# Patient Record
Sex: Male | Born: 1965 | Race: White | Hispanic: No | Smoking: Never smoker
Health system: Southern US, Community
[De-identification: ages and names within clinical notes are randomized; demographics above are authoritative.]

## PROBLEM LIST (undated history)

## (undated) DIAGNOSIS — Z20828 Contact with and (suspected) exposure to other viral communicable diseases: Secondary | ICD-10-CM

## (undated) DIAGNOSIS — I10 Essential (primary) hypertension: Secondary | ICD-10-CM

## (undated) DIAGNOSIS — L0291 Cutaneous abscess, unspecified: Secondary | ICD-10-CM

## (undated) DIAGNOSIS — E785 Hyperlipidemia, unspecified: Secondary | ICD-10-CM

## (undated) DIAGNOSIS — E119 Type 2 diabetes mellitus without complications: Secondary | ICD-10-CM

## (undated) DIAGNOSIS — R011 Cardiac murmur, unspecified: Secondary | ICD-10-CM

## (undated) HISTORY — PX: WRIST SURGERY: SHX841

## (undated) HISTORY — DX: Hyperlipidemia, unspecified: E78.5

## (undated) HISTORY — DX: Cutaneous abscess, unspecified: L02.91

## (undated) HISTORY — DX: Essential (primary) hypertension: I10

## (undated) HISTORY — DX: Contact with and (suspected) exposure to other viral communicable diseases: Z20.828

---

## 2015-10-03 ENCOUNTER — Encounter (HOSPITAL_COMMUNITY): Payer: Self-pay | Admitting: *Deleted

## 2015-10-03 ENCOUNTER — Emergency Department (HOSPITAL_COMMUNITY)
Admission: EM | Admit: 2015-10-03 | Discharge: 2015-10-03 | Disposition: A | Discharge status: Home / Self Care | Payer: Self-pay | Attending: Emergency Medicine | Admitting: Emergency Medicine

## 2015-10-03 DIAGNOSIS — B029 Zoster without complications: Secondary | ICD-10-CM | POA: Insufficient documentation

## 2015-10-03 DIAGNOSIS — M542 Cervicalgia: Secondary | ICD-10-CM | POA: Insufficient documentation

## 2015-10-03 MED ORDER — OXYCODONE-ACETAMINOPHEN 5-325 MG PO TABS: 1.0000 | ORAL_TABLET | Freq: Four times a day (QID) | ORAL | Status: DC | PRN

## 2015-10-03 MED ORDER — ACYCLOVIR 800 MG PO TABS: 800.0000 mg | ORAL_TABLET | Freq: Every day | ORAL | Status: DC

## 2015-10-03 NOTE — Discharge Instructions (Signed)

## 2015-10-03 NOTE — ED Notes (Signed)
PT reports  Out break started several day ago. Located on posterior neck.

## 2015-10-03 NOTE — ED Provider Notes (Signed)
CSN: [id number]     Arrival date & time 10/03/15  [id number] History  By signing my name below, I, Placido SouLogan Joldersma, attest that this documentation has been prepared under the direction and in the presence of Teressa LowerVrinda Caedon Bond, NP. Electronically Signed: Placido SouLogan Joldersma, ED Scribe. 10/03/2015. 10:15 AM.   Chief Complaint  Patient presents with  . Herpes Zoster   The history is provided by the patient and the spouse. No language interpreter was used.    HPI Comments: Jeremy Padilla is a 49 y.o. male who presents to the Emergency Department complaining of a worsening point of swelling and irritation to his posterior scalp with onset a few days ago. He notes some associated, moderate, pain to the affected region that radiates down his neck that worsens with palpation. Pt also notes some associated trouble sleeping due to the pain. He notes applying topical calamine lotion which has provided little relief. Pt notes that he hasn't been to the doctor for a long time and is unsure of any other known medical conditions. He denies any other associated symptoms.   No past medical history on file. No past surgical history on file. No family history on file. Social History  Substance Use Topics  . Smoking status: Not on file  . Smokeless tobacco: Not on file  . Alcohol Use: Not on file    Review of Systems  Musculoskeletal: Positive for myalgias and neck pain.  Skin: Positive for color change and rash.  Psychiatric/Behavioral: Positive for sleep disturbance.  All other systems reviewed and are negative.  Allergies  Review of patient's allergies indicates not on file.  Home Medications   Prior to Admission medications   Not on File   There were no vitals taken for this visit. Physical Exam  Constitutional: He is oriented to person, place, and time. He appears well-developed and well-nourished.  HENT:  Head: Normocephalic and atraumatic.  Mouth/Throat: No oropharyngeal exudate.  Neck:  Normal range of motion. No tracheal deviation present.  Cardiovascular: Normal rate.   Pulmonary/Chest: Effort normal and breath sounds normal. No respiratory distress.  Abdominal: Soft. There is no tenderness.  Musculoskeletal: Normal range of motion.  Neurological: He is alert and oriented to person, place, and time. He exhibits normal muscle tone. Coordination normal.  Skin: He is not diaphoretic.  Erythematous patch with vesicles noted to the left neck. Full rom of neck  Psychiatric: He has a normal mood and affect. His behavior is normal.  Nursing note and vitals reviewed.  ED Course  Procedures  COORDINATION OF CARE: 10:12 AM Pt presents today due to a point of swelling and irritation to his posterior scalp. Discussed treatment plan with pt at bedside. Return precautions noted. Pt agreed to plan.  Labs Review Labs Reviewed - No data to display  Imaging Review No results found.   EKG Interpretation None      MDM   Final diagnoses:  Herpes zoster    Rash consistent with shingles. No neck stiffness noted. No drainage noted. Will given oxycodone and acyclovir. Pt given return precautions  I personally performed the services described in this documentation, which was scribed in my presence. The recorded information has been reviewed and is accurate.    Teressa LowerVrinda Jahson Emanuele, NP 10/03/15 1033  Richardean Canalavid H Yao, MD 10/03/15 1539

## 2015-10-05 ENCOUNTER — Encounter (HOSPITAL_COMMUNITY): Payer: Self-pay | Admitting: Emergency Medicine

## 2015-10-05 ENCOUNTER — Emergency Department (HOSPITAL_COMMUNITY)
Admission: EM | Admit: 2015-10-05 | Discharge: 2015-10-05 | Disposition: A | Discharge status: Home / Self Care | Payer: Self-pay | Attending: Emergency Medicine | Admitting: Emergency Medicine

## 2015-10-05 DIAGNOSIS — R509 Fever, unspecified: Secondary | ICD-10-CM | POA: Insufficient documentation

## 2015-10-05 DIAGNOSIS — L0291 Cutaneous abscess, unspecified: Secondary | ICD-10-CM

## 2015-10-05 DIAGNOSIS — L0211 Cutaneous abscess of neck: Secondary | ICD-10-CM | POA: Insufficient documentation

## 2015-10-05 DIAGNOSIS — Z79899 Other long term (current) drug therapy: Secondary | ICD-10-CM | POA: Insufficient documentation

## 2015-10-05 DIAGNOSIS — R011 Cardiac murmur, unspecified: Secondary | ICD-10-CM | POA: Insufficient documentation

## 2015-10-05 DIAGNOSIS — Z880 Allergy status to penicillin: Secondary | ICD-10-CM | POA: Insufficient documentation

## 2015-10-05 DIAGNOSIS — R110 Nausea: Secondary | ICD-10-CM | POA: Insufficient documentation

## 2015-10-05 DIAGNOSIS — R000 Tachycardia, unspecified: Secondary | ICD-10-CM | POA: Insufficient documentation

## 2015-10-05 DIAGNOSIS — R61 Generalized hyperhidrosis: Secondary | ICD-10-CM | POA: Insufficient documentation

## 2015-10-05 DIAGNOSIS — Z8619 Personal history of other infectious and parasitic diseases: Secondary | ICD-10-CM | POA: Insufficient documentation

## 2015-10-05 HISTORY — DX: Cutaneous abscess, unspecified: L02.91

## 2015-10-05 LAB — CBC WITH DIFFERENTIAL/PLATELET
BASOS ABS: 0 10*3/uL (ref 0.0–0.1)
BASOS PCT: 0 %
EOS PCT: 0 %
Eosinophils Absolute: 0 10*3/uL (ref 0.0–0.7)
HEMATOCRIT: 46.2 % (ref 39.0–52.0)
HEMOGLOBIN: 16.4 g/dL (ref 13.0–17.0)
LYMPHS ABS: 1 10*3/uL (ref 0.7–4.0)
LYMPHS PCT: 6 %
MCH: 29.3 pg (ref 26.0–34.0)
MCHC: 35.5 g/dL (ref 30.0–36.0)
MCV: 82.6 fL (ref 78.0–100.0)
MONO ABS: 0.9 10*3/uL (ref 0.1–1.0)
MONOS PCT: 6 %
Neutro Abs: 15 10*3/uL — ABNORMAL HIGH (ref 1.7–7.7)
Neutrophils Relative %: 88 %
Platelets: 195 10*3/uL (ref 150–400)
RBC: 5.59 MIL/uL (ref 4.22–5.81)
RDW: 12.5 % (ref 11.5–15.5)
WBC: 16.9 10*3/uL — AB (ref 4.0–10.5)

## 2015-10-05 LAB — I-STAT CHEM 8, ED
BUN: 20 mg/dL (ref 6–20)
CHLORIDE: 96 mmol/L — AB (ref 101–111)
CREATININE: 0.8 mg/dL (ref 0.61–1.24)
Calcium, Ion: 1.15 mmol/L (ref 1.12–1.23)
GLUCOSE: 264 mg/dL — AB (ref 65–99)
HEMATOCRIT: 52 % (ref 39.0–52.0)
Hemoglobin: 17.7 g/dL — ABNORMAL HIGH (ref 13.0–17.0)
POTASSIUM: 3.9 mmol/L (ref 3.5–5.1)
Sodium: 135 mmol/L (ref 135–145)
TCO2: 24 mmol/L (ref 0–100)

## 2015-10-05 MED ORDER — LIDOCAINE-EPINEPHRINE 1 %-1:100000 IJ SOLN
10.0000 mL | Freq: Once | INTRAMUSCULAR | Status: AC
Start: 2015-10-05 — End: 2015-10-05
  Administered 2015-10-05: 1 mL via INTRADERMAL
  Filled 2015-10-05: qty 1

## 2015-10-05 MED ORDER — SODIUM CHLORIDE 0.9 % IV BOLUS (SEPSIS)
1000.0000 mL | Freq: Once | INTRAVENOUS | Status: AC
  Administered 2015-10-05: 1000 mL via INTRAVENOUS

## 2015-10-05 MED ORDER — ONDANSETRON 4 MG PO TBDP
8.0000 mg | ORAL_TABLET | Freq: Once | ORAL | Status: AC
  Administered 2015-10-05: 8 mg via ORAL
  Filled 2015-10-05: qty 2

## 2015-10-05 MED ORDER — HYDROMORPHONE HCL 1 MG/ML IJ SOLN
1.0000 mg | Freq: Once | INTRAMUSCULAR | Status: AC
  Administered 2015-10-05: 1 mg via INTRAVENOUS
  Filled 2015-10-05: qty 1

## 2015-10-05 MED ORDER — DOXYCYCLINE HYCLATE 100 MG PO TABS: 100.0000 mg | ORAL_TABLET | Freq: Two times a day (BID) | ORAL | Status: DC

## 2015-10-05 MED ORDER — HYDRALAZINE HCL 20 MG/ML IJ SOLN: 10.0000 mg | Freq: Once | INTRAMUSCULAR | Status: DC

## 2015-10-05 MED ORDER — IBUPROFEN 400 MG PO TABS
600.0000 mg | ORAL_TABLET | Freq: Once | ORAL | Status: AC
  Administered 2015-10-05: 600 mg via ORAL
  Filled 2015-10-05 (×2): qty 1

## 2015-10-05 NOTE — Discharge Instructions (Signed)

## 2015-10-05 NOTE — ED Provider Notes (Signed)
CSN: [id number]     Arrival date & time 10/05/15  [id number] History   First MD Initiated Contact with Patient 10/05/15 [phone]     Chief Complaint  Patient presents with  . Herpes Zoster     (Consider location/radiation/quality/duration/timing/severity/associated sxs/prior Treatment) The history is provided by the patient.   Jeremy Padilla is a 49 yo M in with a rash on the posterior aspect of his neck. He was seen in the emergency department a few days ago and diagnosed with herpes zoster. He was started on pain medicine and an antiviral. His symptoms have not improved and pain is gotten worse. He was febrile to 100.5 this morning, having nausea, and worsening pain. The pain is tender to the touch and 7 out of 10. He denies any travel, tobacco use, alcohol use, or illicit drug use. He denies any shortness of breath, chest pain, abdominal pain, dysuria, constipation, or diarrhea.  History reviewed. No pertinent past medical history. Past Surgical History  Procedure Laterality Date  . Wrist surgery Left    No family history on file. Social History  Substance Use Topics  . Smoking status: Never Smoker   . Smokeless tobacco: Never Used  . Alcohol Use: No    Review of Systems  Constitutional: Positive for fever, chills and diaphoresis.  Respiratory: Negative for shortness of breath.   Cardiovascular: Negative for chest pain.  Gastrointestinal: Negative for nausea, vomiting, abdominal pain, diarrhea and constipation.  Genitourinary: Negative for dysuria.  Musculoskeletal: Positive for neck pain.  Skin: Positive for rash.  Psychiatric/Behavioral: Negative for behavioral problems.      Allergies  Penicillins  Home Medications   Prior to Admission medications   Medication Sig Start Date End Date Taking? Authorizing Provider  acyclovir (ZOVIRAX) 800 MG tablet Take 1 tablet (800 mg total) by mouth 5 (five) times daily. 10/03/15   Teressa LowerVrinda Pickering, NP  doxycycline (VIBRA-TABS) 100 MG tablet  Take 1 tablet (100 mg total) by mouth 2 (two) times daily. For total of 7 days. 10/05/15   Myra RudeJeremy E Hamsa Laurich, MD  oxyCODONE-acetaminophen (PERCOCET/ROXICET) 5-325 MG tablet Take 1-2 tablets by mouth every 6 (six) hours as needed for severe pain. 10/03/15   Teressa LowerVrinda Pickering, NP   BP 192/89 mmHg  Pulse 114  Temp(Src) 98.9 F (37.2 C) (Oral)  Resp 19  SpO2 96% Physical Exam  Constitutional: He is oriented to person, place, and time. He appears well-developed and well-nourished.  HENT:  Head: Normocephalic and atraumatic.  Eyes: Conjunctivae and EOM are normal.  Neck: Normal range of motion.  Cardiovascular: Regular rhythm and intact distal pulses.  Tachycardia present.   Murmur heard. Pulmonary/Chest: Breath sounds normal. No respiratory distress. He has no wheezes.  Abdominal: Soft. Bowel sounds are normal. He exhibits no distension. There is no tenderness.  Musculoskeletal: He exhibits no edema.  Neurological: He is alert and oriented to person, place, and time.  Skin: Skin is warm. Rash noted. There is erythema.     Warmth, erythema and tender to touch on the posterior aspect of the neck. Rash crosses the midline. No vesicles or pustules.     ED Course  Procedures (including critical care time) Labs Review Labs Reviewed  CBC WITH DIFFERENTIAL/PLATELET - Abnormal; Notable for the following:    WBC 16.9 (*)    Neutro Abs 15.0 (*)    All other components within normal limits  I-STAT CHEM 8, ED - Abnormal; Notable for the following:    Chloride 96 (*)  Glucose, Bld 264 (*)    Hemoglobin 17.7 (*)    All other components within normal limits    Imaging Review No results found. I have personally reviewed and evaluated these images and lab results as part of my medical decision-making.   EKG Interpretation None      Medications  lidocaine-EPINEPHrine (XYLOCAINE W/EPI) 1 %-1:100000 (with pres) injection 10 mL (not administered)  sodium chloride 0.9 % bolus 1,000 mL (0 mLs  Intravenous Stopped 10/05/15 0903)  ondansetron (ZOFRAN-ODT) disintegrating tablet 8 mg (8 mg Oral Given 10/05/15 0807)  ibuprofen (ADVIL,MOTRIN) tablet 600 mg (600 mg Oral Given 10/05/15 0807)  HYDROmorphone (DILAUDID) injection 1 mg (1 mg Intravenous Given 10/05/15 0838)   Incision and Drainage Procedure Note: Left posterior neck   The affected area was cleaned and draped in a sterile fashion. Anesthesia was achieved using 5 mL of 1% Lidocaine with epinephrine injected around the wound area using a 27-guage 1 inch needle. An 11-blade scalpel was used to incise the wound. A hemostat was used to break any loculations that were present. Iodoform guaze was used to pack the wound. A sterile dressing was applied to the area. The patient tolerated the procedure well. No complications were encountered.    MDM   Final diagnoses:  Abscess    Jeremy Padilla is a 49 yo M that is presenting with abscess. Incision and drainage was performed. Pain improved with medications. Started doxycyline for 7 days. Given indications for return. Advised to establish care with a primary doctor for improvement and evaluation blood pressure and blood sugar.   Myra Rude, MD PGY-3, [facility] 10/05/2015, 9:50 AM      Myra Rude, MD 10/05/15 2956  Gwyneth Sprout, MD 10/05/15 [phone]

## 2015-10-05 NOTE — ED Notes (Signed)
MD at bedside. 

## 2015-10-05 NOTE — ED Notes (Signed)
Pt reports he was seen in the ED Sunday,  Diagnosed with shingles and placed on medication. Patient reports pain has become so bad it is difficult for him to sleep. Pt has swelling to posterior neck around area of rash.

## 2015-10-05 NOTE — ED Notes (Signed)
Placed patient on monitor

## 2015-10-08 ENCOUNTER — Ambulatory Visit
Admission: RE | Admit: 2015-10-08 | Discharge: 2015-10-08 | Disposition: A | Discharge status: Home / Self Care | Payer: Self-pay | Source: Ambulatory Visit | Attending: Family Medicine | Admitting: Family Medicine

## 2015-10-08 ENCOUNTER — Ambulatory Visit (INDEPENDENT_AMBULATORY_CARE_PROVIDER_SITE_OTHER): Payer: Self-pay | Admitting: Family Medicine

## 2015-10-08 ENCOUNTER — Encounter: Payer: Self-pay | Admitting: Family Medicine

## 2015-10-08 ENCOUNTER — Telehealth: Payer: Self-pay | Admitting: Family Medicine

## 2015-10-08 VITALS — BP 192/98 | HR 86 | Temp 97.6°F | Resp 16 | Wt 186.5 lb

## 2015-10-08 DIAGNOSIS — R739 Hyperglycemia, unspecified: Secondary | ICD-10-CM

## 2015-10-08 DIAGNOSIS — IMO0002 Reserved for concepts with insufficient information to code with codable children: Secondary | ICD-10-CM

## 2015-10-08 DIAGNOSIS — L03221 Cellulitis of neck: Secondary | ICD-10-CM | POA: Insufficient documentation

## 2015-10-08 DIAGNOSIS — L0211 Cutaneous abscess of neck: Secondary | ICD-10-CM

## 2015-10-08 DIAGNOSIS — I10 Essential (primary) hypertension: Secondary | ICD-10-CM

## 2015-10-08 DIAGNOSIS — R011 Cardiac murmur, unspecified: Secondary | ICD-10-CM

## 2015-10-08 DIAGNOSIS — E1159 Type 2 diabetes mellitus with other circulatory complications: Secondary | ICD-10-CM | POA: Insufficient documentation

## 2015-10-08 MED ORDER — ONDANSETRON HCL 4 MG PO TABS: 4.0000 mg | ORAL_TABLET | Freq: Three times a day (TID) | ORAL | Status: DC | PRN

## 2015-10-08 MED ORDER — SULFAMETHOXAZOLE-TRIMETHOPRIM 800-160 MG PO TABS
1.0000 | ORAL_TABLET | Freq: Two times a day (BID) | ORAL | Status: DC
Start: 2015-10-08 — End: 2017-07-18

## 2015-10-08 MED ORDER — LISINOPRIL-HYDROCHLOROTHIAZIDE 20-12.5 MG PO TABS
1.0000 | ORAL_TABLET | Freq: Every day | ORAL | Status: DC
Start: 2015-10-08 — End: 2017-08-03

## 2015-10-08 MED ORDER — METFORMIN HCL 500 MG PO TABS: 500.0000 mg | ORAL_TABLET | Freq: Every day | ORAL | Status: DC

## 2015-10-08 NOTE — Progress Notes (Signed)
Name: Jeremy Padilla   MRN: [id number]    DOB: 04/26/1966   Date:10/08/2015       Progress Note  Subjective  Chief Complaint  Chief Complaint  Patient presents with  . Establish Care    HPI  Jeremy Padilla is a 49 y.o. male here today to transition care of medical needs to a primary care provider. He was seen in the ER 10/05/15 with evidence of a large posterior neck abscess with mild surrounding cellulitis. Significant pain, induration, fluctuance and pointing at the time so I&D was performed. Patient initially diagnosed with shingles in the same area 2 days prior to that and was placed on Acyclovir however it progressed to an abcess. Labs at the time indicated leukocytosis as well as blood sugar elevated at 250 which was nonfasting. He was discharged home with antibiotics and pain medication. His blood pressure was high during his ER visit and again today. No neck imaging was done. Jeremy Padilla reports not having seen a regular doctor in many years. He notes poor wound healing and unexpected weight loss without effort over the years. He is accompanied by his wife today who is also my patient and she reports concern that despite being on Doxycycline 100 mg po bid for 3 days now he is still running temps ranging 99-100F. 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   Active Ambulatory Problems    Diagnosis Date Noted  . Abscess or cellulitis, neck 10/08/2015  . Hyperglycemia 10/08/2015   Resolved Ambulatory Problems    Diagnosis Date Noted  . No Resolved Ambulatory Problems   Past Medical History  Diagnosis Date  . Hypertension   . Hyperlipidemia   . Abscess 10/05/15  . 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 Smoking status: Never Smoker   . Smokeless tobacco: Never Used  . Alcohol Use: No     Current outpatient prescriptions:  .  doxycycline (VIBRA-TABS) 100 MG tablet, Take 1 tablet (100 mg total) by mouth 2 (two) times daily. For total of 7 days.,  Disp: 14 tablet, Rfl: 0 .  oxyCODONE-acetaminophen (PERCOCET/ROXICET) 5-325 MG tablet, Take 1-2 tablets by mouth every 6 (six) hours as needed for severe pain., Disp: 15 tablet, Rfl: 0  Past Surgical History  Procedure Laterality Date  . Wrist surgery Left     Family History  Problem Relation Age of Onset  . Heart disease Father   . Hyperlipidemia Father   . Hypertension Father      Allergies  Allergen Reactions  . Penicillins Rash     Review of Systems  CONSTITUTIONAL: No significant weight changes. Yes fever and fatigue. No chills or mental status change HEENT:  - Eyes: No visual changes.  - Ears: No auditory changes. No pain.  - Nose: No sneezing, congestion, runny nose. - Throat: No sore throat. No changes in swallowing. SKIN: Yes infection. CARDIOVASCULAR: No chest pain, chest pressure or chest discomfort. No palpitations or edema.  RESPIRATORY: No shortness of breath, cough or sputum.  GASTROINTESTINAL: No anorexia, nausea, vomiting. No changes in bowel habits. No abdominal pain or blood.  GENITOURINARY: No dysuria. No frequency. No discharge.  NEUROLOGICAL: No headache, dizziness, syncope, paralysis, ataxia, numbness or tingling in the extremities. No memory changes. No change in bowel or bladder control.  MUSCULOSKELETAL: No joint pain. No muscle pain. HEMATOLOGIC: No anemia, bleeding or bruising.  LYMPHATICS: No enlarged lymph nodes.  PSYCHIATRIC: No change in mood. No change in  sleep pattern.  ENDOCRINOLOGIC: No reports of sweating, cold or heat intolerance. No polyuria or polydipsia.     Objective  BP 192/98 mmHg  Pulse 86  Temp(Src) 97.6 F (36.4 C) (Oral)  Resp 16  Wt 186 lb 8 oz (84.596 kg)  SpO2 98% Body mass index is 23.94 kg/(m^2).  Physical Exam  Constitutional: Patient appears well-developed and well-nourished. In no distress.  HEENT:  - Head: Normocephalic and atraumatic.  - Ears: Bilateral TMs gray, no erythema or effusion - Nose:  Nasal mucosa moist - Mouth/Throat: Oropharynx is clear and moist. No tonsillar hypertrophy or erythema. No post nasal drainage.  - Eyes: Conjunctivae clear, EOM movements normal. PERRLA. No scleral icterus.  Neck: Normal range of motion. Neck supple. No JVD present. No thyromegaly present. POSTERIOR aspect with induration and central purulent drainage with packing in place (about 2 inches of packing removed). Area of induration measures about 4cm bordering mid to left side of his hairline.  Cardiovascular: Normal rate, regular rhythm with 3/6 SEM best heard over right of sternum. Pulmonary/Chest: Effort normal and breath sounds normal. No respiratory distress. Musculoskeletal: Normal range of motion bilateral UE and LE, no joint effusions. Peripheral vascular: Bilateral LE no edema. Neurological: CN II-XII grossly intact with no focal deficits. Alert and oriented to person, place, and time. Coordination, balance, strength, speech and gait are normal.  Skin: Skin is warm and dry. No rash noted. No erythema.  Psychiatric: Patient has a normal mood and affect. Behavior is normal in office today. Judgment and thought content normal in office today.   Recent Results (from the past 2160 hour(s))  CBC with Differential     Status: Abnormal   Collection Time: 10/05/15  7:30 AM  Result Value Ref Range   WBC 16.9 (H) 4.0 - 10.5 K/uL   RBC 5.59 4.22 - 5.81 MIL/uL   Hemoglobin 16.4 13.0 - 17.0 g/dL   HCT 40.9 81.1 - 91.4 %   MCV 82.6 78.0 - 100.0 fL   MCH 29.3 26.0 - 34.0 pg   MCHC 35.5 30.0 - 36.0 g/dL   RDW 78.2 95.6 - 21.3 %   Platelets 195 150 - 400 K/uL   Neutrophils Relative % 88 %   Neutro Abs 15.0 (H) 1.7 - 7.7 K/uL   Lymphocytes Relative 6 %   Lymphs Abs 1.0 0.7 - 4.0 K/uL   Monocytes Relative 6 %   Monocytes Absolute 0.9 0.1 - 1.0 K/uL   Eosinophils Relative 0 %   Eosinophils Absolute 0.0 0.0 - 0.7 K/uL   Basophils Relative 0 %   Basophils Absolute 0.0 0.0 - 0.1 K/uL  I-stat Chem 8,  ED     Status: Abnormal   Collection Time: 10/05/15  8:35 AM  Result Value Ref Range   Sodium 135 135 - 145 mmol/L   Potassium 3.9 3.5 - 5.1 mmol/L   Chloride 96 (L) 101 - 111 mmol/L   BUN 20 6 - 20 mg/dL   Creatinine, Ser 0.86 0.61 - 1.24 mg/dL   Glucose, Bld 578 (H) 65 - 99 mg/dL   Calcium, Ion 4.69 6.29 - 1.23 mmol/L   TCO2 24 0 - 100 mmol/L   Hemoglobin 17.7 (H) 13.0 - 17.0 g/dL   HCT 52.8 41.3 - 24.4 %     Assessment & Plan  1. Abscess or cellulitis, neck Packing removed and further drainage expelled with gentle pressure. Area cleaned with alcohol and dressing replaced with guaze and tape. (No further packing). Complicated abscess  with ongoing drainage however the swelling and pain and redness have come down per patient and wife. I am still concerned for osteomyelitis and sepsis potential. I wanted to culture the wound however we have run out of the appropriate culture swabs within my office setting. We decided on double coverage with Bactrim DS, finish Doxy, stop Acyclovir. C-Spine xray did not show any free are with tissue or bone involvement of infection.  Patient given strict precautions to return to ED if fevers persist or her symptoms worsen. Otherwise he will follow up with me in 3-4 days.   - sulfamethoxazole-trimethoprim (BACTRIM DS,SEPTRA DS) 800-160 MG tablet; Take 1 tablet by mouth 2 (two) times daily.  Dispense: 20 tablet; Refill: 0 - ondansetron (ZOFRAN) 4 mg tablet, take 1-2 tablet every 8hrs prn: 30 tablet; Refill: 1 - DG Cervical Spine Complete; Future  2. Hyperglycemia New diagnosis. Although his hyperglycemia could be due to infection I suspect he has an underlying undiagnosed DM II which has led to abscess formation. Plan to start him on Metformin 500 mg po qday and will reassess with HBA1C once abscess resolved.  - metFORMIN (GLUCOPHAGE) 500 MG tablet; Take 1 tablet (500 mg total) by mouth daily.  Dispense: 30 tablet; Refill: 1  3. Hypertension goal BP (blood  pressure) < 140/90 New diagnosis. Very high asymptomatic blood pressure although his cardiac murmur is likely related to his uncontrolled HTN. Clinically no evidence of HTN Urgency. Plan to start him on Zestoretic 20-12.5mg  one a day and will likely need higher doses near future.  - lisinopril-hydrochlorothiazide (ZESTORETIC) 20-12.5 MG tablet; Take 1 tablet by mouth daily.  Dispense: 30 tablet; Refill: 1  4. Heart murmur New finding. Will need stress testing and echo near future.

## 2015-10-08 NOTE — Telephone Encounter (Signed)
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

## 2015-10-08 NOTE — Telephone Encounter (Signed)
Patient is checking status on his xray results

## 2015-10-12 ENCOUNTER — Ambulatory Visit: Payer: Self-pay | Admitting: Family Medicine

## 2017-07-18 ENCOUNTER — Emergency Department: Payer: Self-pay

## 2017-07-18 ENCOUNTER — Encounter: Payer: Self-pay | Admitting: Emergency Medicine

## 2017-07-18 ENCOUNTER — Emergency Department
Admission: EM | Admit: 2017-07-18 | Discharge: 2017-07-18 | Disposition: A | Discharge status: Home / Self Care | Payer: Self-pay | Attending: Emergency Medicine | Admitting: Emergency Medicine

## 2017-07-18 ENCOUNTER — Other Ambulatory Visit: Payer: Self-pay

## 2017-07-18 DIAGNOSIS — I10 Essential (primary) hypertension: Secondary | ICD-10-CM | POA: Insufficient documentation

## 2017-07-18 DIAGNOSIS — Z7984 Long term (current) use of oral hypoglycemic drugs: Secondary | ICD-10-CM | POA: Insufficient documentation

## 2017-07-18 DIAGNOSIS — E119 Type 2 diabetes mellitus without complications: Secondary | ICD-10-CM | POA: Insufficient documentation

## 2017-07-18 DIAGNOSIS — R4182 Altered mental status, unspecified: Secondary | ICD-10-CM | POA: Insufficient documentation

## 2017-07-18 HISTORY — DX: Type 2 diabetes mellitus without complications: E11.9

## 2017-07-18 LAB — BASIC METABOLIC PANEL
ANION GAP: 9 (ref 5–15)
BUN: 36 mg/dL — ABNORMAL HIGH (ref 6–20)
CALCIUM: 10.2 mg/dL (ref 8.9–10.3)
CO2: 30 mmol/L (ref 22–32)
CREATININE: 1.18 mg/dL (ref 0.61–1.24)
Chloride: 97 mmol/L — ABNORMAL LOW (ref 101–111)
GFR calc non Af Amer: >60 mL/min (ref >60)
Glucose, Bld: 222 mg/dL — ABNORMAL HIGH (ref 65–99)
Potassium: 4.5 mmol/L (ref 3.5–5.1)
SODIUM: 136 mmol/L (ref 135–145)

## 2017-07-18 LAB — CBC
HCT: 50.2 % (ref 40.0–52.0)
HEMOGLOBIN: 17.3 g/dL (ref 13.0–18.0)
MCH: 28.9 pg (ref 26.0–34.0)
MCHC: 34.4 g/dL (ref 32.0–36.0)
MCV: 84.1 fL (ref 80.0–100.0)
PLATELETS: 220 10*3/uL (ref 150–440)
RBC: 5.97 MIL/uL — AB (ref 4.40–5.90)
RDW: 13.5 % (ref 11.5–14.5)
WBC: 10 10*3/uL (ref 3.8–10.6)

## 2017-07-18 LAB — TROPONIN I
TROPONIN I: 0.07 ng/mL — AB (ref <0.03)
Troponin I: 0.07 ng/mL (ref <0.03)

## 2017-07-18 NOTE — ED Provider Notes (Signed)
[facility] Emergency Department Provider Note ____________________________________________   I have reviewed the triage vital signs and the triage nursing note.  HISTORY  Chief Complaint Hypertension   Historian Patient  HPI Jeremy Padilla is a 51 y.o. male presents for evaluation of elevated blood pressures. Patient states he had been off of his diabetic and blood pressure medications for a while and restart about 8 days ago. [facility] follows [facility]. He is currently taking lisinopril 20/12.5 HCTZ. He is also taking metformin 500 mg once at night once in the morning.  Patient states that on Monday, 2 days ago he was feeling fine and then he went on the morning and started to feel lightheaded and dizzy and a little confused. He apparently went home and is wife thought that he wasn't quite acting right and his blood pressure has pressure was elevated at 200 over 100s. They did not have a way to check his blood sugar.  It sounds like his symptoms lasted a couple of hours and then resolved. Patient denied slurred speech or trouble finding words, or any focal weakness or numbness. He denied specific coordination problems but felt like he was just fatigued all over.  Denies chest pain. No exertional dyspnea.  Patient states he was supposed to have a primary doctor appointment today but was told to come to the ER due to elevated blood pressures.  He is currently asymptomatic.    Past Medical History:  Diagnosis Date  . Abscess 10/05/15  . Diabetes mellitus without complication (HCC)   . 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 Hyperlipidemia   . Hypertension     Patient Active Problem List   Diagnosis Date Noted  . Abscess or cellulitis, neck 10/08/2015  . Hyperglycemia 10/08/2015  . Hypertension goal BP (blood pressure) < 140/90 10/08/2015  . Heart murmur 10/08/2015    Past Surgical History:  Procedure Laterality Date  . WRIST SURGERY Left     Prior to  Admission medications   Medication Sig Start Date End Date Taking? Authorizing Provider  lisinopril-hydrochlorothiazide (ZESTORETIC) 20-12.5 MG tablet Take 1 tablet by mouth daily. 10/08/15  Yes Edwena FeltySundaram, Ashany, MD  metFORMIN (GLUCOPHAGE) 500 MG tablet Take 1 tablet (500 mg total) by mouth daily. 10/08/15  Yes Edwena FeltySundaram, Ashany, MD  ondansetron (ZOFRAN) 4 MG tablet Take 1-2 tablets (4-8 mg total) by mouth every 8 (eight) hours as needed for nausea or vomiting. Patient not taking: Reported on 07/18/2017 10/08/15   Edwena FeltySundaram, Ashany, MD  oxyCODONE-acetaminophen (PERCOCET/ROXICET) 5-325 MG tablet Take 1-2 tablets by mouth every 6 (six) hours as needed for severe pain. Patient not taking: Reported on 07/18/2017 10/03/15   Teressa LowerPickering, Vrinda, NP    Allergies  Allergen Reactions  . Penicillins Rash    .Has patient had a PCN reaction causing immediate rash, facial/tongue/throat swelling, SOB or lightheadedness with hypotension: Unknown Has patient had a PCN reaction causing severe rash involving mucus membranes or skin necrosis: Unknown Has patient had a PCN reaction that required hospitalization: Unknown Has patient had a PCN reaction occurring within the last 10 years: Unknown If all of the above answers are "NO", then may proceed with Cephalosporin use.     Family History  Problem Relation Age of Onset  . Heart disease Father   . Hyperlipidemia Father   . Hypertension Father     Social History Social History  Substance Use Topics  . Smoking status: Never Smoker  . Smokeless tobacco: Never Used  . Alcohol use No  Review of Systems  Constitutional: Negative for fever. Eyes: Negative for visual changes. ENT: Negative for sore throat. Cardiovascular: Negative for chest pain. Respiratory: Negative for shortness of breath. Gastrointestinal: Negative for abdominal pain, vomiting and diarrhea. Genitourinary: Negative for dysuria. Musculoskeletal: Negative for back pain. Skin: Negative  for rash. Neurological: Negative for headache.  As per history of present illness, somewhat lightheaded and an episode of confusion or altered mental status for couple of hours on Monday, 2 days ago.  ____________________________________________   PHYSICAL EXAM:  VITAL SIGNS: ED Triage Vitals  Enc Vitals Group     BP 07/18/17 1224 (!) 206/112     Pulse Rate 07/18/17 1224 99     Resp 07/18/17 1224 16     Temp 07/18/17 1224 98 F (36.7 C)     Temp Source 07/18/17 1224 Oral     SpO2 07/18/17 1224 100 %     Weight 07/18/17 1223 200 lb (90.7 kg)     Height 07/18/17 1223 6\' 2"  (1.88 m)     Head Circumference --      Peak Flow --      Pain Score --      Pain Loc --      Pain Edu? --      Excl. in GC? --      Constitutional: Alert and oriented. Well appearing and in no distress. HEENT   Head: Normocephalic and atraumatic.      Eyes: Conjunctivae are normal. Pupils equal and round.       Ears:         Nose: No congestion/rhinnorhea.   Mouth/Throat: Mucous membranes are moist.   Neck: No stridor. Cardiovascular/Chest: Normal rate, regular rhythm.  No murmurs, rubs, or gallops. Respiratory: Normal respiratory effort without tachypnea nor retractions. Breath sounds are clear and equal bilaterally. No wheezes/rales/rhonchi. Gastrointestinal: Soft. No distention, no guarding, no rebound. Nontender.    Genitourinary/rectal:Deferred Musculoskeletal: Nontender with normal range of motion in all extremities. No joint effusions.  No lower extremity tenderness.  No edema. Neurologic:  Normal speech and language. No gross or focal neurologic deficits are appreciated. Skin:  Skin is warm, dry and intact. No rash noted. Psychiatric: Mood and affect are normal. Speech and behavior are normal. Patient exhibits appropriate insight and judgment.   ____________________________________________  LABS (pertinent positives/negatives)  Labs Reviewed  BASIC METABOLIC PANEL - Abnormal;  Notable for the following:       Result Value   Chloride 97 (*)    Glucose, Bld 222 (*)    BUN 36 (*)    All other components within normal limits  CBC - Abnormal; Notable for the following:    RBC 5.97 (*)    All other components within normal limits  TROPONIN I - Abnormal; Notable for the following:    Troponin I 0.07 (*)    All other components within normal limits  TROPONIN I - Abnormal; Notable for the following:    Troponin I 0.07 (*)    All other components within normal limits  TROPONIN I    ____________________________________________    EKG I, Governor Rooks, MD, the attending physician have personally viewed and interpreted all ECGs.  92 bpm. Normal sinus rhythm. Left axis deviation. LVH. Q waves septally. T waves inverted laterally with mild ST segment depression. ST segment elevation in V1 and V2 appears to be J-point elevation.  Repeat EKG: 77 bpm. Normal sinus rhythm. Narrow QRS. Normal axis. LVH with lateral changes. ST segment elevation in  V1 and V2. To be J-point elevation. ____________________________________________  RADIOLOGY All Xrays were viewed by me. Imaging interpreted by Radiologist.  Chest x-ray two-view: No edema or consolidation.   CT head without contrast: IMPRESSION: 1.  No evidence for acute intracranial abnormality. 2. 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  __________________________________________  PROCEDURES  Procedure(s) performed: None  Critical Care performed: None  ____________________________________________   ED COURSE / ASSESSMENT AND PLAN  Pertinent labs & imaging results that were available during my care of the patient were reviewed by me and considered in my medical decision making (see chart for details).   Patient is currently a symptomatic, but is describing an episode of several hours of slightly altered in terms of being slightly confused or slow and fatigued and having some  dizziness on Monday.  Although those episodes is nonspecific and could be a number of different presentations from heat exhaustion to hypoglycemia, hypertensive encephalopathy, we discussed the possibility of stroke/TIA.  CT head will be obtained given symptoms greater than 24 hours ago, will rely on CT head for acute findings.  Patient's EKG is nonspecific and has LVH and I think best evaluation appears to be J-point elevation rather than 70.  Troponin came back elevated 0.07. He does have uncontrolled hypertension, and has been on restarted blood pressure and diabetic medications for only about a week now.  Patient was given aspirin. His repeat troponin was reviewed in the computer system after down time with Epic was back online and found to be unchanged at 0.07.  CT head shows not acute, subacute emergent findings.  There are signs of old lacunar infarcts.  I am asking patient to start baby aspirin. I'm asking him to follow-up with cardiology next 1-2 days. Asked him to follow-up with his private care doctor in next 1-2 days to decide whether or not he needs additional evaluation for possible TIA. The episode on Monday although again not a clear TIA, really very nonspecific episode.    CONSULTATIONS:   Cardiology Dr. Okey Dupre, discussed risk factors and ECG and troponin unchanged, and will recommend close outpatient follow up.   Patient / Family / Caregiver informed of clinical course, medical decision-making process, and agree with plan.   I discussed return precautions, follow-up instructions, and discharge instructions with patient and/or family.  Discharge Instructions : You were evaluated for elevated blood pressure, and I would like him to continue medication and discussed changing medication with your primary care doctor.  It is uncertain what the episode was on Monday, however the rest of your exam and evaluation are overall reassuring in the emergency department today. I do want you  to follow-up with a cardiologist this week, please call tomorrow for an appointment.  I would also like you to see your primary doctor - call tomorrow to consider any additional evaluation for TIA (transient ischemic attack).  Please take 81mg  (baby) aspirin daily.  Return to the emergency room immediately for any chest pain, nausea or sweats, confusion altered mental status, weakness, numbness, or any other symptoms concerning to you.  ___________________________________________   FINAL CLINICAL IMPRESSION(S) / ED DIAGNOSES   Final diagnoses:  Hypertension, unspecified type  Altered mental status, unspecified altered mental status type              Note: This dictation was prepared with Dragon dictation. Any transcriptional errors that result from this process are unintentional    Governor Rooks, MD 07/18/17 [phone]

## 2017-07-18 NOTE — ED Triage Notes (Signed)
C/O elevated blood pressure for more than a week.   Patient states he was started on BP meds and Diabetes medications over a year ago.  Patient had not been taking meds, but restarted taking medications 2 weeks, but blood pressure remains high.  Taking lisinopril 20-12.5 dialy.  And Metformin 500 mg QID.

## 2017-07-18 NOTE — Discharge Instructions (Signed)
You were evaluated for elevated blood pressure, and I would like him to continue medication and discussed changing medication with your primary care doctor.  It is uncertain what the episode was on Monday, however the rest of your exam and evaluation are overall reassuring in the emergency department today. I do want you to follow-up with a cardiologist this week, please call tomorrow for an appointment.  I would also like you to see your primary doctor - call tomorrow to consider any additional evaluation for TIA (transient ischemic attack).  Please take 81mg  (baby) aspirin daily.  Return to the emergency room immediately for any chest pain, nausea or sweats, confusion altered mental status, weakness, numbness, or any other symptoms concerning to you.

## 2017-07-18 NOTE — ED Notes (Signed)
md in with pt.   Pt alert.  D/c inst to pt.

## 2017-07-18 NOTE — ED Notes (Signed)
Pt to CT

## 2017-08-02 DIAGNOSIS — E1139 Type 2 diabetes mellitus with other diabetic ophthalmic complication: Secondary | ICD-10-CM | POA: Insufficient documentation

## 2017-08-02 DIAGNOSIS — E118 Type 2 diabetes mellitus with unspecified complications: Secondary | ICD-10-CM | POA: Insufficient documentation

## 2017-08-02 NOTE — Progress Notes (Signed)
Cardiology Office Note  Date:  08/03/2017   ID:  [name], DOB [birth year], MRN [id number]  PCP:  Patient, No Pcp Per   Chief Complaint  Patient presents with  . other    Follow up from [facility] ER; HTN. Meds reviewed by the pt. verbally. "doing well." Pt. c/o tingling and numbness in feet at times. Denies chest pain or shortness of breath.     HPI: ./l Jeremy Padilla is a 51 y.o. male hx of  Diabetes, poorly controlled for years HTN, poorly controlled  seen in the emergency room last week for confusion found to have severely elevated blood pressure Presents by referral from the emergency room for management of his hypertension  He does not have insurance, No meds for one year at least, off and on over many years Noncompliant in general Poor diet, drinks lots of suite tea, trying to do better  Currently feels well with no complaints  Seen in the ER 07/18/2017 Hospital records reviewed with the patient in detail CT head shows not acute, subacute emergent findings.  There are signs of old lacunar infarcts.  He is seen in [place] to clinic At time of discharge from ER started on lisinopril HCTZ Now back on his metformin  Recently reported feeling lightheaded and dizzy and a little confused. He apparently went home and is wife thought that he wasn't quite acting right and his blood pressure has pressure was elevated at 200 over 100s. They did not have a way to check his blood sugar. It sounds like his symptoms lasted a couple of hours and then resolved. Patient denied slurred speech or trouble finding words, or any focal weakness or numbness.   Denies chest pain. No exertional dyspnea.  On today's visit he is taking lisinopril HCTZ, metformin, Lipitor  EKG personally reviewed by myself on todays visit Shows normal sinus rhythm rate 78 bpm T wave abnormality V5, V6 1 and aVL concerning for ischemia lateral wall  PMH:   has a past medical history of Abscess  (10/05/15); Diabetes mellitus without complication (HCC); Herpes exposure; Hyperlipidemia; and Hypertension.  PSH:    Past Surgical History:  Procedure Laterality Date  . WRIST SURGERY Left     Current Outpatient Prescriptions  Medication Sig Dispense Refill  . aspirin EC 81 MG tablet Take 81 mg by mouth daily.    Marland Kitchen atorvastatin (LIPITOR) 40 MG tablet Take 1 tablet (40 mg total) by mouth daily. 90 tablet 3  . lisinopril-hydrochlorothiazide (PRINZIDE,ZESTORETIC) 20-25 MG tablet Take 1 tablet by mouth daily. 90 tablet 3  . metFORMIN (GLUCOPHAGE) 500 MG tablet Take 1 tablet (500 mg total) by mouth daily. 30 tablet 1  . amLODipine (NORVASC) 10 MG tablet Take 1 tablet (10 mg total) by mouth daily. 90 tablet 4   No current facility-administered medications for this visit.      Allergies:   Penicillins   Social History:  The patient  reports that he has never smoked. He has never used smokeless tobacco. He reports that he does not drink alcohol or use drugs.   Family History:   family history includes Heart disease in his father; Hyperlipidemia in his father; Hypertension in his father.    Review of Systems: Review of Systems  Constitutional: Negative.   Respiratory: Negative.   Cardiovascular: Negative.   Gastrointestinal: Negative.   Musculoskeletal: Negative.   Neurological: Negative.   Psychiatric/Behavioral: Negative.   All other systems reviewed and are negative.    PHYSICAL EXAM:  VS:  BP (!) 160/80 (BP Location: Right Arm, Patient Position: Sitting, Cuff Size: Normal)   Pulse 78   Ht 6\' 2"  (1.88 m)   Wt 192 lb 8 oz (87.3 kg)   BMI 24.72 kg/m  , BMI Body mass index is 24.72 kg/m. GEN: Well nourished, well developed, in no acute distress  HEENT: normal  Neck: no JVD, 1/2 left carotid bruit, no masses Cardiac: RRR; 2/6 SEM RSB, no rubs, or gallops,no edema  Respiratory:  clear to auscultation bilaterally, normal work of breathing GI: soft, nontender, nondistended, +  BS MS: no deformity or atrophy  Skin: warm and dry, no rash Neuro:  Strength and sensation are intact Psych: euthymic mood, full affect    Recent Labs: 07/18/2017: BUN 36; Creatinine, Ser 1.18; Hemoglobin 17.3; Platelets 220; Potassium 4.5; Sodium 136    Lipid Panel No results found for: CHOL, HDL, LDLCALC, TRIG    Wt Readings from Last 3 Encounters:  08/03/17 192 lb 8 oz (87.3 kg)  07/18/17 200 lb (90.7 kg)  10/08/15 186 lb 8 oz (84.6 kg)       ASSESSMENT AND PLAN:  Hypertension goal BP (blood pressure) < 140/90 Stressed importance of aggressive blood pressure control We will start amlodipine 10 mg daily in addition to his lisinopril HCT He will need basic metabolic panel with primary care  Heart murmur - Plan: EKG 12-Lead He does not have insurance, low-grade murmur Recommended when he does get insurance we order echocardiogram  Type 2 diabetes mellitus with complication, without long-term current use of insulin (HCC) Stressed importance of medication compliance In the past has been noncompliant, now with neuropathy in his feet  Bruit of left carotid artery Will need carotid ultrasound He would like to wait as he has no insurance  Lacunar infarction [facility]) CT scan showing old lacunar infarcts Possibly from long history of severe hypertension uncontrolled Ideally needs carotid ultrasound but has no insurance Stressed importance of aggressive blood pressure control  Abnormal EKG EKG concerning for ischemia lateral wall He does not want stress testing at this time, reports that he feels fine He does not have insurance  Disposition:   F/U  6 months   Total encounter time more than 45 minutes  Greater than 50% was spent in counseling and coordination of care with the patient    Orders Placed This Encounter  Procedures  . EKG 12-Lead     Signed, Dossie Arbour, M.D., Ph.D. 08/03/2017  [facility] [place], [place] [phone]

## 2017-08-03 ENCOUNTER — Ambulatory Visit (INDEPENDENT_AMBULATORY_CARE_PROVIDER_SITE_OTHER): Payer: Self-pay | Admitting: Cardiovascular Disease

## 2017-08-03 ENCOUNTER — Encounter: Payer: Self-pay | Admitting: Cardiovascular Disease

## 2017-08-03 VITALS — BP 160/80 | HR 78 | Ht 74.0 in | Wt 192.5 lb

## 2017-08-03 DIAGNOSIS — R0989 Other specified symptoms and signs involving the circulatory and respiratory systems: Secondary | ICD-10-CM

## 2017-08-03 DIAGNOSIS — E118 Type 2 diabetes mellitus with unspecified complications: Secondary | ICD-10-CM

## 2017-08-03 DIAGNOSIS — R9431 Abnormal electrocardiogram [ECG] [EKG]: Secondary | ICD-10-CM

## 2017-08-03 DIAGNOSIS — R011 Cardiac murmur, unspecified: Secondary | ICD-10-CM

## 2017-08-03 DIAGNOSIS — I639 Cerebral infarction, unspecified: Secondary | ICD-10-CM

## 2017-08-03 DIAGNOSIS — I10 Essential (primary) hypertension: Secondary | ICD-10-CM

## 2017-08-03 DIAGNOSIS — I6381 Other cerebral infarction due to occlusion or stenosis of small artery: Secondary | ICD-10-CM

## 2017-08-03 MED ORDER — ATORVASTATIN CALCIUM 40 MG PO TABS: 40.0000 mg | ORAL_TABLET | Freq: Every day | ORAL | 3 refills | Status: DC

## 2017-08-03 MED ORDER — AMLODIPINE BESYLATE 10 MG PO TABS: 10.0000 mg | ORAL_TABLET | Freq: Every day | ORAL | 4 refills | Status: DC

## 2017-08-03 MED ORDER — LISINOPRIL-HYDROCHLOROTHIAZIDE 20-25 MG PO TABS: 1.0000 | ORAL_TABLET | Freq: Every day | ORAL | 3 refills | Status: DC

## 2017-08-03 NOTE — Patient Instructions (Signed)
Medication Instructions:   Please start amlodipine one a day  Ask about : Amlodipine/valsartan/HCTZ combo pill  Labwork:  No new labs needed  Testing/Procedures:  No further testing at this time   Follow-Up: It was a pleasure seeing you in the office today. Please call us if you have new issues that need to be addressed before your next appt.  [phone]  Your physician wants you to follow-up in: 6 months.  You will receive a reminder letter in the mail two months in advance. If you don't receive a letter, please call our office to schedule the follow-up appointment.  If you need a refill on your cardiac medications before your next appointment, please call your pharmacy.

## 2018-07-24 IMAGING — CT CT HEAD W/O CM
3 series · 14 of 47 positions shown, 16 images · non-contrast
Comparison: None.

CLINICAL DATA: Patient C/O elevated blood pressure for more than a
week. Patient states he was started on BP meds and Diabetes
restarted taking medications 2 weeks, but blood pressure remains
high.

EXAM:
CT HEAD WITHOUT CONTRAST
TECHNIQUE: Contiguous axial images were obtained from the base of the skull
through the vertex without intravenous contrast.

[Series 2: head wo · axial · 0.47mm/px · z∈[-135,-10]mm · 8 of 31 slices shown, 10 images]
[im 3/31  brain]
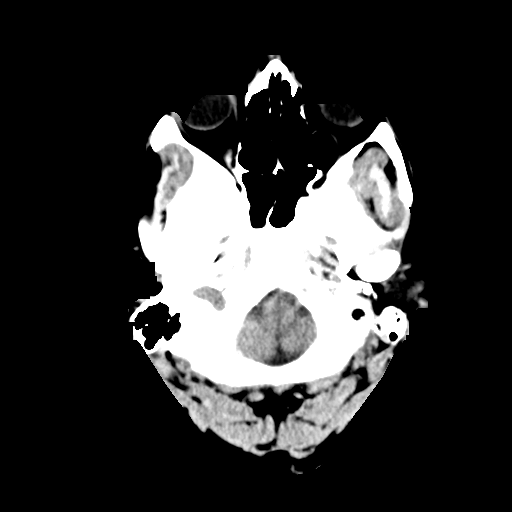
[im 3/31  bone]
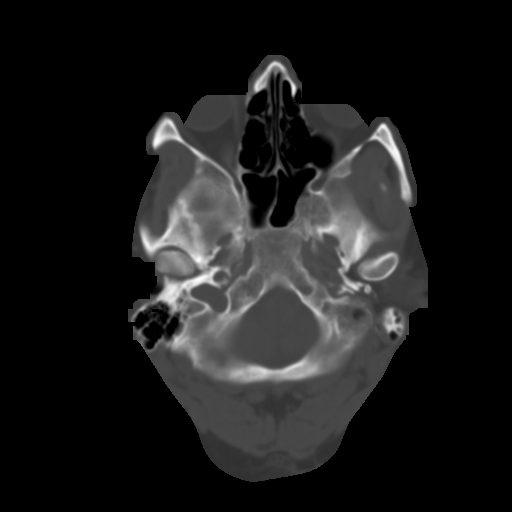
[im 7/31  brain]
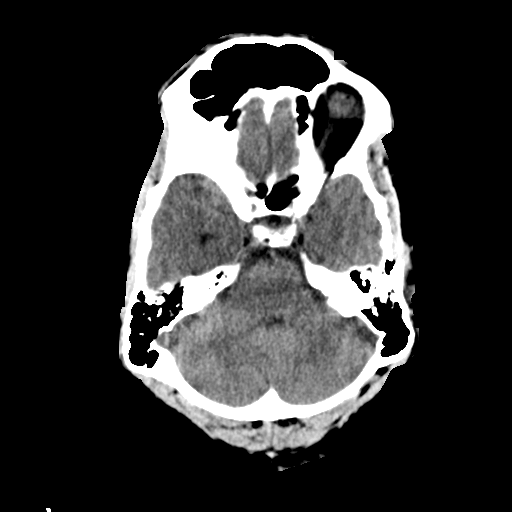
[im 10/31  brain]
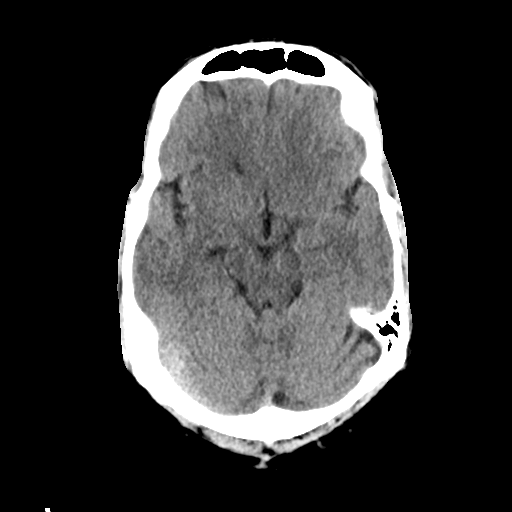
[im 14/31  brain]
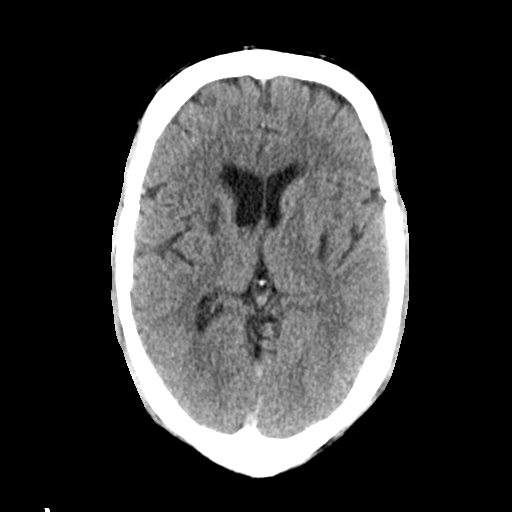
[im 17/31  brain]
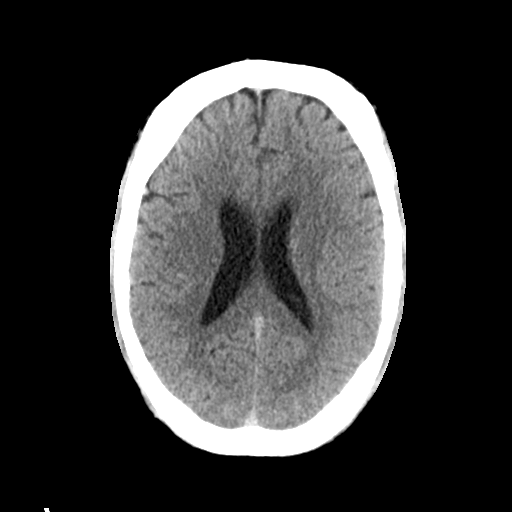
[im 17/31  bone]
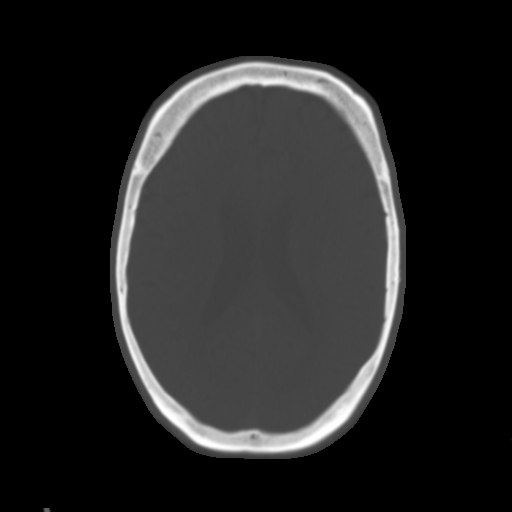
[im 21/31  brain]
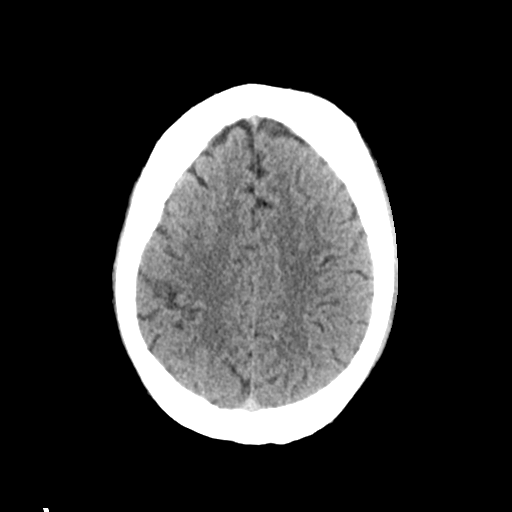
[im 24/31  brain]
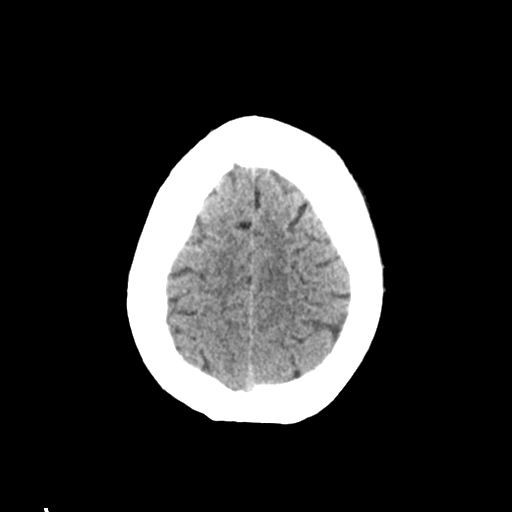
[im 28/31  brain]
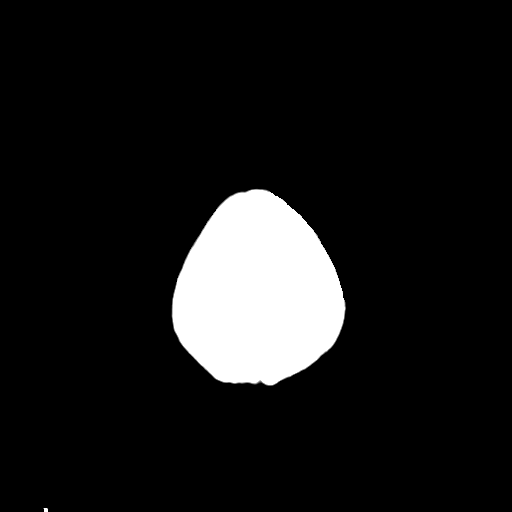

[Series 4: coronal soft tissue · coronal · 0.31mm/px · 3 of 65 slices shown]
[im 22/65  brain]
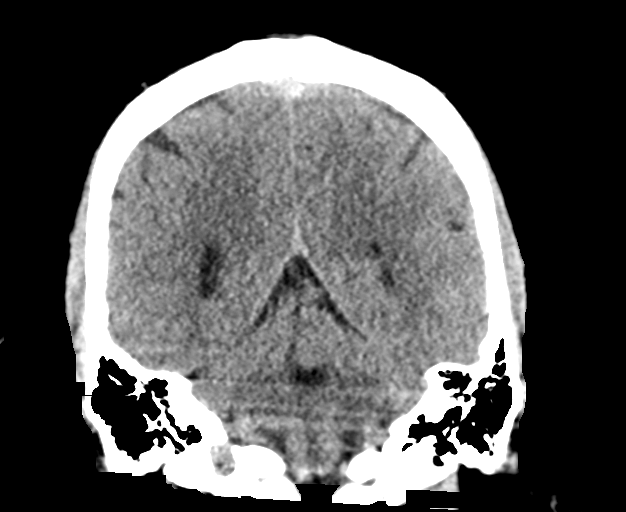
[im 29/65  brain]
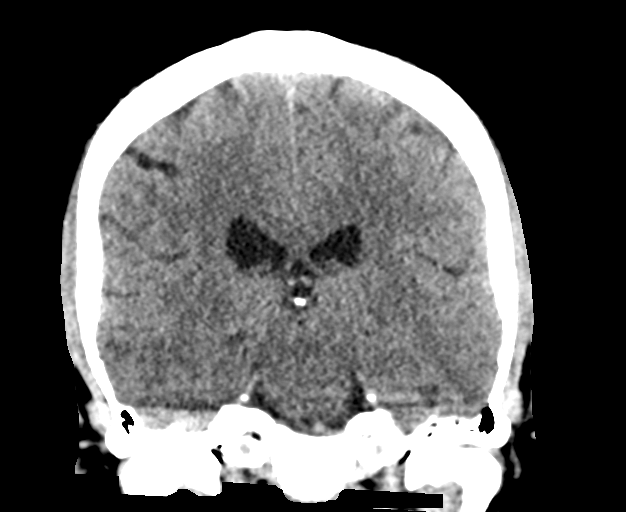
[im 36/65  brain]
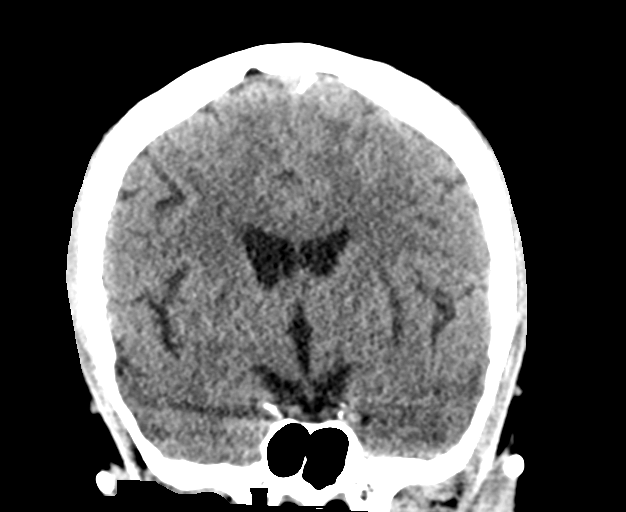

[Series 5: sagittal soft tissue · sagittal · 0.29mm/px · 3 of 51 slices shown]
[im 17/51  brain]
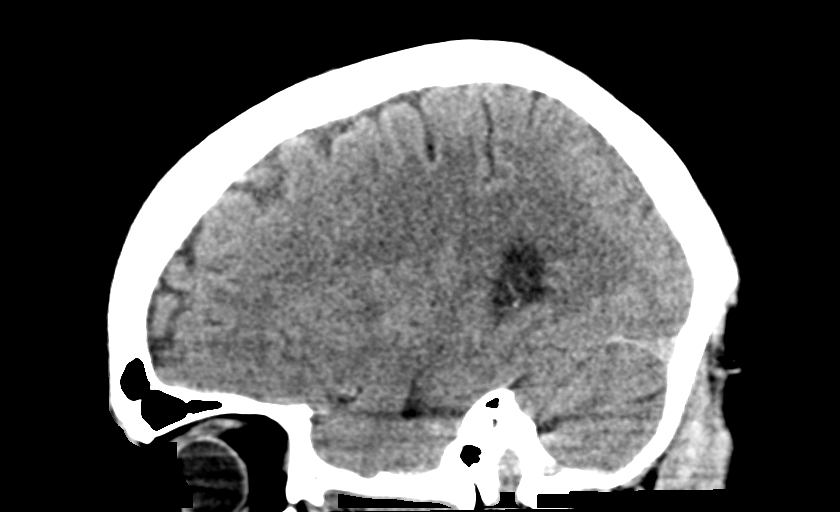
[im 26/51  brain]
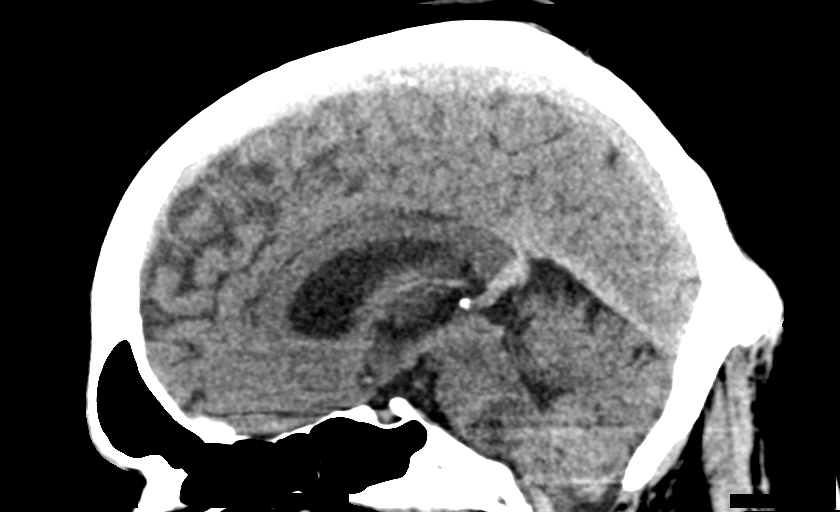
[im 34/51  brain]
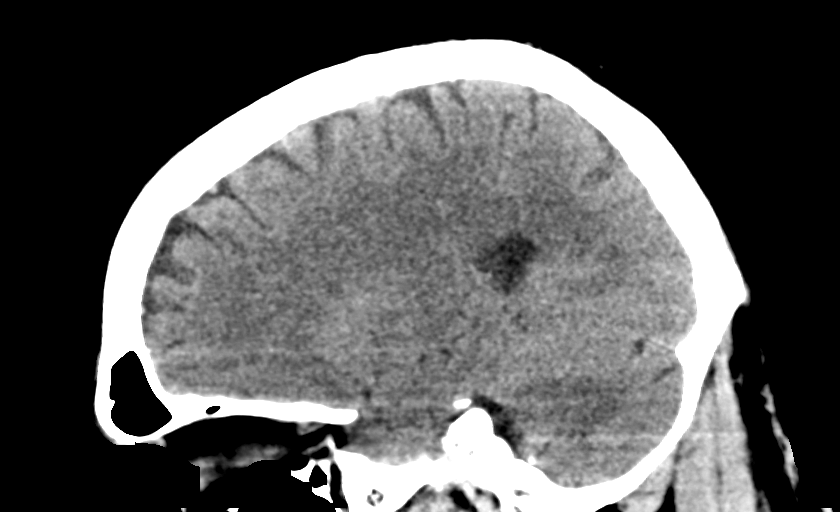

[14 of 47 positions shown; findings below may reference images not displayed]

FINDINGS: Brain: There small remote lacunar infarcts within the basal ganglia
bilaterally. There is no intra or extra-axial fluid collection or
mass lesion. The basilar cisterns and ventricles have a normal
appearance. There is no CT evidence for acute infarction or
hemorrhage.

Vascular: No hyperdense vessel or unexpected calcification.

Skull: Normal. Negative for fracture or focal lesion.

Sinuses/Orbits: No acute finding.

Other: None
IMPRESSION: 1.  No evidence for acute intracranial abnormality.
2. Small bilateral remote lacunar infarcts in basal ganglia
bilaterally.

## 2018-07-24 IMAGING — CR DG CHEST 2V
2 series · 2 of 2 positions shown · non-contrast
Comparison: None.

CLINICAL DATA: Hypertension

EXAM:
CHEST  2 VIEW

[chest pa]
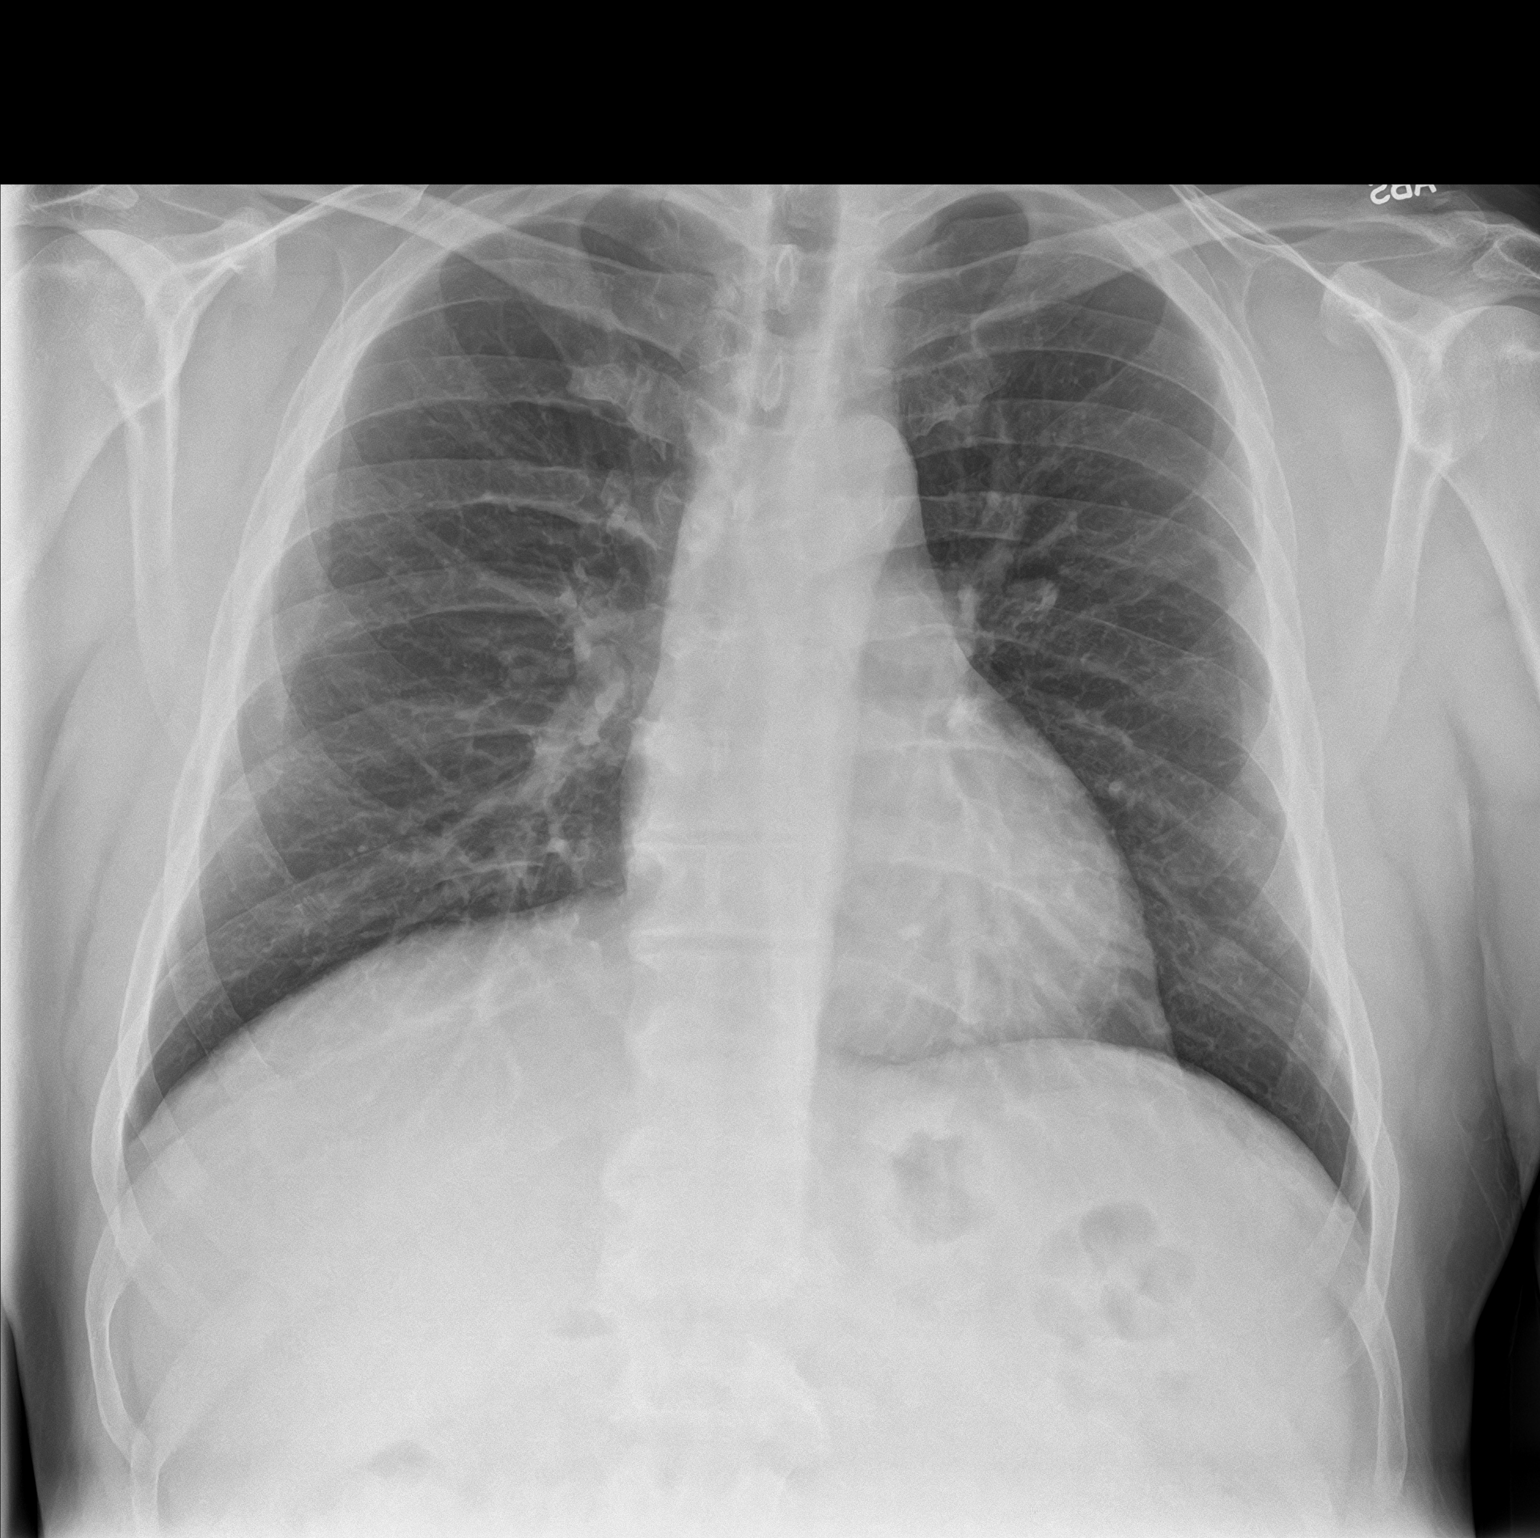

[chest lat]
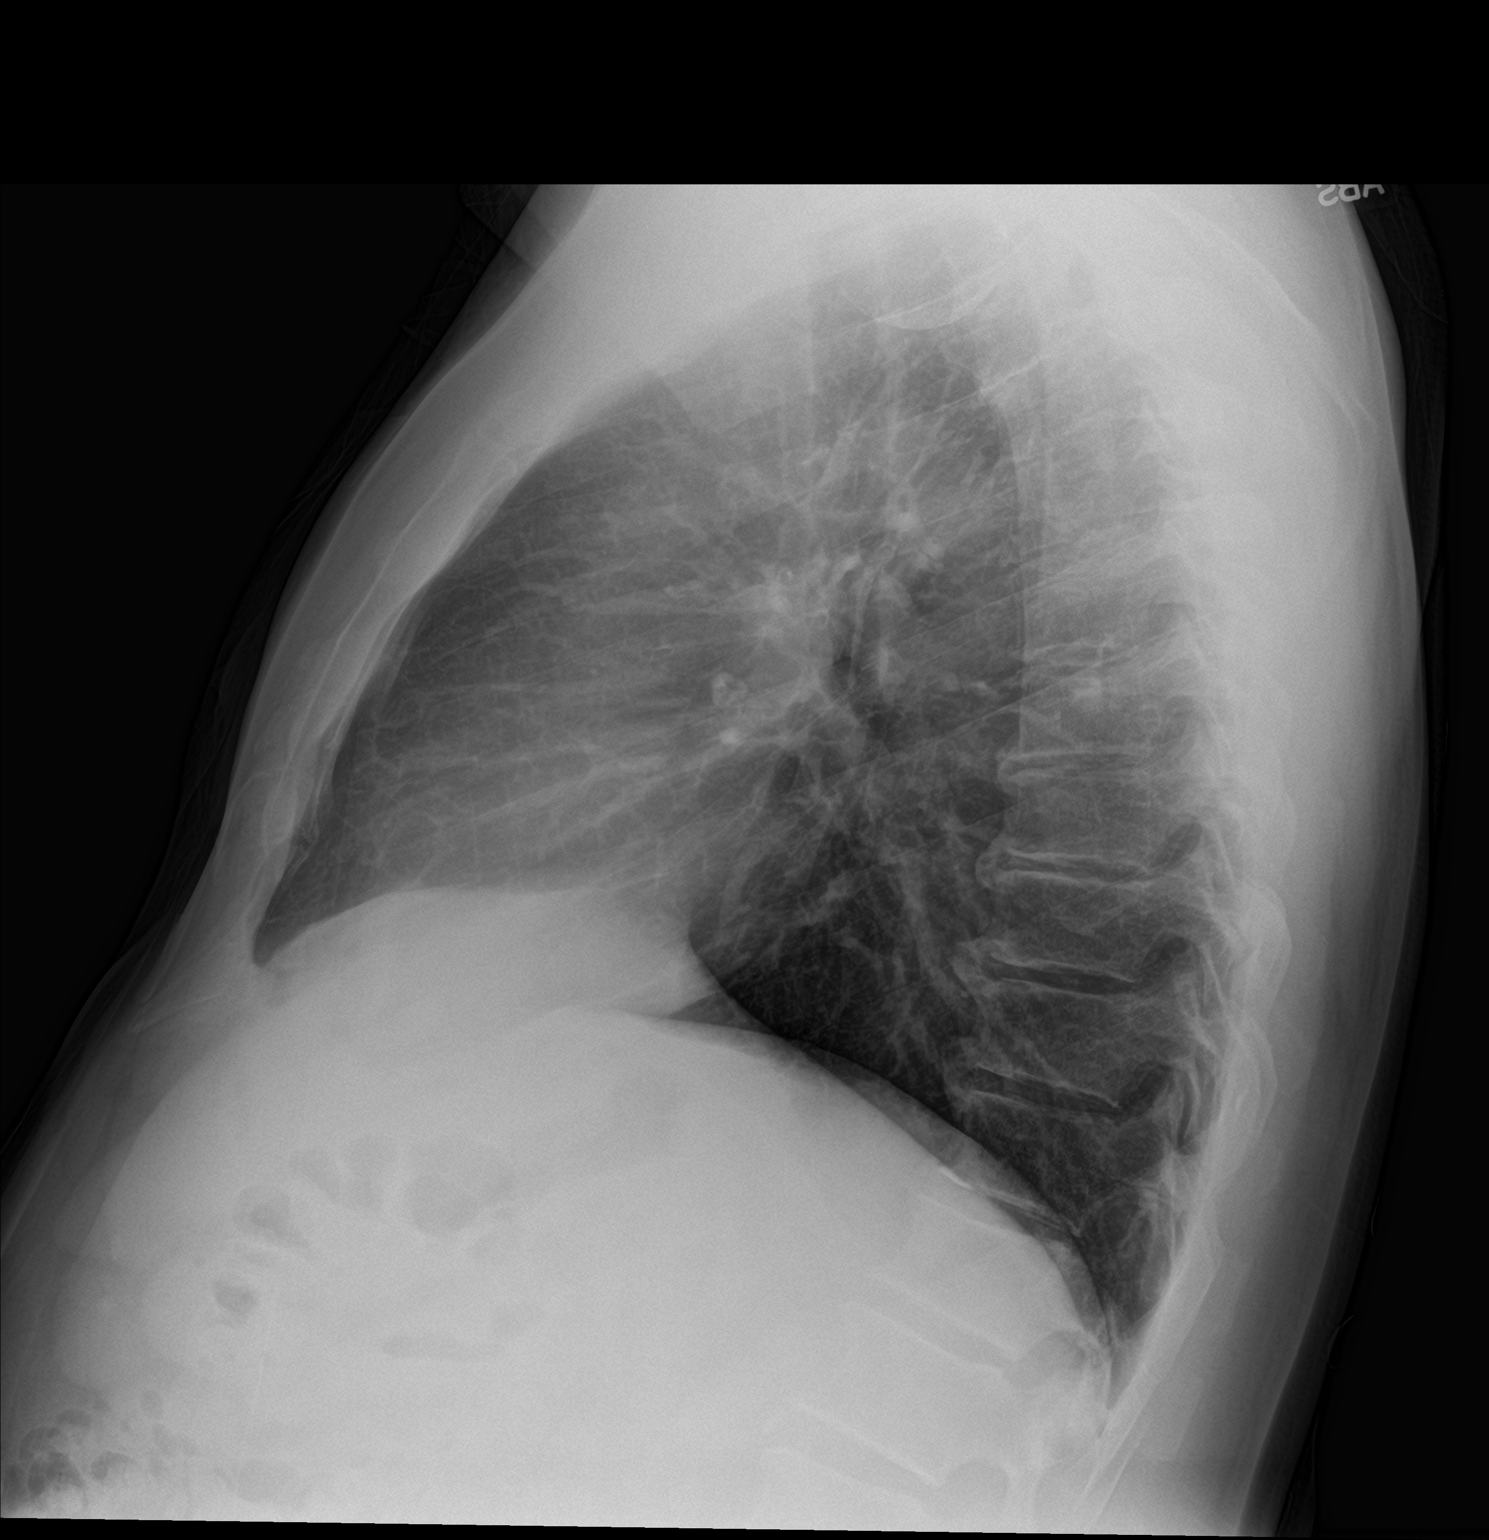

[2 of 2 positions shown; findings below may reference images not displayed]

FINDINGS: Lungs are clear. Heart size and pulmonary vascularity are normal. No
adenopathy. No bone lesions.
IMPRESSION: No edema or consolidation.

## 2019-04-07 ENCOUNTER — Other Ambulatory Visit: Payer: Self-pay

## 2019-04-07 ENCOUNTER — Emergency Department
Admission: EM | Admit: 2019-04-07 | Discharge: 2019-04-07 | Disposition: A | Discharge status: Home / Self Care | Payer: Self-pay | Attending: Student in an Organized Health Care Education/Training Program | Admitting: Student in an Organized Health Care Education/Training Program

## 2019-04-07 ENCOUNTER — Emergency Department: Payer: Self-pay

## 2019-04-07 DIAGNOSIS — Z79899 Other long term (current) drug therapy: Secondary | ICD-10-CM | POA: Insufficient documentation

## 2019-04-07 DIAGNOSIS — I10 Essential (primary) hypertension: Secondary | ICD-10-CM | POA: Insufficient documentation

## 2019-04-07 DIAGNOSIS — E785 Hyperlipidemia, unspecified: Secondary | ICD-10-CM | POA: Insufficient documentation

## 2019-04-07 DIAGNOSIS — I739 Peripheral vascular disease, unspecified: Secondary | ICD-10-CM | POA: Insufficient documentation

## 2019-04-07 DIAGNOSIS — E119 Type 2 diabetes mellitus without complications: Secondary | ICD-10-CM | POA: Insufficient documentation

## 2019-04-07 LAB — CBC
HCT: 46.1 % (ref 39.0–52.0)
Hemoglobin: 15.6 g/dL (ref 13.0–17.0)
MCH: 28.3 pg (ref 26.0–34.0)
MCHC: 33.8 g/dL (ref 30.0–36.0)
MCV: 83.7 fL (ref 80.0–100.0)
Platelets: 231 10*3/uL (ref 150–400)
RBC: 5.51 MIL/uL (ref 4.22–5.81)
RDW: 12.3 % (ref 11.5–15.5)
WBC: 9.9 10*3/uL (ref 4.0–10.5)
nRBC: 0 % (ref 0.0–0.2)

## 2019-04-07 LAB — BASIC METABOLIC PANEL
Anion gap: 10 (ref 5–15)
BUN: 33 mg/dL — ABNORMAL HIGH (ref 6–20)
CO2: 27 mmol/L (ref 22–32)
Calcium: 9.5 mg/dL (ref 8.9–10.3)
Chloride: 101 mmol/L (ref 98–111)
Creatinine, Ser: 1.37 mg/dL — ABNORMAL HIGH (ref 0.61–1.24)
GFR calc Af Amer: >60 mL/min (ref >60)
GFR calc non Af Amer: 58 mL/min — ABNORMAL LOW (ref >60)
Glucose, Bld: 197 mg/dL — ABNORMAL HIGH (ref 70–99)
Potassium: 4.5 mmol/L (ref 3.5–5.1)
Sodium: 138 mmol/L (ref 135–145)

## 2019-04-07 LAB — PROTIME-INR
INR: 1 (ref 0.8–1.2)
Prothrombin Time: 12.9 seconds (ref 11.4–15.2)

## 2019-04-07 MED ORDER — CLOPIDOGREL BISULFATE 75 MG PO TABS: 75.0000 mg | ORAL_TABLET | Freq: Every day | ORAL | 0 refills | Status: DC

## 2019-04-07 MED ORDER — CLOPIDOGREL BISULFATE 75 MG PO TABS
75.0000 mg | ORAL_TABLET | Freq: Once | ORAL | Status: AC
  Administered 2019-04-07: 16:00:00 75 mg via ORAL
  Filled 2019-04-07: qty 1

## 2019-04-07 NOTE — ED Triage Notes (Signed)
Pt sent from PCP for possible DVT, pt c/o left leg pain with swelling and redness, PCP told pt there is noted temperature change and feels there is concern for a clot. Denies injury. Denies hx of blood clots in the past.

## 2019-04-07 NOTE — ED Provider Notes (Signed)
[facility] Emergency Department Provider Note  ____________________________________________  Time seen: Approximately 1:21 PM  I have reviewed the triage vital signs and the nursing notes.   HISTORY  Chief Complaint Leg Pain    HPI Jeremy Padilla is a 53 y.o. male with past medical history diabetes and hypertension that presents to the emergency department for evaluation of left leg pain for several weeks.  He states that pain started cramping in his calf.  1 week ago he started with numbness to his toes.  Patient states that he went to his primary care doctor this morning and recommended that he come to the emergency department for evaluation.  Primary care was concerned that his foot felt cool.  He does not smoke.  He takes aspirin daily.  He is compliant with his blood pressure and diabetes medications.  No swelling.  Past Medical History:  Diagnosis Date  . Abscess 10/05/15  . Diabetes mellitus without complication (HCC)   . 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 Hyperlipidemia   . Hypertension     Patient Active Problem List   Diagnosis Date Noted  . Lacunar infarction (HCC) 08/03/2017  . Bruit of left carotid artery 08/03/2017  . Type 2 diabetes mellitus with complication, without long-term current use of insulin (HCC) 08/02/2017  . Abscess or cellulitis, neck 10/08/2015  . Hyperglycemia 10/08/2015  . Hypertension goal BP (blood pressure) < 140/90 10/08/2015  . Heart murmur 10/08/2015    Past Surgical History:  Procedure Laterality Date  . WRIST SURGERY Left     Prior to Admission medications   Medication Sig Start Date End Date Taking? Authorizing Provider  amLODipine (NORVASC) 10 MG tablet Take 1 tablet (10 mg total) by mouth daily. 08/03/17   Antonieta Iba, MD  aspirin EC 81 MG tablet Take 81 mg by mouth daily.    [provider]  atorvastatin (LIPITOR) 40 MG tablet Take 1 tablet (40 mg total) by mouth daily. 08/03/17   Antonieta Iba,  MD  clopidogrel (PLAVIX) 75 MG tablet Take 1 tablet (75 mg total) by mouth daily. 04/07/19 04/06/20  Enid Derry, PA-C  lisinopril-hydrochlorothiazide (PRINZIDE,ZESTORETIC) 20-25 MG tablet Take 1 tablet by mouth daily. 08/03/17   Antonieta Iba, MD  metFORMIN (GLUCOPHAGE) 500 MG tablet Take 1 tablet (500 mg total) by mouth daily. 10/08/15   Edwena Felty, MD    Allergies Penicillins  Family History  Problem Relation Age of Onset  . Heart disease Father   . Hyperlipidemia Father   . Hypertension Father     Social History Social History   Tobacco Use  . Smoking status: Never Smoker  . Smokeless tobacco: Never Used  Substance Use Topics  . Alcohol use: No  . Drug use: No     Review of Systems  Constitutional: No fever/chills Cardiovascular: No chest pain. Respiratory: No SOB. Gastrointestinal: No nausea, no vomiting.  Musculoskeletal: Positive for leg pain. Skin: Negative for rash, abrasions, lacerations, ecchymosis. Neurological: Negative for headaches. Positive for numbness.   ____________________________________________   PHYSICAL EXAM:  VITAL SIGNS: ED Triage Vitals  Enc Vitals Group     BP 04/07/19 1236 (!) 148/100     Pulse Rate 04/07/19 1236 (!) 107     Resp --      Temp 04/07/19 1236 98.6 F (37 C)     Temp Source 04/07/19 1236 Oral     SpO2 04/07/19 1236 98 %     Weight 04/07/19 1235 191 lb (86.6 kg)  Height 04/07/19 1235 6\' 2"  (1.88 m)     Head Circumference --      Peak Flow --      Pain Score --      Pain Loc --      Pain Edu? --      Excl. in GC? --      Constitutional: Alert and oriented. Well appearing and in no acute distress. Eyes: Conjunctivae are normal. PERRL. EOMI. Head: Atraumatic. ENT:      Ears:      Nose: No congestion/rhinnorhea.      Mouth/Throat: Mucous membranes are moist.  Neck: No stridor.   Cardiovascular: Normal rate, regular rhythm.  Decreased palpable left pedal pulses. Unable to obtain pulses with  doppler to left foot. Respiratory: Normal respiratory effort without tachypnea or retractions. Lungs CTAB. Good air entry to the bases with no decreased or absent breath sounds. Musculoskeletal: Full range of motion to all extremities. No gross deformities appreciated.  Full range of motion of left toes and left foot. Neurologic:  Normal speech and language. No gross focal neurologic deficits are appreciated.  Skin:  Skin is warm, dry and intact.  No wounds.  No ecchymosis.  Left foot is slightly cooler to touch than right foot.  Able to feel light sensation to all 5 toes of left foot. Psychiatric: Mood and affect are normal. Speech and behavior are normal. Patient exhibits appropriate insight and judgement.   ____________________________________________   LABS (all labs ordered are listed, but only abnormal results are displayed)  Labs Reviewed  BASIC METABOLIC PANEL - Abnormal; Notable for the following components:      Result Value   Glucose, Bld 197 (*)    BUN 33 (*)    Creatinine, Ser 1.37 (*)    GFR calc non Af Amer 58 (*)    All other components within normal limits  CBC  PROTIME-INR   ____________________________________________  EKG  SR ____________________________________________  RADIOLOGY   US Venous Img Lower Unilateral Left  Result Date: 04/07/2019 CLINICAL DATA:  53 year old male with left leg swelling for the past couple weeks EXAM: LEFT LOWER EXTREMITY VENOUS DOPPLER ULTRASOUND TECHNIQUE: Gray-scale sonography with graded compression, as well as color Doppler and duplex ultrasound were performed to evaluate the lower extremity deep venous systems from the level of the common femoral vein and including the common femoral, femoral, profunda femoral, popliteal and calf veins including the posterior tibial, peroneal and gastrocnemius veins when visible. The superficial great saphenous vein was also interrogated. Spectral Doppler was utilized to evaluate flow at rest  and with distal augmentation maneuvers in the common femoral, femoral and popliteal veins. COMPARISON:  None. FINDINGS: Contralateral Common Femoral Vein: Respiratory phasicity is normal and symmetric with the symptomatic side. No evidence of thrombus. Normal compressibility. Common Femoral Vein: No evidence of thrombus. Normal compressibility, respiratory phasicity and response to augmentation. Saphenofemoral Junction: No evidence of thrombus. Normal compressibility and flow on color Doppler imaging. Profunda Femoral Vein: No evidence of thrombus. Normal compressibility and flow on color Doppler imaging. Femoral Vein: No evidence of thrombus. Normal compressibility, respiratory phasicity and response to augmentation. Popliteal Vein: No evidence of thrombus. Normal compressibility, respiratory phasicity and response to augmentation. Calf Veins: No evidence of thrombus. Normal compressibility and flow on color Doppler imaging. Superficial Great Saphenous Vein: No evidence of thrombus. Normal compressibility. Venous Reflux:  None. Other Findings:  None. IMPRESSION: No evidence of deep venous thrombosis. Electronically Signed   By: Isac Caddy.D.  On: 04/07/2019 13:44    ____________________________________________    PROCEDURES  Procedure(s) performed:    Procedures    Medications  clopidogrel (PLAVIX) tablet 75 mg (75 mg Oral Given 04/07/19 1620)     ____________________________________________   INITIAL IMPRESSION / ASSESSMENT AND PLAN / ED COURSE  Pertinent labs & imaging results that were available during my care of the patient were reviewed by me and considered in my medical decision making (see chart for details).  Review of the [place] CSRS was performed in accordance of the [facility] prior to dispensing any controlled drugs.   Patient's diagnosis is consistent with peripheral artery disease.  Vital signs and exam are reassuring.  Dr. Roxan Hockey was consulted and came to see the  patient and to attempt to obtain doppler pulses and to evaluate for occlusion.  Dr. Wyn Quaker was consulted and recommends either admission to the hospital with heparin drip or discharged with Plavix prescription and following up with him in clinic tomorrow.  Patient does not want to stay in the hospital and would like to go home.  He is agreeable with calling Dr. Driscilla Grammes office in the morning for an appointment tomorrow.  He was given a dose of Plavix in the emergency department.  Dr. Roxan Hockey is agreeable with this plan.  Patient will be discharged home with prescriptions for Plavix. Patient is to follow up with vascular surgery as directed. Patient is given ED precautions to return to the ED for any worsening or new symptoms.     ____________________________________________  FINAL CLINICAL IMPRESSION(S) / ED DIAGNOSES  Final diagnoses:  Peripheral artery disease (HCC)      NEW MEDICATIONS STARTED DURING THIS VISIT:  ED Discharge Orders         Ordered    clopidogrel (PLAVIX) 75 MG tablet  Daily     04/07/19 1607              This chart was dictated using voice recognition software/Dragon. Despite best efforts to proofread, errors can occur which can change the meaning. Any change was purely unintentional.    Enid Derry, PA-C 04/07/19 1628    Willy Eddy, MD 04/07/19 1630

## 2019-04-07 NOTE — ED Notes (Signed)
See triage note  Presents with pain and swelling to left leg  States he has not had any injury  Skin warm to touch

## 2019-04-07 NOTE — Discharge Instructions (Signed)
You have decreased blood flow to your left foot.  Please call Dr. Driscilla Grammes office for an appointment tomorrow.  He is expecting you.

## 2019-04-07 NOTE — ED Notes (Signed)
Pedal pulses checked   Faint pulses felt but unable to hear to doppler  Provider made aware

## 2019-04-07 NOTE — ED Notes (Signed)
Dr Roxan Hockey in with pt at present

## 2019-04-08 ENCOUNTER — Telehealth (INDEPENDENT_AMBULATORY_CARE_PROVIDER_SITE_OTHER): Payer: Self-pay | Admitting: Vascular Surgery

## 2019-04-08 ENCOUNTER — Other Ambulatory Visit: Payer: Self-pay

## 2019-04-08 ENCOUNTER — Ambulatory Visit (INDEPENDENT_AMBULATORY_CARE_PROVIDER_SITE_OTHER): Payer: Self-pay | Admitting: Vascular Surgery

## 2019-04-08 ENCOUNTER — Other Ambulatory Visit (INDEPENDENT_AMBULATORY_CARE_PROVIDER_SITE_OTHER): Payer: Self-pay | Admitting: Nurse Practitioner

## 2019-04-08 ENCOUNTER — Ambulatory Visit (INDEPENDENT_AMBULATORY_CARE_PROVIDER_SITE_OTHER): Payer: Self-pay

## 2019-04-08 ENCOUNTER — Encounter (INDEPENDENT_AMBULATORY_CARE_PROVIDER_SITE_OTHER): Payer: Self-pay | Admitting: Vascular Surgery

## 2019-04-08 ENCOUNTER — Other Ambulatory Visit (INDEPENDENT_AMBULATORY_CARE_PROVIDER_SITE_OTHER): Payer: Self-pay | Admitting: Vascular Surgery

## 2019-04-08 ENCOUNTER — Telehealth (INDEPENDENT_AMBULATORY_CARE_PROVIDER_SITE_OTHER): Payer: Self-pay

## 2019-04-08 VITALS — BP 173/81 | HR 88 | Resp 18 | Ht 74.0 in | Wt 190.0 lb

## 2019-04-08 DIAGNOSIS — M79606 Pain in leg, unspecified: Secondary | ICD-10-CM

## 2019-04-08 DIAGNOSIS — I10 Essential (primary) hypertension: Secondary | ICD-10-CM

## 2019-04-08 DIAGNOSIS — I70222 Atherosclerosis of native arteries of extremities with rest pain, left leg: Secondary | ICD-10-CM

## 2019-04-08 DIAGNOSIS — I739 Peripheral vascular disease, unspecified: Secondary | ICD-10-CM | POA: Insufficient documentation

## 2019-04-08 DIAGNOSIS — E118 Type 2 diabetes mellitus with unspecified complications: Secondary | ICD-10-CM

## 2019-04-08 DIAGNOSIS — I70229 Atherosclerosis of native arteries of extremities with rest pain, unspecified extremity: Secondary | ICD-10-CM | POA: Insufficient documentation

## 2019-04-08 MED ORDER — CLINDAMYCIN PHOSPHATE 300 MG/50ML IV SOLN: 300.0000 mg | Freq: Once | INTRAVENOUS | Status: DC

## 2019-04-08 NOTE — Assessment & Plan Note (Signed)
Recommend:  The patient has evidence of severe atherosclerotic changes of both lower extremities with rest pain that is associated with preulcerative changes and impending tissue loss of the foot.  This represents a limb threatening ischemia and places the patient at the risk for limb loss.  This has also come up somewhat acutely suggesting a more acute on chronic situation that started 2 to 3 weeks ago. Noninvasive studies performed today to evaluate his perfusion show marked reduction in flow bilaterally.  His right ABI is 0.54 and his left ABI is 0.22.  Monophasic waveforms are present.  Patient should undergo angiography of the left lower extremity with the hope for intervention for limb salvage.  The risks and benefits as well as the alternative therapies was discussed in detail with the patient.  All questions were answered.  Patient agrees to proceed with angiography.  The patient will follow up with me in the office after the procedure.

## 2019-04-08 NOTE — Assessment & Plan Note (Signed)
blood pressure control important in reducing the progression of atherosclerotic disease. On appropriate oral medications.  

## 2019-04-08 NOTE — Telephone Encounter (Signed)
Patient has been schedule.

## 2019-04-08 NOTE — Patient Instructions (Signed)
Peripheral Vascular Disease  Peripheral vascular disease (PVD) is a disease of the blood vessels that are not part of your heart and brain. A simple term for PVD is poor circulation. In most cases, PVD narrows the blood vessels that carry blood from your heart to the rest of your body. This can reduce the supply of blood to your arms, legs, and internal organs, like your stomach or kidneys. However, PVD most often affects a person's lower legs and feet. Without treatment, PVD tends to get worse. PVD can also lead to acute ischemic limb. This is when an arm or leg suddenly cannot get enough blood. This is a medical emergency. Follow these instructions at home: Lifestyle  Do not use any products that contain nicotine or tobacco, such as cigarettes and e-cigarettes. If you need help quitting, ask your doctor.  Lose weight if you are overweight. Or, stay at a healthy weight as told by your doctor.  Eat a diet that is low in fat and cholesterol. If you need help, ask your doctor.  Exercise regularly. Ask your doctor for activities that are right for you. General instructions  Take over-the-counter and prescription medicines only as told by your doctor.  Take good care of your feet: ? Wear comfortable shoes that fit well. ? Check your feet often for any cuts or sores.  Keep all follow-up visits as told by your doctor This is important. Contact a doctor if:  You have cramps in your legs when you walk.  You have leg pain when you are at rest.  You have coldness in a leg or foot.  Your skin changes.  You are unable to get or have an erection (erectile dysfunction).  You have cuts or sores on your feet that do not heal. Get help right away if:  Your arm or leg turns cold, numb, and blue.  Your arms or legs become red, warm, swollen, painful, or numb.  You have chest pain.  You have trouble breathing.  You suddenly have weakness in your face, arm, or leg.  You become very  confused or you cannot speak.  You suddenly have a very bad headache.  You suddenly cannot see. Summary  Peripheral vascular disease (PVD) is a disease of the blood vessels.  A simple term for PVD is poor circulation. Without treatment, PVD tends to get worse.  Treatment may include exercise, low fat and low cholesterol diet, and quitting smoking. This information is not intended to replace advice given to you by your health care provider. Make sure you discuss any questions you have with your health care provider. Document Released: 02/21/2010 Document Revised: 01/04/2017 Document Reviewed: 01/04/2017 Elsevier Interactive Patient Education  2019 [employer]  

## 2019-04-08 NOTE — Telephone Encounter (Signed)
Spoke with the patient and gave him his pre-procedure instructions for his angio on 04/09/2019 with a 2:00pm arrival time. I also explained about the zero visitor policy as well.

## 2019-04-08 NOTE — Assessment & Plan Note (Signed)
blood glucose control important in reducing the progression of atherosclerotic disease. Also, involved in wound healing. On appropriate medications.  

## 2019-04-08 NOTE — Progress Notes (Signed)
Patient ID: Jeremy Padilla, male   DOB: 01/06/1966, 53 y.o.   MRN: [id number]  Chief Complaint  Patient presents with  . Follow-up    HPI Jeremy Padilla is a 53 y.o. male.  I am asked to see the patient by Dr. Roxan Hockeyobinson in the [facility] ER for evaluation of rest pain of the left foot.  The patient reports that 2 to 3 weeks ago, he began having worsening pain in the left foot and lower leg.  Initially, it was more of a cramping with activity consistent with claudication but over the past week or 2 it is pain that keeps him awake at night.  No open ulcerations or infection.  He says his right leg really does not bother him much.  He is clearly a very stoic individual.  He did have pre-existing claudication symptoms in that left leg for weeks to months prior to this worsening.  Noninvasive studies performed today to evaluate his perfusion show marked reduction in flow bilaterally.  His right ABI is 0.54 and his left ABI is 0.22.  Monophasic waveforms are present.   Past Medical History:  Diagnosis Date  . Abscess 10/05/15  . Diabetes mellitus without complication (HCC)   . 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 Hyperlipidemia   . Hypertension     Past Surgical History:  Procedure Laterality Date  . WRIST SURGERY Left     Family History Family History  Problem Relation Age of Onset  . Heart disease Father   . Hyperlipidemia Father   . Hypertension Father   No bleeding disorders, clotting disorders, or autoimmune diseases  Social History Social History   Tobacco Use  . Smoking status: Never Smoker  . Smokeless tobacco: Never Used  Substance Use Topics  . Alcohol use: No  . Drug use: No    Allergies  Allergen Reactions  . Penicillins Rash    .Has patient had a PCN reaction causing immediate rash, facial/tongue/throat swelling, SOB or lightheadedness with hypotension: Unknown Has patient had a PCN reaction causing severe rash involving mucus membranes or skin necrosis: Unknown Has  patient had a PCN reaction that required hospitalization: Unknown Has patient had a PCN reaction occurring within the last 10 years: Unknown If all of the above answers are "NO", then may proceed with Cephalosporin use.     Current Outpatient Medications  Medication Sig Dispense Refill  . amLODipine (NORVASC) 10 MG tablet Take 1 tablet (10 mg total) by mouth daily. 90 tablet 4  . aspirin EC 81 MG tablet Take 81 mg by mouth daily.    Marland Kitchen. atorvastatin (LIPITOR) 40 MG tablet Take 1 tablet (40 mg total) by mouth daily. 90 tablet 3  . clopidogrel (PLAVIX) 75 MG tablet Take 1 tablet (75 mg total) by mouth daily. 30 tablet 0  . linagliptin (TRADJENTA) 5 MG TABS tablet Take 5 mg by mouth daily.    Marland Kitchen. lisinopril-hydrochlorothiazide (PRINZIDE,ZESTORETIC) 20-25 MG tablet Take 1 tablet by mouth daily. 90 tablet 3  . metFORMIN (GLUCOPHAGE) 500 MG tablet Take 1 tablet (500 mg total) by mouth daily. 30 tablet 1   No current facility-administered medications for this visit.       REVIEW OF SYSTEMS (Negative unless checked)  Constitutional: [] Weight loss  [] Fever  [] Chills Cardiac: [] Chest pain   [] Chest pressure   [] Palpitations   [] Shortness of breath when laying flat   [] Shortness of breath at rest   [] Shortness of breath with exertion. Vascular:  [x] Pain in legs  with walking   Pain in legs at rest   Pain in legs when laying flat   Claudication   Pain in feet when walking  Pain in feet at rest  Pain in feet when laying flat   History of DVT   Phlebitis   Swelling in legs   Varicose veins   Non-healing ulcers Pulmonary:   Uses home oxygen   Productive cough   Hemoptysis   Wheeze  COPD   Asthma Neurologic:  Dizziness  Blackouts   Seizures   History of stroke   History of TIA  Aphasia   Temporary blindness   Dysphagia   Weakness or numbness in arms   Weakness or numbness in legs Musculoskeletal:  Arthritis   Joint swelling   Joint pain   Low  back pain Hematologic:  Easy bruising  Easy bleeding   Hypercoagulable state   Anemic  Hepatitis Gastrointestinal:  Blood in stool   Vomiting blood  Gastroesophageal reflux/heartburn   Abdominal pain Genitourinary:  Chronic kidney disease   Difficult urination  Frequent urination  Burning with urination   Hematuria Skin:  Rashes   Ulcers   Wounds Psychological:  History of anxiety    History of major depression.    Physical Exam BP (!) 173/81 (BP Location: Right Arm)   Pulse 88   Resp 18   Ht  (1.88 m)   Wt 190 lb (86.2 kg)   BMI 24.39 kg/m  Gen:  WD/WN, NAD Head: [place]/AT, No temporalis wasting.  Ear/Nose/Throat: Hearing grossly intact, nares w/o erythema or drainage, oropharynx w/o Erythema/Exudate Eyes: Conjunctiva clear, sclera non-icteric  Neck: trachea midline.  No JVD.  Pulmonary:  Good air movement, respirations not labored, no use of accessory muscles  Cardiac: RRR, no JVD Vascular:  Vessel Right Left  Radial Palpable Palpable                          DP 1+ NP  PT NP NP   Gastrointestinal:. No masses, surgical incisions, or scars. Musculoskeletal: M/S 5/5 throughout. Left foot with sluggish capillary refill.  No deformity or atrophy. No edema. Neurologic: Sensation grossly intact in extremities.  Symmetrical.  Speech is fluent. Motor exam as listed above. Psychiatric: Judgment intact, Mood & affect appropriate for pt's clinical situation. Dermatologic: No rashes or ulcers noted.  No cellulitis or open wounds.    Radiology US Venous Img Lower Unilateral Left  Result Date: 04/07/2019 CLINICAL DATA:  53 year old male with left leg swelling for the past couple weeks EXAM: LEFT LOWER EXTREMITY VENOUS DOPPLER ULTRASOUND TECHNIQUE: Gray-scale sonography with graded compression, as well as color Doppler and duplex ultrasound were performed to evaluate the lower extremity deep venous systems from the level of the common femoral  vein and including the common femoral, femoral, profunda femoral, popliteal and calf veins including the posterior tibial, peroneal and gastrocnemius veins when visible. The superficial great saphenous vein was also interrogated. Spectral Doppler was utilized to evaluate flow at rest and with distal augmentation maneuvers in the common femoral, femoral and popliteal veins. COMPARISON:  None. FINDINGS: Contralateral Common Femoral Vein: Respiratory phasicity is normal and symmetric with the symptomatic side. No evidence of thrombus. Normal compressibility. Common Femoral Vein: No evidence of thrombus. Normal compressibility, respiratory phasicity and response to augmentation. Saphenofemoral Junction: No evidence of thrombus. Normal compressibility and flow on color Doppler imaging. Profunda Femoral Vein: No evidence of thrombus. Normal compressibility and flow on color Doppler imaging.  Femoral Vein: No evidence of thrombus. Normal compressibility, respiratory phasicity and response to augmentation. Popliteal Vein: No evidence of thrombus. Normal compressibility, respiratory phasicity and response to augmentation. Calf Veins: No evidence of thrombus. Normal compressibility and flow on color Doppler imaging. Superficial Great Saphenous Vein: No evidence of thrombus. Normal compressibility. Venous Reflux:  None. Other Findings:  None. IMPRESSION: No evidence of deep venous thrombosis. Electronically Signed   By: Malachy Moan M.D.   On: 04/07/2019 13:44    Labs Recent Results (from the past 2160 hour(s))  CBC     Status: None   Collection Time: 04/07/19  1:45 PM  Result Value Ref Range   WBC 9.9 4.0 - 10.5 K/uL   RBC 5.51 4.22 - 5.81 MIL/uL   Hemoglobin 15.6 13.0 - 17.0 g/dL   HCT 69.6 29.5 - 28.4 %   MCV 83.7 80.0 - 100.0 fL   MCH 28.3 26.0 - 34.0 pg   MCHC 33.8 30.0 - 36.0 g/dL   RDW 13.2 44.0 - 10.2 %   Platelets 231 150 - 400 K/uL   nRBC 0.0 0.0 - 0.2 %    Comment: Performed at [facility], [address] [place], [place] [address]  Basic metabolic panel     Status: Abnormal   Collection Time: 04/07/19  1:45 PM  Result Value Ref Range   Sodium 138 135 - 145 mmol/L   Potassium 4.5 3.5 - 5.1 mmol/L   Chloride 101 98 - 111 mmol/L   CO2 27 22 - 32 mmol/L   Glucose, Bld 197 (H) 70 - 99 mg/dL   BUN 33 (H) 6 - 20 mg/dL   Creatinine, Ser 6.44 (H) 0.61 - 1.24 mg/dL   Calcium 9.5 8.9 - 03.4 mg/dL   GFR calc non Af Amer 58 (L) >60 mL/min   GFR calc Af Amer >60 >60 mL/min   Anion gap 10 5 - 15    Comment: Performed at [facility], [address] [place], [place] [address]  Protime-INR     Status: None   Collection Time: 04/07/19  1:45 PM  Result Value Ref Range   Prothrombin Time 12.9 11.4 - 15.2 seconds   INR 1.0 0.8 - 1.2    Comment: (NOTE) INR goal varies based on device and disease states. Performed at [facility], [address] [place], [place] [address]     Assessment/Plan:  Hypertension goal BP (blood pressure) < 140/90 blood pressure control important in reducing the progression of atherosclerotic disease. On appropriate oral medications.   Type 2 diabetes mellitus with complication, without long-term current use of insulin (HCC) blood glucose control important in reducing the progression of atherosclerotic disease. Also, involved in wound healing. On appropriate medications.   Atherosclerosis of native arteries of extremity with rest pain (HCC) Recommend:  The patient has evidence of severe atherosclerotic changes of both lower extremities with rest pain that is associated with preulcerative changes and impending tissue loss of the foot.  This represents a limb threatening ischemia and places the patient at the risk for limb loss.  This has also come up somewhat acutely suggesting a more acute on chronic situation that started 2 to 3 weeks ago. Noninvasive studies performed today to evaluate his perfusion show marked  reduction in flow bilaterally.  His right ABI is 0.54 and his left ABI is 0.22.  Monophasic waveforms are present.  Patient should undergo angiography of the left lower extremity with the hope for intervention for  limb salvage.  The risks and benefits as well as the alternative therapies was discussed in detail with the patient.  All questions were answered.  Patient agrees to proceed with angiography.  The patient will follow up with me in the office after the procedure.           Festus Barren 04/08/2019, 12:35 PM   This note was created with Dragon medical transcription system.  Any errors from dictation are unintentional.

## 2019-04-09 ENCOUNTER — Other Ambulatory Visit: Payer: Self-pay

## 2019-04-09 ENCOUNTER — Encounter
Admission: RE | Disposition: A | Discharge status: Home / Self Care | Payer: Self-pay | Source: Home / Self Care | Attending: Vascular Surgery

## 2019-04-09 ENCOUNTER — Ambulatory Visit
Admission: RE | Admit: 2019-04-09 | Discharge: 2019-04-09 | Disposition: A | Discharge status: Home / Self Care | Payer: Self-pay | Attending: Vascular Surgery | Admitting: Vascular Surgery

## 2019-04-09 DIAGNOSIS — I70222 Atherosclerosis of native arteries of extremities with rest pain, left leg: Secondary | ICD-10-CM

## 2019-04-09 DIAGNOSIS — Z7902 Long term (current) use of antithrombotics/antiplatelets: Secondary | ICD-10-CM | POA: Insufficient documentation

## 2019-04-09 DIAGNOSIS — Z79899 Other long term (current) drug therapy: Secondary | ICD-10-CM | POA: Insufficient documentation

## 2019-04-09 DIAGNOSIS — Z880 Allergy status to penicillin: Secondary | ICD-10-CM | POA: Insufficient documentation

## 2019-04-09 DIAGNOSIS — Z7984 Long term (current) use of oral hypoglycemic drugs: Secondary | ICD-10-CM | POA: Insufficient documentation

## 2019-04-09 DIAGNOSIS — Z7982 Long term (current) use of aspirin: Secondary | ICD-10-CM | POA: Insufficient documentation

## 2019-04-09 DIAGNOSIS — E119 Type 2 diabetes mellitus without complications: Secondary | ICD-10-CM | POA: Insufficient documentation

## 2019-04-09 DIAGNOSIS — I739 Peripheral vascular disease, unspecified: Secondary | ICD-10-CM

## 2019-04-09 DIAGNOSIS — E785 Hyperlipidemia, unspecified: Secondary | ICD-10-CM | POA: Insufficient documentation

## 2019-04-09 DIAGNOSIS — I7092 Chronic total occlusion of artery of the extremities: Secondary | ICD-10-CM

## 2019-04-09 DIAGNOSIS — I10 Essential (primary) hypertension: Secondary | ICD-10-CM | POA: Insufficient documentation

## 2019-04-09 DIAGNOSIS — Z8249 Family history of ischemic heart disease and other diseases of the circulatory system: Secondary | ICD-10-CM | POA: Insufficient documentation

## 2019-04-09 HISTORY — PX: LOWER EXTREMITY ANGIOGRAPHY: CATH118251

## 2019-04-09 LAB — GLUCOSE, CAPILLARY: Glucose-Capillary: 219 mg/dL — ABNORMAL HIGH (ref 70–99)

## 2019-04-09 SURGERY — LOWER EXTREMITY ANGIOGRAPHY
Anesthesia: Moderate Sedation | Site: Leg Lower | Laterality: Left

## 2019-04-09 MED ORDER — SODIUM CHLORIDE 0.9% FLUSH: 3.0000 mL | Freq: Two times a day (BID) | INTRAVENOUS | Status: DC

## 2019-04-09 MED ORDER — FENTANYL CITRATE (PF) 100 MCG/2ML IJ SOLN
INTRAMUSCULAR | Status: AC
  Filled 2019-04-09: qty 2

## 2019-04-09 MED ORDER — DIPHENHYDRAMINE HCL 50 MG/ML IJ SOLN: 50.0000 mg | Freq: Once | INTRAMUSCULAR | Status: DC | PRN

## 2019-04-09 MED ORDER — LIDOCAINE-EPINEPHRINE (PF) 1 %-1:200000 IJ SOLN
INTRAMUSCULAR | Status: AC
  Filled 2019-04-09: qty 30

## 2019-04-09 MED ORDER — ONDANSETRON HCL 4 MG/2ML IJ SOLN: 4.0000 mg | Freq: Four times a day (QID) | INTRAMUSCULAR | Status: DC | PRN

## 2019-04-09 MED ORDER — HEPARIN SODIUM (PORCINE) 1000 UNIT/ML IJ SOLN
INTRAMUSCULAR | Status: DC | PRN
  Administered 2019-04-09: 5000 [IU] via INTRAVENOUS

## 2019-04-09 MED ORDER — MIDAZOLAM HCL 2 MG/ML PO SYRP: 8.0000 mg | ORAL_SOLUTION | Freq: Once | ORAL | Status: DC | PRN

## 2019-04-09 MED ORDER — HEPARIN SODIUM (PORCINE) 1000 UNIT/ML IJ SOLN
INTRAMUSCULAR | Status: AC
  Filled 2019-04-09: qty 1

## 2019-04-09 MED ORDER — MIDAZOLAM HCL 2 MG/2ML IJ SOLN
INTRAMUSCULAR | Status: AC
  Filled 2019-04-09: qty 2

## 2019-04-09 MED ORDER — NITROGLYCERIN 5 MG/ML IV SOLN
INTRAVENOUS | Status: AC
  Filled 2019-04-09: qty 10

## 2019-04-09 MED ORDER — ACETAMINOPHEN 325 MG PO TABS: 650.0000 mg | ORAL_TABLET | ORAL | Status: DC | PRN

## 2019-04-09 MED ORDER — SODIUM CHLORIDE 0.9 % IV SOLN: INTRAVENOUS | Status: DC

## 2019-04-09 MED ORDER — SODIUM CHLORIDE 0.9 % IV SOLN
INTRAVENOUS | Status: DC
  Administered 2019-04-09: 10:00:00 1000 mL via INTRAVENOUS

## 2019-04-09 MED ORDER — FENTANYL CITRATE (PF) 100 MCG/2ML IJ SOLN
INTRAMUSCULAR | Status: DC | PRN
  Administered 2019-04-09: 25 ug via INTRAVENOUS
  Administered 2019-04-09 (×2): 50 ug via INTRAVENOUS

## 2019-04-09 MED ORDER — SODIUM CHLORIDE FLUSH 0.9 % IV SOLN
INTRAVENOUS | Status: AC
  Filled 2019-04-09: qty 50

## 2019-04-09 MED ORDER — HEPARIN (PORCINE) IN NACL 1000-0.9 UT/500ML-% IV SOLN
INTRAVENOUS | Status: AC
  Filled 2019-04-09: qty 1000

## 2019-04-09 MED ORDER — METHYLPREDNISOLONE SODIUM SUCC 125 MG IJ SOLR: 125.0000 mg | Freq: Once | INTRAMUSCULAR | Status: DC | PRN

## 2019-04-09 MED ORDER — CLINDAMYCIN PHOSPHATE 300 MG/50ML IV SOLN
INTRAVENOUS | Status: AC
  Administered 2019-04-09: 10:00:00
  Filled 2019-04-09: qty 50

## 2019-04-09 MED ORDER — NITROGLYCERIN 1 MG/10 ML FOR IR/CATH LAB
INTRA_ARTERIAL | Status: DC | PRN
  Administered 2019-04-09: 250 ug via INTRA_ARTERIAL

## 2019-04-09 MED ORDER — SODIUM CHLORIDE (PF) 0.9 % IJ SOLN
INTRAMUSCULAR | Status: AC
  Filled 2019-04-09: qty 50

## 2019-04-09 MED ORDER — HYDRALAZINE HCL 20 MG/ML IJ SOLN: 5.0000 mg | INTRAMUSCULAR | Status: DC | PRN

## 2019-04-09 MED ORDER — SODIUM CHLORIDE 0.9 % IV SOLN: 250.0000 mL | INTRAVENOUS | Status: DC | PRN

## 2019-04-09 MED ORDER — FAMOTIDINE 20 MG PO TABS: 40.0000 mg | ORAL_TABLET | Freq: Once | ORAL | Status: DC | PRN

## 2019-04-09 MED ORDER — HYDROMORPHONE HCL 1 MG/ML IJ SOLN: 1.0000 mg | Freq: Once | INTRAMUSCULAR | Status: DC | PRN

## 2019-04-09 MED ORDER — MIDAZOLAM HCL 2 MG/2ML IJ SOLN
INTRAMUSCULAR | Status: DC | PRN
  Administered 2019-04-09 (×2): 2 mg via INTRAVENOUS
  Administered 2019-04-09: 1 mg via INTRAVENOUS

## 2019-04-09 MED ORDER — MIDAZOLAM HCL 5 MG/5ML IJ SOLN
INTRAMUSCULAR | Status: AC
  Filled 2019-04-09: qty 5

## 2019-04-09 MED ORDER — LABETALOL HCL 5 MG/ML IV SOLN: 10.0000 mg | INTRAVENOUS | Status: DC | PRN

## 2019-04-09 MED ORDER — SODIUM CHLORIDE 0.9% FLUSH: 3.0000 mL | INTRAVENOUS | Status: DC | PRN

## 2019-04-09 MED ORDER — DIPHENHYDRAMINE HCL 50 MG/ML IJ SOLN
INTRAMUSCULAR | Status: AC
  Administered 2019-04-09: 11:00:00
  Filled 2019-04-09: qty 1

## 2019-04-09 SURGICAL SUPPLY — 29 items
BALLN LUTONIX 018 5X220X130 (BALLOONS) ×3
BALLN LUTONIX 018 5X300X130 (BALLOONS) ×3
BALLN ULTRVRSE 2.5X300X150 (BALLOONS) ×3
BALLN ULTRVRSE 3X300X150 (BALLOONS) ×2
BALLN ULTRVRSE 3X300X150 OTW (BALLOONS) ×1
BALLN ULTRVRSE 5X150X150 (BALLOONS) ×3
BALLOON LUTONIX 018 5X220X130 (BALLOONS) ×1 IMPLANT
BALLOON LUTONIX 018 5X300X130 (BALLOONS) ×1 IMPLANT
BALLOON ULTRVRSE 2.5X300X150 (BALLOONS) ×1 IMPLANT
BALLOON ULTRVRSE 3X300X150 OTW (BALLOONS) ×1 IMPLANT
BALLOON ULTRVRSE 5X150X150 (BALLOONS) ×1 IMPLANT
CATH BEACON 5 .038 100 VERT TP (CATHETERS) ×3 IMPLANT
CATH CXI SUPP 2.6F 150 ANG (CATHETERS) ×3 IMPLANT
CATH CXI SUPP ANG 4FR 135 (CATHETERS) ×1 IMPLANT
CATH CXI SUPP ANG 4FR 135CM (CATHETERS) ×3
CATH PIG 70CM (CATHETERS) ×3 IMPLANT
DEVICE PRESTO INFLATION (MISCELLANEOUS) ×3 IMPLANT
DEVICE STARCLOSE SE CLOSURE (Vascular Products) ×3 IMPLANT
GLIDEWIRE ADV .035X260CM (WIRE) ×3 IMPLANT
GUIDEWIRE PFTE-COATED .018X300 (WIRE) ×3 IMPLANT
PACK ANGIOGRAPHY (CUSTOM PROCEDURE TRAY) ×3 IMPLANT
SHEATH ANL2 6FRX45 HC (SHEATH) ×3 IMPLANT
SHEATH BRITE TIP 5FRX11 (SHEATH) ×3 IMPLANT
STENT VIABAHN 6X100X120 (Permanent Stent) ×3 IMPLANT
STENT VIABAHN 6X250X120 (Permanent Stent) ×3 IMPLANT
SYR MEDRAD MARK 7 150ML (SYRINGE) ×3 IMPLANT
TUBING CONTRAST HIGH PRESS 72 (TUBING) ×3 IMPLANT
WIRE G V18X300CM (WIRE) ×3 IMPLANT
WIRE J 3MM .035X145CM (WIRE) ×3 IMPLANT

## 2019-04-09 NOTE — H&P (Signed)
[facility] History & Physical Update  The patient was interviewed and re-examined.  The patient's previous History and Physical has been reviewed and is unchanged.  There is no change in the plan of care. We plan to proceed with the scheduled procedure.  Festus Barren, MD  04/09/2019, 8:53 AM

## 2019-04-09 NOTE — Op Note (Signed)
[facility]  Percutaneous Study/Intervention Procedural Note   Date of Surgery: 04/09/2019  Surgeon(s):DEW,JASON    Assistants:none  Pre-operative Diagnosis: PAD with rest pain left foot  Post-operative diagnosis:  Same  Procedure(s) Performed:             1.  Ultrasound guidance for vascular access right femoral artery             2.  Catheter placement into left common femoral artery from right femoral approach             3.  Aortogram and selective left lower extremity angiogram including selective images of the anterior tibial and peroneal arteries             4.  Percutaneous transluminal angioplasty of left peroneal artery and tibioperoneal trunk with 3 mm diameter by 30 cm length angioplasty balloon             5.   Percutaneous transluminal angioplasty of the left SFA and popliteal artery with 5 mm diameter by 30 cm length and 5 mm diameter by 22 cm length Lutonix drug-coated angioplasty balloons as well as a 5 mm diameter by 15 cm length conventional angioplasty balloon  6.  Percutaneous transluminal angioplasty of the left anterior tibial artery with 2.5 mm diameter by 30 cm length angioplasty balloon  7.  Viabahn stent placement x2 to the left SFA and popliteal arteries for multiple areas of greater than 50% residual stenosis after angioplasty.  The first stent was 6 mm in diameter by 25 cm in length and the second was 6 mm in diameter by 10 cm in length             8.  StarClose closure device right femoral artery  EBL: 10 cc  Contrast: 75 cc  Fluoro Time: 20 minutes  Moderate Conscious Sedation Time: approximately 60 minutes using 5 mg of Versed and 125 Mcg of Fentanyl              Indications:  Patient is a 53 y.o.male with rest pain of the left foot. The patient has noninvasive study showing a right ABI of 0.5 and a left ABI of 0.2 consistent with critical limb ischemia on the left.  A basic ABI was done yesterday with no detail imaging to  demonstrate the location of the occlusions. The patient is brought in for angiography for further evaluation and potential treatment.  Due to the limb threatening nature of the situation, angiogram was performed for attempted limb salvage. The patient is aware that if the procedure fails, amputation would be expected.  The patient also understands that even with successful revascularization, amputation may still be required due to the severity of the situation. Risks and benefits are discussed and informed consent is obtained.   Procedure:  The patient was identified and appropriate procedural time out was performed.  The patient was then placed supine on the table and prepped and draped in the usual sterile fashion. Moderate conscious sedation was administered during a face to face encounter with the patient throughout the procedure with my supervision of the RN administering medicines and monitoring the patient's vital signs, pulse oximetry, telemetry and mental status throughout from the start of the procedure until the patient was taken to the recovery room. Ultrasound was used to evaluate the right common femoral artery.  It was patent .  A digital ultrasound image was acquired.  A Seldinger needle was used to access the right  common femoral artery under direct ultrasound guidance and a permanent image was performed.  A 0.035 J wire was advanced without resistance and a 5Fr sheath was placed.  Pigtail catheter was placed into the aorta and an AP aortogram was performed. This demonstrated normal renal arteries and normal aorta and iliac segments without significant stenosis. I then crossed the aortic bifurcation and advanced to the left femoral head in the left common femoral artery. Selective left lower extremity angiogram was then performed. This demonstrated about a 70 to 80% stenosis in the proximal SFA just beyond its origin.  The common femoral artery was patent.  The profunda femoris artery was small  and provided poor collateral blood flow as it has fairly distal disease.  The mid SFA also had stenosis in the 70 to 80% range and then the distal SFA was occluded.  The entire popliteal artery was occluded.  There was faint reconstitution of what appeared to be a peroneal artery which was significantly diseased but was difficult to see due to the low flow.  The posterior tibial artery was not visualized.  There appeared to be a diseased anterior tibial artery as well but this was also not well seen due to the poor flow. It was felt that it was in the patient's best interest to proceed with intervention after these images to avoid a second procedure and a larger amount of contrast and fluoroscopy based off of the findings from the initial angiogram. The patient was systemically heparinized and a 6 [place] Ansell sheath was then placed over the [employer] wire. I then used a Kumpe catheter and the advantage wire to navigate down into the SFA and get into the occlusion.  Tediously, I was able to cross the SFA and popliteal occlusions and initially got down to the tibioperoneal trunk.  Imaging was performed to further elucidate the tibioperoneal trunk and peroneal arteries.  This was done with a Kumpe catheter.  The posterior tibial artery was chronically occluded with no distal reconstitution seen.  The peroneal artery was heavily diseased with greater than 90% stenosis in the proximal to mid segment but then beyond the mid to distal segment was continuous distally.  I then placed a V 18 wire and proceeded with treatment.  A 3 mm diameter by 30 cm length angioplasty balloon was inflated from the mid to distal peroneal artery up through the proximal peroneal artery and tibioperoneal trunk.  This was inflated to 12 atm for 1 minute.  The SFA and popliteal lesions were then addressed.  A 5 mm diameter by 30 cm length Lutonix drug-coated angioplasty balloon was inflated in the proximal and mid SFA and taken up to 8  atm for 1 minute.  A 5 mm diameter by 22 cm length Lutonix drug-coated angioplasty balloon was then used in the popliteal and distal SFA but would not cross the tight lesion at the knee.  This was inflated up to 14 atm for 1 minute.  A conventional 5 mm diameter by 15 cm length angioplasty balloon was then taken in the mid and distal popliteal artery down to the origin of the anterior tibial artery and inflated to 10 atm for 1 minute.  Multiple areas of greater than 50% stenosis remained from the mid to distal SFA down through the above-knee popliteal artery.  The below-knee popliteal artery had less than 30% residual stenosis at stent the proximal SFA.  There was about a 40% stenosis in the tibioperoneal trunk and then several areas  in the 30 to 40% range in the peroneal artery.  Using the Kumpe catheter and a 0.018 advantage wire was able to get into the anterior tibial artery and evaluated.  A selective image in the proximal anterior tibial artery showed the proximal anterior tibial artery to be patent, but it occluded about 12 to 15 cm beyond its origin with reconstitution of the anterior tibial artery just above the ankle that was not well seen on imaging outside of the anterior tibial artery.  Tediously, across this lesion with the advantage wire and a CXI catheter and confirmed flow in the foot.  The V 18 wire was then replaced and a 2.5 mm diameter by 30 cm length angioplasty balloon was inflated twice in the anterior tibial artery from its origin all the way down into the foot to encompass the entirety of the lesion.  These inflations were both about 8 to 10 atm for 1 minute.  Completion imaging of the anterior tibial artery showed less than 30% residual stenosis after angioplasty and the flow was in line to the foot.  There did appear to be some spasm of the distal vessels and this was treated with intra-arterial nitroglycerin.  For the residual disease in the SFA and popliteal arteries I started with a 6  mm diameter by 25 cm length Viabahn stent from the mid popliteal artery up to the mid to distal SFA.  A second stent was required to get the residual lesions and this was 10 mm in length and 6 mm in diameter.  These were postdilated with 5 mm balloons with less than 10% residual stenosis. I elected to terminate the procedure. The sheath was removed and StarClose closure device was deployed in the right femoral artery with excellent hemostatic result. The patient was taken to the recovery room in stable condition having tolerated the procedure well.  Findings:               Aortogram:  This demonstrated normal renal arteries and normal aorta and iliac segments without significant stenosis             Left Lower Extremity:  There was about a 70 to 80% stenosis in the proximal SFA just beyond its origin.  The common femoral artery was patent.  The profunda femoris artery was small and provided poor collateral blood flow as it has fairly distal disease.  The mid SFA also had stenosis in the 70 to 80% range and then the distal SFA was occluded.  The entire popliteal artery was occluded.  There was faint reconstitution of what appeared to be a peroneal artery which was significantly diseased but was difficult to see due to the low flow.  The posterior tibial artery was not visualized.  There appeared to be a diseased anterior tibial artery as well but this was also not well seen due to the poor flow   Disposition: Patient was taken to the recovery room in stable condition having tolerated the procedure well.  Complications: None  Leotis Pain 04/09/2019 11:27 AM   This note was created with Dragon Medical transcription system. Any errors in dictation are purely unintentional.

## 2019-04-10 ENCOUNTER — Encounter: Payer: Self-pay | Admitting: Vascular Surgery

## 2019-04-11 ENCOUNTER — Telehealth (INDEPENDENT_AMBULATORY_CARE_PROVIDER_SITE_OTHER): Payer: Self-pay

## 2019-04-11 NOTE — Telephone Encounter (Signed)
Patient had a procedure on 04/09/2019 and his wife called stating that he is having left ankle spasms and pain,foot swelling,and soreness in he groin area,also the patient has being taking tylenol for pain.I inform the patient wife this is to be expected and he should elevate his foot to decrease swelling. I spoke with Dr Wyn Quaker and he advise that the patient can have Tramadol for pain.I have called inTramadol 50mg  1 tablet every 6 hours as needed with no refills #30.

## 2019-04-22 ENCOUNTER — Telehealth (INDEPENDENT_AMBULATORY_CARE_PROVIDER_SITE_OTHER): Payer: Self-pay | Admitting: Vascular Surgery

## 2019-04-22 NOTE — Telephone Encounter (Signed)
Patient calling stating severe pain in L calf and thigh. Throbbing, unbearably painful, unable to sleep due to extreme pain. Requesting Tramadol.   No discoloration other than redness in toes. No swelling, No numbness, No bruising.   States he is taking tylenol and ibuprofen and they are not touching the pain.   Please advise. AS, CMA

## 2019-04-22 NOTE — Telephone Encounter (Signed)
Patient is aware and medication called in. AS, CMA

## 2019-04-29 ENCOUNTER — Other Ambulatory Visit (INDEPENDENT_AMBULATORY_CARE_PROVIDER_SITE_OTHER): Payer: Self-pay | Admitting: Vascular Surgery

## 2019-04-29 DIAGNOSIS — Z95820 Peripheral vascular angioplasty status with implants and grafts: Secondary | ICD-10-CM

## 2019-04-30 ENCOUNTER — Telehealth: Payer: Self-pay

## 2019-04-30 NOTE — Telephone Encounter (Signed)
Called patient from recall.  No answer. LMOV.  This is the 3rd attempt per recall list.  Will delete recall.   

## 2019-05-02 ENCOUNTER — Other Ambulatory Visit: Payer: Self-pay

## 2019-05-02 ENCOUNTER — Ambulatory Visit (INDEPENDENT_AMBULATORY_CARE_PROVIDER_SITE_OTHER): Payer: Self-pay | Admitting: Nurse Practitioner

## 2019-05-02 ENCOUNTER — Ambulatory Visit (INDEPENDENT_AMBULATORY_CARE_PROVIDER_SITE_OTHER): Payer: Self-pay

## 2019-05-02 ENCOUNTER — Encounter (INDEPENDENT_AMBULATORY_CARE_PROVIDER_SITE_OTHER): Payer: Self-pay | Admitting: Vascular Surgery

## 2019-05-02 VITALS — BP 178/81 | HR 94 | Resp 16 | Ht 74.0 in | Wt 184.0 lb

## 2019-05-02 DIAGNOSIS — Z7984 Long term (current) use of oral hypoglycemic drugs: Secondary | ICD-10-CM

## 2019-05-02 DIAGNOSIS — E1151 Type 2 diabetes mellitus with diabetic peripheral angiopathy without gangrene: Secondary | ICD-10-CM

## 2019-05-02 DIAGNOSIS — I739 Peripheral vascular disease, unspecified: Secondary | ICD-10-CM

## 2019-05-02 DIAGNOSIS — I10 Essential (primary) hypertension: Secondary | ICD-10-CM

## 2019-05-02 DIAGNOSIS — Z95820 Peripheral vascular angioplasty status with implants and grafts: Secondary | ICD-10-CM

## 2019-05-02 DIAGNOSIS — E118 Type 2 diabetes mellitus with unspecified complications: Secondary | ICD-10-CM

## 2019-05-02 DIAGNOSIS — I70222 Atherosclerosis of native arteries of extremities with rest pain, left leg: Secondary | ICD-10-CM

## 2019-05-02 DIAGNOSIS — R739 Hyperglycemia, unspecified: Secondary | ICD-10-CM

## 2019-05-02 DIAGNOSIS — Z79899 Other long term (current) drug therapy: Secondary | ICD-10-CM

## 2019-05-09 ENCOUNTER — Encounter (INDEPENDENT_AMBULATORY_CARE_PROVIDER_SITE_OTHER): Payer: Self-pay | Admitting: Nurse Practitioner

## 2019-05-09 NOTE — Progress Notes (Signed)
SUBJECTIVE:  Patient ID: Jeremy Padilla, male    DOB: 09/20/1966, 53 y.o.   MRN: [id number] Chief Complaint  Patient presents with  . Follow-up    [facility] 3week ABI    HPI  Jeremy Padilla is a 53 y.o. male The patient returns to the office for followup and review of the noninvasive studies. There have been no interval changes in lower extremity symptoms. Some claudication persists in right lower extremity. No interval shortening of the patient's claudication distance or development of rest pain symptoms. No new ulcers or wounds have occurred since the last visit.  Following angiogram of left lower extremity, patient had significant pain and swelling but that has subsided at this point.   There have been no significant changes to the patient's overall health care.  The patient denies amaurosis fugax or recent TIA symptoms. There are no recent neurological changes noted. The patient denies history of DVT, PE or superficial thrombophlebitis. The patient denies recent episodes of angina or shortness of breath.   ABI Rt=0.58 and Lt=1.01  (previous ABI's Rt=0.54 and Lt=0.22) Duplex ultrasound of the right lower extremity reveals strong monophasic tibial artery  Waveforms, while the left lower extremity has triphasic anterior tibial artery waveforms with biphasic posterior tibial artery waveforms.  The left lower extremity has strong toe waveforms.  Past Medical History:  Diagnosis Date  . Abscess 10/05/15  . Diabetes mellitus without complication (HCC)   . 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 Hyperlipidemia   . Hypertension     Past Surgical History:  Procedure Laterality Date  . LOWER EXTREMITY ANGIOGRAPHY Left 04/09/2019   Procedure: LOWER EXTREMITY ANGIOGRAPHY;  Surgeon: Annice Needyew, Jason S, MD;  Location: [facility] INVASIVE CV LAB;  Service: Cardiovascular;  Laterality: Left;  . WRIST SURGERY Left     Social History   Socioeconomic History  . Marital status: Married    Spouse name: Not on file   . Number of children: Not on file  . Years of education: Not on file  . Highest education level: Not on file  Occupational History  . Not on file  Social Needs  . Financial resource strain: Not on file  . Food insecurity:    Worry: Not on file    Inability: Not on file  . Transportation needs:    Medical: Not on file    Non-medical: Not on file  Tobacco Use  . Smoking status: Never Smoker  . Smokeless tobacco: Never Used  Substance and Sexual Activity  . Alcohol use: No  . Drug use: No  . Sexual activity: Not on file  Lifestyle  . Physical activity:    Days per week: Not on file    Minutes per session: Not on file  . Stress: Not on file  Relationships  . Social connections:    Talks on phone: Not on file    Gets together: Not on file    Attends religious service: Not on file    Active member of club or organization: Not on file    Attends meetings of clubs or organizations: Not on file    Relationship status: Not on file  . Intimate partner violence:    Fear of current or ex partner: Not on file    Emotionally abused: Not on file    Physically abused: Not on file    Forced sexual activity: Not on file  Other Topics Concern  . Not on file  Social History Narrative  . Not on file  Family History  Problem Relation Age of Onset  . Heart disease Father   . Hyperlipidemia Father   . Hypertension Father     Allergies  Allergen Reactions  . Penicillins Rash    .Has patient had a PCN reaction causing immediate rash, facial/tongue/throat swelling, SOB or lightheadedness with hypotension: Unknown Has patient had a PCN reaction causing severe rash involving mucus membranes or skin necrosis: Unknown Has patient had a PCN reaction that required hospitalization: Unknown Has patient had a PCN reaction occurring within the last 10 years: Unknown If all of the above answers are "NO", then may proceed with Cephalosporin use.      Review of Systems   Review of Systems:  Negative Unless Checked Constitutional: [] Weight loss  [] Fever  [] Chills Cardiac: [] Chest pain   []  Atrial Fibrillation  [] Palpitations   [] Shortness of breath when laying flat   [] Shortness of breath with exertion. [] Shortness of breath at rest Vascular:  [] Pain in legs with walking   [] Pain in legs with standing [] Pain in legs when laying flat   [x] Claudication    [] Pain in feet when laying flat    [] History of DVT   [] Phlebitis   [x] Swelling in legs   [] Varicose veins   [] Non-healing ulcers Pulmonary:   [] Uses home oxygen   [] Productive cough   [] Hemoptysis   [] Wheeze  [] COPD   [] Asthma Neurologic:  [] Dizziness   [] Seizures  [] Blackouts [x] History of stroke   [] History of TIA  [] Aphasia   [] Temporary Blindness   [] Weakness or numbness in arm   [] Weakness or numbness in leg Musculoskeletal:   [] Joint swelling   [] Joint pain   [] Low back pain  []  History of Knee Replacement [] Arthritis [] back Surgeries  []  Spinal Stenosis    Hematologic:  [] Easy bruising  [] Easy bleeding   [] Hypercoagulable state   [] Anemic Gastrointestinal:  [] Diarrhea   [] Vomiting  [] Gastroesophageal reflux/heartburn   [] Difficulty swallowing. [] Abdominal pain Genitourinary:  [] Chronic kidney disease   [] Difficult urination  [] Anuric   [] Blood in urine [] Frequent urination  [] Burning with urination   [] Hematuria Skin:  [] Rashes   [] Ulcers [] Wounds Psychological:  [] History of anxiety   []  History of major depression  []  Memory Difficulties      OBJECTIVE:   Physical Exam  BP (!) 178/81 (BP Location: Right Arm)   Pulse 94   Resp 16   Ht 6\' 2"  (1.88 m)   Wt 184 lb (83.5 kg)   BMI 23.62 kg/m   Gen: WD/WN, NAD Head: [place]/AT, No temporalis wasting.  Ear/Nose/Throat: Hearing grossly intact, nares w/o erythema or drainage Eyes: PER, EOMI, sclera nonicteric.  Neck: Supple, no masses.  No JVD.  Pulmonary:  Good air movement, no use of accessory muscles.  Cardiac: RRR Vascular:  Vessel Right Left  Radial Palpable Palpable   Dorsalis Pedis Not Palpable Palpable  Posterior Tibial Not Palpable Palpable   Gastrointestinal: soft, non-distended. No guarding/no peritoneal signs.  Musculoskeletal: M/S 5/5 throughout.  No deformity or atrophy.  Neurologic: Pain and light touch intact in extremities.  Symmetrical.  Speech is fluent. Motor exam as listed above. Psychiatric: Judgment intact, Mood & affect appropriate for pt's clinical situation. Dermatologic: No Venous rashes. No Ulcers Noted.  No changes consistent with cellulitis. Lymph : No Cervical lymphadenopathy, no lichenification or skin changes of chronic lymphedema.       ASSESSMENT AND PLAN:  1. Atherosclerosis of native artery of left lower extremity with rest pain [facility])  Recommend:  The patient has evidence  of atherosclerosis of the lower extremities with claudication.  The patient does not voice lifestyle limiting changes at this point in time.  Noninvasive studies do not suggest clinically significant change.  No invasive studies, angiography or surgery at this time The patient should continue walking and begin a more formal exercise program.  The patient should continue antiplatelet therapy and aggressive treatment of the lipid abnormalities  No changes in the patient's medications at this time  The patient should continue wearing graduated compression socks 10-15 mmHg strength to control the mild edema.   - VAS [place] ABI WITH/WO TBI; Future  2. Type 2 diabetes mellitus with complication, without long-term current use of insulin (HCC) Continue hypoglycemic medications as already ordered, these medications have been reviewed and there are no changes at this time.  Hgb A1C to be monitored as already arranged by primary service   3. Hypertension goal BP (blood pressure) < 140/90 Continue antihypertensive medications as already ordered, these medications have been reviewed and there are no changes at this time.   4. Hyperglycemia Continue  hypoglycemic medications as already ordered, these medications have been reviewed and there are no changes at this time.  Hgb A1C to be monitored as already arranged by primary service    Current Outpatient Medications on File Prior to Visit  Medication Sig Dispense Refill  . amLODipine (NORVASC) 10 MG tablet Take 1 tablet (10 mg total) by mouth daily. 90 tablet 4  . aspirin EC 81 MG tablet Take 81 mg by mouth daily.    Marland Kitchen atorvastatin (LIPITOR) 40 MG tablet Take 1 tablet (40 mg total) by mouth daily. 90 tablet 3  . clopidogrel (PLAVIX) 75 MG tablet Take 1 tablet (75 mg total) by mouth daily. 30 tablet 0  . linagliptin (TRADJENTA) 5 MG TABS tablet Take 5 mg by mouth daily.    Marland Kitchen lisinopril-hydrochlorothiazide (PRINZIDE,ZESTORETIC) 20-25 MG tablet Take 1 tablet by mouth daily. 90 tablet 3  . metFORMIN (GLUCOPHAGE) 500 MG tablet Take 1 tablet (500 mg total) by mouth daily. 30 tablet 1   No current facility-administered medications on file prior to visit.     There are no Patient Instructions on file for this visit. Return in about 3 months (around 08/02/2019) for PAD.   Georgiana Spinner, NP  This note was completed with Office manager.  Any errors are purely unintentional.

## 2019-05-15 ENCOUNTER — Telehealth (INDEPENDENT_AMBULATORY_CARE_PROVIDER_SITE_OTHER): Payer: Self-pay | Admitting: Nurse Practitioner

## 2019-05-15 NOTE — Telephone Encounter (Signed)
Please call in Plavix 75mg , One tab daily, #90 with three refills.

## 2019-05-15 NOTE — Telephone Encounter (Signed)
Patient given Plavix 30day supply and is now out. No refills given with original script. Pt asking if he should continue medication and if so please send refill to pharmacy. AS, CMA

## 2019-05-15 NOTE — Telephone Encounter (Signed)
Med called in to [facility]. Left message for patient to advise this had been done. AS, CMA

## 2019-05-29 ENCOUNTER — Other Ambulatory Visit (INDEPENDENT_AMBULATORY_CARE_PROVIDER_SITE_OTHER): Payer: Self-pay | Admitting: Nurse Practitioner

## 2019-05-29 ENCOUNTER — Telehealth (INDEPENDENT_AMBULATORY_CARE_PROVIDER_SITE_OTHER): Payer: Self-pay

## 2019-05-29 NOTE — Telephone Encounter (Signed)
Unfortunately, we're not able to give out pain medication for strained muscles as we typically treat only vascular issues.  If he is concerned about his vascular status we would be happy to have him come in to double check ABIs to determine if there are any changes that would necessitate intervention.  I would also suggest that he contact his PCP for pain medication.  Alternatively, he can try staying of the leg, ice, elevation and 800 mg of ibuprofen for pain relief.

## 2019-05-29 NOTE — Telephone Encounter (Signed)
Patient has been made aware with Arna Medici NP medical recommendations and he stated if the pain did not get better he will call the office to schedule an appointment.

## 2019-05-30 ENCOUNTER — Encounter: Payer: Self-pay | Admitting: Emergency Medicine

## 2019-05-30 ENCOUNTER — Telehealth (INDEPENDENT_AMBULATORY_CARE_PROVIDER_SITE_OTHER): Payer: Self-pay | Admitting: Vascular Surgery

## 2019-05-30 ENCOUNTER — Other Ambulatory Visit: Payer: Self-pay

## 2019-05-30 ENCOUNTER — Emergency Department: Payer: Self-pay

## 2019-05-30 ENCOUNTER — Inpatient Hospital Stay
Admission: EM | Admit: 2019-05-30 | Discharge: 2019-05-31 | DRG: 272 | Disposition: A | Discharge status: Home / Self Care | Payer: Self-pay | Attending: Internal Medicine | Admitting: Internal Medicine

## 2019-05-30 ENCOUNTER — Other Ambulatory Visit (INDEPENDENT_AMBULATORY_CARE_PROVIDER_SITE_OTHER): Payer: Self-pay | Admitting: Vascular Surgery

## 2019-05-30 ENCOUNTER — Encounter
Admission: EM | Disposition: A | Discharge status: Home / Self Care | Payer: Self-pay | Source: Home / Self Care | Attending: Internal Medicine

## 2019-05-30 DIAGNOSIS — Z7902 Long term (current) use of antithrombotics/antiplatelets: Secondary | ICD-10-CM

## 2019-05-30 DIAGNOSIS — I70203 Unspecified atherosclerosis of native arteries of extremities, bilateral legs: Secondary | ICD-10-CM

## 2019-05-30 DIAGNOSIS — I70229 Atherosclerosis of native arteries of extremities with rest pain, unspecified extremity: Secondary | ICD-10-CM

## 2019-05-30 DIAGNOSIS — E860 Dehydration: Secondary | ICD-10-CM | POA: Diagnosis present

## 2019-05-30 DIAGNOSIS — I739 Peripheral vascular disease, unspecified: Secondary | ICD-10-CM

## 2019-05-30 DIAGNOSIS — I70202 Unspecified atherosclerosis of native arteries of extremities, left leg: Secondary | ICD-10-CM

## 2019-05-30 DIAGNOSIS — Z7984 Long term (current) use of oral hypoglycemic drugs: Secondary | ICD-10-CM

## 2019-05-30 DIAGNOSIS — Z1159 Encounter for screening for other viral diseases: Secondary | ICD-10-CM

## 2019-05-30 DIAGNOSIS — I6529 Occlusion and stenosis of unspecified carotid artery: Secondary | ICD-10-CM

## 2019-05-30 DIAGNOSIS — E1151 Type 2 diabetes mellitus with diabetic peripheral angiopathy without gangrene: Principal | ICD-10-CM | POA: Diagnosis present

## 2019-05-30 DIAGNOSIS — I998 Other disorder of circulatory system: Secondary | ICD-10-CM | POA: Diagnosis present

## 2019-05-30 DIAGNOSIS — M7989 Other specified soft tissue disorders: Secondary | ICD-10-CM

## 2019-05-30 DIAGNOSIS — E785 Hyperlipidemia, unspecified: Secondary | ICD-10-CM | POA: Diagnosis present

## 2019-05-30 DIAGNOSIS — Z79899 Other long term (current) drug therapy: Secondary | ICD-10-CM

## 2019-05-30 DIAGNOSIS — I10 Essential (primary) hypertension: Secondary | ICD-10-CM | POA: Diagnosis present

## 2019-05-30 DIAGNOSIS — Z9889 Other specified postprocedural states: Secondary | ICD-10-CM

## 2019-05-30 DIAGNOSIS — M79605 Pain in left leg: Secondary | ICD-10-CM

## 2019-05-30 DIAGNOSIS — Z7982 Long term (current) use of aspirin: Secondary | ICD-10-CM

## 2019-05-30 HISTORY — PX: LOWER EXTREMITY INTERVENTION: CATH118252

## 2019-05-30 LAB — COMPREHENSIVE METABOLIC PANEL
ALT: 19 U/L (ref 0–44)
AST: 20 U/L (ref 15–41)
Albumin: 4.8 g/dL (ref 3.5–5.0)
Alkaline Phosphatase: 124 U/L (ref 38–126)
Anion gap: 12 (ref 5–15)
BUN: 28 mg/dL — ABNORMAL HIGH (ref 6–20)
CO2: 25 mmol/L (ref 22–32)
Calcium: 9.5 mg/dL (ref 8.9–10.3)
Chloride: 99 mmol/L (ref 98–111)
Creatinine, Ser: 0.99 mg/dL (ref 0.61–1.24)
GFR calc Af Amer: >60 mL/min (ref >60)
GFR calc non Af Amer: >60 mL/min (ref >60)
Glucose, Bld: 119 mg/dL — ABNORMAL HIGH (ref 70–99)
Potassium: 4 mmol/L (ref 3.5–5.1)
Sodium: 136 mmol/L (ref 135–145)
Total Bilirubin: 0.7 mg/dL (ref 0.3–1.2)
Total Protein: 7.9 g/dL (ref 6.5–8.1)

## 2019-05-30 LAB — CBC WITH DIFFERENTIAL/PLATELET
Abs Immature Granulocytes: 0.01 10*3/uL (ref 0.00–0.07)
Basophils Absolute: 0 10*3/uL (ref 0.0–0.1)
Basophils Relative: 0 %
Eosinophils Absolute: 0.1 10*3/uL (ref 0.0–0.5)
Eosinophils Relative: 1 %
HCT: 41.6 % (ref 39.0–52.0)
Hemoglobin: 14.2 g/dL (ref 13.0–17.0)
Immature Granulocytes: 0 %
Lymphocytes Relative: 29 %
Lymphs Abs: 2.5 10*3/uL (ref 0.7–4.0)
MCH: 28.2 pg (ref 26.0–34.0)
MCHC: 34.1 g/dL (ref 30.0–36.0)
MCV: 82.7 fL (ref 80.0–100.0)
Monocytes Absolute: 0.6 10*3/uL (ref 0.1–1.0)
Monocytes Relative: 7 %
Neutro Abs: 5.3 10*3/uL (ref 1.7–7.7)
Neutrophils Relative %: 63 %
Platelets: 253 10*3/uL (ref 150–400)
RBC: 5.03 MIL/uL (ref 4.22–5.81)
RDW: 13.3 % (ref 11.5–15.5)
WBC: 8.5 10*3/uL (ref 4.0–10.5)
nRBC: 0 % (ref 0.0–0.2)

## 2019-05-30 LAB — SARS CORONAVIRUS 2 BY RT PCR (HOSPITAL ORDER, PERFORMED IN ~~LOC~~ HOSPITAL LAB): SARS Coronavirus 2: NEGATIVE

## 2019-05-30 LAB — GLUCOSE, CAPILLARY
Glucose-Capillary: 97 mg/dL (ref 70–99)
Glucose-Capillary: 155 mg/dL — ABNORMAL HIGH (ref 70–99)
Glucose-Capillary: 156 mg/dL — ABNORMAL HIGH (ref 70–99)
Glucose-Capillary: 143 mg/dL — ABNORMAL HIGH (ref 70–99)

## 2019-05-30 LAB — APTT: aPTT: 33 seconds (ref 24–36)

## 2019-05-30 LAB — PROTIME-INR
INR: 1 (ref 0.8–1.2)
Prothrombin Time: 13.1 seconds (ref 11.4–15.2)

## 2019-05-30 LAB — MRSA PCR SCREENING: MRSA by PCR: NEGATIVE

## 2019-05-30 SURGERY — LOWER EXTREMITY INTERVENTION
Anesthesia: Moderate Sedation

## 2019-05-30 MED ORDER — ONDANSETRON HCL 4 MG/2ML IJ SOLN: 4.0000 mg | Freq: Four times a day (QID) | INTRAMUSCULAR | Status: DC | PRN

## 2019-05-30 MED ORDER — SODIUM CHLORIDE 0.9 % IV SOLN
INTRAVENOUS | Status: DC
  Administered 2019-05-30: 1000 mL via INTRAVENOUS

## 2019-05-30 MED ORDER — ACETAMINOPHEN 325 MG PO TABS: 650.0000 mg | ORAL_TABLET | ORAL | Status: DC | PRN

## 2019-05-30 MED ORDER — TIROFIBAN (AGGRASTAT) BOLUS VIA INFUSION
25.0000 ug/kg | Freq: Once | INTRAVENOUS | Status: AC
  Administered 2019-05-30: 2098 ug via INTRAVENOUS
  Filled 2019-05-30: qty 42

## 2019-05-30 MED ORDER — LABETALOL HCL 5 MG/ML IV SOLN
INTRAVENOUS | Status: DC | PRN
  Administered 2019-05-30 (×2): 10 mg via INTRAVENOUS

## 2019-05-30 MED ORDER — HEPARIN SODIUM (PORCINE) 1000 UNIT/ML IJ SOLN
INTRAMUSCULAR | Status: AC
  Filled 2019-05-30: qty 1

## 2019-05-30 MED ORDER — SODIUM CHLORIDE 0.9 % IV SOLN
INTRAVENOUS | Status: DC
  Administered 2019-05-30: 20:00:00 via INTRAVENOUS

## 2019-05-30 MED ORDER — SODIUM CHLORIDE 0.9 % IV SOLN
INTRAVENOUS | Status: AC
  Administered 2019-05-31: 06:00:00 via INTRAVENOUS

## 2019-05-30 MED ORDER — CLOPIDOGREL BISULFATE 75 MG PO TABS
75.0000 mg | ORAL_TABLET | Freq: Every day | ORAL | Status: DC
  Administered 2019-05-31: 10:00:00 75 mg via ORAL
  Filled 2019-05-30: qty 1

## 2019-05-30 MED ORDER — SODIUM CHLORIDE 0.9 % IV SOLN: INTRAVENOUS | Status: DC

## 2019-05-30 MED ORDER — FENTANYL CITRATE (PF) 100 MCG/2ML IJ SOLN
INTRAMUSCULAR | Status: AC
  Filled 2019-05-30: qty 2

## 2019-05-30 MED ORDER — ACETAMINOPHEN 325 MG PO TABS: 650.0000 mg | ORAL_TABLET | Freq: Four times a day (QID) | ORAL | Status: DC | PRN

## 2019-05-30 MED ORDER — MIDAZOLAM HCL 2 MG/2ML IJ SOLN
INTRAMUSCULAR | Status: DC | PRN
  Administered 2019-05-30: 1 mg via INTRAVENOUS
  Administered 2019-05-30: 2 mg via INTRAVENOUS
  Administered 2019-05-30 (×2): 1 mg via INTRAVENOUS

## 2019-05-30 MED ORDER — INSULIN ASPART 100 UNIT/ML ~~LOC~~ SOLN: 0.0000 [IU] | Freq: Every day | SUBCUTANEOUS | Status: DC

## 2019-05-30 MED ORDER — TIROFIBAN HCL 3.75 MG/15ML IV CONC
250.0000 ug | Freq: Once | INTRAVENOUS | Status: DC
  Filled 2019-05-30: qty 15

## 2019-05-30 MED ORDER — HYDROCODONE-ACETAMINOPHEN 5-325 MG PO TABS
1.0000 | ORAL_TABLET | ORAL | Status: DC | PRN
  Administered 2019-05-31: 2 via ORAL
  Filled 2019-05-30: qty 2

## 2019-05-30 MED ORDER — HYDRALAZINE HCL 20 MG/ML IJ SOLN
5.0000 mg | INTRAMUSCULAR | Status: DC | PRN
  Administered 2019-05-31: 5 mg via INTRAVENOUS
  Filled 2019-05-30: qty 1

## 2019-05-30 MED ORDER — ATORVASTATIN CALCIUM 20 MG PO TABS
40.0000 mg | ORAL_TABLET | Freq: Every day | ORAL | Status: DC
  Administered 2019-05-31: 40 mg via ORAL
  Filled 2019-05-30 (×2): qty 2

## 2019-05-30 MED ORDER — ALTEPLASE 2 MG IJ SOLR
INTRAMUSCULAR | Status: DC | PRN
  Administered 2019-05-30: 10 mg

## 2019-05-30 MED ORDER — ACETAMINOPHEN 650 MG RE SUPP: 650.0000 mg | Freq: Four times a day (QID) | RECTAL | Status: DC | PRN

## 2019-05-30 MED ORDER — HEPARIN BOLUS VIA INFUSION
4000.0000 [IU] | Freq: Once | INTRAVENOUS | Status: AC
  Administered 2019-05-30: 4000 [IU] via INTRAVENOUS
  Filled 2019-05-30: qty 4000

## 2019-05-30 MED ORDER — FAMOTIDINE 20 MG PO TABS: 40.0000 mg | ORAL_TABLET | Freq: Once | ORAL | Status: DC | PRN

## 2019-05-30 MED ORDER — ASPIRIN EC 81 MG PO TBEC
81.0000 mg | DELAYED_RELEASE_TABLET | Freq: Every day | ORAL | Status: DC
  Administered 2019-05-31: 81 mg via ORAL
  Filled 2019-05-30: qty 1

## 2019-05-30 MED ORDER — TIROFIBAN HCL IN NACL 5-0.9 MG/100ML-% IV SOLN
0.1500 ug/kg/min | INTRAVENOUS | Status: AC
  Administered 2019-05-31: 0.15 ug/kg/min via INTRAVENOUS
  Filled 2019-05-30 (×3): qty 100

## 2019-05-30 MED ORDER — CLINDAMYCIN PHOSPHATE 300 MG/50ML IV SOLN: 300.0000 mg | Freq: Once | INTRAVENOUS | Status: DC

## 2019-05-30 MED ORDER — SODIUM CHLORIDE 0.9% FLUSH: 3.0000 mL | INTRAVENOUS | Status: DC | PRN

## 2019-05-30 MED ORDER — HYDROMORPHONE HCL 1 MG/ML IJ SOLN
1.0000 mg | Freq: Once | INTRAMUSCULAR | Status: AC | PRN
  Administered 2019-05-30: 18:00:00 1 mg via INTRAVENOUS

## 2019-05-30 MED ORDER — SODIUM CHLORIDE 0.9 % IV SOLN: 250.0000 mL | INTRAVENOUS | Status: DC | PRN

## 2019-05-30 MED ORDER — INSULIN ASPART 100 UNIT/ML ~~LOC~~ SOLN
0.0000 [IU] | Freq: Three times a day (TID) | SUBCUTANEOUS | Status: DC
  Administered 2019-05-31 (×2): 2 [IU] via SUBCUTANEOUS
  Filled 2019-05-30 (×2): qty 1

## 2019-05-30 MED ORDER — TIROFIBAN HCL IV 12.5 MG/250 ML
INTRAVENOUS | Status: AC
  Filled 2019-05-30: qty 250

## 2019-05-30 MED ORDER — SODIUM CHLORIDE 0.9% FLUSH
3.0000 mL | Freq: Two times a day (BID) | INTRAVENOUS | Status: DC
  Administered 2019-05-30: 22:00:00 3 mL via INTRAVENOUS

## 2019-05-30 MED ORDER — ALBUTEROL SULFATE (2.5 MG/3ML) 0.083% IN NEBU: 2.5000 mg | INHALATION_SOLUTION | RESPIRATORY_TRACT | Status: DC | PRN

## 2019-05-30 MED ORDER — DIPHENHYDRAMINE HCL 50 MG/ML IJ SOLN: 50.0000 mg | Freq: Once | INTRAMUSCULAR | Status: DC | PRN

## 2019-05-30 MED ORDER — CLOPIDOGREL BISULFATE 75 MG PO TABS: 75.0000 mg | ORAL_TABLET | Freq: Every day | ORAL | Status: DC

## 2019-05-30 MED ORDER — FENTANYL CITRATE (PF) 100 MCG/2ML IJ SOLN
INTRAMUSCULAR | Status: DC | PRN
  Administered 2019-05-30: 25 ug via INTRAVENOUS
  Administered 2019-05-30 (×2): 50 ug via INTRAVENOUS
  Administered 2019-05-30 (×3): 25 ug via INTRAVENOUS

## 2019-05-30 MED ORDER — HYDROMORPHONE HCL 1 MG/ML IJ SOLN: 0.5000 mg | INTRAMUSCULAR | Status: DC | PRN

## 2019-05-30 MED ORDER — SENNOSIDES-DOCUSATE SODIUM 8.6-50 MG PO TABS: 1.0000 | ORAL_TABLET | Freq: Every evening | ORAL | Status: DC | PRN

## 2019-05-30 MED ORDER — MORPHINE SULFATE (PF) 4 MG/ML IV SOLN: 4.0000 mg | INTRAVENOUS | Status: DC | PRN

## 2019-05-30 MED ORDER — OXYCODONE HCL 5 MG PO TABS: 5.0000 mg | ORAL_TABLET | ORAL | Status: DC | PRN

## 2019-05-30 MED ORDER — ONDANSETRON HCL 4 MG PO TABS: 4.0000 mg | ORAL_TABLET | Freq: Four times a day (QID) | ORAL | Status: DC | PRN

## 2019-05-30 MED ORDER — CLINDAMYCIN PHOSPHATE 300 MG/50ML IV SOLN
300.0000 mg | Freq: Once | INTRAVENOUS | Status: DC
  Administered 2019-05-30: 300 mg via INTRAVENOUS

## 2019-05-30 MED ORDER — AMLODIPINE BESYLATE 10 MG PO TABS
10.0000 mg | ORAL_TABLET | Freq: Every day | ORAL | Status: DC
  Administered 2019-05-31: 10 mg via ORAL
  Filled 2019-05-30: qty 1

## 2019-05-30 MED ORDER — HEPARIN SODIUM (PORCINE) 1000 UNIT/ML IJ SOLN
INTRAMUSCULAR | Status: DC | PRN
  Administered 2019-05-30: 3000 [IU] via INTRAVENOUS

## 2019-05-30 MED ORDER — LABETALOL HCL 5 MG/ML IV SOLN: 10.0000 mg | INTRAVENOUS | Status: DC | PRN

## 2019-05-30 MED ORDER — MIDAZOLAM HCL 2 MG/ML PO SYRP: 8.0000 mg | ORAL_SOLUTION | Freq: Once | ORAL | Status: DC | PRN

## 2019-05-30 MED ORDER — HEPARIN (PORCINE) 25000 UT/250ML-% IV SOLN
1150.0000 [IU]/h | INTRAVENOUS | Status: DC
  Administered 2019-05-30: 1150 [IU]/h via INTRAVENOUS
  Filled 2019-05-30: qty 250

## 2019-05-30 MED ORDER — SODIUM CHLORIDE (PF) 0.9 % IJ SOLN
INTRAMUSCULAR | Status: AC
  Administered 2019-05-30: 22:00:00 3 mL via INTRAVENOUS
  Filled 2019-05-30: qty 50

## 2019-05-30 MED ORDER — LABETALOL HCL 5 MG/ML IV SOLN
INTRAVENOUS | Status: AC
  Filled 2019-05-30: qty 4

## 2019-05-30 MED ORDER — MIDAZOLAM HCL 5 MG/5ML IJ SOLN
INTRAMUSCULAR | Status: AC
  Filled 2019-05-30: qty 5

## 2019-05-30 MED ORDER — NITROGLYCERIN 1 MG/10 ML FOR IR/CATH LAB
INTRA_ARTERIAL | Status: DC | PRN
  Administered 2019-05-30: 400 ug
  Administered 2019-05-30: 600 ug

## 2019-05-30 MED ORDER — METHYLPREDNISOLONE SODIUM SUCC 125 MG IJ SOLR: 125.0000 mg | Freq: Once | INTRAMUSCULAR | Status: DC | PRN

## 2019-05-30 MED ORDER — BISACODYL 5 MG PO TBEC: 5.0000 mg | DELAYED_RELEASE_TABLET | Freq: Every day | ORAL | Status: DC | PRN

## 2019-05-30 SURGICAL SUPPLY — 24 items
BALLN LUTONIX 018 4X100X130 (BALLOONS) ×3
BALLN LUTONIX DCB 6X40X130 (BALLOONS) ×3
BALLN ULTRVRSE 3X220X150 (BALLOONS) ×3
BALLOON LUTONIX 018 4X100X130 (BALLOONS) ×1 IMPLANT
BALLOON LUTONIX DCB 6X40X130 (BALLOONS) ×1 IMPLANT
BALLOON ULTRVRSE 3X220X150 (BALLOONS) ×1 IMPLANT
CANISTER PENUMBRA ENGINE (MISCELLANEOUS) ×3 IMPLANT
CATH BEACON 5 .035 65 RIM TIP (CATHETERS) ×3 IMPLANT
CATH INDIGO CAT6 KIT (CATHETERS) ×3 IMPLANT
CATH INDIGO SEP 6 (CATHETERS) ×3 IMPLANT
CATH INFUS 135CMX50CM (CATHETERS) ×3 IMPLANT
CATH VERT 5FR 125CM (CATHETERS) ×3 IMPLANT
DEVICE PRESTO INFLATION (MISCELLANEOUS) ×6 IMPLANT
DEVICE STARCLOSE SE CLOSURE (Vascular Products) ×3 IMPLANT
GLIDEWIRE ADV .035X260CM (WIRE) ×3 IMPLANT
NEEDLE ENTRY 21GA 7CM ECHOTIP (NEEDLE) ×3 IMPLANT
PACK ANGIOGRAPHY (CUSTOM PROCEDURE TRAY) ×3 IMPLANT
SET INTRO CAPELLA COAXIAL (SET/KITS/TRAYS/PACK) ×3 IMPLANT
SHEATH BRITE TIP 5FRX11 (SHEATH) ×3 IMPLANT
SHEATH PINNACLE ST 6F 45CM (SHEATH) ×3 IMPLANT
TUBING CONTRAST HIGH PRESS 72 (TUBING) ×3 IMPLANT
WIRE G V18X300CM (WIRE) ×3 IMPLANT
WIRE J 3MM .035X145CM (WIRE) ×3 IMPLANT
WIRE MAGIC TORQUE 260C (WIRE) ×3 IMPLANT

## 2019-05-30 NOTE — Progress Notes (Signed)
Advanced Care Plan.  Purpose of Encounter: CODE STATUS. Parties in Attendance: The patient and me. Patient's Decisional Capacity: Yes. Medical Story:  Jeremy Padilla  is a 53 y.o. male with a known history of hypertension, diabetes, hyperlipidemia, PVD.  Patient is being admitted for aherosclerotic disease of the left lower extremity with possible ischemia.  Discussed with patient about his current condition, prognosis and CODE STATUS.  The patient does want to be resuscitated and intubated if he has cardiopulmonary arrest. Plan:  Code Status: Full code. Time spent discussing advance care planning: 18 minutes.

## 2019-05-30 NOTE — ED Triage Notes (Signed)
Pain is not in calf,, it is front of lower leg.

## 2019-05-30 NOTE — ED Notes (Signed)
PA Joelene Millin with vascular services at bedside with vascular doppler.

## 2019-05-30 NOTE — Consult Note (Signed)
[facility] Vascular Consult Note  MRN : [id number]  Jeremy Padilla is a 53 y.o. (03/12/1966) male who presents with chief complaint of  Chief Complaint  Patient presents with  . Leg Pain   History of Present Illness: The patient is a 53 year old male with a past medical history of hypertension, hyperlipidemia, diabetes mellitus, carotid artery stenosis, atherosclerotic disease of the lower extremity, status post endovascular intervention to the left lower extremity who presents to the [facility] with 3 days of progressively worsening left lower extremity pain.  The patient notes he was throwing out his garbage approximately 3 days ago when he felt an acute pain primarily located to the right calf.  Over the last 3 days, the patient notes that his pain has progressively worsened especially with ambulation.  He notes that the pain is worse at night.  He states he starting to feel loss of motor function to the left foot.  He also is concerned about how cold his foot has become.  He denies any recent surgery or trauma to the left foot.  Patient denies any fever, nausea vomiting.  Patient denies any shortness of breath or chest pain.  Vascular surgery was consulted by the emergency department NP Triplett for further recommendations. Current Facility-Administered Medications  Medication Dose Route Frequency Provider Last Rate Last Dose  . 0.9 %  sodium chloride infusion   Intravenous Continuous Casia Corti A, PA-C      . heparin ADULT infusion 100 units/mL (25000 units/23550mL sodium chloride 0.45%)  1,150 Units/hr Intravenous Continuous Katha CabalDuncan, Asajah R, RPH 11.5 mL/hr at 05/30/19 1536 1,150 Units/hr at 05/30/19 1536   Current Outpatient Medications  Medication Sig Dispense Refill  . amLODipine (NORVASC) 10 MG tablet Take 1 tablet (10 mg total) by mouth daily. 90 tablet 4  . aspirin EC 81 MG tablet Take 81 mg by mouth daily.    Marland Kitchen.  atorvastatin (LIPITOR) 40 MG tablet Take 1 tablet (40 mg total) by mouth daily. 90 tablet 3  . glipiZIDE (GLUCOTROL) 10 MG tablet Take 10 mg by mouth daily before breakfast.    . hydrochlorothiazide (HYDRODIURIL) 25 MG tablet Take 25 mg by mouth daily.    Marland Kitchen. linagliptin (TRADJENTA) 5 MG TABS tablet Take 5 mg by mouth daily.    Marland Kitchen. lisinopril (ZESTRIL) 40 MG tablet Take 40 mg by mouth daily.     Past Medical History:  Diagnosis Date  . Abscess 10/05/15  . Diabetes mellitus without complication (HCC)   . 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 Hyperlipidemia   . Hypertension    Past Surgical History:  Procedure Laterality Date  . LOWER EXTREMITY ANGIOGRAPHY Left 04/09/2019   Procedure: LOWER EXTREMITY ANGIOGRAPHY;  Surgeon: Annice Needyew, Jason S, MD;  Location: [facility] INVASIVE CV LAB;  Service: Cardiovascular;  Laterality: Left;  . WRIST SURGERY Left    Social History Social History   Tobacco Use  . Smoking status: Never Smoker  . Smokeless tobacco: Never Used  Substance Use Topics  . Alcohol use: No  . Drug use: No   Family History Family History  Problem Relation Age of Onset  . Heart disease Father   . Hyperlipidemia Father   . Hypertension Father   Denies family history of peripheral artery disease, venous disease or renal disease.  Allergies  Allergen Reactions  . Penicillins Rash    .Has patient had a PCN reaction causing immediate rash, facial/tongue/throat swelling, SOB or lightheadedness with hypotension: Unknown Has patient had  a PCN reaction causing severe rash involving mucus membranes or skin necrosis: Unknown Has patient had a PCN reaction that required hospitalization: Unknown Has patient had a PCN reaction occurring within the last 10 years: Unknown If all of the above answers are "NO", then may proceed with Cephalosporin use.    REVIEW OF SYSTEMS (Negative unless checked)  Constitutional: [] Weight loss  [] Fever  [] Chills Cardiac: [] Chest pain   [] Chest pressure   [] Palpitations    [] Shortness of breath when laying flat   [] Shortness of breath at rest   [] Shortness of breath with exertion. Vascular:  [x] Pain in legs with walking   [x] Pain in legs at rest   [x] Pain in legs when laying flat   [x] Claudication   [x] Pain in feet when walking  [x] Pain in feet at rest  [x] Pain in feet when laying flat   [] History of DVT   [] Phlebitis   [] Swelling in legs   [] Varicose veins   [] Non-healing ulcers Pulmonary:   [] Uses home oxygen   [] Productive cough   [] Hemoptysis   [] Wheeze  [] COPD   [] Asthma Neurologic:  [] Dizziness  [] Blackouts   [] Seizures   [] History of stroke   [] History of TIA  [] Aphasia   [] Temporary blindness   [] Dysphagia   [] Weakness or numbness in arms   [] Weakness or numbness in legs Musculoskeletal:  [] Arthritis   [] Joint swelling   [] Joint pain   [] Low back pain Hematologic:  [] Easy bruising  [] Easy bleeding   [] Hypercoagulable state   [] Anemic  [] Hepatitis Gastrointestinal:  [] Blood in stool   [] Vomiting blood  [] Gastroesophageal reflux/heartburn   [] Difficulty swallowing. Genitourinary:  [] Chronic kidney disease   [] Difficult urination  [] Frequent urination  [] Burning with urination   [] Blood in urine Skin:  [] Rashes   [] Ulcers   [] Wounds Psychological:  [] History of anxiety   []  History of major depression.  Physical Examination  Vitals:   05/30/19 1346 05/30/19 1419 05/30/19 1430 05/30/19 1500  BP: (!) 158/87 137/76 (!) 155/84 (!) 161/87  Pulse: 98 83 84 83  Resp: 18 16  18   Temp:  98.2 F (36.8 C)    TempSrc:  Oral    SpO2: 99% 99% 99% 100%  Weight:  83.9 kg     Body mass index is 23.75 kg/m. Gen:  WD/WN, NAD Head: [place]/AT, No temporalis wasting. Prominent temp pulse not noted. Ear/Nose/Throat: Hearing grossly intact, nares w/o erythema or drainage, oropharynx w/o Erythema/Exudate Eyes: Sclera non-icteric, conjunctiva clear Neck: Trachea midline.  No JVD.  Pulmonary:  Good air movement, respirations not labored, equal bilaterally.  Cardiac: RRR, normal  S1, S2. Vascular:  Vessel Right Left  Radial Palpable Palpable  Ulnar Palpable Palpable  Brachial Palpable Palpable  Carotid Palpable, without bruit Palpable, without bruit  Aorta Not palpable N/A  Femoral Palpable Palpable  Popliteal Palpable Palpable  PT Non-Palpable Palpable  DP Palpable Palpable   Left Lower Extremity: Thigh soft.  Calf soft.  Extremity is cool from the knee distally.  Unable to palpate pedal pulses.  Sluggish capillary refill.  Motor/sensory is intact.  Pale skin.  Gastrointestinal: soft, non-tender/non-distended. No guarding/reflex.  Musculoskeletal: M/S 5/5 throughout.  Extremities without ischemic changes.  No deformity or atrophy. No edema. Neurologic: Sensation grossly intact in extremities.  Symmetrical.  Speech is fluent. Motor exam as listed above. Psychiatric: Judgment intact, Mood & affect appropriate for pt's clinical situation. Dermatologic: No rashes or ulcers noted.  No cellulitis or open wounds. Lymph : No Cervical, Axillary, or Inguinal lymphadenopathy.  CBC Lab  Results  Component Value Date   WBC 8.5 05/30/2019   HGB 14.2 05/30/2019   HCT 41.6 05/30/2019   MCV 82.7 05/30/2019   PLT 253 05/30/2019   BMET    Component Value Date/Time   NA 136 05/30/2019 1343   K 4.0 05/30/2019 1343   CL 99 05/30/2019 1343   CO2 25 05/30/2019 1343   GLUCOSE 119 (H) 05/30/2019 1343   BUN 28 (H) 05/30/2019 1343   CREATININE 0.99 05/30/2019 1343   CALCIUM 9.5 05/30/2019 1343   GFRNONAA >60 05/30/2019 1343   GFRAA >60 05/30/2019 1343   Estimated Creatinine Clearance: 99.2 mL/min (by C-G formula based on SCr of 0.99 mg/dL).  COAG Lab Results  Component Value Date   INR 1.0 05/30/2019   INR 1.0 04/07/2019   Radiology [place] Venous Img Lower Unilateral Left  Result Date: 05/30/2019 CLINICAL DATA:  Pain, swelling x2 days.  Recent vascular procedure. EXAM: LEFT LOWER EXTREMITY VENOUS DOPPLER ULTRASOUND TECHNIQUE: Gray-scale sonography with compression, as  well as color and duplex ultrasound, were performed to evaluate the deep venous system from the level of the common femoral vein through the popliteal and proximal calf veins. COMPARISON:  04/07/2019 FINDINGS: Normal compressibility of the common femoral, superficial femoral, and popliteal veins, as well as the proximal calf veins. No filling defects to suggest DVT on grayscale or color Doppler imaging. Doppler waveforms show normal direction of venous flow, normal respiratory phasicity and response to augmentation. Survey views of the contralateral common femoral vein are unremarkable. IMPRESSION: No femoropopliteal and no calf DVT in the visualized calf veins. If clinical symptoms are inconsistent or if there are persistent or worsening symptoms, further imaging (possibly involving the iliac veins) may be warranted. Electronically Signed   By: Corlis Leak  Hassell M.D.   On: 05/30/2019 11:50   Abi With/wo Tbi  Result Date: 05/02/2019 LOWER EXTREMITY DOPPLER STUDY Indications: Peripheral artery disease.  Comparison Study: 04/08/2019 Performing Technologist: Reece AgarValarie Baldwin RT (R)(VS)  Examination Guidelines: A complete evaluation includes at minimum, Doppler waveform signals and systolic blood pressure reading at the level of bilateral brachial, anterior tibial, and posterior tibial arteries, when vessel segments are accessible. Bilateral testing is considered an integral part of a complete examination. Photoelectric Plethysmograph (PPG) waveforms and toe systolic pressure readings are included as required and additional duplex testing as needed. Limited examinations for reoccurring indications may be performed as noted.  ABI Findings: +---------+------------------+-----+----------+--------+ Right    Rt Pressure (mmHg)IndexWaveform  Comment  +---------+------------------+-----+----------+--------+ Brachial 168                                       +---------+------------------+-----+----------+--------+ ATA       90                0.52 monophasic         +---------+------------------+-----+----------+--------+ PTA      101               0.58 monophasic         +---------+------------------+-----+----------+--------+ Great Toe68                0.39 Abnormal           +---------+------------------+-----+----------+--------+ +---------+------------------+-----+---------+-------+ Left     Lt Pressure (mmHg)IndexWaveform Comment +---------+------------------+-----+---------+-------+ Brachial 174                                     +---------+------------------+-----+---------+-------+  ATA      175               1.01 triphasic        +---------+------------------+-----+---------+-------+ PTA      162               0.93 biphasic         +---------+------------------+-----+---------+-------+ Great Toe153               0.88 Normal           +---------+------------------+-----+---------+-------+ +-------+-----------+-----------+------------+------------+ ABI/TBIToday's ABIToday's TBIPrevious ABIPrevious TBI +-------+-----------+-----------+------------+------------+ Right  .58        .39        .54         .44          +-------+-----------+-----------+------------+------------+ Left   1.01       .88        .22         absent       +-------+-----------+-----------+------------+------------+ Right ABIs appear essentially unchanged compared to prior study on 04/08/2019. Left ABIs appear increased compared to prior study on 04/07/2019.  Summary: Right: Resting right ankle-brachial index indicates moderate right lower extremity arterial disease. The right toe-brachial index is abnormal. Right TBI appears slightly decreased as compared to the previous exam on 04/08/2019. Left: Resting left ankle-brachial index is within normal range. No evidence of significant left lower extremity arterial disease. The left toe-brachial index is normal. Left TBI appears increased from the  previous exam on 04/08/2019.  *See table(s) above for measurements and observations.  Electronically signed by Festus BarrenJason Dew MD on 05/02/2019 at 12:20:34 PM.   Final    Assessment/Plan The patient is a 53 year old male with a past medical history of hypertension, hyperlipidemia, diabetes mellitus, carotid artery stenosis, atherosclerotic disease of the lower extremity, status post endovascular intervention to the left lower extremity who presents to the [facility] with 3 days of progressively worsening left lower extremity pain. 1.  Atherosclerotic disease of the left lower extremity with possible ischemia: Patient with multiple risk factors for peripheral artery disease.  Patient with a recent left lower extremity endovascular intervention a few weeks ago.  The patient presents with worsening left lower extremity discomfort.  On physical exam the patient skin is pale, unable to palpate pedal pulses, skin temperature is cool with a sluggish capillary refill.  Recommend an emergent left lower extremity angiogram with possible intervention to assess the patient's anatomy and degree of contributing peripheral artery disease.  If appropriate an attempt to revascularize the leg can be made at that time.  Procedure, risks and benefits were explained to the patient.  All questions were answered.  The patient wishes to proceed. 2. Hyperlipidemia: On aspirin and statin for medical management. Encouraged good control as its slows the progression of atherosclerotic disease. 3. Diabetes: Appropriate medications. Encouraged good control as its slows the progression of atherosclerotic disease. 4. Hypertension: Appropriate medications. Encouraged good control as its slows the progression of atherosclerotic disease.  Discussed with Dr. Romie JumperSchnier  Jeremy Campoverde A Meckenzie Balsley, PA-C  05/30/2019 3:56 PM  This note was created with Dragon medical transcription system.  Any error is purely unintentional.

## 2019-05-30 NOTE — H&P (Addendum)
[facility] [place] [facility]   PATIENT NAME: Jeremy Padilla    MR#:  [id number]  DATE OF BIRTH:  09/22/1966  DATE OF ADMISSION:  05/30/2019  PRIMARY CARE PHYSICIAN: Toy CookeyHeadrick, Emily, FNP   REQUESTING/REFERRING PHYSICIAN: Chinita Pesterriplett, Cari B, FNP  CHIEF COMPLAINT:   Chief Complaint  Patient presents with  . Leg Pain   Left leg pain for 2 days. HISTORY OF PRESENT ILLNESS:  Jeremy Aguasimothy Offer  is a 53 y.o. male with a known history of hypertension, diabetes, hyperlipidemia, PVD.  The patient presents the ED with above chief complaints.  The patient had angiogram and stent placement of left leg 6 weeks ago.  He was fine until 2 days ago.  He developed sharp sudden shooting pain in the left leg.  The leg pain has persisted for 2 days.  He also complains that his left leg is very cold and the decreased sensation.  The patient called Dr. Driscilla Grammesew's office and sent to ED for further evaluation.  Venous duplex is unremarkable.  He is suspected acute ischemia of the right lower extremity.  Dr. Wyn Quakerew suggested heparin drip and angiogram. PAST MEDICAL HISTORY:   Past Medical History:  Diagnosis Date  . Abscess 10/05/15  . Diabetes mellitus without complication (HCC)   . 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 Hyperlipidemia   . Hypertension     PAST SURGICAL HISTORY:   Past Surgical History:  Procedure Laterality Date  . LOWER EXTREMITY ANGIOGRAPHY Left 04/09/2019   Procedure: LOWER EXTREMITY ANGIOGRAPHY;  Surgeon: Annice Needyew, Jason S, MD;  Location: [facility] INVASIVE CV LAB;  Service: Cardiovascular;  Laterality: Left;  . WRIST SURGERY Left     SOCIAL HISTORY:   Social History   Tobacco Use  . Smoking status: Never Smoker  . Smokeless tobacco: Never Used  Substance Use Topics  . Alcohol use: No    FAMILY HISTORY:   Family History  Problem Relation Age of Onset  . Heart disease Father   . Hyperlipidemia Father   . Hypertension Father     DRUG ALLERGIES:   Allergies  Allergen Reactions   . Penicillins Rash    .Has patient had a PCN reaction causing immediate rash, facial/tongue/throat swelling, SOB or lightheadedness with hypotension: Unknown Has patient had a PCN reaction causing severe rash involving mucus membranes or skin necrosis: Unknown Has patient had a PCN reaction that required hospitalization: Unknown Has patient had a PCN reaction occurring within the last 10 years: Unknown If all of the above answers are "NO", then may proceed with Cephalosporin use.     REVIEW OF SYSTEMS:   Review of Systems  Constitutional: Negative for chills, fever and malaise/fatigue.  HENT: Negative for sore throat.   Eyes: Negative for blurred vision and double vision.  Respiratory: Negative for cough, hemoptysis, shortness of breath, wheezing and stridor.   Cardiovascular: Negative for chest pain, palpitations, orthopnea and leg swelling.  Gastrointestinal: Negative for abdominal pain, blood in stool, diarrhea, melena, nausea and vomiting.  Genitourinary: Negative for dysuria, flank pain and hematuria.  Musculoskeletal: Negative for back pain and joint pain.       Left leg pain and cold.  Skin: Negative for rash.  Neurological: Negative for dizziness, sensory change, focal weakness, seizures, loss of consciousness, weakness and headaches.  Endo/Heme/Allergies: Negative for polydipsia.  Psychiatric/Behavioral: Negative for depression. The patient is not nervous/anxious.     MEDICATIONS AT HOME:   Prior to Admission medications   Medication Sig Start Date End Date  Taking? Authorizing Provider  amLODipine (NORVASC) 10 MG tablet Take 1 tablet (10 mg total) by mouth daily. 08/03/17   Minna Merritts, MD  aspirin EC 81 MG tablet Take 81 mg by mouth daily.    [provider]  atorvastatin (LIPITOR) 40 MG tablet Take 1 tablet (40 mg total) by mouth daily. 08/03/17   Minna Merritts, MD  clopidogrel (PLAVIX) 75 MG tablet Take 1 tablet (75 mg total) by mouth daily. 04/07/19  04/06/20  Laban Emperor, PA-C  linagliptin (TRADJENTA) 5 MG TABS tablet Take 5 mg by mouth daily. 04/07/19   [provider]  lisinopril-hydrochlorothiazide (PRINZIDE,ZESTORETIC) 20-25 MG tablet Take 1 tablet by mouth daily. 08/03/17   Minna Merritts, MD  metFORMIN (GLUCOPHAGE) 500 MG tablet Take 1 tablet (500 mg total) by mouth daily. 10/08/15   Bobetta Lime, MD      VITAL SIGNS:  Blood pressure 137/76, pulse 83, temperature 98.2 F (36.8 C), temperature source Oral, resp. rate 16, weight 83.9 kg, SpO2 99 %.  PHYSICAL EXAMINATION:  Physical Exam  GENERAL:  53 y.o.-year-old patient lying in the bed with no acute distress.  EYES: Pupils equal, round, reactive to light and accommodation. No scleral icterus. Extraocular muscles intact.  HEENT: Head atraumatic, normocephalic. Oropharynx and nasopharynx clear.  NECK:  Supple, no jugular venous distention. No thyroid enlargement, no tenderness.  LUNGS: Normal breath sounds bilaterally, no wheezing, rales,rhonchi or crepitation. No use of accessory muscles of respiration.  CARDIOVASCULAR: S1, S2 normal. No murmurs, rubs, or gallops.  ABDOMEN: Soft, nontender, nondistended. Bowel sounds present. No organomegaly or mass.  EXTREMITIES: No pedal edema, cyanosis, or clubbing.  Decreased sensation and cold from the left knee to left foot.  No erythema.  Unable to palpate pedal pulse.  Code left foot. NEUROLOGIC: Cranial nerves II through XII are intact. Muscle strength 5/5 in all extremities. Sensation intact. Gait not checked.  PSYCHIATRIC: The patient is alert and oriented x 3.  SKIN: No obvious rash, lesion, or ulcer.   LABORATORY PANEL:   CBC Recent Labs  Lab 05/30/19 1343  WBC 8.5  HGB 14.2  HCT 41.6  PLT 253   ------------------------------------------------------------------------------------------------------------------  Chemistries  Recent Labs  Lab 05/30/19 1343  NA 136  K 4.0  CL 99  CO2 25  GLUCOSE 119*   BUN 28*  CREATININE 0.99  CALCIUM 9.5  AST 20  ALT 19  ALKPHOS 124  BILITOT 0.7   ------------------------------------------------------------------------------------------------------------------  Cardiac Enzymes No results for input(s): TROPONINI in the last 168 hours. ------------------------------------------------------------------------------------------------------------------  RADIOLOGY:  US Venous Img Lower Unilateral Left  Result Date: 05/30/2019 CLINICAL DATA:  Pain, swelling x2 days.  Recent vascular procedure. EXAM: LEFT LOWER EXTREMITY VENOUS DOPPLER ULTRASOUND TECHNIQUE: Gray-scale sonography with compression, as well as color and duplex ultrasound, were performed to evaluate the deep venous system from the level of the common femoral vein through the popliteal and proximal calf veins. COMPARISON:  04/07/2019 FINDINGS: Normal compressibility of the common femoral, superficial femoral, and popliteal veins, as well as the proximal calf veins. No filling defects to suggest DVT on grayscale or color Doppler imaging. Doppler waveforms show normal direction of venous flow, normal respiratory phasicity and response to augmentation. Survey views of the contralateral common femoral vein are unremarkable. IMPRESSION: No femoropopliteal and no calf DVT in the visualized calf veins. If clinical symptoms are inconsistent or if there are persistent or worsening symptoms, further imaging (possibly involving the iliac veins) may be warranted. Electronically Signed   By:  Corlis Leak  Hassell M.D.   On: 05/30/2019 11:50      IMPRESSION AND PLAN:   Atherosclerotic disease of the left lower extremity with possible ischemia The patient will be admitted to medical floor. Start heparin drip, continue aspirin and Plavix and statin. Follow-up with Dr. dew for angiogram and further procedure. Pain control.  Dehydration.  Hold lisinopril-HCTZ, IV fluid support and follow-up BMP.  Hypertension.  Continue  Norvasc but hold lisinopril-HCTZ due to dehydration.  Diabetes.  Hold home medication, start sliding scale.  All the records are reviewed and case discussed with ED provider. Management plans discussed with the patient, family and they are in agreement.  CODE STATUS: Full code.  TOTAL TIME TAKING CARE OF THIS PATIENT: 42 minutes.    Shaune PollackQing Marshal Eskew M.D on 05/30/2019 at 3:25 PM  Between 7am to 6pm - Pager - [phone]  After 6pm go to [email or web address] - Social [email or web address], [email or web address] [email or web address]  [facility]  Office  [phone]  CC: Primary care physician; Toy CookeyHeadrick, Emily, FNP   Note: This dictation was prepared with Dragon dictation along with smaller phrase technology. Any transcriptional errors that result from this process are unin

## 2019-05-30 NOTE — Consult Note (Signed)
Iron for Heparin dosing Indication: Arterial Occlusion LLE  Allergies  Allergen Reactions  . Penicillins Rash    .Has patient had a PCN reaction causing immediate rash, facial/tongue/throat swelling, SOB or lightheadedness with hypotension: Unknown Has patient had a PCN reaction causing severe rash involving mucus membranes or skin necrosis: Unknown Has patient had a PCN reaction that required hospitalization: Unknown Has patient had a PCN reaction occurring within the last 10 years: Unknown If all of the above answers are "NO", then may proceed with Cephalosporin use.     Patient Measurements:   Heparin Dosing Weight Used: 83.9 kg   Vital Signs: Temp: 98.4 F (36.9 C) (06/19 0942) Temp Source: Oral (06/19 0942) BP: 158/87 (06/19 1346) Pulse Rate: 98 (06/19 1346)  Labs: No results for input(s): HGB, HCT, PLT, APTT, LABPROT, INR, HEPARINUNFRC, HEPRLOWMOCWT, CREATININE, CKTOTAL, CKMB, TROPONINI in the last 72 hours.  CrCl cannot be calculated (Patient's most recent lab result is older than the maximum 21 days allowed.).   Medications:  Aspirin 81 mg QD  Clopidogrel 75 mg QD  No documented anticoagulants noted per chart and by patient   Assessment: Jeremy Padilla is a 72 YOM who presented with complaints of LLE pain.  Vascular doppler results pending  6/19 Venous Img Lower Unilateral Left - No femoropopliteal and no calf DVT in the visualized calf veins  Goal of Therapy:  Heparin level 0.3-0.7 units/ml Monitor platelets by anticoagulation protocol: Yes   Plan:  Baseline labs have been ordered  Heparin DW: 83.9 kg Give 4000 units bolus x 1 Start heparin infusion at 1150 units/hr Check anti-Xa level in 6 hours and daily while on heparin, per protocol Continue to monitor H&H and platelets   Jeremy Padilla R Jeremy Padilla 05/30/2019,1:50 PM

## 2019-05-30 NOTE — Telephone Encounter (Signed)
I called to inform the patient with Arna Medici NP medical advice and his wife informed that she has taking him to [facility] ED

## 2019-05-30 NOTE — Telephone Encounter (Signed)
The patient should contact his PCP for pain medication, we will be unable to give any pain medication unless it is found that his issue is related to his blood flow.  I would also suggest that if he is in that much pain, he should see if he can see his PCP today or go to the urgent care.

## 2019-05-30 NOTE — ED Triage Notes (Signed)
Says left lower leg pain since night before last when he was dragging a can.  Says recent vascular surgery by dr dew.  He called office and took suggestions of ice and ibuprofen and they did not work.  Says he is unablet o sleep due to throbbing pain.  They cannot see him til Monday.

## 2019-05-30 NOTE — ED Notes (Signed)
Heparin drip verified with Gershon Mussel, RN prior to starting.

## 2019-05-30 NOTE — ED Provider Notes (Signed)
[facility] Emergency Department Provider Note  ____________________________________________   First MD Initiated Contact with Patient 05/30/19 1318     (approximate)  I have reviewed the triage vital signs and the nursing notes.   HISTORY  Chief Complaint Leg Pain   HPI Jeremy Padilla is a 53 y.o. male who presents to the emergency department for treatment and evaluation of left lower extremity pain.   Patient had a left lower extremity angiography with stent placement approximately 6 weeks ago.  He had been doing pretty well until 2 days ago.  Patient states that he walked up to the road to track the trash can down.  He denies any specific injury.  He states that as he was walking back to the house, he developed sudden, sharp shooting pain in the front and the medial aspect of the leg. He reports that he called Dr. Bunnie Domino office. No improvement with ice, elevation, and ibuprofen that was recommended. Pain improves when the leg hangs independent from the edge of the bed and worsens with ambulation.    Past Medical History:  Diagnosis Date  . Abscess 10/05/15  . Diabetes mellitus without complication ([place])   . 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 Hyperlipidemia   . Hypertension     Patient Active Problem List   Diagnosis Date Noted  . Ischemia of left lower extremity 05/30/2019  . Atherosclerosis of native arteries of extremity with rest pain ([place]) 04/08/2019  . Lacunar infarction ([place]) 08/03/2017  . Bruit of left carotid artery 08/03/2017  . Type 2 diabetes mellitus with complication, without long-term current use of insulin ([place]) 08/02/2017  . Abscess or cellulitis, neck 10/08/2015  . Hyperglycemia 10/08/2015  . Hypertension goal BP (blood pressure) < 140/90 10/08/2015  . Heart murmur 10/08/2015    Past Surgical History:  Procedure Laterality Date  . LOWER EXTREMITY ANGIOGRAPHY Left 04/09/2019   Procedure: LOWER EXTREMITY ANGIOGRAPHY;  Surgeon: Algernon Huxley, MD;  Location: [facility];  Service: Cardiovascular;  Laterality: Left;  . WRIST SURGERY Left     Prior to Admission medications   Medication Sig Start Date End Date Taking? Authorizing Provider  amLODipine (NORVASC) 10 MG tablet Take 1 tablet (10 mg total) by mouth daily. 08/03/17  Yes Minna Merritts, MD  aspirin EC 81 MG tablet Take 81 mg by mouth daily.   Yes [provider]  atorvastatin (LIPITOR) 40 MG tablet Take 1 tablet (40 mg total) by mouth daily. 08/03/17  Yes Minna Merritts, MD  clopidogrel (PLAVIX) 75 MG tablet Take 75 mg by mouth daily.   Yes [provider]  glipiZIDE (GLUCOTROL) 10 MG tablet Take 10 mg by mouth daily before breakfast.   Yes [provider]  hydrochlorothiazide (HYDRODIURIL) 25 MG tablet Take 25 mg by mouth daily.   Yes [provider]  linagliptin (TRADJENTA) 5 MG TABS tablet Take 5 mg by mouth daily. 04/07/19  Yes [provider]  lisinopril (ZESTRIL) 40 MG tablet Take 40 mg by mouth daily.   Yes [provider]    Allergies Penicillins  Family History  Problem Relation Age of Onset  . Heart disease Father   . Hyperlipidemia Father   . Hypertension Father     Social History Social History   Tobacco Use  . Smoking status: Never Smoker  . Smokeless tobacco: Never Used  Substance Use Topics  . Alcohol use: No  . Drug use: No    Review of  Systems  Constitutional: No fever/chills Eyes: No visual changes. ENT: No sore throat. Cardiovascular: Denies chest pain. Positive for cool left lower extremity. Respiratory: Denies shortness of breath. Gastrointestinal: No abdominal pain.  No nausea, no vomiting.  No diarrhea.  No constipation. Genitourinary: Negative for dysuria. Musculoskeletal: Negative for back pain. Skin: Negative for rash, erythema, or ecchymosis. Neurological: Negative for headaches, focal weakness or  numbness.  ____________________________________________   PHYSICAL EXAM:  VITAL SIGNS: ED Triage Vitals  Enc Vitals Group     BP 05/30/19 0942 (!) 165/92     Pulse Rate 05/30/19 0942 (!) 107     Resp 05/30/19 0942 16     Temp 05/30/19 0942 98.4 F (36.9 C)     Temp Source 05/30/19 0942 Oral     SpO2 05/30/19 0942 98 %     Weight --      Height --      Head Circumference --      Peak Flow --      Pain Score 05/30/19 0943 6     Pain Loc --      Pain Edu? --      Excl. in GC? --     Constitutional: Alert and oriented. Well appearing and in no acute distress. Eyes: Conjunctivae are normal. Head: Atraumatic. Nose: No congestion/rhinnorhea. Mouth/Throat: Mucous membranes are moist.  Oropharynx non-erythematous. Neck: No stridor.   Cardiovascular: Normal rate, regular rhythm. Grossly normal heart sounds.  DP of the left lower extremity absent. Respiratory: Normal respiratory effort.  No retractions. Lungs CTAB. Gastrointestinal: Soft and nontender. No distention. No abdominal bruits. No CVA tenderness. Musculoskeletal: No lower extremity edema.  No joint effusions. Neurologic:  Normal speech and language. No gross focal neurologic deficits are appreciated. No gait instability.  Skin:  Skin is warm, dry and intact. No rash noted. Psychiatric: Mood and affect are normal. Speech and behavior are normal.  ____________________________________________   LABS (all labs ordered are listed, but only abnormal results are displayed)  Labs Reviewed  COMPREHENSIVE METABOLIC PANEL - Abnormal; Notable for the following components:      Result Value   Glucose, Bld 119 (*)    BUN 28 (*)    All other components within normal limits  GLUCOSE, CAPILLARY - Abnormal; Notable for the following components:   Glucose-Capillary 155 (*)    All other components within normal limits  GLUCOSE, CAPILLARY - Abnormal; Notable for the following components:   Glucose-Capillary 156 (*)    All other  components within normal limits  SARS CORONAVIRUS 2 (HOSPITAL ORDER, PERFORMED IN [facility])  MRSA PCR SCREENING  CBC WITH DIFFERENTIAL/PLATELET  PROTIME-INR  APTT  GLUCOSE, CAPILLARY  CBC  HIV ANTIBODY (ROUTINE TESTING W REFLEX)  BASIC METABOLIC PANEL   ____________________________________________  EKG  Not indicated. ____________________________________________  RADIOLOGY  ED MD interpretation:  No DVT  Official radiology report(s): [place] Venous Img Lower Unilateral Left  Result Date: 05/30/2019 CLINICAL DATA:  Pain, swelling x2 days.  Recent vascular procedure. EXAM: LEFT LOWER EXTREMITY VENOUS DOPPLER ULTRASOUND TECHNIQUE: Gray-scale sonography with compression, as well as color and duplex ultrasound, were performed to evaluate the deep venous system from the level of the common femoral vein through the popliteal and proximal calf veins. COMPARISON:  04/07/2019 FINDINGS: Normal compressibility of the common femoral, superficial femoral, and popliteal veins, as well as the proximal calf veins. No filling defects to suggest DVT on grayscale or color Doppler imaging. Doppler waveforms show normal direction of venous flow,  normal respiratory phasicity and response to augmentation. Survey views of the contralateral common femoral vein are unremarkable. IMPRESSION: No femoropopliteal and no calf DVT in the visualized calf veins. If clinical symptoms are inconsistent or if there are persistent or worsening symptoms, further imaging (possibly involving the iliac veins) may be warranted. Electronically Signed   By: Corlis Leak  Hassell M.D.   On: 05/30/2019 11:50    ____________________________________________   PROCEDURES  Procedure(s) performed: None  Procedures  Critical Care performed: No  ____________________________________________   INITIAL IMPRESSION / ASSESSMENT AND PLAN / ED COURSE  53 year old male presenting to the emergency department for treatment and evaluation  of pain and decreased skin temperature of the left lower extremity. See HPI for additional details. Plan will be to attempt to get ABI and arterial doppler, then call vascular on call.  ----------------------------------------- 1:35 PM on 05/30/2019 -----------------------------------------  Unable to get ABI and arterial doppler. No palpable or doppler pulse in the left foot/ankle. Consulted with Dr. Gilda CreaseSchnier who will come to the ER and evaluate the patient.  ----------------------------------------- 1:57 PM on 05/30/2019 -----------------------------------------  Vascular PA in with patient.  ----------------------------------------- 3:05 PM on 05/30/2019 -----------------------------------------  Dr. Allena KatzPatel agrees to admit with vascular consult.     ____________________________________________   FINAL CLINICAL IMPRESSION(S) / ED DIAGNOSES  Final diagnoses:  Claudication of left lower extremity (HCC)  Critical lower limb ischemia     ED Discharge Orders    None       Note:  This document was prepared using Dragon voice recognition software and may include unintentional dictation errors.    Chinita Pesterriplett, Ahmaya Ostermiller B, FNP 06/01/19 [id number]    Willy Eddyobinson, Patrick, MD 06/03/19 [phone]

## 2019-05-30 NOTE — Op Note (Signed)
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 Introduction catheter into left lower extremity 3rd order catheter placement  2. Contrast injection left lower extremity for distal runoff  3. Percutaneous transluminal angioplasty left superficial femoral and popliteal arteries to 6 mm with Lutonix drug-eluting balloon 4. Percutaneous transluminal angioplasty left tibioperoneal trunk and peroneal to 3 mm with an ultra versed balloon  5. Infusion of 10 mg TPA             6.  Mechanical thrombectomy of the left SFA and peroneal using the penumbra CAT 6 device              7.  Star close closure right common femoral arteriotomy  Anesthesia: Conscious sedation was administered under my direct supervision by the interventional radiology RN. IV Versed plus fentanyl were utilized. Continuous ECG, pulse oximetry and blood pressure was monitored throughout the entire procedure.  Conscious sedation was for a total of 1 hour 17 minutes.  Sheath: 6 [place] destination will right common femoral artery retrograde  Contrast: 85 cc  Fluoroscopy Time: 15.4 minutes  Indications: [name] presents with increasing pain of the left lower extremity.  He is status post intervention approximately 1 month ago.  He experienced the acute return of his pain approximately 2 days ago.  Presented to the emergency room where he was noted to have a cold pale foot with loss of motor function and non-dopplerable pulses.  He is being returned to the [facility] suite emergently for treatment of his acutely ischemic foot.  This suggests the patient is having limb threatening  ischemia. The risks and benefits are reviewed all questions answered patient agrees to proceed.  Procedure:Kiegan Gilman Olazabal is a 53 y.o. y.o. male who was identified and appropriate procedural time out was performed. The patient was then placed supine on the table and prepped and draped in the usual sterile fashion.   Ultrasound was placed in the sterile sleeve and the right groin was evaluated the right common femoral artery was echolucent and pulsatile indicating patency. Image was recorded for the permanent record and under real-time visualization a microneedle was inserted into the common femoral artery followed by the microwire and then the micro-sheath. A J-wire was then advanced through the micro-sheath and a 5 [place] sheath was then inserted over a J-wire.  Rim catheter was repositioned to above the bifurcation and a RAO view of the pelvis was obtained. Subsequently a rim catheter with the advantage wire was used to cross the aortic bifurcation the catheter wire were advanced down into the left distal external iliac artery. Oblique view of the femoral bifurcation was then obtained and subsequently the wire was reintroduced and the pigtail catheter negotiated into the SFA representing third order catheter placement. Distal runoff was then performed.  Diagnostic interpretation: The distal aorta Is widely patent as is the left common internal and external iliac arteries.  Common femoral and profunda femoris are patent although the profunda femoris demonstrates extensive disease in the mid and distal portions.  The proximal 4 to 5 cm of the SFA are patent with very slow flow and a greater than 70% stenosis noted.  At the leading edge of the previously placed stents the SFA occludes stents have been placed from the proximal SFA down to the distal popliteal  and there is no flow throughout the entire stented segment.  There is no flow into the tibial vessels.  Wire is negotiated through the  stented segment followed by a Kumpe catheter and this demonstrates there is thrombus within the tibioperoneal trunk as well as the proximal half peroneal.  Based on these findings I elected to proceed forward with intervention.   3000 units of heparin was then given and allowed to circulate and a 6 [place] destination sheath was advanced up and over the bifurcation and positioned in the femoral artery  The wire was reintroduced through the Kumpe catheter down into the peroneal and a infusion catheter with a 50 cm infusion length was advanced over the wire.  Wire was removed and the obturator was placed.  10 mg of TPA was then infused and allowed to dwell for approximately 20 minutes.  The penumbra CAT 6 device was then delivered onto the field and mechanical thrombectomy was performed beginning in the SFA and extending down into the mid peroneal.  At several points during the procedure given the single-vessel runoff and the wire manipulations intra-arterial nitroglycerin was instilled.  Approximately 1600 mcg total was given and multiple aliquots.  Having reestablished flow with resolution of the vast majority of the thrombus within both the femoral and the tibial distributions I elected to treat the proximal SFA lesion.  A 6 mm x 40 mm Lutonix drug-eluting balloon was advanced across this lesion inflated to 12 atm for 1 minute.  Follow-up imaging demonstrated less than 10% residual stenosis.  The tibial lesion proved to be somewhat more complicated.  I exchanged for a 0.018V 18 wire.  I selected a 3 mm x 22 cm Ultraverse balloon positioning and in the midportion of the peroneal extending proximally.  Inflation was to 12 atm for 1 full minute.  Next a 4 mm x 100 mm Lutonix drug-eluting balloon was advanced from the distal tibial peroneal trunk into the popliteal extending back into the previously placed stents.  Inflation was to 12 atm for 2 minutes.  Follow-up imaging now demonstrated flow throughout  the entire system although it was quite sluggish.  This was 1 of the indications for the intra-arterial nitroglycerin.  This did improve flow.  Having successfully treated both the femoral stricture as well as the tibial stricture and having removed the majority of the thrombus I elected to terminate the procedure and initiate Aggrastat drip.  After review of these images the sheath is pulled into the right external iliac oblique of the common femoral is obtained and a Star close device deployed. There no immediate Complications.  Findings:  The distal aorta Is widely patent as is the left common internal and external iliac arteries.  Common femoral and profunda femoris are patent although the profunda femoris demonstrates extensive disease in the mid and distal portions.  The proximal 4 to 5 cm of the SFA are patent with very slow flow and a greater than 70% stenosis noted.  At the leading edge of the previously placed stents the SFA occludes stents have been placed from the proximal SFA down to the distal popliteal and there is no flow throughout the entire stented segment.  There is no flow into the tibial vessels.  Wire is negotiated through the stented segment followed by a Kumpe catheter and this demonstrates there is thrombus within the tibioperoneal trunk as well as the proximal half peroneal.  The overwhelming majority greater than 90% of the thrombus was successfully removed with mechanical  thrombectomy and a 10 mg aliquot of TPA.  Angioplasty of the SFA to 6 mm resulted in less than 10% residual stenosis.  Angioplasty of the proximal half of the peroneal as well as the entire length of the tibioperoneal trunk and distal popliteal resulted in less than 15% residual stenosis throughout the system.  Successful reperfusion of the left lower extremity.  Summary: Successful recanalization left lower extremity for limb salvage   Disposition: Patient was taken to the  recovery room in stable condition having tolerated the procedure well.  Belenda Cruise  05/30/2019,6:09 PM

## 2019-05-31 LAB — BASIC METABOLIC PANEL
Anion gap: 8 (ref 5–15)
BUN: 24 mg/dL — ABNORMAL HIGH (ref 6–20)
CO2: 27 mmol/L (ref 22–32)
Calcium: 9.1 mg/dL (ref 8.9–10.3)
Chloride: 101 mmol/L (ref 98–111)
Creatinine, Ser: 0.93 mg/dL (ref 0.61–1.24)
GFR calc Af Amer: >60 mL/min (ref >60)
GFR calc non Af Amer: >60 mL/min (ref >60)
Glucose, Bld: 176 mg/dL — ABNORMAL HIGH (ref 70–99)
Potassium: 4.8 mmol/L (ref 3.5–5.1)
Sodium: 136 mmol/L (ref 135–145)

## 2019-05-31 LAB — CBC
HCT: 38.5 % — ABNORMAL LOW (ref 39.0–52.0)
Hemoglobin: 12.9 g/dL — ABNORMAL LOW (ref 13.0–17.0)
MCH: 28.5 pg (ref 26.0–34.0)
MCHC: 33.5 g/dL (ref 30.0–36.0)
MCV: 85 fL (ref 80.0–100.0)
Platelets: 241 10*3/uL (ref 150–400)
RBC: 4.53 MIL/uL (ref 4.22–5.81)
RDW: 13.2 % (ref 11.5–15.5)
WBC: 8.4 10*3/uL (ref 4.0–10.5)
nRBC: 0 % (ref 0.0–0.2)

## 2019-05-31 LAB — GLUCOSE, CAPILLARY
Glucose-Capillary: 170 mg/dL — ABNORMAL HIGH (ref 70–99)
Glucose-Capillary: 194 mg/dL — ABNORMAL HIGH (ref 70–99)

## 2019-05-31 MED ORDER — CLOPIDOGREL BISULFATE 75 MG PO TABS: 75.0000 mg | ORAL_TABLET | Freq: Every day | ORAL | Status: DC

## 2019-05-31 NOTE — Discharge Instructions (Signed)
Endovascular Therapy for Peripheral Arterial Disease, Care After  This sheet gives you information about how to care for yourself after your procedure. Your health care provider may also give you more specific instructions. If you have problems or questions, contact your health care provider.  What can I expect after the procedure?  After the procedure, it is common to have:   Pain.   Soreness and bruising around your puncture or incision (access site).   Fatigue.  Follow these instructions at home:  Access site care   Follow instructions from your health care provider about how to take care of your access site. Make sure you:  ? Wash your hands with soap and water before you change your bandage (dressing). If soap and water are not available, use hand sanitizer.  ? Change your dressing as told by your health care provider.  ? Leave stitches (sutures), skin glue, or adhesive strips in place. If adhesive strip edges start to loosen and curl up, you may trim the loose edges. Do not remove adhesive strips or skin glue completely unless your health care provider tells you to do that.   Check your access site every day for signs of infection. Check for:  ? Redness, swelling, or pain.  ? A lump or bump.  ? Fluid or blood.  ? Warmth.  ? Pus or a bad smell.  Medicines   Take over-the-counter and prescription medicines only as told by your health care provider. You may need to take medicines to prevent blood clots and to lower your cholesterol.   If you were prescribed antibiotic medicine, take it as told by your health care provider. Do not stop taking the antibiotic even if you start to feel better.  Driving   Do not drive until your health care provider approves. You should:  ? Not drive for 24 hours if you were given a medicine to help you relax (sedative) during your procedure.  ? Not drive or use heavy machinery while taking prescription pain medicine.  Activity   Do not lift anything that is heavier than 10  lb (4.5 kg) until your health care provider says that it is safe. You may have this lifting limit for several days.   Return to your normal activities as told by your health care provider. Ask your health care provider what activities are safe for you.  ? Avoid activity that requires a lot of energy, such as exercise and sports, as told by your health care provider.  ? Avoid sexual activity until your health care provider says it is safe.   Follow your exercise plan as told by your health care provider.  Eating and drinking     Drink fluids as instructed to help wash (flush) dye used during the procedure out of your body.   Follow instructions from your health care provider about eating or drinking restrictions. You may need to eat a diet that is low in salt (sodium) and fat.   Avoid drinking alcohol.  General instructions   Do not take baths, swim, or use a hot tub until your health care provider approves. You may take showers.   Do not use any products that contain nicotine or tobacco, such as cigarettes and e-cigarettes. If you need help quitting, ask your health care provider.   Keep all follow-up visits as told by your health care provider. This is important.  Contact a health care provider if:   You have a fever.   You   have severe pain that does not get better with medicine.   You have redness, swelling, or pain around your access site.   You have a fever.   You have a lump or bump at your access site.  Get help right away if:     You have fluid or blood coming from your access site. If this happens, lie down on your back and apply pressure to the area.   You have chest pain.   You have problems breathing.   You have pain, numbness, or tingling in your legs.   You faint.   You have any symptoms of a stroke. "BE FAST" is an easy way to remember the main warning signs of a stroke:  ? B - Balance. Signs are dizziness, sudden trouble walking, or loss of balance.  ? E - Eyes. Signs are trouble  seeing or a sudden change in vision.  ? F - Face. Signs are sudden weakness or numbness of the face, or the face or eyelid drooping on one side.  ? A - Arms. Signs are weakness or numbness in an arm. This happens suddenly and usually on one side of the body.  ? S - Speech. Signs are sudden trouble speaking, slurred speech, or trouble understanding what people say.  ? T - Time. Time to call emergency services. Write down what time symptoms started.   You have other signs of a stroke, such as:  ? A sudden, severe headache with no known cause.  ? Nausea or vomiting.  ? Seizure.  These symptoms may represent a serious problem that is an emergency. Do not wait to see if the symptoms will go away. Get medical help right away. Call your local emergency services (911 in the [place] Do not drive yourself to the hospital.  Summary   After the procedure, it is common to have pain and soreness near your puncture or incision (access site).   Check your access site every day for signs of infection, such as redness, swelling, or pain.   You may need to take medicines to prevent blood clots and to lower your cholesterol.   If you have any signs of a stroke, get help right away.  This information is not intended to replace advice given to you by your health care provider. Make sure you discuss any questions you have with your health care provider.  Document Released: 03/21/2017 Document Revised: 03/21/2017 Document Reviewed: 03/21/2017  Elsevier Interactive Patient Education  2019 [employer]

## 2019-05-31 NOTE — Progress Notes (Signed)
[facility] NAME: Jeremy Padilla    MR#:  [id number]  DATE OF BIRTH:  [birth year]  SUBJECTIVE:  CHIEF COMPLAINT:   Chief Complaint  Patient presents with  . Leg Pain   -Came in with symptoms of ischemic left leg and went for urgent angiogram and intervention yesterday.  Feels much better today.  Pulses are strong in the left leg  REVIEW OF SYSTEMS:  Review of Systems  Constitutional: Negative for chills, fever and malaise/fatigue.  HENT: Negative for ear discharge, hearing loss and nosebleeds.   Eyes: Negative for blurred vision and double vision.  Respiratory: Negative for cough, shortness of breath and wheezing.   Cardiovascular: Negative for chest pain, palpitations and leg swelling.  Gastrointestinal: Negative for abdominal pain, constipation, diarrhea, nausea and vomiting.  Genitourinary: Negative for dysuria, hematuria and urgency.  Musculoskeletal: Negative for myalgias.  Neurological: Negative for dizziness, seizures and headaches.    DRUG ALLERGIES:   Allergies  Allergen Reactions  . Penicillins Rash    .Has patient had a PCN reaction causing immediate rash, facial/tongue/throat swelling, SOB or lightheadedness with hypotension: Unknown Has patient had a PCN reaction causing severe rash involving mucus membranes or skin necrosis: Unknown Has patient had a PCN reaction that required hospitalization: Unknown Has patient had a PCN reaction occurring within the last 10 years: Unknown If all of the above answers are "NO", then may proceed with Cephalosporin use.     VITALS:  Blood pressure 138/74, pulse 91, temperature 97.8 F (36.6 C), temperature source Oral, resp. rate (!) 23, height 6\' 2"  (1.88 m), weight 82.1 kg, SpO2 98 %.  PHYSICAL EXAMINATION:  Physical Exam  GENERAL:  53 y.o.-year-old patient lying in the bed with no acute distress.  EYES: Pupils equal, round, reactive to light and accommodation. No  scleral icterus. Extraocular muscles intact.  HEENT: Head atraumatic, normocephalic. Oropharynx and nasopharynx clear.  NECK:  Supple, no jugular venous distention. No thyroid enlargement, no tenderness.  LUNGS: Normal breath sounds bilaterally, no wheezing, rales,rhonchi or crepitation. No use of accessory muscles of respiration.  CARDIOVASCULAR: S1, S2 normal. No murmurs, rubs, or gallops.  ABDOMEN: Soft, nontender, nondistended. Bowel sounds present. No organomegaly or mass.  EXTREMITIES: No pedal edema, cyanosis, or clubbing.  Both feet are warm to touch.  Left leg pulses strong, slightly weaker right DP pulse NEUROLOGIC: Cranial nerves II through XII are intact. Muscle strength 5/5 in all extremities. Sensation intact. Gait not checked.  PSYCHIATRIC: The patient is alert and oriented x 3.  SKIN: No obvious rash, lesion, or ulcer.    LABORATORY PANEL:   CBC Recent Labs  Lab 05/31/19 0554  WBC 8.4  HGB 12.9*  HCT 38.5*  PLT 241   ------------------------------------------------------------------------------------------------------------------  Chemistries  Recent Labs  Lab 05/30/19 1343 05/31/19 0554  NA 136 136  K 4.0 4.8  CL 99 101  CO2 25 27  GLUCOSE 119* 176*  BUN 28* 24*  CREATININE 0.99 0.93  CALCIUM 9.5 9.1  AST 20  --   ALT 19  --   ALKPHOS 124  --   BILITOT 0.7  --    ------------------------------------------------------------------------------------------------------------------  Cardiac Enzymes No results for input(s): TROPONINI in the last 168 hours. ------------------------------------------------------------------------------------------------------------------  RADIOLOGY:  US Venous Img Lower Unilateral Left  Result Date: 05/30/2019 CLINICAL DATA:  Pain, swelling x2 days.  Recent vascular procedure. EXAM: LEFT LOWER EXTREMITY VENOUS DOPPLER ULTRASOUND TECHNIQUE: Gray-scale sonography with compression, as well as color  and duplex ultrasound, were  performed to evaluate the deep venous system from the level of the common femoral vein through the popliteal and proximal calf veins. COMPARISON:  04/07/2019 FINDINGS: Normal compressibility of the common femoral, superficial femoral, and popliteal veins, as well as the proximal calf veins. No filling defects to suggest DVT on grayscale or color Doppler imaging. Doppler waveforms show normal direction of venous flow, normal respiratory phasicity and response to augmentation. Survey views of the contralateral common femoral vein are unremarkable. IMPRESSION: No femoropopliteal and no calf DVT in the visualized calf veins. If clinical symptoms are inconsistent or if there are persistent or worsening symptoms, further imaging (possibly involving the iliac veins) may be warranted. Electronically Signed   By: Corlis Leak  Hassell M.D.   On: 05/30/2019 11:50    EKG:   Orders placed or performed during the hospital encounter of 04/07/19  . ED EKG  . ED EKG  . EKG 12-Lead  . EKG 12-Lead    ASSESSMENT AND PLAN:   53 year old male hypertension, hyperlipidemia, diabetes, carotid artery stenosis, peripheral vascular disease with recent left lower extremity endovascular intervention presents to hospital secondary to worsening left leg pain.  1.  Ischemic left lower extremity-claudication pain started 3 days ago.  Patient had recent endovascular intervention to the left leg a few weeks ago for peripheral artery disease. -In the emergency room, he had a cold foot with sluggish capillary refill and motor deficits and so was taken to urgent angiogram.  He had angioplasty and stent placement and mechanical thrombectomy to left SFA -Feels much better now.  No pain, good pulses on palpation. -Remains on Aggrastat drip since last evening.  Plavix can be restarted this afternoon after the drip is discontinued. -Patient wishes to be discharged home today.  Discussed with vascular and they are agreeable.  2.   Hypertension-continue Norvasc and lisinopril and hydrochlorothiazide  3.  Diabetes mellitus-patient on glipizide and Tradjenta  4.  Hyperlipidemia-continue statins  Patient is independent at baseline   All the records are reviewed and case discussed with Care Management/Social Workerr. Management plans discussed with the patient, family and they are in agreement.  CODE STATUS: Full code  TOTAL TIME TAKING CARE OF THIS PATIENT: 38 minutes.   POSSIBLE D/C today , DEPENDING ON CLINICAL CONDITION.   Enid Baasadhika Amylah Will M.D on 05/31/2019 at 1:12 PM  Between 7am to 6pm - Pager - [phone]  After 6pm go to [email or web address] - Social research officer, governmentpassword EPAS [facility]  [facility]  Office  [phone]  CC: Primary care physician; Toy CookeyHeadrick, Emily, FNP

## 2019-05-31 NOTE — Progress Notes (Signed)
1 Day Post-Op   Subjective/Chief Complaint: Doing well. Without complaint. States his foot feels much better- at baseline.   Objective: Vital signs in last 24 hours: Temp:  [97.8 F (36.6 C)-98.3 F (36.8 C)] 97.8 F (36.6 C) (06/20 0800) Pulse Rate:  [83-111] 91 (06/20 1100) Resp:  [10-23] 23 (06/20 1100) BP: (112-200)/(63-144) 138/74 (06/20 1000) SpO2:  [90 %-100 %] 98 % (06/20 1100) Weight:  [82.1 kg-83.9 kg] 82.1 kg (06/19 2000) Last BM Date: (pta)  Intake/Output from previous day: 06/19 0701 - 06/20 0700 In: 969.9 [I.V.:969.9] Out: 675 [Urine:675] Intake/Output this shift: Total I/O In: 147 [I.V.:147] Out: 450 [Urine:450]  General appearance: alert and no distress Cardio: regular rate and rhythm Extremities: extremities normal, atraumatic, no cyanosis or edema, warm, calf soft, +motor,+sensory, +DP/+PT signals Right groin site- soft, no hematoma  Lab Results:  Recent Labs    05/30/19 1343 05/31/19 0554  WBC 8.5 8.4  HGB 14.2 12.9*  HCT 41.6 38.5*  PLT 253 241   BMET Recent Labs    05/30/19 1343 05/31/19 0554  NA 136 136  K 4.0 4.8  CL 99 101  CO2 25 27  GLUCOSE 119* 176*  BUN 28* 24*  CREATININE 0.99 0.93  CALCIUM 9.5 9.1   PT/INR Recent Labs    05/30/19 1343  LABPROT 13.1  INR 1.0   ABG No results for input(s): PHART, HCO3 in the last 72 hours.  Invalid input(s): PCO2, PO2  Studies/Results: US Venous Img Lower Unilateral Left  Result Date: 05/30/2019 CLINICAL DATA:  Pain, swelling x2 days.  Recent vascular procedure. EXAM: LEFT LOWER EXTREMITY VENOUS DOPPLER ULTRASOUND TECHNIQUE: Gray-scale sonography with compression, as well as color and duplex ultrasound, were performed to evaluate the deep venous system from the level of the common femoral vein through the popliteal and proximal calf veins. COMPARISON:  04/07/2019 FINDINGS: Normal compressibility of the common femoral, superficial femoral, and popliteal veins, as well as the proximal  calf veins. No filling defects to suggest DVT on grayscale or color Doppler imaging. Doppler waveforms show normal direction of venous flow, normal respiratory phasicity and response to augmentation. Survey views of the contralateral common femoral vein are unremarkable. IMPRESSION: No femoropopliteal and no calf DVT in the visualized calf veins. If clinical symptoms are inconsistent or if there are persistent or worsening symptoms, further imaging (possibly involving the iliac veins) may be warranted. Electronically Signed   By: Lucrezia Europe M.D.   On: 05/30/2019 11:50    Anti-infectives: Anti-infectives (From admission, onward)   Start     Dose/Rate Route Frequency Ordered Stop   05/30/19 1645  clindamycin (CLEOCIN) IVPB 300 mg  Status:  Discontinued     300 mg 100 mL/hr over 30 Minutes Intravenous  Once 05/30/19 1637 05/30/19 1926   05/30/19 1645  clindamycin (CLEOCIN) IVPB 300 mg  Status:  Discontinued     300 mg 100 mL/hr over 30 Minutes Intravenous  Once 05/30/19 1637 05/30/19 1717      Assessment/Plan: s/p Procedure(s): LOWER EXTREMITY INTERVENTION (N/A)  S/P: 1: Introduction catheter into left lower extremity 3rd order catheter placement  2. Contrast injection left lower extremity for distal runoff  3. Percutaneous transluminal angioplasty left superficial femoral and popliteal arteries to 6 mm with Lutonix drug-eluting balloon 4. Percutaneous transluminal angioplasty left tibioperoneal trunk and peroneal to 3 mm with an ultra versed balloon  5. Infusion of 10 mg TPA             6.  Mechanical  thrombectomy of the left SFA and peroneal using the penumbra CAT 6 device              7.  Star close closure right common femoral arteriotomy   Will resume Plavix. Patient does not want to stay tonight If ambulating and exam remains unchanged, OK to D/C this evening. Follow Up with Dr. Wyn Quakerew in 1-2 weeks.  LOS: 1 day    Eli Hosesco, Miechia  A 05/31/2019

## 2019-06-02 ENCOUNTER — Ambulatory Visit (INDEPENDENT_AMBULATORY_CARE_PROVIDER_SITE_OTHER): Payer: Self-pay | Admitting: Nurse Practitioner

## 2019-06-02 ENCOUNTER — Ambulatory Visit (INDEPENDENT_AMBULATORY_CARE_PROVIDER_SITE_OTHER): Payer: Self-pay

## 2019-06-02 ENCOUNTER — Other Ambulatory Visit: Payer: Self-pay

## 2019-06-02 ENCOUNTER — Other Ambulatory Visit (INDEPENDENT_AMBULATORY_CARE_PROVIDER_SITE_OTHER): Payer: Self-pay | Admitting: Nurse Practitioner

## 2019-06-02 ENCOUNTER — Encounter: Payer: Self-pay | Admitting: Vascular Surgery

## 2019-06-02 VITALS — BP 154/78 | HR 87 | Resp 16 | Wt 182.0 lb

## 2019-06-02 DIAGNOSIS — M79605 Pain in left leg: Secondary | ICD-10-CM

## 2019-06-02 DIAGNOSIS — Z7984 Long term (current) use of oral hypoglycemic drugs: Secondary | ICD-10-CM

## 2019-06-02 DIAGNOSIS — E1151 Type 2 diabetes mellitus with diabetic peripheral angiopathy without gangrene: Secondary | ICD-10-CM

## 2019-06-02 DIAGNOSIS — Z9862 Peripheral vascular angioplasty status: Secondary | ICD-10-CM

## 2019-06-02 DIAGNOSIS — Z79899 Other long term (current) drug therapy: Secondary | ICD-10-CM

## 2019-06-02 DIAGNOSIS — I10 Essential (primary) hypertension: Secondary | ICD-10-CM

## 2019-06-02 DIAGNOSIS — I70222 Atherosclerosis of native arteries of extremities with rest pain, left leg: Secondary | ICD-10-CM

## 2019-06-02 DIAGNOSIS — Z9889 Other specified postprocedural states: Secondary | ICD-10-CM

## 2019-06-02 DIAGNOSIS — I739 Peripheral vascular disease, unspecified: Secondary | ICD-10-CM

## 2019-06-02 DIAGNOSIS — I70202 Unspecified atherosclerosis of native arteries of extremities, left leg: Secondary | ICD-10-CM

## 2019-06-02 DIAGNOSIS — E118 Type 2 diabetes mellitus with unspecified complications: Secondary | ICD-10-CM

## 2019-06-02 NOTE — Discharge Summary (Signed)
[facility] [place] at [facility]   PATIENT NAME: Jeremy Padilla    MR#:  [id number]  DATE OF BIRTH:  01/16/1966  DATE OF ADMISSION:  05/30/2019   ADMITTING PHYSICIAN: Renford DillsGregory G Schnier, MD  DATE OF DISCHARGE: 05/31/2019  1:58 PM  PRIMARY CARE PHYSICIAN: Toy CookeyHeadrick, Emily, FNP   ADMISSION DIAGNOSIS:   Left leg pain [M79.605] Critical lower limb ischemia [I99.8] Claudication of left lower extremity (HCC) [I73.9] Left leg swelling [M79.89]  DISCHARGE DIAGNOSIS:   Active Problems:   Ischemia of left lower extremity   SECONDARY DIAGNOSIS:   Past Medical History:  Diagnosis Date  . Abscess 10/05/15  . Diabetes mellitus without complication (HCC)   . 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 Hyperlipidemia   . Hypertension     HOSPITAL COURSE:   53 year old male hypertension, hyperlipidemia, diabetes, carotid artery stenosis, peripheral vascular disease with recent left lower extremity endovascular intervention presents to hospital secondary to worsening left leg pain.  1.  Ischemic left lower extremity-claudication pain started 3 days ago prior to admission.   - Patient had recent endovascular intervention to the left leg a few weeks ago for peripheral artery disease. -In the emergency room, he had a cold foot with sluggish capillary refill and motor deficits and so was taken to urgent angiogram.  He had angioplasty and stent placement and mechanical thrombectomy to left SFA this admission -Much improved, postprocedure he was on Aggrastat drip and then changed back to Plavix at discharge. -No pain, able to ambulate well.  2.  Hypertension-continue Norvasc and lisinopril and hydrochlorothiazide  3.  Diabetes mellitus-patient on glipizide and Tradjenta  4.  Hyperlipidemia-continue statins  Patient is independent at baseline.  Discharge home today  DISCHARGE CONDITIONS:   Guarded  CONSULTS OBTAINED:   Treatment Team:  Renford DillsSchnier, Gregory G, MD  DRUG ALLERGIES:    Allergies  Allergen Reactions  . Penicillins Rash    .Has patient had a PCN reaction causing immediate rash, facial/tongue/throat swelling, SOB or lightheadedness with hypotension: Unknown Has patient had a PCN reaction causing severe rash involving mucus membranes or skin necrosis: Unknown Has patient had a PCN reaction that required hospitalization: Unknown Has patient had a PCN reaction occurring within the last 10 years: Unknown If all of the above answers are "NO", then may proceed with Cephalosporin use.    DISCHARGE MEDICATIONS:   Allergies as of 05/31/2019      Reactions   Penicillins Rash   .Has patient had a PCN reaction causing immediate rash, facial/tongue/throat swelling, SOB or lightheadedness with hypotension: Unknown Has patient had a PCN reaction causing severe rash involving mucus membranes or skin necrosis: Unknown Has patient had a PCN reaction that required hospitalization: Unknown Has patient had a PCN reaction occurring within the last 10 years: Unknown If all of the above answers are "NO", then may proceed with Cephalosporin use.      Medication List    TAKE these medications   amLODipine 10 MG tablet Commonly known as: NORVASC Take 1 tablet (10 mg total) by mouth daily.   aspirin EC 81 MG tablet Take 81 mg by mouth daily.   atorvastatin 40 MG tablet Commonly known as: LIPITOR Take 1 tablet (40 mg total) by mouth daily.   clopidogrel 75 MG tablet Commonly known as: PLAVIX Take 75 mg by mouth daily.   glipiZIDE 10 MG tablet Commonly known as: GLUCOTROL Take 10 mg by mouth daily before breakfast.   hydrochlorothiazide 25 MG tablet Commonly  known as: HYDRODIURIL Take 25 mg by mouth daily.   lisinopril 40 MG tablet Commonly known as: ZESTRIL Take 40 mg by mouth daily.   Tradjenta 5 MG Tabs tablet Generic drug: linagliptin Take 5 mg by mouth daily.        DISCHARGE INSTRUCTIONS:   1.  PCP follow-up in 1 to 2 weeks 2.  Vascular  follow-up in 1 to 2 weeks  DIET:   Cardiac diet  ACTIVITY:   Activity as tolerated  OXYGEN:   Home Oxygen: No.  Oxygen Delivery: room air  DISCHARGE LOCATION:   home   If you experience worsening of your admission symptoms, develop shortness of breath, life threatening emergency, suicidal or homicidal thoughts you must seek medical attention immediately by calling 911 or calling your MD immediately  if symptoms less severe.  You Must read complete instructions/literature along with all the possible adverse reactions/side effects for all the Medicines you take and that have been prescribed to you. Take any new Medicines after you have completely understood and accpet all the possible adverse reactions/side effects.   Please note  You were cared for by a hospitalist during your hospital stay. If you have any questions about your discharge medications or the care you received while you were in the hospital after you are discharged, you can call the unit and asked to speak with the hospitalist on call if the hospitalist that took care of you is not available. Once you are discharged, your primary care physician will handle any further medical issues. Please note that NO REFILLS for any discharge medications will be authorized once you are discharged, as it is imperative that you return to your primary care physician (or establish a relationship with a primary care physician if you do not have one) for your aftercare needs so that they can reassess your need for medications and monitor your lab values.    On the day of Discharge:  VITAL SIGNS:   Blood pressure (!) 155/95, pulse 98, temperature 97.8 F (36.6 C), temperature source Oral, resp. rate (!) 26, height 6\' 2"  (1.88 m), weight 82.1 kg, SpO2 98 %.  PHYSICAL EXAMINATION:     GENERAL:  53 y.o.-year-old patient lying in the bed with no acute distress.  EYES: Pupils equal, round, reactive to light and accommodation. No scleral  icterus. Extraocular muscles intact.  HEENT: Head atraumatic, normocephalic. Oropharynx and nasopharynx clear.  NECK:  Supple, no jugular venous distention. No thyroid enlargement, no tenderness.  LUNGS: Normal breath sounds bilaterally, no wheezing, rales,rhonchi or crepitation. No use of accessory muscles of respiration.  CARDIOVASCULAR: S1, S2 normal. No murmurs, rubs, or gallops.  ABDOMEN: Soft, nontender, nondistended. Bowel sounds present. No organomegaly or mass.  EXTREMITIES: No pedal edema, cyanosis, or clubbing.  Both feet are warm to touch.  Left leg pulses strong, slightly weaker right DP pulse NEUROLOGIC: Cranial nerves II through XII are intact. Muscle strength 5/5 in all extremities. Sensation intact. Gait not checked.  PSYCHIATRIC: The patient is alert and oriented x 3.  SKIN: No obvious rash, lesion, or ulcer.   DATA REVIEW:   CBC Recent Labs  Lab 05/31/19 0554  WBC 8.4  HGB 12.9*  HCT 38.5*  PLT 241    Chemistries  Recent Labs  Lab 05/30/19 1343 05/31/19 0554  NA 136 136  K 4.0 4.8  CL 99 101  CO2 25 27  GLUCOSE 119* 176*  BUN 28* 24*  CREATININE 0.99 0.93  CALCIUM 9.5 9.1  AST 20  --   ALT 19  --   ALKPHOS 124  --   BILITOT 0.7  --      Microbiology Results  Results for orders placed or performed during the hospital encounter of 05/30/19  SARS Coronavirus 2 (CEPHEID - Performed in [place] hospital lab), Hosp Order     Status: None   Collection Time: 05/30/19  1:43 PM   Specimen: Nasopharyngeal Swab  Result Value Ref Range Status   SARS Coronavirus 2 NEGATIVE NEGATIVE Final    Comment: (NOTE) If result is NEGATIVE SARS-CoV-2 target nucleic acids are NOT DETECTED. The SARS-CoV-2 RNA is generally detectable in upper and lower  respiratory specimens during the acute phase of infection. The lowest  concentration of SARS-CoV-2 viral copies this assay can detect is 250  copies / mL. A negative result does not preclude SARS-CoV-2 infection  and  should not be used as the sole basis for treatment or other  patient management decisions.  A negative result may occur with  improper specimen collection / handling, submission of specimen other  than nasopharyngeal swab, presence of viral mutation(s) within the  areas targeted by this assay, and inadequate number of viral copies  (<250 copies / mL). A negative result must be combined with clinical  observations, patient history, and epidemiological information. If result is POSITIVE SARS-CoV-2 target nucleic acids are DETECTED. The SARS-CoV-2 RNA is generally detectable in upper and lower  respiratory specimens dur ing the acute phase of infection.  Positive  results are indicative of active infection with SARS-CoV-2.  Clinical  correlation with patient history and other diagnostic information is  necessary to determine patient infection status.  Positive results do  not rule out bacterial infection or co-infection with other viruses. If result is PRESUMPTIVE POSTIVE SARS-CoV-2 nucleic acids MAY BE PRESENT.   A presumptive positive result was obtained on the submitted specimen  and confirmed on repeat testing.  While 2019 novel coronavirus  (SARS-CoV-2) nucleic acids may be present in the submitted sample  additional confirmatory testing may be necessary for epidemiological  and / or clinical management purposes  to differentiate between  SARS-CoV-2 and other Sarbecovirus currently known to infect humans.  If clinically indicated additional testing with an alternate test  methodology [phone]) is advised. The SARS-CoV-2 RNA is generally  detectable in upper and lower respiratory sp ecimens during the acute  phase of infection. The expected result is Negative. Fact Sheet for Patients:  StrictlyIdeas.no Fact Sheet for Healthcare Providers: BankingDealers.co.za This test is not yet approved or cleared by the [place] FDA and has been  authorized for detection and/or diagnosis of SARS-CoV-2 by FDA under an Emergency Use Authorization (EUA).  This EUA will remain in effect (meaning this test can be used) for the duration of the COVID-19 declaration under Section 564(b)(1) of the Act, 21 U.S.C. section 360bbb-3(b)(1), unless the authorization is terminated or revoked sooner. Performed at [facility], [address] [place], [place] [address]   MRSA PCR Screening     Status: None   Collection Time: 05/30/19  7:53 PM   Specimen: Nasal Mucosa; Nasopharyngeal  Result Value Ref Range Status   MRSA by PCR NEGATIVE NEGATIVE Final    Comment:        The GeneXpert MRSA Assay (FDA approved for NASAL specimens only), is one component of a comprehensive MRSA colonization surveillance program. It is not intended to diagnose MRSA infection nor to guide or monitor treatment for MRSA infections.  Performed at [facility], [address] [place], [place] [address]     RADIOLOGY:  No results found.   Management plans discussed with the patient, family and they are in agreement.  CODE STATUS:  Code Status History    Date Active Date Inactive Code Status Order ID Comments User Context   05/30/2019 1933 05/31/2019 1704 Full Code [id number]  Shaune Pollackhen, Qing, MD Inpatient   05/30/2019 1806 05/30/2019 1933 Full Code [id number]  Renford DillsSchnier, Gregory G, MD Inpatient   04/09/2019 1141 04/09/2019 2018 Full Code [id number]  Annice Needyew, Jason S, MD Inpatient   Advance Care Planning Activity      TOTAL TIME TAKING CARE OF THIS PATIENT: 38 minutes.    Enid Baasadhika Shontez Sermon M.D on 06/02/2019 at 11:05 AM  Between 7am to 6pm - Pager - [phone]  After 6pm go to [email or web address] - Social research officer, governmentpassword [email or web address] [email or web address]  [facility] [place] [facility]  Office  [phone]  CC: Primary care physician; Toy CookeyHeadrick, Emily, FNP   Note: This dictation was prepared with Dragon dictation along with smaller phrase technology. Any transcriptional errors  that result from this process are unintentional.

## 2019-06-02 NOTE — Progress Notes (Signed)
SUBJECTIVE:  Patient ID: Jeremy Padilla, male    DOB: 09/17/1966, 53 y.o.   MRN: [id number] Chief Complaint  Patient presents with  . Follow-up    ultrasound follow up    HPI  Jeremy Padilla is a 53 y.o. male  The patient returns to the office for followup and review of the noninvasive studies.  The patient recently underwent left lower extremity angiogram on 05/30/2019 due to an ischemic leg.  Today the leg is warm and capillary refill is good.  Patient denies complaints of rest pain and claudication that he had prior to intervention.  Patient states that overall his leg feels much better.  There have been no significant changes to the patient's overall health care.  The patient denies amaurosis fugax or recent TIA symptoms. There are no recent neurological changes noted. The patient denies history of DVT, PE or superficial thrombophlebitis. The patient denies recent episodes of angina or shortness of breath.   ABI Rt=0.58 and Lt=0.86  (previous ABI's Rt=0.58 and Lt=1.01) Duplex ultrasound of the left lower extremity reveals triphasic waveforms down to the level of the tibial arteries.  The posterior tibial artery is occluded with biphasic waveforms within the anterior tibial artery with monophasic within the peroneal.  The patient has strong left toe waveforms.  The right lower extremity has strong monophasic waveforms with good toe waveforms.  Past Medical History:  Diagnosis Date  . Abscess 10/05/15  . Diabetes mellitus without complication (HCC)   . 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 Hyperlipidemia   . Hypertension     Past Surgical History:  Procedure Laterality Date  . LOWER EXTREMITY ANGIOGRAPHY Left 04/09/2019   Procedure: LOWER EXTREMITY ANGIOGRAPHY;  Surgeon: Annice Needyew, Jason S, MD;  Location: [facility] INVASIVE CV LAB;  Service: Cardiovascular;  Laterality: Left;  . LOWER EXTREMITY INTERVENTION N/A 05/30/2019   Procedure: LOWER EXTREMITY INTERVENTION;  Surgeon: Renford DillsSchnier, Gregory G, MD;   Location: [facility] INVASIVE CV LAB;  Service: Cardiovascular;  Laterality: N/A;  . WRIST SURGERY Left     Social History   Socioeconomic History  . Marital status: Married    Spouse name: Not on file  . Number of children: Not on file  . Years of education: Not on file  . Highest education level: Not on file  Occupational History  . Not on file  Social Needs  . Financial resource strain: Not on file  . Food insecurity    Worry: Not on file    Inability: Not on file  . Transportation needs    Medical: Not on file    Non-medical: Not on file  Tobacco Use  . Smoking status: Never Smoker  . Smokeless tobacco: Never Used  Substance and Sexual Activity  . Alcohol use: No  . Drug use: No  . Sexual activity: Not on file  Lifestyle  . Physical activity    Days per week: Not on file    Minutes per session: Not on file  . Stress: Not on file  Relationships  . Social Musicianconnections    Talks on phone: Not on file    Gets together: Not on file    Attends religious service: Not on file    Active member of club or organization: Not on file    Attends meetings of clubs or organizations: Not on file    Relationship status: Not on file  . Intimate partner violence    Fear of current or ex partner: Not on file  Emotionally abused: Not on file    Physically abused: Not on file    Forced sexual activity: Not on file  Other Topics Concern  . Not on file  Social History Narrative  . Not on file    Family History  Problem Relation Age of Onset  . Heart disease Father   . Hyperlipidemia Father   . Hypertension Father     Allergies  Allergen Reactions  . Penicillins Rash    .Has patient had a PCN reaction causing immediate rash, facial/tongue/throat swelling, SOB or lightheadedness with hypotension: Unknown Has patient had a PCN reaction causing severe rash involving mucus membranes or skin necrosis: Unknown Has patient had a PCN reaction that required hospitalization: Unknown Has  patient had a PCN reaction occurring within the last 10 years: Unknown If all of the above answers are "NO", then may proceed with Cephalosporin use.      Review of Systems   Review of Systems: Negative Unless Checked Constitutional: [] Weight loss  [] Fever  [] Chills Cardiac: [] Chest pain   []  Atrial Fibrillation  [] Palpitations   [] Shortness of breath when laying flat   [] Shortness of breath with exertion. [] Shortness of breath at rest Vascular:  [] Pain in legs with walking   [] Pain in legs with standing [] Pain in legs when laying flat   [x] Claudication    [] Pain in feet when laying flat    [] History of DVT   [] Phlebitis   [] Swelling in legs   [] Varicose veins   [] Non-healing ulcers Pulmonary:   [] Uses home oxygen   [] Productive cough   [] Hemoptysis   [] Wheeze  [] COPD   [] Asthma Neurologic:  [] Dizziness   [] Seizures  [] Blackouts [] History of stroke   [] History of TIA  [] Aphasia   [] Temporary Blindness   [] Weakness or numbness in arm   [] Weakness or numbness in leg Musculoskeletal:   [] Joint swelling   [] Joint pain   [] Low back pain  []  History of Knee Replacement [] Arthritis [] back Surgeries  []  Spinal Stenosis    Hematologic:  [] Easy bruising  [] Easy bleeding   [] Hypercoagulable state   [] Anemic Gastrointestinal:  [] Diarrhea   [] Vomiting  [] Gastroesophageal reflux/heartburn   [] Difficulty swallowing. [] Abdominal pain Genitourinary:  [] Chronic kidney disease   [] Difficult urination  [] Anuric   [] Blood in urine [] Frequent urination  [] Burning with urination   [] Hematuria Skin:  [] Rashes   [] Ulcers [] Wounds Psychological:  [] History of anxiety   []  History of major depression  []  Memory Difficulties      OBJECTIVE:   Physical Exam  BP (!) 154/78 (BP Location: Right Arm)   Pulse 87   Resp 16   Wt 182 lb (82.6 kg)   BMI 23.37 kg/m   Gen: WD/WN, NAD Head: [place]/AT, No temporalis wasting.  Ear/Nose/Throat: Hearing grossly intact, nares w/o erythema or drainage Eyes: PER, EOMI, sclera  nonicteric.  Neck: Supple, no masses.  No JVD.  Pulmonary:  Good air movement, no use of accessory muscles.  Cardiac: RRR Vascular:  Vessel Right Left  Radial Palpable Palpable  Dorsalis Pedis Not Palpable tracePalpable  Posterior Tibial Not Palpable Not Palpable   Gastrointestinal: soft, non-distended. No guarding/no peritoneal signs.  Musculoskeletal: M/S 5/5 throughout.  No deformity or atrophy.  Neurologic: Pain and light touch intact in extremities.  Symmetrical.  Speech is fluent. Motor exam as listed above. Psychiatric: Judgment intact, Mood & affect appropriate for pt's clinical situation. Dermatologic: No Venous rashes. No Ulcers Noted.  No changes consistent with cellulitis. Lymph : No Cervical lymphadenopathy, no  lichenification or skin changes of chronic lymphedema.       ASSESSMENT AND PLAN:  1. Atherosclerosis of native artery of left lower extremity with rest pain [facility]) Recommend:  The patient is status post successful angiogram with intervention.  The patient reports that the claudication symptoms and leg pain is essentially gone.   The patient denies lifestyle limiting changes at this point in time.  No further invasive studies, angiography or surgery at this time The patient should continue walking and begin a more formal exercise program.  The patient should continue antiplatelet therapy and aggressive treatment of the lipid abnormalities  Smoking cessation was again discussed  The patient should continue wearing graduated compression socks 10-15 mmHg strength to control the mild edema.  Patient should undergo noninvasive studies as ordered. The patient will follow up with me after the studies.    - VAS [place] ABI WITH/WO TBI; Future - VAS [place] LOWER EXTREMITY ARTERIAL DUPLEX; Future  2. Type 2 diabetes mellitus with complication, without long-term current use of insulin (HCC) Continue hypoglycemic medications as already ordered, these medications have been  reviewed and there are no changes at this time.  Hgb A1C to be monitored as already arranged by primary service   3. Hypertension goal BP (blood pressure) < 140/90 Continue antihypertensive medications as already ordered, these medications have been reviewed and there are no changes at this time.    Current Outpatient Medications on File Prior to Visit  Medication Sig Dispense Refill  . amLODipine (NORVASC) 10 MG tablet Take 1 tablet (10 mg total) by mouth daily. 90 tablet 4  . aspirin EC 81 MG tablet Take 81 mg by mouth daily.    Marland Kitchen atorvastatin (LIPITOR) 40 MG tablet Take 1 tablet (40 mg total) by mouth daily. 90 tablet 3  . clopidogrel (PLAVIX) 75 MG tablet Take 75 mg by mouth daily.    Marland Kitchen glipiZIDE (GLUCOTROL) 10 MG tablet Take 10 mg by mouth daily before breakfast.    . hydrochlorothiazide (HYDRODIURIL) 25 MG tablet Take 25 mg by mouth daily.    Marland Kitchen linagliptin (TRADJENTA) 5 MG TABS tablet Take 5 mg by mouth daily.    Marland Kitchen lisinopril (ZESTRIL) 40 MG tablet Take 40 mg by mouth daily.     No current facility-administered medications on file prior to visit.     There are no Patient Instructions on file for this visit. No follow-ups on file.   Kris Hartmann, NP  This note was completed with Sales executive.  Any errors are purely unintentional.

## 2019-06-03 LAB — HIV ANTIBODY (ROUTINE TESTING W REFLEX): HIV Screen 4th Generation wRfx: NONREACTIVE

## 2019-06-05 ENCOUNTER — Telehealth (INDEPENDENT_AMBULATORY_CARE_PROVIDER_SITE_OTHER): Payer: Self-pay

## 2019-06-05 NOTE — Telephone Encounter (Signed)
Patient will coming in the office 06/06/19@10 :30 for ABI

## 2019-06-05 NOTE — Telephone Encounter (Signed)
We can bring the patient in for ABIs, hopefully sometime tomorrow.  If we can at least get an ultrasound to be sure there are no significant changes . If necessary me or Lucky Cowboy can work him in if there are significant changes.

## 2019-06-06 ENCOUNTER — Ambulatory Visit (INDEPENDENT_AMBULATORY_CARE_PROVIDER_SITE_OTHER): Payer: Self-pay

## 2019-06-06 ENCOUNTER — Other Ambulatory Visit: Payer: Self-pay

## 2019-06-06 ENCOUNTER — Other Ambulatory Visit (INDEPENDENT_AMBULATORY_CARE_PROVIDER_SITE_OTHER): Payer: Self-pay | Admitting: Nurse Practitioner

## 2019-06-06 DIAGNOSIS — M79672 Pain in left foot: Secondary | ICD-10-CM

## 2019-06-06 DIAGNOSIS — Z9862 Peripheral vascular angioplasty status: Secondary | ICD-10-CM

## 2019-06-06 DIAGNOSIS — I739 Peripheral vascular disease, unspecified: Secondary | ICD-10-CM

## 2019-06-06 DIAGNOSIS — Z95820 Peripheral vascular angioplasty status with implants and grafts: Secondary | ICD-10-CM

## 2019-06-06 MED ORDER — TRAMADOL HCL 50 MG PO TABS: 50.0000 mg | ORAL_TABLET | Freq: Four times a day (QID) | ORAL | 0 refills | Status: DC | PRN

## 2019-08-08 ENCOUNTER — Ambulatory Visit (INDEPENDENT_AMBULATORY_CARE_PROVIDER_SITE_OTHER): Payer: MEDICAID | Admitting: Nurse Practitioner

## 2019-08-08 ENCOUNTER — Encounter (INDEPENDENT_AMBULATORY_CARE_PROVIDER_SITE_OTHER): Payer: Self-pay

## 2019-08-08 ENCOUNTER — Encounter (INDEPENDENT_AMBULATORY_CARE_PROVIDER_SITE_OTHER): Payer: MEDICAID

## 2019-09-03 ENCOUNTER — Other Ambulatory Visit (INDEPENDENT_AMBULATORY_CARE_PROVIDER_SITE_OTHER): Payer: Self-pay | Admitting: Nurse Practitioner

## 2019-09-03 ENCOUNTER — Other Ambulatory Visit: Payer: Self-pay

## 2019-09-03 ENCOUNTER — Ambulatory Visit (INDEPENDENT_AMBULATORY_CARE_PROVIDER_SITE_OTHER): Payer: Self-pay | Admitting: Nurse Practitioner

## 2019-09-03 ENCOUNTER — Encounter (INDEPENDENT_AMBULATORY_CARE_PROVIDER_SITE_OTHER): Payer: Self-pay | Admitting: Nurse Practitioner

## 2019-09-03 ENCOUNTER — Ambulatory Visit (INDEPENDENT_AMBULATORY_CARE_PROVIDER_SITE_OTHER): Payer: Self-pay

## 2019-09-03 VITALS — BP 160/84 | HR 73 | Resp 17 | Ht 74.0 in | Wt 188.0 lb

## 2019-09-03 DIAGNOSIS — I70222 Atherosclerosis of native arteries of extremities with rest pain, left leg: Secondary | ICD-10-CM

## 2019-09-03 DIAGNOSIS — I10 Essential (primary) hypertension: Secondary | ICD-10-CM

## 2019-09-03 DIAGNOSIS — E118 Type 2 diabetes mellitus with unspecified complications: Secondary | ICD-10-CM

## 2019-09-03 NOTE — Progress Notes (Signed)
SUBJECTIVE:  Patient ID: Jeremy Padilla, male    DOB: 1966/09/04, 53 y.o.   MRN: [id number] Chief Complaint  Patient presents with  . Follow-up    ultrasound    HPI  Jeremy Padilla is a 53 y.o. male The patient returns to the office for followup and review of the noninvasive studies. There have been no interval changes in lower extremity symptoms. No interval shortening of the patient's claudication distance or development of rest pain symptoms. No new ulcers or wounds have occurred since the last visit.  There have been no significant changes to the patient's overall health care.  The patient denies amaurosis fugax or recent TIA symptoms. There are no recent neurological changes noted. The patient denies history of DVT, PE or superficial thrombophlebitis. The patient denies recent episodes of angina or shortness of breath.   ABI Rt=0.73 and Lt=0.81  (previous ABI's Rt=0.59 and Lt=1.03) Duplex ultrasound of the bilateral lower extremities reveals triphasic waveforms through to the level of the tibial arteries on the left lower extremity.  The posterior tibial artery is occluded with monophasic waveforms in the anterior tibial artery and the peroneal artery.  However the patient does have good toe waveforms on the left lower extremity.  The right lower extremity has triphasic waveforms until the level of the proximal SFA.  It is noted on the ultrasound report that there is a stent in the proximal SFA however the patient states that he has never had any intervention within his right lower extremity.  The mid SFA has a short segment occlusion.  The distal posterior tibial artery is also occluded.  The patient then has monophasic flow throughout the distal SFA through to the tibial arteries.  He does have a collateral within the mid to distal segment.  The patient also has strong right digit waveforms.  Past Medical History:  Diagnosis Date  . Abscess 10/05/15  . Diabetes mellitus  without complication ([place])   . 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 Hyperlipidemia   . Hypertension     Past Surgical History:  Procedure Laterality Date  . LOWER EXTREMITY ANGIOGRAPHY Left 04/09/2019   Procedure: LOWER EXTREMITY ANGIOGRAPHY;  Surgeon: Algernon Huxley, MD;  Location: [facility];  Service: Cardiovascular;  Laterality: Left;  . LOWER EXTREMITY INTERVENTION N/A 05/30/2019   Procedure: LOWER EXTREMITY INTERVENTION;  Surgeon: Katha Cabal, MD;  Location: [facility];  Service: Cardiovascular;  Laterality: N/A;  . WRIST SURGERY Left     Social History   Socioeconomic History  . Marital status: Married    Spouse name: Not on file  . Number of children: Not on file  . Years of education: Not on file  . Highest education level: Not on file  Occupational History  . Not on file  Social Needs  . Financial resource strain: Not on file  . Food insecurity    Worry: Not on file    Inability: Not on file  . Transportation needs    Medical: Not on file    Non-medical: Not on file  Tobacco Use  . Smoking status: Never Smoker  . Smokeless tobacco: Never Used  Substance and Sexual Activity  . Alcohol use: No  . Drug use: No  . Sexual activity: Not on file  Lifestyle  . Physical activity    Days per week: Not on file    Minutes per session: Not on file  . Stress: Not on file  Relationships  .  Social Musician on phone: Not on file    Gets together: Not on file    Attends religious service: Not on file    Active member of club or organization: Not on file    Attends meetings of clubs or organizations: Not on file    Relationship status: Not on file  . Intimate partner violence    Fear of current or ex partner: Not on file    Emotionally abused: Not on file    Physically abused: Not on file    Forced sexual activity: Not on file  Other Topics Concern  . Not on file  Social History Narrative  . Not on file    Family History  Problem Relation Age  of Onset  . Heart disease Father   . Hyperlipidemia Father   . Hypertension Father     Allergies  Allergen Reactions  . Penicillins Rash    .Has patient had a PCN reaction causing immediate rash, facial/tongue/throat swelling, SOB or lightheadedness with hypotension: Unknown Has patient had a PCN reaction causing severe rash involving mucus membranes or skin necrosis: Unknown Has patient had a PCN reaction that required hospitalization: Unknown Has patient had a PCN reaction occurring within the last 10 years: Unknown If all of the above answers are "NO", then may proceed with Cephalosporin use.      Review of Systems   Review of Systems: Negative Unless Checked Constitutional: [] Weight loss  [] Fever  [] Chills Cardiac: [] Chest pain   []  Atrial Fibrillation  [] Palpitations   [] Shortness of breath when laying flat   [] Shortness of breath with exertion. [] Shortness of breath at rest Vascular:  [] Pain in legs with walking   [] Pain in legs with standing [] Pain in legs when laying flat   [x] Claudication    [] Pain in feet when laying flat    [] History of DVT   [] Phlebitis   [] Swelling in legs   [] Varicose veins   [] Non-healing ulcers Pulmonary:   [] Uses home oxygen   [] Productive cough   [] Hemoptysis   [] Wheeze  [] COPD   [] Asthma Neurologic:  [] Dizziness   [] Seizures  [] Blackouts [] History of stroke   [] History of TIA  [] Aphasia   [] Temporary Blindness   [] Weakness or numbness in arm   [] Weakness or numbness in leg Musculoskeletal:   [] Joint swelling   [] Joint pain   [] Low back pain  []  History of Knee Replacement [x] Arthritis [] back Surgeries  []  Spinal Stenosis    Hematologic:  [] Easy bruising  [] Easy bleeding   [] Hypercoagulable state   [] Anemic Gastrointestinal:  [] Diarrhea   [] Vomiting  [] Gastroesophageal reflux/heartburn   [] Difficulty swallowing. [] Abdominal pain Genitourinary:  [] Chronic kidney disease   [] Difficult urination  [] Anuric   [] Blood in urine [] Frequent urination  [] Burning  with urination   [] Hematuria Skin:  [] Rashes   [] Ulcers [] Wounds Psychological:  [] History of anxiety   []  History of major depression  []  Memory Difficulties      OBJECTIVE:   Physical Exam  BP (!) 160/84 (BP Location: Right Arm)   Pulse 73   Resp 17   Ht 6\' 2"  (1.88 m)   Wt 188 lb (85.3 kg)   BMI 24.14 kg/m   Gen: WD/WN, NAD Head: [place]/AT, No temporalis wasting.  Ear/Nose/Throat: Hearing grossly intact, nares w/o erythema or drainage Eyes: PER, EOMI, sclera nonicteric.  Neck: Supple, no masses.  No JVD.  Pulmonary:  Good air movement, no use of accessory muscles.  Cardiac: RRR Vascular:  Vessel Right  Left  Radial Palpable Palpable  Dorsalis Pedis Palpable Palpable  Posterior Tibial Palpable Palpable   Gastrointestinal: soft, non-distended. No guarding/no peritoneal signs.  Musculoskeletal: M/S 5/5 throughout.  No deformity or atrophy.  Neurologic: Pain and light touch intact in extremities.  Symmetrical.  Speech is fluent. Motor exam as listed above. Psychiatric: Judgment intact, Mood & affect appropriate for pt's clinical situation. Dermatologic: No Venous rashes. No Ulcers Noted.  No changes consistent with cellulitis. Lymph : No Cervical lymphadenopathy, no lichenification or skin changes of chronic lymphedema.       ASSESSMENT AND PLAN:  1. Atherosclerosis of native artery of left lower extremity with rest pain (HCC) I had a discussion with the patient regarding undergoing an angiogram on his right lower extremity due to the short segment occlusion.  However at this time the patient denies any true lifestyle limiting claudication symptoms and wishes to wait on undergoing any invasive treatment methods.  However the patient does agree to close follow-up.  The patient is advised that if he should experience any worsening of his claudication distance, rest pain, worsening extreme pain or ulceration of his lower extremities he should contact us as soon as possible otherwise  we will see the patient back in 6 months with noninvasive studies.  Alternatively also had a discussion with the patient as to how this may affect erectile dysfunction.  Based upon the patient's noninvasive studies he had triphasic waveforms bilaterally until he reached the level of the SFA.  Advised patient to follow-up with PCP for other diagnostic studies that may indicate a cause for the ED.  If they are unable to find a cause we can also perform a duplex of the iliac arteries to rule out any stenosis in this area that could be causing issues as well. 2. Type 2 diabetes mellitus with complication, without long-term current use of insulin (HCC) Continue hypoglycemic medications as already ordered, these medications have been reviewed and there are no changes at this time.  Hgb A1C to be monitored as already arranged by primary service   3. Hypertension goal BP (blood pressure) < 140/90 Continue antihypertensive medications as already ordered, these medications have been reviewed and there are no changes at this time.    Current Outpatient Medications on File Prior to Visit  Medication Sig Dispense Refill  . amLODipine (NORVASC) 10 MG tablet Take 1 tablet (10 mg total) by mouth daily. 90 tablet 4  . aspirin EC 81 MG tablet Take 81 mg by mouth daily.    Marland Kitchen atorvastatin (LIPITOR) 40 MG tablet Take 1 tablet (40 mg total) by mouth daily. 90 tablet 3  . clopidogrel (PLAVIX) 75 MG tablet Take 75 mg by mouth daily.    Marland Kitchen glipiZIDE (GLUCOTROL) 10 MG tablet Take 10 mg by mouth daily before breakfast.    . hydrochlorothiazide (HYDRODIURIL) 25 MG tablet Take 25 mg by mouth daily.    Marland Kitchen linagliptin (TRADJENTA) 5 MG TABS tablet Take 5 mg by mouth daily.    Marland Kitchen lisinopril (ZESTRIL) 40 MG tablet Take 40 mg by mouth daily.    . traMADol (ULTRAM) 50 MG tablet Take 1 tablet (50 mg total) by mouth every 6 (six) hours as needed for moderate pain or severe pain. 20 tablet 0   No current facility-administered  medications on file prior to visit.     There are no Patient Instructions on file for this visit. No follow-ups on file.   Georgiana Spinner, NP  This note was completed  with Sales executive.  Any errors are purely unintentional.

## 2020-03-05 ENCOUNTER — Ambulatory Visit (INDEPENDENT_AMBULATORY_CARE_PROVIDER_SITE_OTHER): Payer: Self-pay | Admitting: Vascular Surgery

## 2020-03-05 ENCOUNTER — Encounter (INDEPENDENT_AMBULATORY_CARE_PROVIDER_SITE_OTHER): Payer: Self-pay

## 2020-03-18 ENCOUNTER — Encounter (INDEPENDENT_AMBULATORY_CARE_PROVIDER_SITE_OTHER): Payer: Self-pay | Admitting: Vascular Surgery

## 2020-03-27 ENCOUNTER — Emergency Department: Payer: HRSA Program

## 2020-03-27 ENCOUNTER — Other Ambulatory Visit: Payer: Self-pay

## 2020-03-27 ENCOUNTER — Encounter: Payer: Self-pay | Admitting: Emergency Medicine

## 2020-03-27 ENCOUNTER — Emergency Department
Admission: EM | Admit: 2020-03-27 | Discharge: 2020-03-27 | Disposition: A | Discharge status: Home / Self Care | Payer: HRSA Program | Attending: Emergency Medicine | Admitting: Emergency Medicine

## 2020-03-27 DIAGNOSIS — U071 COVID-19: Secondary | ICD-10-CM | POA: Insufficient documentation

## 2020-03-27 DIAGNOSIS — Z7982 Long term (current) use of aspirin: Secondary | ICD-10-CM | POA: Diagnosis not present

## 2020-03-27 DIAGNOSIS — Z7984 Long term (current) use of oral hypoglycemic drugs: Secondary | ICD-10-CM | POA: Insufficient documentation

## 2020-03-27 DIAGNOSIS — I10 Essential (primary) hypertension: Secondary | ICD-10-CM | POA: Diagnosis not present

## 2020-03-27 DIAGNOSIS — I2510 Atherosclerotic heart disease of native coronary artery without angina pectoris: Secondary | ICD-10-CM | POA: Diagnosis not present

## 2020-03-27 DIAGNOSIS — E119 Type 2 diabetes mellitus without complications: Secondary | ICD-10-CM | POA: Diagnosis not present

## 2020-03-27 DIAGNOSIS — E860 Dehydration: Secondary | ICD-10-CM | POA: Insufficient documentation

## 2020-03-27 DIAGNOSIS — Z7902 Long term (current) use of antithrombotics/antiplatelets: Secondary | ICD-10-CM | POA: Diagnosis not present

## 2020-03-27 DIAGNOSIS — Z79899 Other long term (current) drug therapy: Secondary | ICD-10-CM | POA: Diagnosis not present

## 2020-03-27 DIAGNOSIS — R531 Weakness: Secondary | ICD-10-CM | POA: Diagnosis present

## 2020-03-27 LAB — COMPREHENSIVE METABOLIC PANEL
ALT: 19 U/L (ref 0–44)
AST: 17 U/L (ref 15–41)
Albumin: 4.2 g/dL (ref 3.5–5.0)
Alkaline Phosphatase: 86 U/L (ref 38–126)
Anion gap: 11 (ref 5–15)
BUN: 38 mg/dL — ABNORMAL HIGH (ref 6–20)
CO2: 27 mmol/L (ref 22–32)
Calcium: 9.2 mg/dL (ref 8.9–10.3)
Chloride: 95 mmol/L — ABNORMAL LOW (ref 98–111)
Creatinine, Ser: 1.44 mg/dL — ABNORMAL HIGH (ref 0.61–1.24)
GFR calc Af Amer: >60 mL/min (ref >60)
GFR calc non Af Amer: 54 mL/min — ABNORMAL LOW (ref >60)
Glucose, Bld: 280 mg/dL — ABNORMAL HIGH (ref 70–99)
Potassium: 4.3 mmol/L (ref 3.5–5.1)
Sodium: 133 mmol/L — ABNORMAL LOW (ref 135–145)
Total Bilirubin: 0.9 mg/dL (ref 0.3–1.2)
Total Protein: 7.6 g/dL (ref 6.5–8.1)

## 2020-03-27 LAB — CBC WITH DIFFERENTIAL/PLATELET
Abs Immature Granulocytes: 0.01 10*3/uL (ref 0.00–0.07)
Basophils Absolute: 0 10*3/uL (ref 0.0–0.1)
Basophils Relative: 0 %
Eosinophils Absolute: 0 10*3/uL (ref 0.0–0.5)
Eosinophils Relative: 0 %
HCT: 46 % (ref 39.0–52.0)
Hemoglobin: 16 g/dL (ref 13.0–17.0)
Immature Granulocytes: 0 %
Lymphocytes Relative: 23 %
Lymphs Abs: 1.1 10*3/uL (ref 0.7–4.0)
MCH: 28.7 pg (ref 26.0–34.0)
MCHC: 34.8 g/dL (ref 30.0–36.0)
MCV: 82.4 fL (ref 80.0–100.0)
Monocytes Absolute: 0.3 10*3/uL (ref 0.1–1.0)
Monocytes Relative: 6 %
Neutro Abs: 3.4 10*3/uL (ref 1.7–7.7)
Neutrophils Relative %: 71 %
Platelets: 147 10*3/uL — ABNORMAL LOW (ref 150–400)
RBC: 5.58 MIL/uL (ref 4.22–5.81)
RDW: 12.7 % (ref 11.5–15.5)
WBC: 4.9 10*3/uL (ref 4.0–10.5)
nRBC: 0 % (ref 0.0–0.2)

## 2020-03-27 LAB — URINALYSIS, COMPLETE (UACMP) WITH MICROSCOPIC
Bacteria, UA: NONE SEEN
Bilirubin Urine: NEGATIVE
Glucose, UA: >=500 mg/dL — AB
Hgb urine dipstick: NEGATIVE
Ketones, ur: 20 mg/dL — AB
Leukocytes,Ua: NEGATIVE
Nitrite: NEGATIVE
Protein, ur: NEGATIVE mg/dL
Specific Gravity, Urine: 1.021 (ref 1.005–1.030)
Squamous Epithelial / HPF: NONE SEEN (ref 0–5)
pH: 5 (ref 5.0–8.0)

## 2020-03-27 LAB — RESPIRATORY PANEL BY RT PCR (FLU A&B, COVID)
Influenza A by PCR: NEGATIVE
Influenza B by PCR: NEGATIVE
SARS Coronavirus 2 by RT PCR: POSITIVE — AB

## 2020-03-27 LAB — LACTIC ACID, PLASMA: Lactic Acid, Venous: 1.3 mmol/L (ref 0.5–1.9)

## 2020-03-27 MED ORDER — ONDANSETRON 4 MG PO TBDP: 4.0000 mg | ORAL_TABLET | Freq: Three times a day (TID) | ORAL | 0 refills | Status: DC | PRN

## 2020-03-27 MED ORDER — SODIUM CHLORIDE 0.9 % IV BOLUS
1000.0000 mL | Freq: Once | INTRAVENOUS | Status: AC
  Administered 2020-03-27: 1000 mL via INTRAVENOUS

## 2020-03-27 MED ORDER — PREDNISONE 50 MG PO TABS: 50.0000 mg | ORAL_TABLET | Freq: Every day | ORAL | 0 refills | Status: DC

## 2020-03-27 MED ORDER — SODIUM CHLORIDE 0.9 % IV BOLUS
1000.0000 mL | Freq: Once | INTRAVENOUS | Status: AC
  Administered 2020-03-27: 20:00:00 1000 mL via INTRAVENOUS

## 2020-03-27 NOTE — ED Triage Notes (Signed)
Pt arrived via [facility] with reports of possible exposure to COVID. C/o cough, no taste, poor PO intake and dehydration.  PT c/o feeling weak as well.  Pt reports wife tested positive for COVID on 4/9 and is currently hospitalized.   PT has not been vaccinated.

## 2020-03-27 NOTE — ED Notes (Signed)
Fluids continue to infuse.

## 2020-03-27 NOTE — ED Notes (Signed)
Report  Given to [employer], Charity fundraiser

## 2020-03-27 NOTE — ED Notes (Signed)
Pt given urinal.

## 2020-03-27 NOTE — ED Provider Notes (Signed)
[facility] Emergency Department Provider Note  ____________________________________________  Time seen: Approximately 4:42 PM  I have reviewed the triage vital signs and the nursing notes.   HISTORY  Chief Complaint Cough and Weakness    HPI Jeremy Padilla is a 54 y.o. male who presents the emergency department complaining of weakness, fevers, body aches, cough.  Patient states that he overall is feeling "very poorly" over the past 2 days.  Patient's wife has tested positive for Covid, is currently admitted in the hospital for Covid symptoms.  Patient has not been immunized.  He states that he is experiencing similar symptoms as his wife.  Patient presented to the emergency department for evaluation after failing very weak, feeling "off."  Patient denies any headache, visual changes, neck pain or stiffness, chest pain, abdominal pain, nausea vomiting, diarrhea or constipation.  No medications prior to arrival.  Patient states that when he was being evaluated in triage, they attempted to draw blood, patient became very lightheaded and had a syncopal episode.  Patient states that typically blood draws in the side of blood do not cause any kind of reaction.  Patient states that other than increased generalized weakness after blood draw he has had no other changes to include headache or chest pain.         Past Medical History:  Diagnosis Date  . Abscess 10/05/15  . Diabetes mellitus without complication (HCC)   . 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 Hyperlipidemia   . Hypertension     Patient Active Problem List   Diagnosis Date Noted  . Ischemia of left lower extremity 05/30/2019  . Atherosclerosis of native arteries of extremity with rest pain (HCC) 04/08/2019  . Lacunar infarction (HCC) 08/03/2017  . Bruit of left carotid artery 08/03/2017  . Type 2 diabetes mellitus with complication, without long-term current use of insulin (HCC) 08/02/2017  . Abscess or  cellulitis, neck 10/08/2015  . Hyperglycemia 10/08/2015  . Hypertension goal BP (blood pressure) < 140/90 10/08/2015  . Heart murmur 10/08/2015    Past Surgical History:  Procedure Laterality Date  . LOWER EXTREMITY ANGIOGRAPHY Left 04/09/2019   Procedure: LOWER EXTREMITY ANGIOGRAPHY;  Surgeon: Annice Needy, MD;  Location: [facility];  Service: Cardiovascular;  Laterality: Left;  . LOWER EXTREMITY INTERVENTION N/A 05/30/2019   Procedure: LOWER EXTREMITY INTERVENTION;  Surgeon: Renford Dills, MD;  Location: [facility];  Service: Cardiovascular;  Laterality: N/A;  . WRIST SURGERY Left     Prior to Admission medications   Medication Sig Start Date End Date Taking? Authorizing Provider  amLODipine (NORVASC) 10 MG tablet Take 1 tablet (10 mg total) by mouth daily. 08/03/17   Antonieta Iba, MD  aspirin EC 81 MG tablet Take 81 mg by mouth daily.    [provider]  atorvastatin (LIPITOR) 40 MG tablet Take 1 tablet (40 mg total) by mouth daily. 08/03/17   Antonieta Iba, MD  clopidogrel (PLAVIX) 75 MG tablet Take 75 mg by mouth daily.    [provider]  glipiZIDE (GLUCOTROL) 10 MG tablet Take 10 mg by mouth daily before breakfast.    [provider]  hydrochlorothiazide (HYDRODIURIL) 25 MG tablet Take 25 mg by mouth daily.    [provider]  linagliptin (TRADJENTA) 5 MG TABS tablet Take 5 mg by mouth daily. 04/07/19   [provider]  lisinopril (ZESTRIL) 40 MG tablet Take 40 mg by mouth daily.    [provider]  ondansetron (ZOFRAN-ODT) 4 MG disintegrating tablet Take 1 tablet (4 mg total) by mouth every 8 (eight) hours as needed for nausea or vomiting. 03/27/20   Juliany Daughety, Charline Bills, PA-C  predniSONE (DELTASONE) 50 MG tablet Take 1 tablet (50 mg total) by mouth daily with breakfast. 03/27/20   Kyjuan Gause, Charline Bills, PA-C  traMADol (ULTRAM) 50 MG tablet Take 1 tablet (50 mg total) by mouth every 6 (six) hours as  needed for moderate pain or severe pain. 06/06/19   Kris Hartmann, NP    Allergies Penicillins  Family History  Problem Relation Age of Onset  . Heart disease Father   . Hyperlipidemia Father   . Hypertension Father     Social History Social History   Tobacco Use  . Smoking status: Never Smoker  . Smokeless tobacco: Never Used  Substance Use Topics  . Alcohol use: No  . Drug use: No     Review of Systems  Constitutional: Positive fever/chills.  Generalized malaise.  Body aches. Eyes: No visual changes. No discharge ENT: No upper respiratory complaints. Cardiovascular: no chest pain. Respiratory: Positive cough. No SOB. Gastrointestinal: No abdominal pain.  No nausea, no vomiting.  No diarrhea.  No constipation. Genitourinary: Negative for dysuria. No hematuria Musculoskeletal: Negative for musculoskeletal pain. Skin: Negative for rash, abrasions, lacerations, ecchymosis. Neurological: Negative for headaches, focal weakness or numbness. 10-point ROS otherwise negative.  ____________________________________________   PHYSICAL EXAM:  VITAL SIGNS: ED Triage Vitals  Enc Vitals Group     BP 03/27/20 1558 127/71     Pulse Rate 03/27/20 1558 (!) 104     Resp 03/27/20 1558 20     Temp 03/27/20 1558 98.9 F (37.2 C)     Temp Source 03/27/20 1558 Oral     SpO2 03/27/20 1558 97 %     Weight 03/27/20 1559 188 lb (85.3 kg)     Height 03/27/20 1559 6\' 2"  (1.88 m)     Head Circumference --      Peak Flow --      Pain Score 03/27/20 1558 0     Pain Loc --      Pain Edu? --      Excl. in [place]? --      Constitutional: Alert and oriented. Well appearing and in no acute distress. Eyes: Conjunctivae are normal. PERRL. EOMI. Head: Atraumatic. ENT:      Ears: EACs and TMs unremarkable bilaterally      Nose: No congestion/rhinnorhea.      Mouth/Throat: Mucous membranes are moist.  Oropharynx is nonerythematous and nonedematous.  Uvula is midline. Neck: No stridor.  Neck  is supple full range motion Hematological/Lymphatic/Immunilogical: No cervical lymphadenopathy. Cardiovascular: Normal rate, regular rhythm. Normal S1 and S2.  Good peripheral circulation. Respiratory: Normal respiratory effort without tachypnea or retractions. Lungs CTAB. Good air entry to the bases with no decreased or absent breath sounds. Gastrointestinal: Bowel sounds 4 quadrants. Soft and nontender to palpation. No guarding or rigidity. No palpable masses. No distention. No CVA tenderness. Musculoskeletal: Full range of motion to all extremities. No gross deformities appreciated. Neurologic:  Normal speech and language. No gross focal neurologic deficits are appreciated.  Skin:  Skin is warm, dry and intact. No rash noted. Psychiatric: Mood and affect are normal. Speech and behavior are normal. Patient exhibits appropriate insight and judgement.   ____________________________________________   LABS (all labs ordered are listed, but only abnormal results are displayed)  Labs Reviewed  RESPIRATORY PANEL BY RT PCR (FLU A&B, COVID) -  Abnormal; Notable for the following components:      Result Value   SARS Coronavirus 2 by RT PCR POSITIVE (*)    All other components within normal limits  COMPREHENSIVE METABOLIC PANEL - Abnormal; Notable for the following components:   Sodium 133 (*)    Chloride 95 (*)    Glucose, Bld 280 (*)    BUN 38 (*)    Creatinine, Ser 1.44 (*)    GFR calc non Af Amer 54 (*)    All other components within normal limits  CBC WITH DIFFERENTIAL/PLATELET - Abnormal; Notable for the following components:   Platelets 147 (*)    All other components within normal limits  LACTIC ACID, PLASMA  URINALYSIS, COMPLETE (UACMP) WITH MICROSCOPIC   ____________________________________________  EKG   ____________________________________________  RADIOLOGY I personally viewed and evaluated these images as part of my medical decision making, as well as reviewing the  written report by the radiologist.  DG Chest 1 View  Result Date: 03/27/2020 CLINICAL DATA:  Cough, fever. EXAM: CHEST  1 VIEW COMPARISON:  July 18, 2017. FINDINGS: The heart size and mediastinal contours are within normal limits. Both lungs are clear. The visualized skeletal structures are unremarkable. IMPRESSION: No active disease. Electronically Signed   By: Lupita Raider M.D.   On: 03/27/2020 17:08    ____________________________________________    PROCEDURES  Procedure(s) performed:    Procedures    Medications  sodium chloride 0.9 % bolus 1,000 mL (0 mLs Intravenous Stopped 03/27/20 1955)  sodium chloride 0.9 % bolus 1,000 mL (1,000 mLs Intravenous New Bag/Given 03/27/20 1955)     ____________________________________________   INITIAL IMPRESSION / ASSESSMENT AND PLAN / ED COURSE  Pertinent labs & imaging results that were available during my care of the patient were reviewed by me and considered in my medical decision making (see chart for details).  Review of the [place] CSRS was performed in accordance of the [facility] prior to dispensing any controlled drugs.           Patient's diagnosis is consistent with given 19.  Patient presented to the emergency department complaining of cough, weakness, malaise, body aches, fevers and chills in the presence of a known Covid contact.  Patient's wife was diagnosed with COVID-19, is currently admitted as an inpatient.  Patient states that he lost taste, as he was trying to eat and drink because he cannot taste anything he was gagging and not maintaining good oral hydration.  Patient stated that he felt like he was dehydrated.  Patient did have a syncopal episode with lab draw.  Otherwise, exam was reassuring.  Labs are reassuring.  Patient has had significant improvement with IV fluids.  Patient was given 2 L of IV fluid, will be discharged with prednisone, Zofran..  Return precautions are discussed with the patient.  Follow-up with  primary care.  Patient is given ED precautions to return to the ED for any worsening or new symptoms.     ____________________________________________  FINAL CLINICAL IMPRESSION(S) / ED DIAGNOSES  Final diagnoses:  COVID-19  Dehydration      NEW MEDICATIONS STARTED DURING THIS VISIT:  ED Discharge Orders         Ordered    predniSONE (DELTASONE) 50 MG tablet  Daily with breakfast     03/27/20 2029    ondansetron (ZOFRAN-ODT) 4 MG disintegrating tablet  Every 8 hours PRN     03/27/20 2029  This chart was dictated using voice recognition software/Dragon. Despite best efforts to proofread, errors can occur which can change the meaning. Any change was purely unintentional.    Lanette Hampshire 03/27/20 2035    Sharman Cheek, MD 03/27/20 2116

## 2020-03-27 NOTE — ED Notes (Signed)
Pt to ED via [facility], Pt wife tested pos for COVID, pt not feeling well, cannot eat because it taste weird. Pt is in NAD

## 2020-03-27 NOTE — ED Notes (Addendum)
After blood drawn by this RN, pt started complaining of feeling lightheaded, then had a syncopal period for about 30 seconds, pt was slumped over in the chair snoring. Pt awoken and was disoriented, VS re-evaluated 150/81 p-76 94%RA  PT taken to 12H initially.   After syncopal episode, pt did appear pale in color and a bit disoriented as well.     BLUE AND RED TOP TUBE SENT TO LAB

## 2020-03-28 ENCOUNTER — Telehealth: Payer: Self-pay | Admitting: Unknown Physician Specialty

## 2020-03-28 NOTE — Telephone Encounter (Signed)
Called to discuss with patient about Covid symptoms and the use of bamlanivimab, a monoclonal antibody infusion for those with mild to moderate Covid symptoms and at a high risk of hospitalization.  Pt is qualified for this infusion at the [facility] due to Age >55   Message left to call back  

## 2020-03-30 ENCOUNTER — Telehealth: Payer: Self-pay | Admitting: Adult Health

## 2020-03-30 NOTE — Telephone Encounter (Signed)
Called and left message on patient voice mail to please call us back so we can further review his eligibility for monoclonal antibody treatment for COVID 19 infection.    Lillard Anes, np

## 2020-04-12 IMAGING — US VENOUS DOPPLER ULTRASOUND OF LEFT LOWER EXTREMITY
1 series · 13 of 24 positions shown · non-contrast
Comparison: None.

CLINICAL DATA: 52-year-old male with left leg swelling for the past
couple weeks



[Series 1: venous doppler ultrasound of left lower extremity · 13 of 48 slices shown]
[im 1/48]
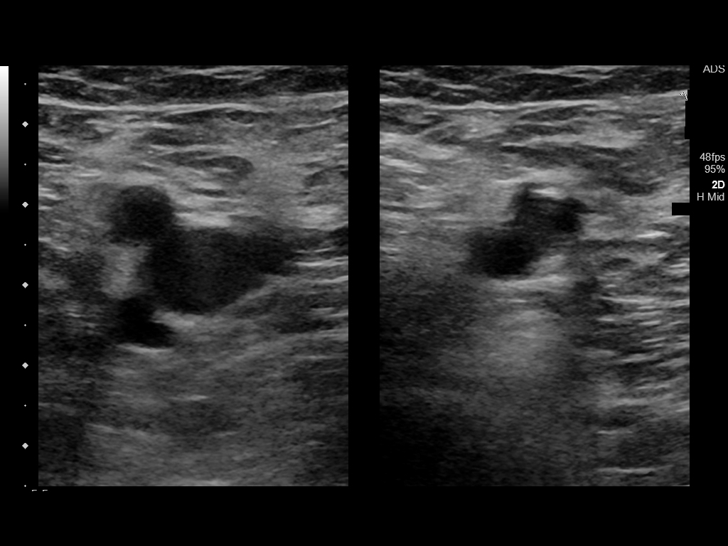
[im 5/48]
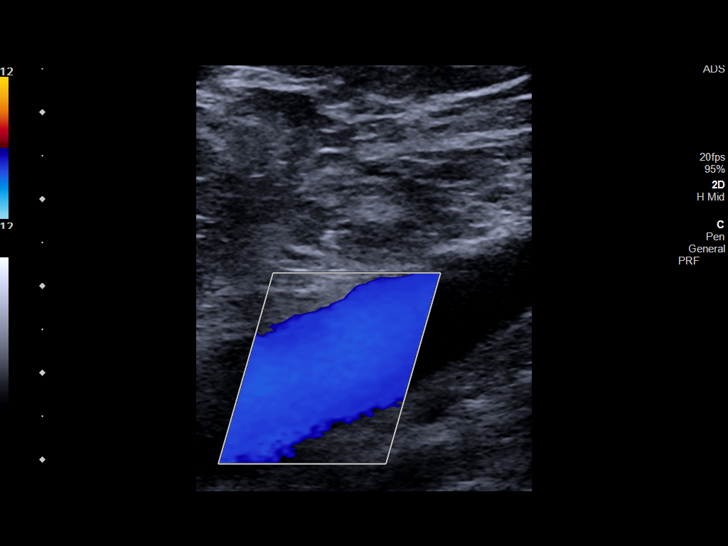
[im 9/48]
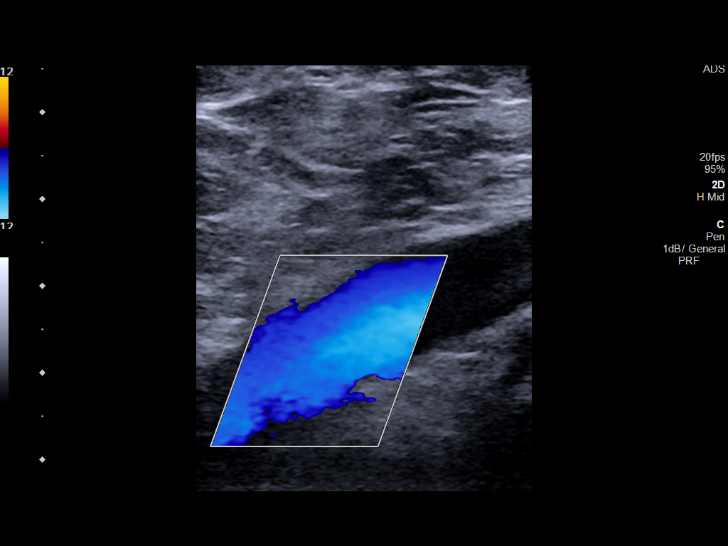
[im 13/48]
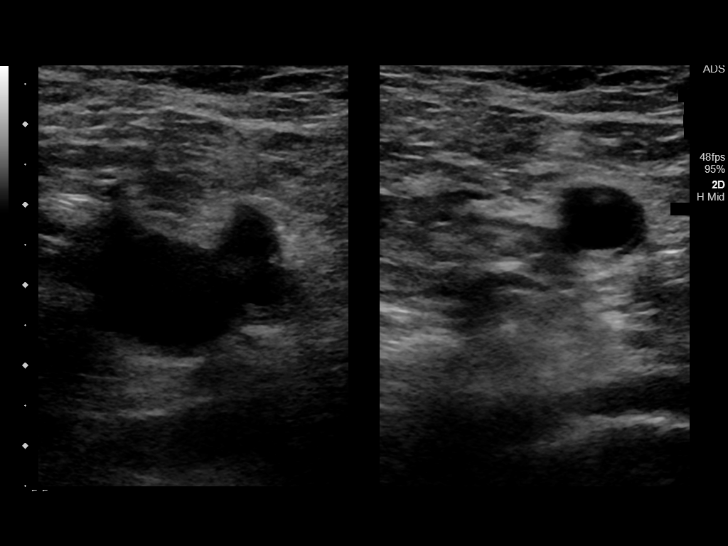
[im 17/48]
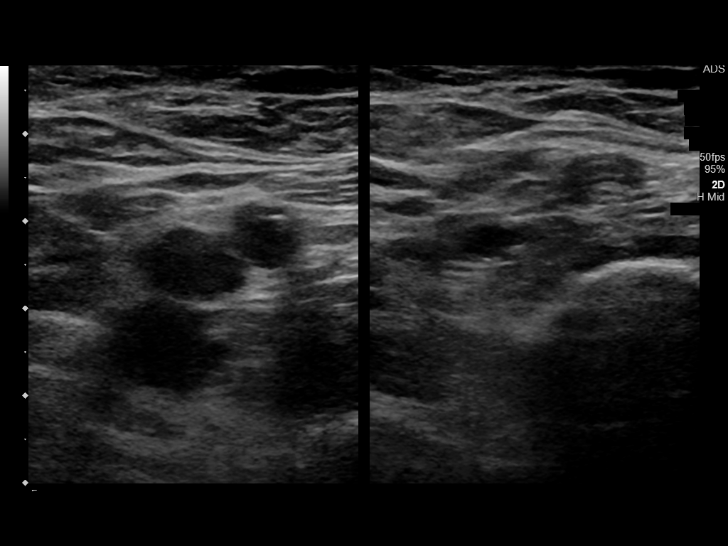
[im 21/48]
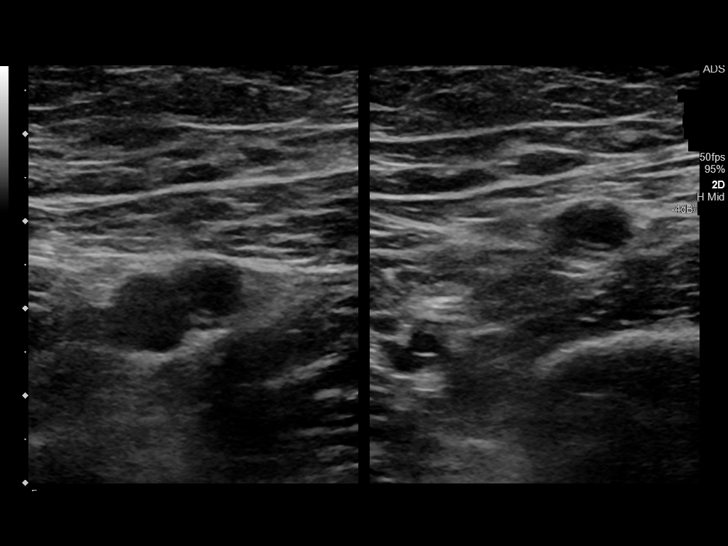
[im 25/48]
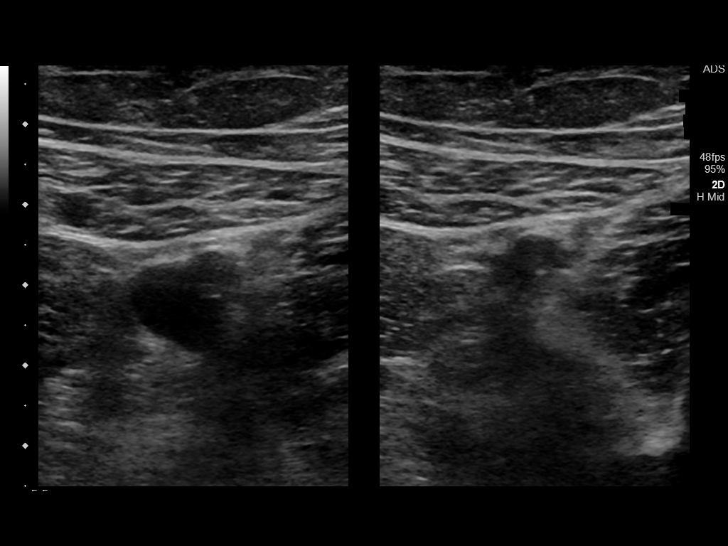
[im 27/48]
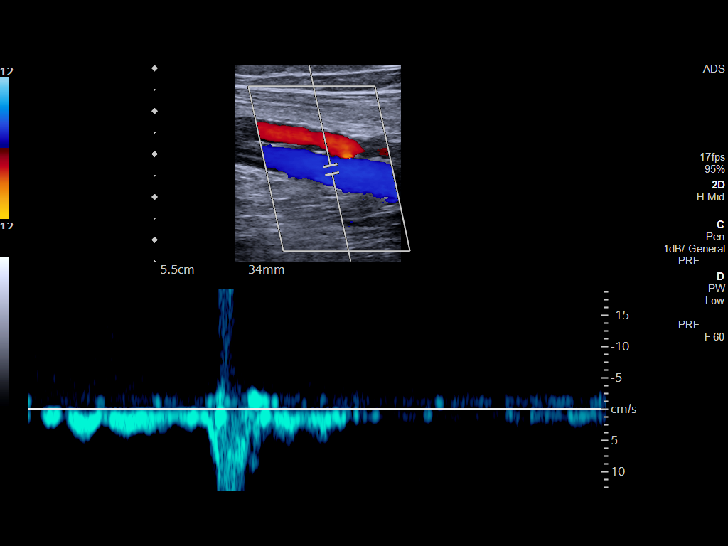
[im 31/48]
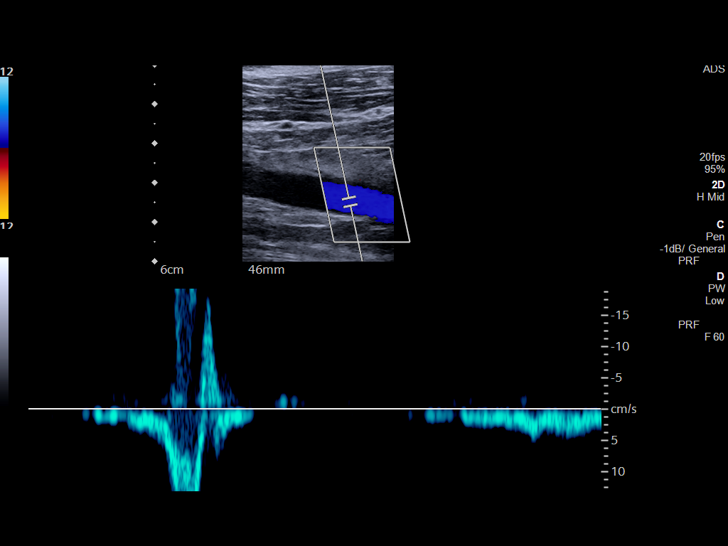
[im 35/48]
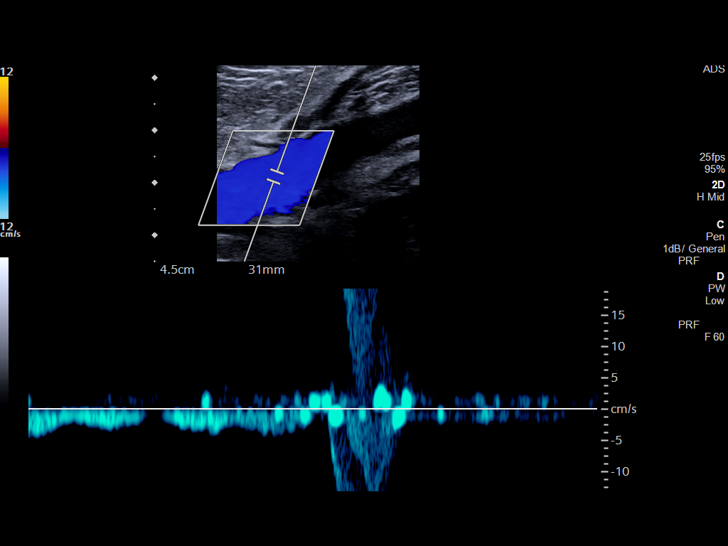
[im 39/48]
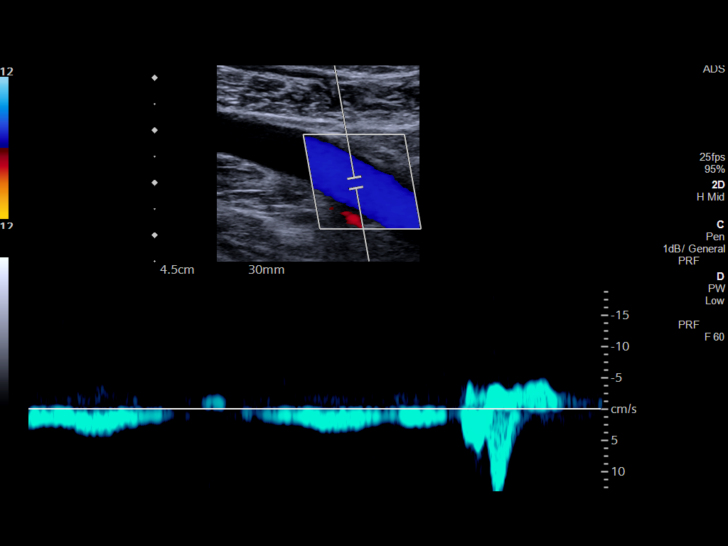
[im 43/48]
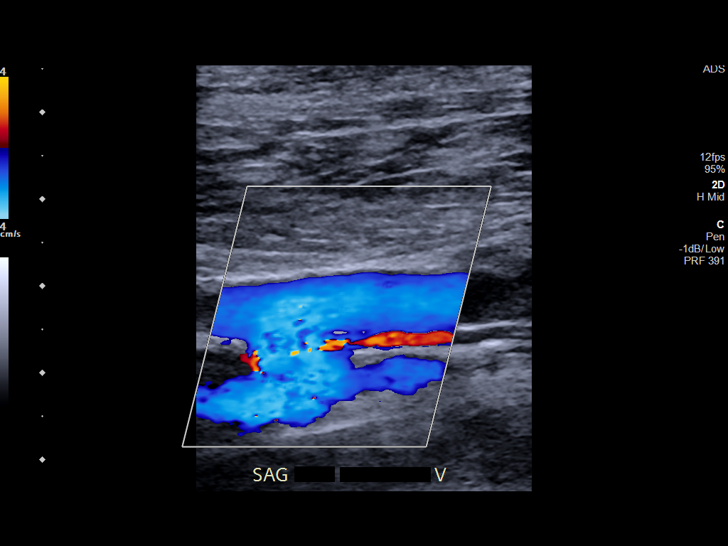
[im 48/48]
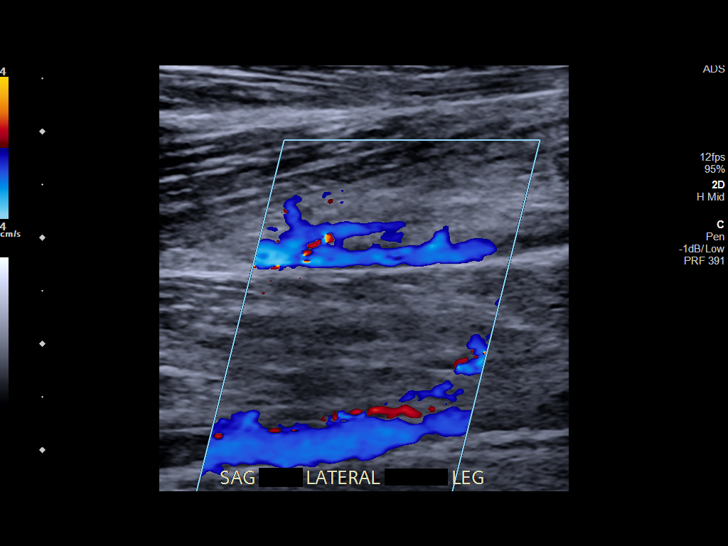

[13 of 24 positions shown; findings below may reference images not displayed]

FINDINGS: Contralateral Common Femoral Vein: Respiratory phasicity is normal
and symmetric with the symptomatic side. No evidence of thrombus.
Normal compressibility.

Common Femoral Vein: No evidence of thrombus. Normal
compressibility, respiratory phasicity and response to augmentation.

Saphenofemoral Junction: No evidence of thrombus. Normal
compressibility and flow on color Doppler imaging.

Profunda Femoral Vein: No evidence of thrombus. Normal
compressibility and flow on color Doppler imaging.

Femoral Vein: No evidence of thrombus. Normal compressibility,
respiratory phasicity and response to augmentation.

Popliteal Vein: No evidence of thrombus. Normal compressibility,
respiratory phasicity and response to augmentation.

Calf Veins: No evidence of thrombus. Normal compressibility and flow
on color Doppler imaging.

Superficial Great Saphenous Vein: No evidence of thrombus. Normal
compressibility.

Venous Reflux:  None.

Other Findings:  None.
IMPRESSION: No evidence of deep venous thrombosis.

## 2020-04-14 ENCOUNTER — Other Ambulatory Visit (INDEPENDENT_AMBULATORY_CARE_PROVIDER_SITE_OTHER): Payer: Self-pay | Admitting: Nurse Practitioner

## 2020-04-14 DIAGNOSIS — I739 Peripheral vascular disease, unspecified: Secondary | ICD-10-CM

## 2020-04-14 DIAGNOSIS — I70209 Unspecified atherosclerosis of native arteries of extremities, unspecified extremity: Secondary | ICD-10-CM

## 2020-04-16 ENCOUNTER — Other Ambulatory Visit (INDEPENDENT_AMBULATORY_CARE_PROVIDER_SITE_OTHER): Payer: Self-pay | Admitting: Vascular Surgery

## 2020-04-16 ENCOUNTER — Ambulatory Visit (INDEPENDENT_AMBULATORY_CARE_PROVIDER_SITE_OTHER): Payer: Medicaid Other

## 2020-04-16 ENCOUNTER — Other Ambulatory Visit: Payer: Self-pay

## 2020-04-16 ENCOUNTER — Ambulatory Visit (INDEPENDENT_AMBULATORY_CARE_PROVIDER_SITE_OTHER): Payer: Self-pay | Admitting: Nurse Practitioner

## 2020-04-16 VITALS — BP 201/99 | HR 108 | Ht 74.0 in | Wt 185.0 lb

## 2020-04-16 DIAGNOSIS — I70209 Unspecified atherosclerosis of native arteries of extremities, unspecified extremity: Secondary | ICD-10-CM

## 2020-04-16 DIAGNOSIS — I739 Peripheral vascular disease, unspecified: Secondary | ICD-10-CM | POA: Diagnosis not present

## 2020-04-16 DIAGNOSIS — I998 Other disorder of circulatory system: Secondary | ICD-10-CM | POA: Insufficient documentation

## 2020-04-16 DIAGNOSIS — E118 Type 2 diabetes mellitus with unspecified complications: Secondary | ICD-10-CM

## 2020-04-16 DIAGNOSIS — I10 Essential (primary) hypertension: Secondary | ICD-10-CM

## 2020-04-19 ENCOUNTER — Other Ambulatory Visit: Payer: Self-pay

## 2020-04-19 ENCOUNTER — Encounter: Payer: Self-pay | Admitting: Vascular Surgery

## 2020-04-19 ENCOUNTER — Encounter (INDEPENDENT_AMBULATORY_CARE_PROVIDER_SITE_OTHER): Payer: Self-pay | Admitting: Nurse Practitioner

## 2020-04-19 ENCOUNTER — Inpatient Hospital Stay
Admission: RE | Admit: 2020-04-19 | Discharge: 2020-04-21 | DRG: 272 | Disposition: A | Discharge status: Home / Self Care | Payer: Medicaid Other | Attending: Vascular Surgery | Admitting: Vascular Surgery

## 2020-04-19 ENCOUNTER — Encounter
Admission: RE | Disposition: A | Discharge status: Home / Self Care | Payer: Self-pay | Source: Home / Self Care | Attending: Vascular Surgery

## 2020-04-19 DIAGNOSIS — I10 Essential (primary) hypertension: Secondary | ICD-10-CM | POA: Diagnosis present

## 2020-04-19 DIAGNOSIS — Z7902 Long term (current) use of antithrombotics/antiplatelets: Secondary | ICD-10-CM

## 2020-04-19 DIAGNOSIS — Z20822 Contact with and (suspected) exposure to covid-19: Secondary | ICD-10-CM | POA: Diagnosis present

## 2020-04-19 DIAGNOSIS — I998 Other disorder of circulatory system: Principal | ICD-10-CM | POA: Diagnosis present

## 2020-04-19 DIAGNOSIS — D649 Anemia, unspecified: Secondary | ICD-10-CM | POA: Diagnosis present

## 2020-04-19 DIAGNOSIS — E785 Hyperlipidemia, unspecified: Secondary | ICD-10-CM | POA: Diagnosis present

## 2020-04-19 DIAGNOSIS — Z880 Allergy status to penicillin: Secondary | ICD-10-CM

## 2020-04-19 DIAGNOSIS — I70209 Unspecified atherosclerosis of native arteries of extremities, unspecified extremity: Secondary | ICD-10-CM | POA: Diagnosis present

## 2020-04-19 DIAGNOSIS — Z8249 Family history of ischemic heart disease and other diseases of the circulatory system: Secondary | ICD-10-CM

## 2020-04-19 DIAGNOSIS — I70221 Atherosclerosis of native arteries of extremities with rest pain, right leg: Secondary | ICD-10-CM | POA: Diagnosis not present

## 2020-04-19 DIAGNOSIS — Z8616 Personal history of COVID-19: Secondary | ICD-10-CM

## 2020-04-19 DIAGNOSIS — Z86718 Personal history of other venous thrombosis and embolism: Secondary | ICD-10-CM

## 2020-04-19 DIAGNOSIS — Z7982 Long term (current) use of aspirin: Secondary | ICD-10-CM

## 2020-04-19 DIAGNOSIS — E1151 Type 2 diabetes mellitus with diabetic peripheral angiopathy without gangrene: Secondary | ICD-10-CM | POA: Diagnosis present

## 2020-04-19 DIAGNOSIS — Z79899 Other long term (current) drug therapy: Secondary | ICD-10-CM

## 2020-04-19 HISTORY — PX: LOWER EXTREMITY ANGIOGRAPHY: CATH118251

## 2020-04-19 LAB — CBC
HCT: 35 % — ABNORMAL LOW (ref 39.0–52.0)
HCT: 35.4 % — ABNORMAL LOW (ref 39.0–52.0)
HCT: 38.1 % — ABNORMAL LOW (ref 39.0–52.0)
Hemoglobin: 12.4 g/dL — ABNORMAL LOW (ref 13.0–17.0)
Hemoglobin: 12.2 g/dL — ABNORMAL LOW (ref 13.0–17.0)
Hemoglobin: 12.9 g/dL — ABNORMAL LOW (ref 13.0–17.0)
MCH: 28.6 pg (ref 26.0–34.0)
MCH: 28.7 pg (ref 26.0–34.0)
MCH: 28.4 pg (ref 26.0–34.0)
MCHC: 35.4 g/dL (ref 30.0–36.0)
MCHC: 34.5 g/dL (ref 30.0–36.0)
MCHC: 33.9 g/dL (ref 30.0–36.0)
MCV: 80.8 fL (ref 80.0–100.0)
MCV: 83.3 fL (ref 80.0–100.0)
MCV: 83.7 fL (ref 80.0–100.0)
Platelets: 217 10*3/uL (ref 150–400)
Platelets: 215 10*3/uL (ref 150–400)
Platelets: 233 10*3/uL (ref 150–400)
RBC: 4.33 MIL/uL (ref 4.22–5.81)
RBC: 4.25 MIL/uL (ref 4.22–5.81)
RBC: 4.55 MIL/uL (ref 4.22–5.81)
RDW: 12.4 % (ref 11.5–15.5)
RDW: 12.4 % (ref 11.5–15.5)
RDW: 12.7 % (ref 11.5–15.5)
WBC: 9.5 10*3/uL (ref 4.0–10.5)
WBC: 9.2 10*3/uL (ref 4.0–10.5)
WBC: 10.2 10*3/uL (ref 4.0–10.5)
nRBC: 0 % (ref 0.0–0.2)
nRBC: 0 % (ref 0.0–0.2)
nRBC: 0 % (ref 0.0–0.2)

## 2020-04-19 LAB — BASIC METABOLIC PANEL
Anion gap: 10 (ref 5–15)
BUN: 27 mg/dL — ABNORMAL HIGH (ref 6–20)
CO2: 23 mmol/L (ref 22–32)
Calcium: 8.7 mg/dL — ABNORMAL LOW (ref 8.9–10.3)
Chloride: 100 mmol/L (ref 98–111)
Creatinine, Ser: 0.99 mg/dL (ref 0.61–1.24)
GFR calc Af Amer: >60 mL/min (ref >60)
GFR calc non Af Amer: >60 mL/min (ref >60)
Glucose, Bld: 262 mg/dL — ABNORMAL HIGH (ref 70–99)
Potassium: 4.1 mmol/L (ref 3.5–5.1)
Sodium: 133 mmol/L — ABNORMAL LOW (ref 135–145)

## 2020-04-19 LAB — MRSA PCR SCREENING: MRSA by PCR: NEGATIVE

## 2020-04-19 LAB — FIBRINOGEN
Fibrinogen: 457 mg/dL (ref 210–475)
Fibrinogen: 421 mg/dL (ref 210–475)
Fibrinogen: 457 mg/dL (ref 210–475)

## 2020-04-19 LAB — BUN: BUN: 31 mg/dL — ABNORMAL HIGH (ref 6–20)

## 2020-04-19 LAB — PHOSPHORUS: Phosphorus: 3.3 mg/dL (ref 2.5–4.6)

## 2020-04-19 LAB — CREATININE, SERUM
Creatinine, Ser: 1.15 mg/dL (ref 0.61–1.24)
GFR calc Af Amer: >60 mL/min (ref >60)
GFR calc non Af Amer: >60 mL/min (ref >60)

## 2020-04-19 LAB — GLUCOSE, CAPILLARY
Glucose-Capillary: 275 mg/dL — ABNORMAL HIGH (ref 70–99)
Glucose-Capillary: 223 mg/dL — ABNORMAL HIGH (ref 70–99)
Glucose-Capillary: 202 mg/dL — ABNORMAL HIGH (ref 70–99)

## 2020-04-19 LAB — MAGNESIUM: Magnesium: 1.7 mg/dL (ref 1.7–2.4)

## 2020-04-19 LAB — TROPONIN I (HIGH SENSITIVITY): Troponin I (High Sensitivity): 12 ng/L (ref <18)

## 2020-04-19 LAB — HEPARIN LEVEL (UNFRACTIONATED)
Heparin Unfractionated: >3.6 IU/mL — ABNORMAL HIGH (ref 0.30–0.70)
Heparin Unfractionated: >3.6 IU/mL — ABNORMAL HIGH (ref 0.30–0.70)

## 2020-04-19 SURGERY — LOWER EXTREMITY ANGIOGRAPHY
Anesthesia: Moderate Sedation | Laterality: Left

## 2020-04-19 MED ORDER — ONDANSETRON HCL 4 MG/2ML IJ SOLN: 4.0000 mg | Freq: Four times a day (QID) | INTRAMUSCULAR | Status: DC | PRN

## 2020-04-19 MED ORDER — HYDROMORPHONE HCL 1 MG/ML IJ SOLN
1.0000 mg | INTRAMUSCULAR | Status: DC | PRN
  Administered 2020-04-19: 1 mg via INTRAVENOUS
  Filled 2020-04-19 (×2): qty 1

## 2020-04-19 MED ORDER — ALTEPLASE 2 MG IJ SOLR
INTRAMUSCULAR | Status: DC | PRN
  Administered 2020-04-19: 6 mg

## 2020-04-19 MED ORDER — HEPARIN (PORCINE) 25000 UT/250ML-% IV SOLN: 400.0000 [IU]/h | INTRAVENOUS | Status: DC

## 2020-04-19 MED ORDER — INSULIN ASPART 100 UNIT/ML ~~LOC~~ SOLN
0.0000 [IU] | Freq: Three times a day (TID) | SUBCUTANEOUS | Status: DC
  Administered 2020-04-19: 8 [IU] via SUBCUTANEOUS
  Administered 2020-04-19 – 2020-04-21 (×5): 5 [IU] via SUBCUTANEOUS
  Filled 2020-04-19 (×4): qty 1

## 2020-04-19 MED ORDER — KETOROLAC TROMETHAMINE 30 MG/ML IJ SOLN
30.0000 mg | Freq: Four times a day (QID) | INTRAMUSCULAR | Status: AC
  Administered 2020-04-19 – 2020-04-20 (×2): 30 mg via INTRAVENOUS
  Filled 2020-04-19 (×2): qty 1

## 2020-04-19 MED ORDER — HYDROMORPHONE HCL 1 MG/ML IJ SOLN
INTRAMUSCULAR | Status: AC
  Administered 2020-04-19: 15:00:00 1 mg via INTRAVENOUS
  Filled 2020-04-19: qty 1

## 2020-04-19 MED ORDER — SODIUM CHLORIDE 0.9 % IV SOLN
250.0000 mL | INTRAVENOUS | Status: DC
  Administered 2020-04-19: 250 mL via INTRAVENOUS

## 2020-04-19 MED ORDER — SODIUM CHLORIDE 1 G PO TABS
2.0000 g | ORAL_TABLET | Freq: Once | ORAL | Status: AC
  Administered 2020-04-19: 23:00:00 2 g via ORAL
  Filled 2020-04-19: qty 2

## 2020-04-19 MED ORDER — SODIUM CHLORIDE 0.9% FLUSH
3.0000 mL | Freq: Two times a day (BID) | INTRAVENOUS | Status: DC
  Administered 2020-04-19 – 2020-04-21 (×3): 3 mL via INTRAVENOUS

## 2020-04-19 MED ORDER — INSULIN ASPART 100 UNIT/ML ~~LOC~~ SOLN
0.0000 [IU] | Freq: Every day | SUBCUTANEOUS | Status: DC
  Administered 2020-04-19: 22:00:00 2 [IU] via SUBCUTANEOUS
  Filled 2020-04-19: qty 1

## 2020-04-19 MED ORDER — MAGNESIUM SULFATE 2 GM/50ML IV SOLN
2.0000 g | Freq: Once | INTRAVENOUS | Status: AC
  Administered 2020-04-19: 22:00:00 2 g via INTRAVENOUS
  Filled 2020-04-19: qty 50

## 2020-04-19 MED ORDER — LABETALOL HCL 5 MG/ML IV SOLN
INTRAVENOUS | Status: DC | PRN
  Administered 2020-04-19: 10 mg via INTRAVENOUS

## 2020-04-19 MED ORDER — HEPARIN SODIUM (PORCINE) 1000 UNIT/ML IJ SOLN
INTRAMUSCULAR | Status: DC | PRN
  Administered 2020-04-19: 5000 [IU] via INTRAVENOUS

## 2020-04-19 MED ORDER — MIDAZOLAM HCL 2 MG/2ML IJ SOLN: 1.0000 mg | INTRAMUSCULAR | Status: DC | PRN

## 2020-04-19 MED ORDER — CLINDAMYCIN PHOSPHATE 300 MG/50ML IV SOLN
INTRAVENOUS | Status: AC
  Filled 2020-04-19: qty 50

## 2020-04-19 MED ORDER — FAMOTIDINE 20 MG PO TABS: 40.0000 mg | ORAL_TABLET | Freq: Once | ORAL | Status: DC | PRN

## 2020-04-19 MED ORDER — SODIUM CHLORIDE 0.9 % IV SOLN: INTRAVENOUS | Status: DC

## 2020-04-19 MED ORDER — MIDAZOLAM HCL 5 MG/5ML IJ SOLN
INTRAMUSCULAR | Status: AC
  Filled 2020-04-19: qty 5

## 2020-04-19 MED ORDER — ONDANSETRON HCL 4 MG/2ML IJ SOLN
4.0000 mg | Freq: Four times a day (QID) | INTRAMUSCULAR | Status: DC | PRN
  Filled 2020-04-19: qty 2

## 2020-04-19 MED ORDER — ATORVASTATIN CALCIUM 20 MG PO TABS
40.0000 mg | ORAL_TABLET | Freq: Every day | ORAL | Status: DC
  Administered 2020-04-19 – 2020-04-20 (×2): 40 mg via ORAL
  Filled 2020-04-19 (×2): qty 2

## 2020-04-19 MED ORDER — OXYCODONE HCL 5 MG PO TABS: 5.0000 mg | ORAL_TABLET | ORAL | Status: DC | PRN

## 2020-04-19 MED ORDER — FENTANYL CITRATE (PF) 100 MCG/2ML IJ SOLN
INTRAMUSCULAR | Status: DC | PRN
  Administered 2020-04-19 (×3): 25 ug via INTRAVENOUS
  Administered 2020-04-19: 12.5 ug via INTRAVENOUS
  Administered 2020-04-19: 50 ug via INTRAVENOUS
  Administered 2020-04-19: 12.5 ug via INTRAVENOUS
  Administered 2020-04-19: 25 ug via INTRAVENOUS

## 2020-04-19 MED ORDER — SODIUM CHLORIDE 0.9 % IV SOLN: 250.0000 mL | INTRAVENOUS | Status: DC | PRN

## 2020-04-19 MED ORDER — FENTANYL CITRATE (PF) 100 MCG/2ML IJ SOLN
INTRAMUSCULAR | Status: AC
  Filled 2020-04-19: qty 2

## 2020-04-19 MED ORDER — SODIUM CHLORIDE 0.9 % IV SOLN
0.5000 mg/h | INTRAVENOUS | Status: DC
  Administered 2020-04-20: 0.5 mg/h
  Filled 2020-04-19 (×3): qty 10

## 2020-04-19 MED ORDER — SODIUM CHLORIDE 0.9% FLUSH: 3.0000 mL | INTRAVENOUS | Status: DC | PRN

## 2020-04-19 MED ORDER — MIDAZOLAM HCL 2 MG/ML PO SYRP: 8.0000 mg | ORAL_SOLUTION | Freq: Once | ORAL | Status: DC | PRN

## 2020-04-19 MED ORDER — ALTEPLASE 2 MG IJ SOLR
INTRAMUSCULAR | Status: AC
  Filled 2020-04-19: qty 6

## 2020-04-19 MED ORDER — METHYLPREDNISOLONE SODIUM SUCC 125 MG IJ SOLR: 125.0000 mg | Freq: Once | INTRAMUSCULAR | Status: DC | PRN

## 2020-04-19 MED ORDER — LABETALOL HCL 5 MG/ML IV SOLN
INTRAVENOUS | Status: DC | PRN
  Administered 2020-04-19: 20 mg via INTRAVENOUS

## 2020-04-19 MED ORDER — MORPHINE SULFATE (PF) 4 MG/ML IV SOLN
5.0000 mg | INTRAVENOUS | Status: DC | PRN
  Administered 2020-04-19: 15:00:00 5 mg via INTRAVENOUS
  Filled 2020-04-19: qty 2

## 2020-04-19 MED ORDER — DIPHENHYDRAMINE HCL 50 MG/ML IJ SOLN: 50.0000 mg | Freq: Once | INTRAMUSCULAR | Status: DC | PRN

## 2020-04-19 MED ORDER — CLINDAMYCIN PHOSPHATE 300 MG/50ML IV SOLN
300.0000 mg | Freq: Once | INTRAVENOUS | Status: AC
  Administered 2020-04-19: 10:00:00 300 mg via INTRAVENOUS

## 2020-04-19 MED ORDER — HYDROCHLOROTHIAZIDE 25 MG PO TABS
25.0000 mg | ORAL_TABLET | Freq: Every day | ORAL | Status: DC
  Administered 2020-04-20 – 2020-04-21 (×2): 25 mg via ORAL
  Filled 2020-04-19 (×2): qty 1

## 2020-04-19 MED ORDER — HEPARIN (PORCINE) 25000 UT/250ML-% IV SOLN
INTRAVENOUS | Status: AC
  Administered 2020-04-19: 600 [IU]/h via INTRAVENOUS
  Filled 2020-04-19: qty 250

## 2020-04-19 MED ORDER — HYDROMORPHONE HCL 1 MG/ML IJ SOLN
INTRAMUSCULAR | Status: AC
  Administered 2020-04-19: 12:00:00 1 mg via INTRAVENOUS
  Filled 2020-04-19: qty 1

## 2020-04-19 MED ORDER — HYDROMORPHONE HCL 1 MG/ML IJ SOLN
INTRAMUSCULAR | Status: AC
  Filled 2020-04-19: qty 1

## 2020-04-19 MED ORDER — LABETALOL HCL 5 MG/ML IV SOLN
INTRAVENOUS | Status: AC
  Filled 2020-04-19: qty 4

## 2020-04-19 MED ORDER — HEPARIN SODIUM (PORCINE) 1000 UNIT/ML IJ SOLN
INTRAMUSCULAR | Status: AC
  Filled 2020-04-19: qty 1

## 2020-04-19 MED ORDER — HYDROMORPHONE HCL 1 MG/ML IJ SOLN: 1.0000 mg | Freq: Once | INTRAMUSCULAR | Status: DC | PRN

## 2020-04-19 MED ORDER — AMLODIPINE BESYLATE 10 MG PO TABS
10.0000 mg | ORAL_TABLET | Freq: Every day | ORAL | Status: DC
  Administered 2020-04-20 – 2020-04-21 (×2): 10 mg via ORAL
  Filled 2020-04-19 (×2): qty 1

## 2020-04-19 MED ORDER — LISINOPRIL 20 MG PO TABS
40.0000 mg | ORAL_TABLET | Freq: Every day | ORAL | Status: DC
  Administered 2020-04-20 – 2020-04-21 (×2): 40 mg via ORAL
  Filled 2020-04-19: qty 2
  Filled 2020-04-19: qty 4
  Filled 2020-04-19: qty 2

## 2020-04-19 MED ORDER — HYDROMORPHONE HCL 1 MG/ML IJ SOLN
0.5000 mg | Freq: Once | INTRAMUSCULAR | Status: AC
  Administered 2020-04-19: 0.5 mg via INTRAVENOUS

## 2020-04-19 MED ORDER — ACETAMINOPHEN 325 MG PO TABS: 650.0000 mg | ORAL_TABLET | Freq: Four times a day (QID) | ORAL | Status: DC | PRN

## 2020-04-19 MED ORDER — SODIUM CHLORIDE 0.9 % IV SOLN
1.0000 mg/h | INTRAVENOUS | Status: DC
  Administered 2020-04-19: 12:00:00 1 mg/h
  Filled 2020-04-19: qty 10

## 2020-04-19 MED ORDER — MIDAZOLAM HCL 2 MG/2ML IJ SOLN
INTRAMUSCULAR | Status: DC | PRN
  Administered 2020-04-19 (×4): 1 mg via INTRAVENOUS
  Administered 2020-04-19: 2 mg via INTRAVENOUS
  Administered 2020-04-19 (×2): 1 mg via INTRAVENOUS

## 2020-04-19 MED ORDER — ACETAMINOPHEN 500 MG PO TABS
1000.0000 mg | ORAL_TABLET | Freq: Four times a day (QID) | ORAL | Status: AC
  Administered 2020-04-19 – 2020-04-20 (×2): 1000 mg via ORAL
  Filled 2020-04-19 (×2): qty 2

## 2020-04-19 MED ORDER — CHLORHEXIDINE GLUCONATE CLOTH 2 % EX PADS
6.0000 | MEDICATED_PAD | Freq: Every day | CUTANEOUS | Status: DC
  Administered 2020-04-19 – 2020-04-20 (×2): 6 via TOPICAL

## 2020-04-19 SURGICAL SUPPLY — 22 items
BALLN LUTONIX 018 5X300X130 (BALLOONS) ×3
BALLN ULTRVRSE 3X300X150 (BALLOONS) ×2
BALLN ULTRVRSE 3X300X150 OTW (BALLOONS) ×1
BALLOON LUTONIX 018 5X300X130 (BALLOONS) IMPLANT
BALLOON ULTRVRSE 3X300X150 OTW (BALLOONS) IMPLANT
CANISTER PENUMBRA ENGINE (MISCELLANEOUS) ×2 IMPLANT
CATH BEACON 5 .038 100 VERT TP (CATHETERS) ×2 IMPLANT
CATH INFUS 135CMX50CM (CATHETERS) ×2 IMPLANT
CATH LIGHTNING 7 XTORQ 130 (CATHETERS) ×2 IMPLANT
CATH PIG 70CM (CATHETERS) ×2 IMPLANT
DEVICE PRESTO INFLATION (MISCELLANEOUS) ×2 IMPLANT
GLIDEWIRE ADV .035X260CM (WIRE) ×2 IMPLANT
GUIDEWIRE PFTE-COATED .018X300 (WIRE) ×2 IMPLANT
KIT CV MULTILUMEN 7FR 20 (SET/KITS/TRAYS/PACK) ×3
KIT CV MULTILUMEN 7FR 20 SUB (SET/KITS/TRAYS/PACK) IMPLANT
PACK ANGIOGRAPHY (CUSTOM PROCEDURE TRAY) ×2 IMPLANT
SHEATH BRITE TIP 5FRX11 (SHEATH) ×2 IMPLANT
SHEATH RAABE 7FR (SHEATH) ×2 IMPLANT
SYR MEDRAD MARK 7 150ML (SYRINGE) ×2 IMPLANT
TUBING CONTRAST HIGH PRESS 72 (TUBING) ×2 IMPLANT
WIRE G V18X300CM (WIRE) ×2 IMPLANT
WIRE J 3MM .035X145CM (WIRE) ×2 IMPLANT

## 2020-04-19 NOTE — Progress Notes (Signed)
Pt. States his leg is "sore" now: states It's a 6 now on a scale 1-10. "I can take the soreness better than that throbbing that was going on."

## 2020-04-19 NOTE — Progress Notes (Signed)
ANTICOAGULATION CONSULT NOTE - Initial Consult  Pharmacy Consult for Heparin Indication: VTE treatment; ischemia of LLE  Allergies  Allergen Reactions  . Penicillins Rash    .Has patient had a PCN reaction causing immediate rash, facial/tongue/throat swelling, SOB or lightheadedness with hypotension: Unknown Has patient had a PCN reaction causing severe rash involving mucus membranes or skin necrosis: Unknown Has patient had a PCN reaction that required hospitalization: Unknown Has patient had a PCN reaction occurring within the last 10 years: Unknown If all of the above answers are "NO", then may proceed with Cephalosporin use.     Patient Measurements: Height: 6\' 2"  (188 cm) Weight: 83.9 kg (184 lb 15.5 oz) IBW/kg (Calculated) : 82.2 Heparin Dosing Weight: 83.9 kg  Vital Signs: Temp: 98 F (36.7 C) (05/10 0849) Temp Source: Oral (05/10 0849) BP: 140/74 (05/10 1130) Pulse Rate: 94 (05/10 1130)  Labs: Recent Labs    04/19/20 0853  CREATININE 1.15    Estimated Creatinine Clearance: 84.4 mL/min (by C-G formula based on SCr of 1.15 mg/dL).   Medical History: Past Medical History:  Diagnosis Date  . Abscess 10/05/15  . Diabetes mellitus without complication (HCC)   . 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 Hyperlipidemia   . Hypertension       Assessment: 54 yo male s/p mechanical thrombectomy and angioplasty to start on heparin drip per MD goal of heparin level 0.2 - 0.5 units/mL.  Goal of Therapy:  Maintain heparin level 0.2 - 0.5 units/mL Monitor platelets by anticoagulation protocol: Yes   Plan:  Continue MD order for heparin 600 units/hr CBC and HL every 6 hours x4 post-procedure  Pharmacy will continue to follow.   53 L 04/19/2020,11:53 AM

## 2020-04-19 NOTE — H&P (Signed)
[facility] History & Physical Update  The patient was interviewed and re-examined.  The patient's previous History and Physical has been reviewed and is unchanged.  There is no change in the plan of care. We plan to proceed with the scheduled procedure.  Festus Barren, MD  04/19/2020, 9:52 AM

## 2020-04-19 NOTE — Progress Notes (Signed)
Pt. Med. With Dilaudid 1 mg IV now x 2 for c/o severe leg pain "10" on scale 1-10.

## 2020-04-19 NOTE — Progress Notes (Signed)
Dr. Wyn Quaker at bedside : spoke with pt. In person. Pt. Verbalizes understanding of conversation. MD assessed right groin site : no active drainage at present.

## 2020-04-19 NOTE — Progress Notes (Signed)
Subjective:    Patient ID: Jeremy Padilla, male    DOB: 1966/11/25, 54 y.o.   MRN: [id number] Chief Complaint  Patient presents with  . Follow-up    U/S follow up    The patient presents today for routine follow-up however he notes that his left lower extremity has been having some severe pain.  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  Unfortunately, the patient lost his wife recently and noted that with all the walking and moving about recently he started to have severe pain in his left lower extremity.  He notes that he can barely walk without severe pain.  He also endorses having some early rest pain symptoms.  He denies any ulcerations.  He states that his great toe is numb however the rest of the foot has feeling.  He denies any severe coldness in his leg he denies any TIA or amaurosis-like symptoms.  Today the patient underwent ABIs.  The right ABI is 0.58 and the left is 0.09.  The patient has monophasic waveforms in the right tibial arteries with strong toe waveforms.  The left lower extremity is occluded from the proximal SFA down to the tibial arteries.  The previously placed stents are completely occluded.   Review of Systems  Cardiovascular:       Rest pain and claudication  All other systems reviewed and are negative.      Objective:   Physical Exam Vitals reviewed.  Constitutional:      Appearance: Normal appearance.  HENT:     Head: Normocephalic.  Cardiovascular:     Rate and Rhythm: Normal rate and regular rhythm.     Pulses:          Dorsalis pedis pulses are detected w/ Doppler on the left side.       Posterior tibial pulses are 0 on the left side.  Neurological:     Mental Status: He is alert and oriented to person, place, and time.  Psychiatric:        Mood and Affect: Mood normal.        Behavior: Behavior normal.        Thought Content: Thought content normal.        Judgment: Judgment normal.     BP  (!) 201/99   Pulse (!) 108   Ht 6\' 2"  (1.88 m)   Wt 185 lb (83.9 kg)   BMI 23.75 kg/m   Past Medical History:  Diagnosis Date  . Abscess 10/05/15  . Diabetes mellitus without complication (HCC)   . 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 Hyperlipidemia   . Hypertension     Social History   Socioeconomic History  . Marital status: Widowed    Spouse name: Not on file  . Number of children: Not on file  . Years of education: Not on file  . Highest education level: Not on file  Occupational History  . Occupation: applied for disability  Tobacco Use  . Smoking status: Never Smoker  . Smokeless tobacco: Never Used  Substance and Sexual Activity  . Alcohol use: No  . Drug use: No  . Sexual activity: Not on file  Other Topics Concern  . Not on file  Social History Narrative   Wife just passed away Apr 21, 2009   Social Determinants of Health   Financial Resource Strain:   . Difficulty of Paying Living Expenses:   Food Insecurity:   . Worried About Running  Out of Food in the Last Year:   . Ran Out of Food in the Last Year:   Transportation Needs:   . Lack of Transportation (Medical):   Marland Kitchen Lack of Transportation (Non-Medical):   Physical Activity:   . Days of Exercise per Week:   . Minutes of Exercise per Session:   Stress:   . Feeling of Stress :   Social Connections:   . Frequency of Communication with Friends and Family:   . Frequency of Social Gatherings with Friends and Family:   . Attends Religious Services:   . Active Member of Clubs or Organizations:   . Attends Banker Meetings:   Marland Kitchen Marital Status:   Intimate Partner Violence:   . Fear of Current or Ex-Partner:   . Emotionally Abused:   Marland Kitchen Physically Abused:   . Sexually Abused:     Past Surgical History:  Procedure Laterality Date  . LOWER EXTREMITY ANGIOGRAPHY Left 04/09/2019   Procedure: LOWER EXTREMITY ANGIOGRAPHY;  Surgeon: Annice Needy, MD;  Location: [facility] INVASIVE CV LAB;  Service: Cardiovascular;   Laterality: Left;  . LOWER EXTREMITY INTERVENTION N/A 05/30/2019   Procedure: LOWER EXTREMITY INTERVENTION;  Surgeon: Renford Dills, MD;  Location: [facility] INVASIVE CV LAB;  Service: Cardiovascular;  Laterality: N/A;  . WRIST SURGERY Left     Family History  Problem Relation Age of Onset  . Heart disease Father   . Hyperlipidemia Father   . Hypertension Father     Allergies  Allergen Reactions  . Penicillins Rash    .Has patient had a PCN reaction causing immediate rash, facial/tongue/throat swelling, SOB or lightheadedness with hypotension: Unknown Has patient had a PCN reaction causing severe rash involving mucus membranes or skin necrosis: Unknown Has patient had a PCN reaction that required hospitalization: Unknown Has patient had a PCN reaction occurring within the last 10 years: Unknown If all of the above answers are "NO", then may proceed with Cephalosporin use.        Assessment & Plan:   1. Ischemia of left lower extremity Recommend:  The patient has evidence of severe atherosclerotic changes of both lower extremities with rest pain that is associated with preulcerative changes and impending tissue loss of the foot.  This represents a limb threatening ischemia and places the patient at the risk for limb loss.  Patient should undergo angiography of the lower extremities with the hope for intervention for limb salvage.  The risks and benefits as well as the alternative therapies was discussed in detail with the patient.  All questions were answered.  Patient agrees to proceed with angiography.  We will place the patient on Eliquis prior to his intervention  The patient will follow up with me in the office after the procedure.       2. Type 2 diabetes mellitus with complication, without long-term current use of insulin (HCC) Continue hypoglycemic medications as already ordered, these medications have been reviewed and there are no changes at this time.  Hgb A1C to  be monitored as already arranged by primary service   3. Hypertension goal BP (blood pressure) < 140/90 Continue antihypertensive medications as already ordered, these medications have been reviewed and there are no changes at this time.    No current facility-administered medications on file prior to visit.   Current Outpatient Medications on File Prior to Visit  Medication Sig Dispense Refill  . acetaminophen (TYLENOL) 650 MG CR tablet Take 650 mg by mouth every 8 (eight)  hours as needed for pain.    Marland Kitchen amLODipine (NORVASC) 10 MG tablet Take 1 tablet (10 mg total) by mouth daily. 90 tablet 4  . aspirin EC 81 MG tablet Take 81 mg by mouth daily.    Marland Kitchen atorvastatin (LIPITOR) 40 MG tablet Take 1 tablet (40 mg total) by mouth daily. 90 tablet 3  . clopidogrel (PLAVIX) 75 MG tablet Take 75 mg by mouth daily.    Marland Kitchen glipiZIDE (GLUCOTROL) 10 MG tablet Take 10 mg by mouth daily before breakfast.    . hydrochlorothiazide (HYDRODIURIL) 25 MG tablet Take 25 mg by mouth daily.    Marland Kitchen linagliptin (TRADJENTA) 5 MG TABS tablet Take 5 mg by mouth daily.    Marland Kitchen lisinopril (ZESTRIL) 40 MG tablet Take 40 mg by mouth daily.    . ondansetron (ZOFRAN-ODT) 4 MG disintegrating tablet Take 1 tablet (4 mg total) by mouth every 8 (eight) hours as needed for nausea or vomiting. (Patient not taking: Reported on 04/19/2020) 20 tablet 0  . predniSONE (DELTASONE) 50 MG tablet Take 1 tablet (50 mg total) by mouth daily with breakfast. (Patient not taking: Reported on 04/16/2020) 5 tablet 0  . traMADol (ULTRAM) 50 MG tablet Take 1 tablet (50 mg total) by mouth every 6 (six) hours as needed for moderate pain or severe pain. (Patient not taking: Reported on 04/16/2020) 20 tablet 0    There are no Patient Instructions on file for this visit. No follow-ups on file.   Kris Hartmann, NP

## 2020-04-19 NOTE — Progress Notes (Signed)
Right groin saturated with fresh blood. Spoke with Dr. Wyn Quaker in person and he states "I'm not surprised." MD stated to put pressure drsg. Over top of RFA site. Dressing taken down : no hematoma, edema, ecchymosis. New biopatches x2, sterile Tegaderm, then rolled 4x4 drsg. With biofoam tape. Pt. Tolerated well.

## 2020-04-19 NOTE — Op Note (Signed)
[facility]  Percutaneous Study/Intervention Procedural Note   Date of Surgery: 04/19/2020  Surgeon(s):DEW,JASON    Assistants:none  Pre-operative Diagnosis: PAD with rest pain LLE, acute on chronic ischemia after recent Covid infection  Post-operative diagnosis:  Same  Procedure(s) Performed:             1.  Ultrasound guidance for vascular access right femoral artery             2.  Catheter placement into left common femoral artery from right femoral approach             3.  Aortogram and selective left lower extremity angiogram             4.   Catheter directed thrombolytic therapy to the left SFA and popliteal arteries with 6 mg of TPA             5.   Mechanical thrombectomy with the penumbra CAT 7 device to the left SFA, popliteal artery, tibioperoneal trunk, and proximal peroneal artery  6.  Percutaneous transluminal angioplasty of the left peroneal artery and tibioperoneal trunk with 3 mm diameter by 30 cm length angioplasty balloon             7.   Percutaneous transluminal angioplasty of the entire left SFA and popliteal arteries with 2 inflations with a 5 mm diameter by 30 cm length Lutonix drug-coated angioplasty balloon  8.   Placement of 135 cm total length 50 cm working length catheter for continuous infusion of thrombolytic therapy  9.   Right femoral venous triple-lumen catheter placement with ultrasound and fluoroscopic guidance  EBL: 150 cc  Contrast: 75 cc  Fluoro Time: 11.6 minutes  Moderate Conscious Sedation Time: approximately 60 minutes using 8 mg of Versed and 175 mcg of Fentanyl              Indications:  Patient is a 53 y.o.male with acute on chronic ischemia of the left lower extremity likely precipitated by recent Covid infection. The patient has noninvasive study showing a critically reduced ABI of 0.09 with occlusion of the SFA. The patient is brought in for angiography for further evaluation and potential treatment.  Due to  the limb threatening nature of the situation, angiogram was performed for attempted limb salvage. The patient is aware that if the procedure fails, amputation would be expected.  The patient also understands that even with successful revascularization, amputation may still be required due to the severity of the situation.  Risks and benefits are discussed and informed consent is obtained.   Procedure:  The patient was identified and appropriate procedural time out was performed.  The patient was then placed supine on the table and prepped and draped in the usual sterile fashion. Moderate conscious sedation was administered during a face to face encounter with the patient throughout the procedure with my supervision of the RN administering medicines and monitoring the patient's vital signs, pulse oximetry, telemetry and mental status throughout from the start of the procedure until the patient was taken to the recovery room. Ultrasound was used to evaluate the right common femoral artery.  It was patent .  A digital ultrasound image was acquired.  A Seldinger needle was used to access the right common femoral artery under direct ultrasound guidance and a permanent image was performed.  A 0.035 J wire was advanced without resistance and a 5Fr sheath was placed.  Pigtail catheter was placed into the aorta and an   AP aortogram was performed. This demonstrated normal renal arteries and normal aorta and iliac segments without significant stenosis. I then crossed the aortic bifurcation and advanced to the left femoral head. Selective left lower extremity angiogram was then performed. This demonstrated a mildly diseased left common femoral artery with a small disease profunda femoris artery.  The SFA has essentially a flush occlusion with occlusion of all the previously placed stents.  Distal runoff was very poor, and the only thing that appeared to reconstitute was a peroneal artery after a proximal occlusion in the  proximal to mid segment.  This was fairly faint. It was felt that it was in the patient's best interest to proceed with intervention after these images to avoid a second procedure and a larger amount of contrast and fluoroscopy based off of the findings from the initial angiogram. The patient was systemically heparinized and a 7 French Ansell sheath was then placed over the Terumo Advantage wire. I then used a Kumpe catheter and the advantage wire to easily get into the SFA and popliteal artery.  I then instilled 6 mg of TPA with a Kumpe catheter in the left SFA and popliteal arteries and allow this to dwell.  I then got down into the peroneal artery where selective imaging showed this to be a small somewhat diseased vessel after the proximal occlusion but it was the only runoff distally identified.  I then placed a 0.018 wire and remove the Kumpe catheter.  The penumbra CAT 7 device was then brought onto the field and several passes were made starting at the origin of the left SFA running the entire length of the SFA and popliteal arteries down into the tibioperoneal trunk and the proximal portion of the peroneal arteries.  Several chunks of thrombus were removed, but there remained flow-limiting residual thrombus and stenosis throughout the entirety of the SFA and popliteal arteries with what appeared to be a high-grade stenosis or occlusion of the tibioperoneal trunk and proximal portion of the peroneal artery.  I then brought a 3 mm diameter by 30 cm length angioplasty balloon and treated from the mid peroneal artery up to the tibioperoneal trunk and into the popliteal artery inflating this to 10 atm for 1 minute.  The SFA and popliteal arteries were then addressed with 2 inflations with a 5 mm diameter by 30 cm length Lutonix drug-coated angioplasty balloon starting below the knee with inflation to 8 atm and then the proximal portion was inflated to 12 atm.  Completion imaging showed residual thrombosis with  essentially no flow distally and I felt the only hope for limb salvage at this point would be a continuous infusion of thrombolytic therapy.  A 135 cm total length 50 cm working length catheter was advanced to the proximal portion of the peroneal artery at the distal tip at the proximal portion of the proximal SFA.  This was secured into place and then stitch was used to secure the sheath in place as well.  To ensure durable venous access, a central line was placed in the right femoral vein.  Ultrasound was used to visualize a patent right femoral vein and this was accessed under direct ultrasound guidance without difficulty with a Seldinger needle.  A permanent image was recorded.  A J-wire was placed.  After skin nick and dilatation of triple-lumen cath was placed over the wire and the wire was removed.  All 3 lm withdrew dark red blood and flushed easily with sterile saline.  Fluoroscopy showed   the catheter tip at the iliac vein confluence to the IVC.  It was secured with 3 silk sutures.  Sterile dressings were placed.  The patient was taken to the recovery room in stable condition having tolerated the procedure well.  Findings:               Aortogram:  Normal renal arteries, normal aorta and iliac arteries without significant stenosis             Left lower Extremity:  Mildly diseased left common femoral artery with a small disease profunda femoris artery.  The SFA has essentially a flush occlusion with occlusion of all the previously placed stents.  Distal runoff was very poor, and the only thing that appeared to reconstitute was a peroneal artery after a proximal occlusion in the proximal to mid segment.  This was fairly faint.   Disposition: Patient was taken to the recovery room in stable condition having tolerated the procedure well.  Complications: None  Jason Dew 04/19/2020 11:29 AM   This note was created with Dragon Medical transcription system. Any errors in dictation are purely  unintentional. 

## 2020-04-20 ENCOUNTER — Encounter
Admission: RE | Disposition: A | Discharge status: Home / Self Care | Payer: Self-pay | Source: Home / Self Care | Attending: Vascular Surgery

## 2020-04-20 ENCOUNTER — Encounter: Payer: Self-pay | Admitting: Cardiology

## 2020-04-20 DIAGNOSIS — Z20822 Contact with and (suspected) exposure to covid-19: Secondary | ICD-10-CM | POA: Diagnosis present

## 2020-04-20 DIAGNOSIS — I70209 Unspecified atherosclerosis of native arteries of extremities, unspecified extremity: Secondary | ICD-10-CM | POA: Diagnosis present

## 2020-04-20 DIAGNOSIS — D649 Anemia, unspecified: Secondary | ICD-10-CM | POA: Diagnosis present

## 2020-04-20 DIAGNOSIS — Z86718 Personal history of other venous thrombosis and embolism: Secondary | ICD-10-CM | POA: Diagnosis not present

## 2020-04-20 DIAGNOSIS — Z880 Allergy status to penicillin: Secondary | ICD-10-CM | POA: Diagnosis not present

## 2020-04-20 DIAGNOSIS — I10 Essential (primary) hypertension: Secondary | ICD-10-CM | POA: Diagnosis present

## 2020-04-20 DIAGNOSIS — I998 Other disorder of circulatory system: Secondary | ICD-10-CM | POA: Diagnosis present

## 2020-04-20 DIAGNOSIS — Z79899 Other long term (current) drug therapy: Secondary | ICD-10-CM | POA: Diagnosis not present

## 2020-04-20 DIAGNOSIS — E1151 Type 2 diabetes mellitus with diabetic peripheral angiopathy without gangrene: Secondary | ICD-10-CM | POA: Diagnosis present

## 2020-04-20 DIAGNOSIS — Z8249 Family history of ischemic heart disease and other diseases of the circulatory system: Secondary | ICD-10-CM | POA: Diagnosis not present

## 2020-04-20 DIAGNOSIS — Z8616 Personal history of COVID-19: Secondary | ICD-10-CM | POA: Diagnosis not present

## 2020-04-20 DIAGNOSIS — I70221 Atherosclerosis of native arteries of extremities with rest pain, right leg: Secondary | ICD-10-CM | POA: Diagnosis not present

## 2020-04-20 DIAGNOSIS — Z7982 Long term (current) use of aspirin: Secondary | ICD-10-CM | POA: Diagnosis not present

## 2020-04-20 DIAGNOSIS — E785 Hyperlipidemia, unspecified: Secondary | ICD-10-CM | POA: Diagnosis present

## 2020-04-20 DIAGNOSIS — Z7902 Long term (current) use of antithrombotics/antiplatelets: Secondary | ICD-10-CM | POA: Diagnosis not present

## 2020-04-20 HISTORY — PX: LOWER EXTREMITY ANGIOGRAPHY: CATH118251

## 2020-04-20 LAB — FIBRINOGEN: Fibrinogen: 398 mg/dL (ref 210–475)

## 2020-04-20 LAB — GLUCOSE, CAPILLARY
Glucose-Capillary: 278 mg/dL — ABNORMAL HIGH (ref 70–99)
Glucose-Capillary: 258 mg/dL — ABNORMAL HIGH (ref 70–99)
Glucose-Capillary: 266 mg/dL — ABNORMAL HIGH (ref 70–99)
Glucose-Capillary: 246 mg/dL — ABNORMAL HIGH (ref 70–99)
Glucose-Capillary: 246 mg/dL — ABNORMAL HIGH (ref 70–99)
Glucose-Capillary: 192 mg/dL — ABNORMAL HIGH (ref 70–99)

## 2020-04-20 LAB — CBC
HCT: 32.8 % — ABNORMAL LOW (ref 39.0–52.0)
Hemoglobin: 11.6 g/dL — ABNORMAL LOW (ref 13.0–17.0)
MCH: 29 pg (ref 26.0–34.0)
MCHC: 35.4 g/dL (ref 30.0–36.0)
MCV: 82 fL (ref 80.0–100.0)
Platelets: 193 10*3/uL (ref 150–400)
RBC: 4 MIL/uL — ABNORMAL LOW (ref 4.22–5.81)
RDW: 12.8 % (ref 11.5–15.5)
WBC: 7.5 10*3/uL (ref 4.0–10.5)
nRBC: 0 % (ref 0.0–0.2)

## 2020-04-20 LAB — BASIC METABOLIC PANEL
Anion gap: 9 (ref 5–15)
BUN: 34 mg/dL — ABNORMAL HIGH (ref 6–20)
CO2: 26 mmol/L (ref 22–32)
Calcium: 8.7 mg/dL — ABNORMAL LOW (ref 8.9–10.3)
Chloride: 99 mmol/L (ref 98–111)
Creatinine, Ser: 1.15 mg/dL (ref 0.61–1.24)
GFR calc Af Amer: >60 mL/min (ref >60)
GFR calc non Af Amer: >60 mL/min (ref >60)
Glucose, Bld: 254 mg/dL — ABNORMAL HIGH (ref 70–99)
Potassium: 4.7 mmol/L (ref 3.5–5.1)
Sodium: 134 mmol/L — ABNORMAL LOW (ref 135–145)

## 2020-04-20 LAB — HEMOGLOBIN A1C
Hgb A1c MFr Bld: 10.1 % — ABNORMAL HIGH (ref 4.8–5.6)
Mean Plasma Glucose: 243.17 mg/dL

## 2020-04-20 LAB — HEPARIN LEVEL (UNFRACTIONATED)
Heparin Unfractionated: >3.6 IU/mL — ABNORMAL HIGH (ref 0.30–0.70)
Heparin Unfractionated: 2.94 IU/mL — ABNORMAL HIGH (ref 0.30–0.70)

## 2020-04-20 LAB — MAGNESIUM: Magnesium: 2.6 mg/dL — ABNORMAL HIGH (ref 1.7–2.4)

## 2020-04-20 SURGERY — LOWER EXTREMITY ANGIOGRAPHY
Anesthesia: Moderate Sedation | Laterality: Left

## 2020-04-20 MED ORDER — TIROFIBAN HCL IN NACL 5-0.9 MG/100ML-% IV SOLN
0.1500 ug/kg/min | INTRAVENOUS | Status: DC
  Administered 2020-04-20: 22:00:00 0.15 ug/kg/min via INTRAVENOUS
  Filled 2020-04-20 (×4): qty 100

## 2020-04-20 MED ORDER — MIDAZOLAM HCL 2 MG/2ML IJ SOLN
INTRAMUSCULAR | Status: DC | PRN
  Administered 2020-04-20 (×3): 1 mg via INTRAVENOUS
  Administered 2020-04-20: 2 mg via INTRAVENOUS

## 2020-04-20 MED ORDER — FENTANYL CITRATE (PF) 100 MCG/2ML IJ SOLN
INTRAMUSCULAR | Status: DC | PRN
  Administered 2020-04-20 (×4): 25 ug via INTRAVENOUS
  Administered 2020-04-20: 50 ug via INTRAVENOUS

## 2020-04-20 MED ORDER — TIROFIBAN (AGGRASTAT) BOLUS VIA INFUSION
25.0000 ug/kg | Freq: Once | INTRAVENOUS | Status: DC
  Filled 2020-04-20: qty 43

## 2020-04-20 MED ORDER — TIROFIBAN HCL IV 12.5 MG/250 ML
INTRAVENOUS | Status: AC
  Administered 2020-04-20: 09:00:00 0.15 ug/kg/min via INTRAVENOUS
  Filled 2020-04-20: qty 250

## 2020-04-20 MED ORDER — HEPARIN SODIUM (PORCINE) 1000 UNIT/ML IJ SOLN
INTRAMUSCULAR | Status: DC | PRN
  Administered 2020-04-20: 3000 [IU] via INTRAVENOUS

## 2020-04-20 MED ORDER — HEPARIN SODIUM (PORCINE) 1000 UNIT/ML IJ SOLN
INTRAMUSCULAR | Status: AC
  Filled 2020-04-20: qty 1

## 2020-04-20 MED ORDER — CLINDAMYCIN PHOSPHATE 300 MG/50ML IV SOLN
INTRAVENOUS | Status: AC
  Filled 2020-04-20: qty 50

## 2020-04-20 MED ORDER — FENTANYL CITRATE (PF) 100 MCG/2ML IJ SOLN
INTRAMUSCULAR | Status: AC
  Filled 2020-04-20: qty 2

## 2020-04-20 MED ORDER — ADULT MULTIVITAMIN W/MINERALS CH
1.0000 | ORAL_TABLET | Freq: Every day | ORAL | Status: DC
  Administered 2020-04-21: 1 via ORAL
  Filled 2020-04-20: qty 1

## 2020-04-20 MED ORDER — CLINDAMYCIN PHOSPHATE 300 MG/50ML IV SOLN
300.0000 mg | Freq: Once | INTRAVENOUS | Status: AC
  Administered 2020-04-20: 08:00:00 300 mg via INTRAVENOUS

## 2020-04-20 MED ORDER — MIDAZOLAM HCL 5 MG/5ML IJ SOLN
INTRAMUSCULAR | Status: AC
  Filled 2020-04-20: qty 5

## 2020-04-20 SURGICAL SUPPLY — 17 items
BALLN LUTONIX 018 4X150X130 (BALLOONS) ×3
BALLN LUTONIX 018 5X100X130 (BALLOONS) ×3
BALLN ULTRVRSE 2.5X300X150 (BALLOONS) ×3
BALLOON LUTONIX 018 4X150X130 (BALLOONS) ×1 IMPLANT
BALLOON LUTONIX 018 5X100X130 (BALLOONS) ×1 IMPLANT
BALLOON ULTRVRSE 2.5X300X150 (BALLOONS) ×1 IMPLANT
CANISTER PENUMBRA ENGINE (MISCELLANEOUS) ×3 IMPLANT
CATH CXI SUPP ANG 4FR 135 (CATHETERS) ×1 IMPLANT
CATH CXI SUPP ANG 4FR 135CM (CATHETERS) ×3
CATH INDIGO CAT6 KIT (CATHETERS) ×3 IMPLANT
DEVICE PRESTO INFLATION (MISCELLANEOUS) ×3 IMPLANT
DEVICE STARCLOSE SE CLOSURE (Vascular Products) ×3 IMPLANT
PACK ANGIOGRAPHY (CUSTOM PROCEDURE TRAY) ×3 IMPLANT
SHEATH DESTIN RDC 6FR 45 (SHEATH) ×3 IMPLANT
STENT VIABAHN 6X100X120 (Permanent Stent) ×6 IMPLANT
WIRE G V18X300CM (WIRE) ×3 IMPLANT
WIRE J 3MM .035X145CM (WIRE) ×6 IMPLANT

## 2020-04-20 NOTE — Progress Notes (Signed)
ANTICOAGULATION CONSULT NOTE  Pharmacy Consult for Heparin Indication: VTE treatment; ischemia of LLE  Allergies  Allergen Reactions  . Penicillins Rash    .Has patient had a PCN reaction causing immediate rash, facial/tongue/throat swelling, SOB or lightheadedness with hypotension: Unknown Has patient had a PCN reaction causing severe rash involving mucus membranes or skin necrosis: Unknown Has patient had a PCN reaction that required hospitalization: Unknown Has patient had a PCN reaction occurring within the last 10 years: Unknown If all of the above answers are "NO", then may proceed with Cephalosporin use.     Patient Measurements: Height: 6\' 2"  (188 cm) Weight: 83.9 kg (184 lb 15.5 oz) IBW/kg (Calculated) : 82.2 Heparin Dosing Weight: 83.9 kg  Vital Signs: Temp: 98 F (36.7 C) (05/11 0000) Temp Source: Oral (05/11 0000) BP: 146/85 (05/11 0000) Pulse Rate: 78 (05/11 0000)  Labs: Recent Labs    04/19/20 0853 04/19/20 1153 04/19/20 1153 04/19/20 1437 04/19/20 2222  HGB  --  12.4*   < > 12.2* 12.9*  HCT  --  35.0*  --  35.4* 38.1*  PLT  --  217  --  215 233  HEPARINUNFRC  --  >3.60*  --  >3.60* >3.60*  CREATININE 1.15  --   --  0.99  --   TROPONINIHS  --   --   --  12  --    < > = values in this interval not displayed.    Estimated Creatinine Clearance: 98 mL/min (by C-G formula based on SCr of 0.99 mg/dL).   Medical History: Past Medical History:  Diagnosis Date  . Abscess 10/05/15  . Diabetes mellitus without complication (HCC)   . 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 Hyperlipidemia   . Hypertension    Assessment: 54 yo male s/p mechanical thrombectomy and angioplasty to start on heparin drip per MD goal of heparin level 0.2 - 0.5 units/mL.  Goal of Therapy:  Maintain heparin level 0.2 - 0.5 units/mL Monitor platelets by anticoagulation protocol: Yes   Plan:  Heparin level SUPRAtherapeutic, > 3.60 x 2 consecutive checks.  Spoke w/ RN and some bleeding noted  earlier.  Labs CBC and HL every 6 hours x4 post-procedure.  Will reduce Heparin to 400 units/hr and f/u labs in am  Pharmacy will continue to follow.   53 A 04/20/2020,12:29 AM

## 2020-04-20 NOTE — Progress Notes (Signed)
Initial Nutrition Assessment  DOCUMENTATION CODES:   Not applicable  INTERVENTION:   MVI daily   Liberalize diet   Double protein with meal trays   NUTRITION DIAGNOSIS:   Inadequate oral intake related to decreased appetite(pt depressed from recent loss of his wife) as evidenced by per patient/family report.  GOAL:   Patient will meet greater than or equal to 90% of their needs  MONITOR:   PO intake, Labs, Weight trends, Skin, I & O's  REASON FOR ASSESSMENT:   Consult Assessment of nutrition requirement/status  ASSESSMENT:   54 y.o. male with h/o DM who is admitted with left lower extremity ischemia s/p angiogram and thrombectomy 5/11   RD met with pt in room today. Pt reports decreased appetite and oral intake for several weeks pta r/t the recent loss of his wife. Pt reports that he has been eating at least 2 meals per day. For breakfast, pt usually eats eggs and bacon and his daughter usually brings him something cooked for dinner. Pt reports that he has lost a couple of pounds; his UBW is ~188lbs. RD discussed with pt the importance of adequate nutrition needed to preserve lean muscle. Pt reports that he does not like supplements and he does not wish to have any supplements in hospital. RD will liberalize pt's diet as a heart healthy diet is restrictive of protein. RD will also request double protein with meal trays.   Medications reviewed and include: heparin, insulin, MVI, NaCl _0 /hr, clindamycin   Labs reviewed: Na 134(L), BUN 34(H), P 3.3 wnl, Mg 2.6(H) cbgs- 258, 266, 246 x 24 hrs AIC 10.1(H)- 5/11  NUTRITION - FOCUSED PHYSICAL EXAM:    Most Recent Value  Orbital Region  No depletion  Upper Arm Region  Mild depletion  Thoracic and Lumbar Region  No depletion  Buccal Region  No depletion  Temple Region  No depletion  Clavicle Bone Region  No depletion  Clavicle and Acromion Bone Region  No depletion  Scapular Bone Region  No depletion  Dorsal Hand  No  depletion  Patellar Region  No depletion  Anterior Thigh Region  No depletion  Posterior Calf Region  No depletion  Edema (RD Assessment)  None  Hair  Reviewed  Eyes  Reviewed  Mouth  Reviewed  Skin  Reviewed  Nails  Reviewed     Diet Order:   Diet Order            Diet regular Room service appropriate? Yes; Fluid consistency: Thin  Diet effective now             EDUCATION NEEDS:   Education needs have been addressed  Skin:  Skin Assessment: Reviewed RN Assessment(incision groin)  Last BM:  5/9  Height:   Ht Readings from Last 1 Encounters:  04/19/20 6' 2" (1.88 m)    Weight:   Wt Readings from Last 1 Encounters:  04/20/20 84.5 kg    Ideal Body Weight:  86 kg  BMI:  Body mass index is 23.92 kg/m.  Estimated Nutritional Needs:   Kcal:  2100-2400kcal/day  Protein:  105-120g/day  Fluid:  >2.5L/day  Koleen Distance MS, RD, LDN Please refer to [facility] for RD and/or RD on-call/weekend/after hours pager

## 2020-04-20 NOTE — Progress Notes (Signed)
Inpatient Diabetes Program Recommendations  AACE/ADA: New Consensus Statement on Inpatient Glycemic Control (2015)  Target Ranges:  Prepandial:   less than 140 mg/dL      Peak postprandial:   less than 180 mg/dL (1-2 hours)      Critically ill patients:  140 - 180 mg/dL   Results for DAMAREON, LANNI (MRN [id number]) as of 04/20/2020 07:35  Ref. Range 04/19/2020 11:56 04/19/2020 16:11 04/19/2020 21:33  Glucose-Capillary Latest Ref Range: 70 - 99 mg/dL 622 (H)  8 units NOVOLOG given at 2:31pm 223 (H)  5 units NOVOLOG  202 (H)  2 units NOVOLOG    Results for DARDAN, SHELTON (MRN [id number]) as of 04/20/2020 07:35  Ref. Range 04/20/2020 07:06  Glucose-Capillary Latest Ref Range: 70 - 99 mg/dL 941 (H)    Admit with: PAD with rest pain LLE, acute on chronic ischemia after recent Covid infection  History: DM  Home DM Meds: Glipizide 10 mg Daily       Tradjenta 5 mg Daily  Current Orders: Novolog Moderate Correction Scale/ SSI (0-15 units) TID AC + HS     s/p mechanical thrombectomy and angioplasty   Note CBG elevated to 258 this AM    MD- If plan is to keep patient another day, may consider adding low dose basal insulin while home oral diabetes meds are on hold:  Lantus 8 units Daily (0.1 units/kg dosing)     --Will follow patient during hospitalization--  Ambrose Finland RN, MSN, CDE Diabetes Coordinator Inpatient Glycemic Control Team Team Pager: [phone] (8a-5p)

## 2020-04-20 NOTE — Op Note (Addendum)
[facility]  Percutaneous Study/Intervention Procedural Note   Date of Surgery:  04/20/2020  Surgeon(s):DEW,JASON    Assistants:none  Pre-operative Diagnosis: PAD with rest Padilla left lower extremity, status post overnight thrombolytic therapy  Post-operative diagnosis:  Same  Procedure(s) Performed:             1.   Left lower extremity angiogram             2.   Mechanical thrombectomy to left SFA, popliteal artery, tibioperoneal trunk, and peroneal artery with the penumbra cat 6 device             3.   Percutaneous transluminal angioplasty of left peroneal artery with 2.5 mm diameter angioplasty balloon end of the tibioperoneal trunk and distal popliteal artery with 4 mm diameter Lutonix drug-coated angioplasty balloon             4.   Viabahn stent placement of the popliteal artery with 6 mm diameter by 10 cm length stent             5.   Viabahn stent placement of the left SFA proximally with a 6 mm diameter by 10 cm length stent  6.   StarClose closure device right femoral artery  EBL: 200 cc  Contrast: 45 cc  Fluoro Time: 8.8 minutes  Moderate Conscious Sedation Time: approximately 60 minutes using 5 mg of Versed and 150 mcg of Fentanyl              Indications:  Patient is a 54 y.o.male with left lower extremity ischemia. The patient has noninvasive study showing an ABI of 0.09.  He has been getting overnight thrombolytic therapy and is brought back for angiography. The patient is brought in for angiography for further evaluation and potential treatment.  Due to the limb threatening nature of the situation, angiogram was performed for attempted limb salvage. The patient is aware that if the procedure fails, amputation would be expected.  The patient also understands that even with successful revascularization, amputation may still be required due to the severity of the situation.  Risks and benefits are discussed and informed consent is obtained.    Procedure:  The patient was identified and appropriate procedural time out was performed.  The patient was then placed supine on the table and prepped and draped in the usual sterile fashion. Moderate conscious sedation was administered during a face to face encounter with the patient throughout the procedure with my supervision of the RN administering medicines and monitoring the patient's vital signs, pulse oximetry, telemetry and mental status throughout from the start of the procedure until the patient was taken to the recovery room.  The existing thrombolytic catheter was removed and replaced with a 0.018 wire.  Selective left lower extremity angiogram was then performed. This demonstrated moderate amount of thrombus and moderate stenosis in the proximal SFA just above the previously placed stent.  The previously placed stents were now patent without focal stenosis or thrombus.  There was stenosis and thrombus creating moderate to high-grade stenosis in the 70 to 80% range in the popliteal artery below the previously placed stent.  There remained occlusion of the peroneal artery in the proximal and mid segment with distal reconstitution.  The anterior tibial artery was occluded in the proximal to mid segment as well but did have some distal reconstitution.  The posterior tibial artery was not seen and remained occluded. It was felt that it was in the patient's best  interest to proceed with intervention after these images to avoid a second procedure and a larger amount of contrast and fluoroscopy based off of the findings from the initial angiogram. The patient was given intravenous heparin and a CXI catheter and a V 18 wire were used to get down into the peroneal artery where selective imaging showed patency from the mid peroneal artery distally.  The wire was then parked at the ankle.  The penumbra cat 6 catheter was then used to perform mechanical thrombectomy in the proximal SFA, in the popliteal artery,  and then in the tibioperoneal trunk and peroneal artery.  3 passes were done in the left lower extremity to remove thrombus burden.  Imaging following this showed residual occlusion of the peroneal artery in the proximal to mid segment, stenosis in the 70% range with a small amount of residual thrombus in the popliteal artery, and stenosis in the 60% range with a small amount of residual thrombus in the proximal left SFA above the previously placed stents. I then used a 2.5 mm diameter by 30 cm length angioplasty balloon to treat the peroneal artery from just above the ankle up to the tibioperoneal trunk.  This is inflated to 12 atm for 1 minute.  There remained stenosis and thrombus in the proximal portion of the peroneal artery and the tibioperoneal trunk and this was treated with a 4 mm diameter by 15 cm length Lutonix drug-coated angioplasty balloon inflated to 6 atm for 1 minute.  The popliteal lesion was treated with a 6 mm diameter by 10 cm length Viabahn stent postdilated with a 5 mm Lutonix drug-coated angioplasty balloon.  The proximal SFA was treated with a 6 mm diameter by 10 cm length Viabahn stent postdilated with a 5 mm diameter angioplasty balloon.  Completion imaging showed less than 10% residual stenosis in the SFA and popliteal arteries.  The tibioperoneal trunk and the peroneal artery had mild residual stenosis in the 20 to 30% range but the flow was reasonably brisk.  There remains some mild residual thrombus in the peroneal artery and the occlusive thrombus in the anterior tibial artery, and I elected to place him on an Aggrastat drip in hopes of improving this.  I elected to terminate the procedure. The sheath was removed and StarClose closure device was deployed in the right femoral artery with excellent hemostatic result. The patient was taken to the recovery room in stable condition having tolerated the procedure well.  Findings:                         Left Lower Extremity:  Moderate  amount of thrombus and moderate stenosis in the proximal SFA just above the previously placed stent.  The previously placed stents were now patent without focal stenosis or thrombus.  There was stenosis and thrombus creating moderate to high-grade stenosis in the 70 to 80% range in the popliteal artery below the previously placed stent.  There remained occlusion of the peroneal artery in the proximal and mid segment with distal reconstitution.  The anterior tibial artery was occluded in the proximal to mid segment as well but did have some distal reconstitution.  The posterior tibial artery was not seen and remained occluded.   Disposition: Patient was taken to the recovery room in stable condition having tolerated the procedure well.  Complications: None  Jeremy Padilla 04/20/2020 8:44 AM   This note was created with Dragon Medical transcription system. Any errors in dictation  are purely unintentional.

## 2020-04-21 DIAGNOSIS — I998 Other disorder of circulatory system: Principal | ICD-10-CM

## 2020-04-21 LAB — BASIC METABOLIC PANEL
Anion gap: 7 (ref 5–15)
BUN: 28 mg/dL — ABNORMAL HIGH (ref 6–20)
CO2: 26 mmol/L (ref 22–32)
Calcium: 8.3 mg/dL — ABNORMAL LOW (ref 8.9–10.3)
Chloride: 102 mmol/L (ref 98–111)
Creatinine, Ser: 1.12 mg/dL (ref 0.61–1.24)
GFR calc Af Amer: >60 mL/min (ref >60)
GFR calc non Af Amer: >60 mL/min (ref >60)
Glucose, Bld: 216 mg/dL — ABNORMAL HIGH (ref 70–99)
Potassium: 4 mmol/L (ref 3.5–5.1)
Sodium: 135 mmol/L (ref 135–145)

## 2020-04-21 LAB — CBC
HCT: 27.9 % — ABNORMAL LOW (ref 39.0–52.0)
Hemoglobin: 9.4 g/dL — ABNORMAL LOW (ref 13.0–17.0)
MCH: 28.4 pg (ref 26.0–34.0)
MCHC: 33.7 g/dL (ref 30.0–36.0)
MCV: 84.3 fL (ref 80.0–100.0)
Platelets: 190 10*3/uL (ref 150–400)
RBC: 3.31 MIL/uL — ABNORMAL LOW (ref 4.22–5.81)
RDW: 12.8 % (ref 11.5–15.5)
WBC: 8.2 10*3/uL (ref 4.0–10.5)
nRBC: 0 % (ref 0.0–0.2)

## 2020-04-21 LAB — GLUCOSE, CAPILLARY
Glucose-Capillary: 216 mg/dL — ABNORMAL HIGH (ref 70–99)
Glucose-Capillary: 219 mg/dL — ABNORMAL HIGH (ref 70–99)

## 2020-04-21 MED ORDER — OXYCODONE HCL 5 MG PO TABS: 5.0000 mg | ORAL_TABLET | Freq: Four times a day (QID) | ORAL | 0 refills | Status: DC | PRN

## 2020-04-21 MED ORDER — CLOPIDOGREL BISULFATE 75 MG PO TABS
75.0000 mg | ORAL_TABLET | Freq: Every day | ORAL | Status: DC
  Administered 2020-04-21: 75 mg via ORAL
  Filled 2020-04-21: qty 1

## 2020-04-21 MED ORDER — APIXABAN 5 MG PO TABS: 5.0000 mg | ORAL_TABLET | Freq: Two times a day (BID) | ORAL | 5 refills | Status: DC

## 2020-04-21 MED ORDER — INSULIN GLARGINE 100 UNIT/ML ~~LOC~~ SOLN
8.0000 [IU] | Freq: Every day | SUBCUTANEOUS | Status: DC
  Administered 2020-04-21: 12:00:00 8 [IU] via SUBCUTANEOUS
  Filled 2020-04-21 (×2): qty 0.08

## 2020-04-21 MED ORDER — APIXABAN 5 MG PO TABS
5.0000 mg | ORAL_TABLET | Freq: Two times a day (BID) | ORAL | Status: DC
  Administered 2020-04-21: 15:00:00 5 mg via ORAL
  Filled 2020-04-21: qty 1

## 2020-04-21 MED ORDER — ASPIRIN EC 81 MG PO TBEC
81.0000 mg | DELAYED_RELEASE_TABLET | Freq: Every day | ORAL | Status: DC
  Administered 2020-04-21: 11:00:00 81 mg via ORAL
  Filled 2020-04-21: qty 1

## 2020-04-21 NOTE — Progress Notes (Signed)
[place] visited pt. per Rn referral --> pt. emotionally distraught and wants to go home, recently lost wife to [employer].  [facility] realized pt.'s deceased wife was pt. on ICU two wks. ago.  When [place] entered rm. pt. lying down in bed; he said he has been having a hard time --> wants to go home and is grieving the loss of his wife.  Pt. became very tearful when he described his wife, and shared that he had visited her on her deathbed, but could not bring himself to stay.  When pt. revealed wife's name, [place] shared he had met and had an extended conversation w/pt.'s wife before she died several weeks ago.  Pt. became even more tearful at hearing this and expressed gratitude for knowing pt. had been visited by [facility].  [place] paged duirng visit, but provided grief support and life review before having to leave.  [place] will attempt follow-up tomorrow if possible.    04/20/20 1600  Clinical Encounter Type  Visited With Patient  Visit Type Initial;Psychological support;Spiritual support;Social support;Critical Care  Referral From Nurse  Consult/Referral To Chaplain  Spiritual Encounters  Spiritual Needs Grief support;Emotional  Stress Factors  Patient Stress Factors Loss;Major life changes;Health changes

## 2020-04-21 NOTE — Discharge Planning (Signed)
Discharge instructions given. Patient verbalizes understanding. Will take patient to car via WC.

## 2020-04-21 NOTE — Consult Note (Signed)
Name: Jeremy Padilla MRN: [id number] DOB: [birth year]    ADMISSION DATE:  04/19/2020 CONSULTATION DATE:  04/20/2020  REFERRING MD :  Dr. Lucky Cowboy  CHIEF COMPLAINT:  Rest pain of LLE  BRIEF PATIENT DESCRIPTION:  54 y.o. Male with PAD with rest pain of LLE, on 04/19/20 underwent elective LLE Angiogram, catheter directed thrombolytic therapy and mechanical throbectomy to left SFA, popliteal arteries, tibioperoneal trunk, and proximal peroneal artery.  Did require continuous infusion of thrombolytic therapy overnight on 5/10, returned on 5/11 for repeat Angiogram, angioplasty, mechanical thrombectomy, and stent placement.  He returned to ICU post procedure.  PCCM cnosulted for medical management while in ICU.   SIGNIFICANT EVENTS  5/10: Elective angiogram, catheter directed thrombolytic therapy, and mechanical thrombectomy. 5/10: Admitted to ICU for infusion of thrombolytic therapy overnight 5/11: Repeat Angioplasty, mechanical thrombectomy, & Stent Placement 5/11: PCCM consulted for medical management while in ICU  STUDIES:  N/A  CULTURES: MRSA PCR 5/10>> Negative  ANTIBIOTICS: Clindamycin x1 dose 5/11 (surgical prophylaxis)   HISTORY OF PRESENT ILLNESS:   Jeremy Padilla is a 54 year old male with a past medical history significant for recent COVID-19 infection 03/27/20, peripheral artery disease, diabetes mellitus, hyperlipidemia, and hypertension who presented to [facility] for elective left lower extremity angiogram on 04/19/2020 due to rest pain of the left lower extremity.  On 5/10 he required catheter directed thrombolytic therapy to the left SFA and popliteal arteries, along with mechanical thrombectomy.  He returned to ICU post procedure for overnight infusion of thrombolytic therapy.  On 5/11 he had repeat left lower extremity angiogram with mechanical thrombectomy to the left SFA, popliteal artery, tibioperoneal trunk, and peroneal artery, along with stent placement to the popliteal  artery and left SFA.  He again returns to ICU post procedure for closer monitoring.  PCCM is consulted for medical management while in ICU.   PAST MEDICAL HISTORY :   has a past medical history of Abscess (10/05/15), Diabetes mellitus without complication ([place]), Herpes exposure, Hyperlipidemia, and Hypertension.  has a past surgical history that includes Wrist surgery (Left); Lower Extremity Angiography (Left, 04/09/2019); LOWER EXTREMITY INTERVENTION (N/A, 05/30/2019); Lower Extremity Angiography (Left, 04/19/2020); and Lower Extremity Angiography (Left, 04/20/2020). Prior to Admission medications   Medication Sig Start Date End Date Taking? Authorizing Provider  acetaminophen (TYLENOL) 650 MG CR tablet Take 650 mg by mouth every 8 (eight) hours as needed for pain.   Yes [provider]  amLODipine (NORVASC) 10 MG tablet Take 1 tablet (10 mg total) by mouth daily. 08/03/17  Yes Minna Merritts, MD  aspirin EC 81 MG tablet Take 81 mg by mouth daily.   Yes [provider]  atorvastatin (LIPITOR) 40 MG tablet Take 1 tablet (40 mg total) by mouth daily. 08/03/17  Yes Minna Merritts, MD  clopidogrel (PLAVIX) 75 MG tablet Take 75 mg by mouth daily.   Yes [provider]  hydrochlorothiazide (HYDRODIURIL) 25 MG tablet Take 25 mg by mouth daily.   Yes [provider]  linagliptin (TRADJENTA) 5 MG TABS tablet Take 5 mg by mouth daily. 04/07/19  Yes [provider]  lisinopril (ZESTRIL) 40 MG tablet Take 40 mg by mouth daily.   Yes [provider]  glipiZIDE (GLUCOTROL) 10 MG tablet Take 10 mg by mouth daily before breakfast.    [provider]   Allergies  Allergen Reactions  . Penicillins Rash    .Has patient had a PCN reaction causing immediate rash, facial/tongue/throat swelling, SOB or lightheadedness with hypotension:  Unknown Has patient had a PCN reaction causing severe rash involving mucus membranes or skin necrosis: Unknown Has  patient had a PCN reaction that required hospitalization: Unknown Has patient had a PCN reaction occurring within the last 10 years: Unknown If all of the above answers are "NO", then may proceed with Cephalosporin use.     FAMILY HISTORY:  family history includes Heart disease in his father; Hyperlipidemia in his father; Hypertension in his father. SOCIAL HISTORY:  reports that he has never smoked. He has never used smokeless tobacco. He reports that he does not drink alcohol or use drugs.   COVID-19 DISASTER DECLARATION:  FULL CONTACT PHYSICAL EXAMINATION WAS NOT POSSIBLE DUE TO TREATMENT OF COVID-19 AND  CONSERVATION OF PERSONAL PROTECTIVE EQUIPMENT, LIMITED EXAM FINDINGS INCLUDE-  Patient assessed or the symptoms described in the history of present illness.  In the context of the Global COVID-19 pandemic, which necessitated consideration that the patient might be at risk for infection with the SARS-CoV-2 virus that causes COVID-19, Institutional protocols and algorithms that pertain to the evaluation of patients at risk for COVID-19 are in a state of rapid change based on information released by regulatory bodies including the CDC and federal and state organizations. These policies and algorithms were followed during the patient's care while in hospital.  REVIEW OF SYSTEMS:  Positives in BOLD: Pt denies all complaints Constitutional: Negative for fever, chills, weight loss, malaise/fatigue and diaphoresis.  HENT: Negative for hearing loss, ear pain, nosebleeds, congestion, sore throat, neck pain, tinnitus and ear discharge.   Eyes: Negative for blurred vision, double vision, photophobia, pain, discharge and redness.  Respiratory: Negative for cough, hemoptysis, sputum production, shortness of breath, wheezing and stridor.   Cardiovascular: Negative for chest pain, palpitations, orthopnea, claudication, leg swelling and PND.  Gastrointestinal: Negative for heartburn, nausea, vomiting,  abdominal pain, diarrhea, constipation, blood in stool and melena.  Genitourinary: Negative for dysuria, urgency, frequency, hematuria and flank pain.  Musculoskeletal: Negative for myalgias, back pain, joint pain and falls.  Skin: Negative for itching and rash.  Neurological: Negative for dizziness, tingling, tremors, sensory change, speech change, focal weakness, seizures, loss of consciousness, weakness and headaches.  Endo/Heme/Allergies: Negative for environmental allergies and polydipsia. Does not bruise/bleed easily.  SUBJECTIVE:  Pt denies chest pain, SOB, abdominal pain, N/V/D, Fever/chills, LLE pain On room air  VITAL SIGNS: Temp:  [97.9 F (36.6 C)-98.4 F (36.9 C)] 98.2 F (36.8 C) (05/12 0000) Pulse Rate:  [73-105] 92 (05/12 0100) Resp:  [10-19] 17 (05/12 0100) BP: (99-177)/(61-118) 117/71 (05/12 0100) SpO2:  [97 %-100 %] 97 % (05/12 0100) Weight:  [84.5 kg] 84.5 kg (05/11 0456)  PHYSICAL EXAMINATION: General: Acutely ill-appearing male, sitting in bed, on room air, no acute distress Neuro: Awake, alert and oriented x4, follows commands, no focal deficits, speech clear HEENT: Atraumatic, normocephalic, neck supple, no JVD, pupils PERRLA Cardiovascular: Tachycardia, regular rhythm, S1-S2, no murmurs, rubs, gallops, 1+ pedal pulses Lungs: Clear to auscultation bilaterally, even, nonlabored Abdomen: Soft, nontender, nondistended, no guarding or rebound tenderness, bowel sounds positive x4 Musculoskeletal: Normal bulk and tone, no deformities, no edema Skin: Warm and dry, no obvious rashes, lesions, ulcerations  Recent Labs  Lab 04/19/20 0853 04/19/20 1437 04/20/20 0617  NA  --  133* 134*  K  --  4.1 4.7  CL  --  100 99  CO2  --  23 26  BUN 31* 27* 34*  CREATININE 1.15 0.99 1.15  GLUCOSE  --  262* 254*  Recent Labs  Lab 04/19/20 1437 04/19/20 2222 04/20/20 0617  HGB 12.2* 12.9* 11.6*  HCT 35.4* 38.1* 32.8*  WBC 9.2 10.2 7.5  PLT 215 233 193    PERIPHERAL VASCULAR CATHETERIZATION  Result Date: 04/20/2020 See op note  PERIPHERAL VASCULAR CATHETERIZATION  Result Date: 04/19/2020 See op note   ASSESSMENT / PLAN:  PAD with LLE Rest pain s/p Angioplasty, mechanical thrombectomy, & Stent placement -Vascular surgery following, will follow recommendations -Aggrastat infusion as per Vascular surgery -Treat HTN, HLD, and DM -Pain control -Incentive spirometry  Hypertension Hyperlipidemia -Continuous cardiac monitoring -IV fluids -Maintain BP < 140/90 -Continue Norvasc, Lisinopril, & HCTZ -Continue Lipitor  Diabetes Mellitus -CBG's -SSI -Follow ICU hypo/hyperglycemia protocol -Diabetes coordinator following, appreciate input  Anemia with obvious bleeding -Monitor for S/Sx of bleeding -Trend CBC -SCD's for VTE Prophylaxis  -Transfuse for Hgb <8 -Pt on Aggrastat infusion as per Vascular Surgery    BEST PRACTICES: DISPOSITION: ICU GOALS OF CARE: Full Code VTE PROPHYLAXIS: SCD's CONSULTS: Vascular Surgery (Primary service) UPDATES: Updated pt at bedside 04/21/20   Harlon Ditty, AGACNP-BC [facility] Pager: [phone]  04/21/2020, 1:32 AM

## 2020-04-21 NOTE — Progress Notes (Addendum)
Inpatient Diabetes Program Recommendations  AACE/ADA: New Consensus Statement on Inpatient Glycemic Control (2015)  Target Ranges:  Prepandial:   less than 140 mg/dL      Peak postprandial:   less than 180 mg/dL (1-2 hours)      Critically ill patients:  140 - 180 mg/dL   Lab Results  Component Value Date   GLUCAP 219 (H) 04/21/2020   HGBA1C 10.1 (H) 04/20/2020    Review of Glycemic Control  Diabetes history: DM 2 Outpatient Diabetes medications: per pt report he takes Glipizide 5 mg Daily Current orders for Inpatient glycemic control:  Lantus 8 units Daily Novolog 0-15 units tid + hs   Inpatient Diabetes Program Recommendations:    Lantus started this am.  A1c 10.1%   Spoke with pt about A1c level. Pt reports being prescribed prednisone after his last hospitalization with COVID however he did not take it at home. Pt reports his glucose trends have been in the upper 100 range when he does check it. Spoke with patient about his current glucose trends and his A1c. Discussed possibly may need insulin one shot a day with basal insulin at time of d/c. Showed pt the insulin pen.   Will follow glucose trends for now with the addition of Lantus.  I feel pt may benefit from basal insulin at home when d/c'd.   Have called the pt's pharmacy to see what insulins they have in stock as pt is uninsured.  Pt knows to check glucose at least 3 times a day.  Spoke with pt's pharmacy (lantus/Levemir insulin pens) would be $12 a box of 5. Pen needles $20-25 for 100 ct.. They would need to know what pt would be d/c'd on tomorrow 5/13 (escripts sent) so they can send a note to his PCP for approval and coverage of insulin (last visit in Sept 2020 A1c at that time 7.7%).  Pharmacy checked and he has not gotten his glipizide but rather his Tradjenta filled 5 mg Daily, glipizide ran out in April and has not been filled again. Pt probably thought it was in place of glipizide.   If insulin is started at  d/c would hold glipizide and continue tradjenta outpt.  Thanks,  Christena Deem RN, MSN, BC-ADM Inpatient Diabetes Coordinator Team Pager [phone] (8a-5p)

## 2020-04-22 NOTE — Discharge Summary (Signed)
[facility]    Discharge Summary  Patient ID:  Jeremy Padilla MRN: [id number] DOB/AGE: [birth year] 54 y.o.  Admit date: 04/19/2020 Discharge date: 04/22/2020 Date of Surgery: 04/20/2020 Surgeon: Surgeon(s): Algernon Huxley, MD  Admission Diagnosis: Ischemia of left lower extremity [I99.8] Ischemic leg [I99.8]  Discharge Diagnoses:  Ischemia of left lower extremity [I99.8] Ischemic leg [I99.8]  Secondary Diagnoses: Past Medical History:  Diagnosis Date  . Abscess 10/05/15  . Diabetes mellitus without complication ([place])   . 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 Hyperlipidemia   . Hypertension    Procedure(s): 04/19/20: 1. Ultrasound guidance for vascular access right femoral artery 2. Catheter placement into left common femoral artery from right femoral approach 3. Aortogram and selective left lower extremity angiogram 4.  Catheter directed thrombolytic therapy to the left SFA and popliteal arteries with 6 mg of TPA 5.  Mechanical thrombectomy with the penumbra CAT 7 device to the left SFA, popliteal artery, tibioperoneal trunk, and proximal peroneal artery             6.  Percutaneous transluminal angioplasty of the left peroneal artery and tibioperoneal trunk with 3 mm diameter by 30 cm length angioplasty balloon 7.  Percutaneous transluminal angioplasty of the entire left SFA and popliteal arteries with 2 inflations with a 5 mm diameter by 30 cm length Lutonix drug-coated angioplasty balloon             8.   Placement of 135 cm total length 50 cm working length catheter for continuous infusion of thrombolytic therapy             9.   Right femoral venous triple-lumen catheter placement with ultrasound and fluoroscopic guidance  04/20/20: 1.  Left lower extremity angiogram 2.  Mechanical thrombectomy to left SFA, popliteal artery, tibioperoneal trunk, and peroneal  artery with the penumbra cat 6 device 3.  Percutaneous transluminal angioplasty of left peroneal artery with 2.5 mm diameter angioplasty balloon end of the tibioperoneal trunk and distal popliteal artery with 4 mm diameter Lutonix drug-coated angioplasty balloon 4.  Viabahn stent placement of the popliteal artery with 6 mm diameter by 10 cm length stent 5.  Viabahn stent placement of the left SFA proximally with a 6 mm diameter by 10 cm length stent             6.  StarClose closure device right femoral artery  Discharged Condition: Good  HPI / Hospital Course:  Patient is a 54 y.o.male with acute on chronic ischemia of the left lower extremity likely precipitated by recent Covid infection. The patient has noninvasive study showing a critically reduced ABI of 0.09 with occlusion of the SFA. The patient is brought in for angiography for further evaluation and potential treatment.  Due to the limb threatening nature of the situation, angiogram was performed for attempted limb salvage. On 04/19/20, the patient underwent:  1. Ultrasound guidance for vascular access right femoral artery 2. Catheter placement into left common femoral artery from right femoral approach 3. Aortogram and selective left lower extremity angiogram 4.  Catheter directed thrombolytic therapy to the left SFA and popliteal arteries with 6 mg of TPA 5.  Mechanical thrombectomy with the penumbra CAT 7 device to the left SFA, popliteal artery, tibioperoneal trunk, and proximal peroneal artery             6.  Percutaneous transluminal angioplasty of the left peroneal artery and tibioperoneal trunk with 3 mm diameter by 30 cm length angioplasty  balloon 7.  Percutaneous transluminal angioplasty of the entire left SFA and popliteal arteries with 2 inflations with a 5 mm diameter by 30 cm length Lutonix drug-coated  angioplasty balloon             8.   Placement of 135 cm total length 50 cm working length catheter for continuous infusion of thrombolytic therapy             9.   Right femoral venous triple-lumen catheter placement with ultrasound and fluoroscopic guidance  The patient tolerated the procedure well was transferred to the ICU for observation overnight.  Patient was treated with TPA and heparin overnight without issue.  The patient was taken back to the angiography suite the following morning and underwent:  1.  Left lower extremity angiogram 2.  Mechanical thrombectomy to left SFA, popliteal artery, tibioperoneal trunk, and peroneal artery with the penumbra cat 6 device 3.  Percutaneous transluminal angioplasty of left peroneal artery with 2.5 mm diameter angioplasty balloon end of the tibioperoneal trunk and distal popliteal artery with 4 mm diameter Lutonix drug-coated angioplasty balloon 4.  Viabahn stent placement of the popliteal artery with 6 mm diameter by 10 cm length stent 5.  Viabahn stent placement of the left SFA proximally with a 6 mm diameter by 10 cm length stent             6.  StarClose closure device right femoral artery  Again, he tolerated the procedure well was transferred to the ICU for observation.  Patient was treated with Aggrastat overnight.  Patient was transitioned to aspirin and Eliquis the following day.  During the patient's stay, his diet was advanced, his Foley was removed, his pain was controlled with use of p.o. pain medication and he was ambulating at baseline.  On the day of discharge, the patient was afebrile with stable vital signs and improved physical exam.  Physical exam:  Alert and oriented x3, no acute distress Cardiovascular: Regular rate and rhythm Pulmonary: Clear to auscultation bilaterally Abdomen: Soft, nontender, nondistended Right groin:  Access site clean dry intact.  No  swelling or drainage Left lower extremity: Thigh soft.  Calf soft.  Tenderness from distally to toes.  Good capillary refill.  Minimal edema.  Motor/sensory is intact.  Labs: As below  Complications: None  Consults: None  Significant Diagnostic Studies: CBC Lab Results  Component Value Date   WBC 8.2 04/21/2020   HGB 9.4 (L) 04/21/2020   HCT 27.9 (L) 04/21/2020   MCV 84.3 04/21/2020   PLT 190 04/21/2020   BMET    Component Value Date/Time   NA 135 04/21/2020 0302   K 4.0 04/21/2020 0302   CL 102 04/21/2020 0302   CO2 26 04/21/2020 0302   GLUCOSE 216 (H) 04/21/2020 0302   BUN 28 (H) 04/21/2020 0302   CREATININE 1.12 04/21/2020 0302   CALCIUM 8.3 (L) 04/21/2020 0302   GFRNONAA >60 04/21/2020 0302   GFRAA >60 04/21/2020 0302   COAG Lab Results  Component Value Date   INR 1.0 05/30/2019   INR 1.0 04/07/2019   Disposition:  Discharge to :Home  Allergies as of 04/21/2020      Reactions   Penicillins Rash   .Has patient had a PCN reaction causing immediate rash, facial/tongue/throat swelling, SOB or lightheadedness with hypotension: Unknown Has patient had a PCN reaction causing severe rash involving mucus membranes or skin necrosis: Unknown Has patient had a PCN reaction that required hospitalization: Unknown Has patient had a  PCN reaction occurring within the last 10 years: Unknown If all of the above answers are "NO", then may proceed with Cephalosporin use.      Medication List    STOP taking these medications   clopidogrel 75 MG tablet Commonly known as: PLAVIX     TAKE these medications   acetaminophen 650 MG CR tablet Commonly known as: TYLENOL Take 650 mg by mouth every 8 (eight) hours as needed for pain.   amLODipine 10 MG tablet Commonly known as: NORVASC Take 1 tablet (10 mg total) by mouth daily.   apixaban 5 MG Tabs tablet Commonly known as: ELIQUIS Take 1 tablet (5 mg total) by mouth 2 (two) times daily.   aspirin EC 81 MG tablet Take 81  mg by mouth daily.   atorvastatin 40 MG tablet Commonly known as: LIPITOR Take 1 tablet (40 mg total) by mouth daily.   glipiZIDE 10 MG tablet Commonly known as: GLUCOTROL Take 10 mg by mouth daily before breakfast.   hydrochlorothiazide 25 MG tablet Commonly known as: HYDRODIURIL Take 25 mg by mouth daily.   lisinopril 40 MG tablet Commonly known as: ZESTRIL Take 40 mg by mouth daily.   oxyCODONE 5 MG immediate release tablet Commonly known as: Oxy IR/ROXICODONE Take 1 tablet (5 mg total) by mouth every 6 (six) hours as needed for moderate pain or severe pain.   Tradjenta 5 MG Tabs tablet Generic drug: linagliptin Take 5 mg by mouth daily.      Verbal and written Discharge instructions given to the patient. Wound care per Discharge AVS Follow-up Information    Dew, Marlow Baars, MD Follow up in 1 month(s).   Specialties: Vascular Surgery, Radiology, Interventional Cardiology Why: Can see Dew or Vivia Birmingham. Will need ABI. Contact information: [address] [place] [address] [phone]          Signed: Tonette Lederer, PA-C  04/22/2020, 1:57 PM

## 2020-05-03 NOTE — H&P (Signed)
See Sheppard Plumber note from 04/16/2020 for full details  The patient was interviewed and re-examined.  The patient's previous History and Physical has been reviewed and is unchanged.  There is no change in the plan of care. We plan to proceed with the scheduled procedure.  Festus Barren, MD  05/03/2020, 4:15 PM

## 2020-05-14 DIAGNOSIS — E118 Type 2 diabetes mellitus with unspecified complications: Secondary | ICD-10-CM

## 2020-06-21 ENCOUNTER — Other Ambulatory Visit: Payer: Self-pay

## 2020-06-21 ENCOUNTER — Telehealth (INDEPENDENT_AMBULATORY_CARE_PROVIDER_SITE_OTHER): Payer: Self-pay

## 2020-06-21 ENCOUNTER — Inpatient Hospital Stay
Admission: EM | Admit: 2020-06-21 | Discharge: 2020-06-26 | DRG: 475 | Disposition: A | Discharge status: Home Health | Payer: Medicaid Other | Attending: Internal Medicine | Admitting: Internal Medicine

## 2020-06-21 DIAGNOSIS — L89152 Pressure ulcer of sacral region, stage 2: Secondary | ICD-10-CM | POA: Diagnosis present

## 2020-06-21 DIAGNOSIS — E785 Hyperlipidemia, unspecified: Secondary | ICD-10-CM | POA: Diagnosis not present

## 2020-06-21 DIAGNOSIS — E118 Type 2 diabetes mellitus with unspecified complications: Secondary | ICD-10-CM | POA: Diagnosis not present

## 2020-06-21 DIAGNOSIS — Z5189 Encounter for other specified aftercare: Secondary | ICD-10-CM

## 2020-06-21 DIAGNOSIS — Z7982 Long term (current) use of aspirin: Secondary | ICD-10-CM | POA: Diagnosis not present

## 2020-06-21 DIAGNOSIS — E43 Unspecified severe protein-calorie malnutrition: Secondary | ICD-10-CM | POA: Insufficient documentation

## 2020-06-21 DIAGNOSIS — Z8249 Family history of ischemic heart disease and other diseases of the circulatory system: Secondary | ICD-10-CM

## 2020-06-21 DIAGNOSIS — E860 Dehydration: Secondary | ICD-10-CM | POA: Diagnosis present

## 2020-06-21 DIAGNOSIS — Z79899 Other long term (current) drug therapy: Secondary | ICD-10-CM | POA: Diagnosis not present

## 2020-06-21 DIAGNOSIS — E1165 Type 2 diabetes mellitus with hyperglycemia: Secondary | ICD-10-CM | POA: Diagnosis present

## 2020-06-21 DIAGNOSIS — Z83438 Family history of other disorder of lipoprotein metabolism and other lipidemia: Secondary | ICD-10-CM

## 2020-06-21 DIAGNOSIS — Z7901 Long term (current) use of anticoagulants: Secondary | ICD-10-CM | POA: Diagnosis not present

## 2020-06-21 DIAGNOSIS — Z20822 Contact with and (suspected) exposure to covid-19: Secondary | ICD-10-CM | POA: Diagnosis present

## 2020-06-21 DIAGNOSIS — D509 Iron deficiency anemia, unspecified: Secondary | ICD-10-CM | POA: Diagnosis present

## 2020-06-21 DIAGNOSIS — Z880 Allergy status to penicillin: Secondary | ICD-10-CM | POA: Diagnosis not present

## 2020-06-21 DIAGNOSIS — I10 Essential (primary) hypertension: Secondary | ICD-10-CM | POA: Diagnosis present

## 2020-06-21 DIAGNOSIS — G8918 Other acute postprocedural pain: Secondary | ICD-10-CM

## 2020-06-21 DIAGNOSIS — N179 Acute kidney failure, unspecified: Secondary | ICD-10-CM | POA: Diagnosis present

## 2020-06-21 DIAGNOSIS — T502X5A Adverse effect of carbonic-anhydrase inhibitors, benzothiadiazides and other diuretics, initial encounter: Secondary | ICD-10-CM | POA: Diagnosis present

## 2020-06-21 DIAGNOSIS — T879 Unspecified complications of amputation stump: Secondary | ICD-10-CM | POA: Diagnosis not present

## 2020-06-21 DIAGNOSIS — E871 Hypo-osmolality and hyponatremia: Secondary | ICD-10-CM | POA: Diagnosis present

## 2020-06-21 DIAGNOSIS — T8744 Infection of amputation stump, left lower extremity: Secondary | ICD-10-CM | POA: Diagnosis present

## 2020-06-21 DIAGNOSIS — T8140XA Infection following a procedure, unspecified, initial encounter: Secondary | ICD-10-CM

## 2020-06-21 DIAGNOSIS — E1159 Type 2 diabetes mellitus with other circulatory complications: Secondary | ICD-10-CM | POA: Diagnosis present

## 2020-06-21 DIAGNOSIS — E1139 Type 2 diabetes mellitus with other diabetic ophthalmic complication: Secondary | ICD-10-CM | POA: Diagnosis present

## 2020-06-21 DIAGNOSIS — Z7984 Long term (current) use of oral hypoglycemic drugs: Secondary | ICD-10-CM | POA: Diagnosis not present

## 2020-06-21 DIAGNOSIS — L8990 Pressure ulcer of unspecified site, unspecified stage: Secondary | ICD-10-CM | POA: Insufficient documentation

## 2020-06-21 DIAGNOSIS — I739 Peripheral vascular disease, unspecified: Secondary | ICD-10-CM | POA: Diagnosis not present

## 2020-06-21 DIAGNOSIS — I96 Gangrene, not elsewhere classified: Secondary | ICD-10-CM | POA: Diagnosis not present

## 2020-06-21 LAB — BASIC METABOLIC PANEL
Anion gap: 7 (ref 5–15)
Anion gap: 10 (ref 5–15)
BUN: 69 mg/dL — ABNORMAL HIGH (ref 6–20)
BUN: 64 mg/dL — ABNORMAL HIGH (ref 6–20)
CO2: 28 mmol/L (ref 22–32)
CO2: 28 mmol/L (ref 22–32)
Calcium: 9.2 mg/dL (ref 8.9–10.3)
Calcium: 9.5 mg/dL (ref 8.9–10.3)
Chloride: 93 mmol/L — ABNORMAL LOW (ref 98–111)
Chloride: 94 mmol/L — ABNORMAL LOW (ref 98–111)
Creatinine, Ser: 1.59 mg/dL — ABNORMAL HIGH (ref 0.61–1.24)
Creatinine, Ser: 1.17 mg/dL (ref 0.61–1.24)
GFR calc Af Amer: 56 mL/min — ABNORMAL LOW (ref >60)
GFR calc Af Amer: >60 mL/min (ref >60)
GFR calc non Af Amer: 48 mL/min — ABNORMAL LOW (ref >60)
GFR calc non Af Amer: >60 mL/min (ref >60)
Glucose, Bld: 292 mg/dL — ABNORMAL HIGH (ref 70–99)
Glucose, Bld: 241 mg/dL — ABNORMAL HIGH (ref 70–99)
Potassium: 4.2 mmol/L (ref 3.5–5.1)
Potassium: 4.5 mmol/L (ref 3.5–5.1)
Sodium: 128 mmol/L — ABNORMAL LOW (ref 135–145)
Sodium: 132 mmol/L — ABNORMAL LOW (ref 135–145)

## 2020-06-21 LAB — SEDIMENTATION RATE: Sed Rate: 126 mm/hr — ABNORMAL HIGH (ref 0–20)

## 2020-06-21 LAB — GLUCOSE, CAPILLARY: Glucose-Capillary: 237 mg/dL — ABNORMAL HIGH (ref 70–99)

## 2020-06-21 LAB — CBC
HCT: 22.8 % — ABNORMAL LOW (ref 39.0–52.0)
Hemoglobin: 8 g/dL — ABNORMAL LOW (ref 13.0–17.0)
MCH: 27.7 pg (ref 26.0–34.0)
MCHC: 35.1 g/dL (ref 30.0–36.0)
MCV: 78.9 fL — ABNORMAL LOW (ref 80.0–100.0)
Platelets: 409 10*3/uL — ABNORMAL HIGH (ref 150–400)
RBC: 2.89 MIL/uL — ABNORMAL LOW (ref 4.22–5.81)
RDW: 13.2 % (ref 11.5–15.5)
WBC: 11.7 10*3/uL — ABNORMAL HIGH (ref 4.0–10.5)
nRBC: 0 % (ref 0.0–0.2)

## 2020-06-21 LAB — SARS CORONAVIRUS 2 BY RT PCR (HOSPITAL ORDER, PERFORMED IN ~~LOC~~ HOSPITAL LAB): SARS Coronavirus 2: NEGATIVE

## 2020-06-21 MED ORDER — SODIUM CHLORIDE 0.9 % IV SOLN: INTRAVENOUS | Status: DC

## 2020-06-21 MED ORDER — ONDANSETRON HCL 4 MG/2ML IJ SOLN
4.0000 mg | Freq: Four times a day (QID) | INTRAMUSCULAR | Status: DC | PRN
  Filled 2020-06-21: qty 2

## 2020-06-21 MED ORDER — LISINOPRIL 20 MG PO TABS
40.0000 mg | ORAL_TABLET | Freq: Every day | ORAL | Status: DC
  Administered 2020-06-21 – 2020-06-26 (×6): 40 mg via ORAL
  Filled 2020-06-21: qty 4
  Filled 2020-06-21 (×3): qty 2
  Filled 2020-06-21 (×2): qty 4

## 2020-06-21 MED ORDER — INSULIN ASPART 100 UNIT/ML ~~LOC~~ SOLN
0.0000 [IU] | Freq: Three times a day (TID) | SUBCUTANEOUS | Status: DC
  Administered 2020-06-22: 3 [IU] via SUBCUTANEOUS
  Administered 2020-06-22: 5 [IU] via SUBCUTANEOUS
  Administered 2020-06-22 – 2020-06-23 (×3): 3 [IU] via SUBCUTANEOUS
  Administered 2020-06-23 – 2020-06-24 (×2): 5 [IU] via SUBCUTANEOUS
  Administered 2020-06-24: 2 [IU] via SUBCUTANEOUS
  Administered 2020-06-24: 3 [IU] via SUBCUTANEOUS
  Administered 2020-06-25 (×2): 2 [IU] via SUBCUTANEOUS
  Administered 2020-06-25 – 2020-06-26 (×3): 3 [IU] via SUBCUTANEOUS
  Filled 2020-06-21 (×14): qty 1

## 2020-06-21 MED ORDER — VANCOMYCIN HCL 2000 MG/400ML IV SOLN
2000.0000 mg | Freq: Once | INTRAVENOUS | Status: AC
  Administered 2020-06-21: 2000 mg via INTRAVENOUS
  Filled 2020-06-21 (×2): qty 400

## 2020-06-21 MED ORDER — ONDANSETRON HCL 4 MG PO TABS: 4.0000 mg | ORAL_TABLET | Freq: Four times a day (QID) | ORAL | Status: DC | PRN

## 2020-06-21 MED ORDER — MORPHINE SULFATE (PF) 2 MG/ML IV SOLN
2.0000 mg | Freq: Once | INTRAVENOUS | Status: AC
  Administered 2020-06-21: 2 mg via INTRAVENOUS
  Filled 2020-06-21: qty 1

## 2020-06-21 MED ORDER — ACETAMINOPHEN 325 MG PO TABS
650.0000 mg | ORAL_TABLET | Freq: Four times a day (QID) | ORAL | Status: DC | PRN
  Administered 2020-06-22: 650 mg via ORAL
  Filled 2020-06-21: qty 2

## 2020-06-21 MED ORDER — SODIUM CHLORIDE 0.9 % IV SOLN
2.0000 g | Freq: Three times a day (TID) | INTRAVENOUS | Status: DC
  Administered 2020-06-21 – 2020-06-22 (×2): 2 g via INTRAVENOUS
  Filled 2020-06-21 (×3): qty 2

## 2020-06-21 MED ORDER — VANCOMYCIN HCL 750 MG/150ML IV SOLN
750.0000 mg | Freq: Two times a day (BID) | INTRAVENOUS | Status: DC
  Administered 2020-06-22 (×2): 750 mg via INTRAVENOUS
  Filled 2020-06-21 (×4): qty 150

## 2020-06-21 MED ORDER — ASPIRIN EC 81 MG PO TBEC
81.0000 mg | DELAYED_RELEASE_TABLET | Freq: Every day | ORAL | Status: DC
  Administered 2020-06-22 – 2020-06-26 (×5): 81 mg via ORAL
  Filled 2020-06-21 (×5): qty 1

## 2020-06-21 MED ORDER — ENOXAPARIN SODIUM 40 MG/0.4ML ~~LOC~~ SOLN
40.0000 mg | SUBCUTANEOUS | Status: DC
  Administered 2020-06-21: 40 mg via SUBCUTANEOUS
  Filled 2020-06-21: qty 0.4

## 2020-06-21 MED ORDER — VANCOMYCIN HCL IN DEXTROSE 1-5 GM/200ML-% IV SOLN
1000.0000 mg | INTRAVENOUS | Status: DC
  Filled 2020-06-21: qty 200

## 2020-06-21 MED ORDER — OXYCODONE HCL 5 MG PO TABS
5.0000 mg | ORAL_TABLET | Freq: Four times a day (QID) | ORAL | Status: DC | PRN
  Administered 2020-06-21 – 2020-06-22 (×2): 5 mg via ORAL
  Filled 2020-06-21 (×3): qty 1

## 2020-06-21 MED ORDER — ATORVASTATIN CALCIUM 20 MG PO TABS
40.0000 mg | ORAL_TABLET | Freq: Every day | ORAL | Status: DC
  Administered 2020-06-21 – 2020-06-26 (×6): 40 mg via ORAL
  Filled 2020-06-21 (×6): qty 2

## 2020-06-21 MED ORDER — MORPHINE SULFATE (PF) 4 MG/ML IV SOLN: 4.0000 mg | Freq: Once | INTRAVENOUS | Status: DC

## 2020-06-21 MED ORDER — AMLODIPINE BESYLATE 10 MG PO TABS
10.0000 mg | ORAL_TABLET | Freq: Every day | ORAL | Status: DC
  Administered 2020-06-22 – 2020-06-26 (×5): 10 mg via ORAL
  Filled 2020-06-21: qty 1
  Filled 2020-06-21 (×2): qty 2
  Filled 2020-06-21 (×2): qty 1

## 2020-06-21 MED ORDER — HYDROCHLOROTHIAZIDE 25 MG PO TABS: 25.0000 mg | ORAL_TABLET | Freq: Every day | ORAL | Status: DC

## 2020-06-21 MED ORDER — SODIUM CHLORIDE 0.9 % IV BOLUS
500.0000 mL | Freq: Once | INTRAVENOUS | Status: AC
  Administered 2020-06-21: 500 mL via INTRAVENOUS

## 2020-06-21 NOTE — TOC Progression Note (Addendum)
Transition of Care [facility]) - Progression Note    Patient Details  Name: Lean Fayson MRN: [id number] Date of Birth: 12/31/1965  Transition of Care [facility]) CM/SW Contact  Ivanhoe Cellar, RN Phone Number: 06/21/2020, 3:09 PM  Clinical Narrative:    Discussed case with Cassie @ [facility] for [facility] at discharge. Patient will need physical therapy and nursing.   Updated by Wilford Sports @ [facility] that they can't offer services due to limited capacity. States she has also checked with [facility] and [facility] and they have no availability as well.    Expected Discharge Plan: Home w Home Health Services Barriers to Discharge: Continued Medical Work up  Expected Discharge Plan and Services Expected Discharge Plan: Home w Home Health Services       Living arrangements for the past 2 months: Single Family Home                                       Social Determinants of Health (SDOH) Interventions    Readmission Risk Interventions No flowsheet data found.

## 2020-06-21 NOTE — ED Triage Notes (Signed)
Pt to ED via [facility] from home. 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 BKA perfomred x3 wks ago at [facility]. Per EMS it has been poor communication from [facility] on [address] since discharge.   Pt stating BKA site has appeared black and has had drainage since surgery. Pt with stage 1 pressure injury on sacrum.

## 2020-06-21 NOTE — Telephone Encounter (Signed)
Patient was made aware with medical advice and verbalized understanding. The patient informed that he was at [facility] at the time of call to have his wound check and will try to reach back out to [facility]

## 2020-06-21 NOTE — H&P (Signed)
History and Physical    Jeremy Padilla GDJ:[id number] DOB: 1966-10-14 DOA: 06/21/2020  PCP: Toy Cookey, FNP   Patient coming from: Home  I have personally briefly reviewed patient's old medical records in [facility]  Chief Complaint: Pain in left BKA stump  HPI: Jeremy Padilla is a 54 y.o. male with medical history significant for diabetes mellitus, peripheral vascular disease status post recent left BKA which is done [facility], history of hypertension who presents to the emergency room for evaluation of pain in his left BKA stump.  Patient was discharged home from the hospital with home health but reports that they only came out to his house twice and since then he has not had any care at home.  He states that his cousin has been coming to do local wound care and to provide him with food but patient has been in bed for the last 2 weeks postop and has not developed a pressure ulcer on his buttock.  He also has increased drainage, pain and discoloration of the skin over his left BKA stump.  He denies having any fever or chills, no nausea, no vomiting, no abdominal pain, no chest pain, no shortness of breath.  Patient reports that he has run out of pain medication.   Sodium of 128, serum glucose of 292, BUN of 16 insulin creatinine of 1.5, white count of 11.7 and hemoglobin of 8g/dl    ED Course: Patient is a 54 year old male who is status post recent left BKA incision and presents to the emergency room for evaluation of complications following his surgery.  Patient denies increased pain, drainage and skin discoloration involving the left BKA stump.  He has no fever or chills.  Patient has been seen by vascular surgery who plans a revision of the left BKA.  He will be admitted to the hospital for further evaluation.  Review of Systems: As per HPI otherwise 10 point review of systems negative.    Past Medical History:  Diagnosis Date  . Abscess 10/05/15  . Diabetes  mellitus without complication (HCC)   . 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 Hyperlipidemia   . Hypertension     Past Surgical History:  Procedure Laterality Date  . LOWER EXTREMITY ANGIOGRAPHY Left 04/09/2019   Procedure: LOWER EXTREMITY ANGIOGRAPHY;  Surgeon: Annice Needy, MD;  Location: [facility] INVASIVE CV LAB;  Service: Cardiovascular;  Laterality: Left;  . LOWER EXTREMITY ANGIOGRAPHY Left 04/19/2020   Procedure: Lower Extremity Angiography;  Surgeon: Annice Needy, MD;  Location: [facility] INVASIVE CV LAB;  Service: Cardiovascular;  Laterality: Left;  . LOWER EXTREMITY ANGIOGRAPHY Left 04/20/2020   Procedure: Lower Extremity Angiography;  Surgeon: Annice Needy, MD;  Location: [facility] INVASIVE CV LAB;  Service: Cardiovascular;  Laterality: Left;  . LOWER EXTREMITY INTERVENTION N/A 05/30/2019   Procedure: LOWER EXTREMITY INTERVENTION;  Surgeon: Renford Dills, MD;  Location: [facility] INVASIVE CV LAB;  Service: Cardiovascular;  Laterality: N/A;  . WRIST SURGERY Left      reports that he has never smoked. He has never used smokeless tobacco. He reports that he does not drink alcohol and does not use drugs.  Allergies  Allergen Reactions  . Penicillins Rash    .Has patient had a PCN reaction causing immediate rash, facial/tongue/throat swelling, SOB or lightheadedness with hypotension: Unknown Has patient had a PCN reaction causing severe rash involving mucus membranes or skin necrosis: Unknown Has patient had a PCN reaction that required hospitalization: Unknown Has patient had a  PCN reaction occurring within the last 10 years: Unknown If all of the above answers are "NO", then may proceed with Cephalosporin use.     Family History  Problem Relation Age of Onset  . Heart disease Father   . Hyperlipidemia Father   . Hypertension Father      Prior to Admission medications   Medication Sig Start Date End Date Taking? Authorizing Provider  amLODipine (NORVASC) 10 MG tablet Take 1 tablet (10 mg total) by mouth  daily. 08/03/17  Yes Gollan, Tollie Pizza, MD  apixaban (ELIQUIS) 5 MG TABS tablet Take 1 tablet (5 mg total) by mouth 2 (two) times daily. 04/21/20  Yes Stegmayer, Ranae Plumber, PA-C  aspirin EC 81 MG tablet Take 81 mg by mouth daily.   Yes [provider]  gabapentin (NEURONTIN) 300 MG capsule Take by mouth. 06/05/20 07/05/20 Yes [provider]  hydrochlorothiazide (HYDRODIURIL) 25 MG tablet Take 25 mg by mouth daily.   Yes [provider]  acetaminophen (TYLENOL) 650 MG CR tablet Take 650 mg by mouth every 8 (eight) hours as needed for pain.    [provider]  atorvastatin (LIPITOR) 40 MG tablet Take 1 tablet (40 mg total) by mouth daily. 08/03/17   Antonieta Iba, MD  glipiZIDE (GLUCOTROL) 10 MG tablet Take 10 mg by mouth daily before breakfast.    [provider]  linagliptin (TRADJENTA) 5 MG TABS tablet Take 5 mg by mouth daily. 04/07/19   [provider]  lisinopril (ZESTRIL) 40 MG tablet Take 40 mg by mouth daily.    [provider]  oxyCODONE (OXY IR/ROXICODONE) 5 MG immediate release tablet Take 1 tablet (5 mg total) by mouth every 6 (six) hours as needed for moderate pain or severe pain. 04/21/20   Stegmayer, Ranae Plumber, PA-C    Physical Exam: Vitals:   06/21/20 1339 06/21/20 1342  BP: (!) 145/71   Pulse: 99   Resp: 18   Temp: 98.3 F (36.8 C)   TempSrc: Oral   SpO2: 100%   Weight:  81.6 kg  Height:  6\' 2"  (1.88 m)     Vitals:   06/21/20 1339 06/21/20 1342  BP: (!) 145/71   Pulse: 99   Resp: 18   Temp: 98.3 F (36.8 C)   TempSrc: Oral   SpO2: 100%   Weight:  81.6 kg  Height:  6\' 2"  (1.88 m)    Constitutional: NAD, alert and oriented x 3 Eyes: PERRL, lids and conjunctivae normal ENMT: Mucous membranes are dry Neck: normal, supple, no masses, no thyromegaly Respiratory: clear to auscultation bilaterally, no wheezing, no crackles. Normal respiratory effort. No accessory muscle use. Cardiovascular: Regular  rate and rhythm, no murmurs / rubs / gallops. No extremity edema. 2+ pedal pulses. No carotid bruits.  Abdomen: no tenderness, no masses palpated. No hepatosplenomegaly. Bowel sounds positive.  Musculoskeletal: no clubbing / cyanosis.  Necrosis of skin over left BKA stump, staples in place, purulent drainage Skin: no rashes, lesions, ulcers.  Neurologic: No gross focal neurologic deficit. Psychiatric: Normal mood and affect.   Labs on Admission: I have personally reviewed following labs and imaging studies  CBC: Recent Labs  Lab 06/21/20 1422  WBC 11.7*  HGB 8.0*  HCT 22.8*  MCV 78.9*  PLT 409*   Basic Metabolic Panel: Recent Labs  Lab 06/21/20 1422  NA 128*  K 4.2  CL 93*  CO2 28  GLUCOSE 292*  BUN 69*  CREATININE 1.59*  CALCIUM 9.2  GFR: Estimated Creatinine Clearance: 60.6 mL/min (A) (by C-G formula based on SCr of 1.59 mg/dL (H)). Liver Function Tests: No results for input(s): AST, ALT, ALKPHOS, BILITOT, PROT, ALBUMIN in the last 168 hours. No results for input(s): LIPASE, AMYLASE in the last 168 hours. No results for input(s): AMMONIA in the last 168 hours. Coagulation Profile: No results for input(s): INR, PROTIME in the last 168 hours. Cardiac Enzymes: No results for input(s): CKTOTAL, CKMB, CKMBINDEX, TROPONINI in the last 168 hours. BNP (last 3 results) No results for input(s): PROBNP in the last 8760 hours. HbA1C: No results for input(s): HGBA1C in the last 72 hours. CBG: No results for input(s): GLUCAP in the last 168 hours. Lipid Profile: No results for input(s): CHOL, HDL, LDLCALC, TRIG, CHOLHDL, LDLDIRECT in the last 72 hours. Thyroid Function Tests: No results for input(s): TSH, T4TOTAL, FREET4, T3FREE, THYROIDAB in the last 72 hours. Anemia Panel: No results for input(s): VITAMINB12, FOLATE, FERRITIN, TIBC, IRON, RETICCTPCT in the last 72 hours. Urine analysis:    Component Value Date/Time   COLORURINE YELLOW (A) 03/27/2020 2159    APPEARANCEUR CLEAR (A) 03/27/2020 2159   LABSPEC 1.021 03/27/2020 2159   PHURINE 5.0 03/27/2020 2159   GLUCOSEU >=500 (A) 03/27/2020 2159   HGBUR NEGATIVE 03/27/2020 2159   BILIRUBINUR NEGATIVE 03/27/2020 2159   KETONESUR 20 (A) 03/27/2020 2159   PROTEINUR NEGATIVE 03/27/2020 2159   NITRITE NEGATIVE 03/27/2020 2159   LEUKOCYTESUR NEGATIVE 03/27/2020 2159    Radiological Exams on Admission: No results found.  EKG: Independently reviewed.   Assessment/Plan Principal Problem:   BKA stump complication (HCC) Active Problems:   Hypertension goal BP (blood pressure) < 140/90   Type 2 diabetes mellitus with complication, without long-term current use of insulin (HCC)   PAD (peripheral artery disease) (HCC)   Hyponatremia   Dehydration     BKA stump complication Patient is status post left BKA for severe peripheral arterial disease and has complications following surgery which include surrounding cellulitis with nonviable tissue Vascular surgery has been consulted We will place patient empirically on vancomycin and aztreonam since he is allergic to penicillin Follow-up results of wound and blood cultures    Hypertension Blood pressure stable Continue amlodipine and lisinopril   Diabetes mellitus Hold oral hypoglycemic agents Maintain consistent carbohydrate diet Glycemic control with sliding scale insulin   Hyponatremia Most likely secondary to hydrochlorothiazide use and volume depletion from dehydration Hold hydrochlorothiazide for now IV fluid resuscitation Repeat sodium levels in am   Dehydration From poor oral intake Patient has a baseline serum creatinine of 1.12 but today on admission it is 1.59 We will hydrate patient and repeat renal parameters in a.m.   DVT prophylaxis: Lovenox Code Status: Full code Family Communication: Greater than 50% of time was spent discussing plan of care with patient at the bedside.  All questions and concerns have been  addressed.  He verbalizes understanding and agrees with the plan. Disposition Plan: Back to previous home environment Consults called: Vascular surgery    Ibtisam Benge MD [facility]     06/21/2020, 5:19 PM

## 2020-06-21 NOTE — Telephone Encounter (Signed)
Unfortunately because the patient had his amputation done at [facility], all care will have to be directed by [facility].  This is due to the fact that they are responsible for the wound's creation.  I would suggest they contact [facility] again or they can try to contact their PCP

## 2020-06-21 NOTE — ED Provider Notes (Signed)
[facility] Emergency Department Provider Note ____________________________________________   First MD Initiated Contact with Patient 06/21/20 1350     (approximate)  I have reviewed the triage vital signs and the nursing notes.  HISTORY  Chief Complaint Post-op Problem (BKA)  HPI Jeremy Padilla is a 54 y.o. male here for evaluation of pain in his left leg.  Patient reports 3 weeks ago he had amputation of his left lower leg, he is seeing vascular surgery here many times with attempts to get better blood flow in it.  Since leaving the hospital at [facility] he has not been having any in-home care, they came out to his house once but reports due to insurance he was able to continue.  His sister is been providing him food and wound care, but he reports has been laying having pain in his left leg site for at least the last 2 weeks and started develop a pressure sore on his buttock  No fevers or chills.  No nausea or vomiting.  Reports pain to left lower leg, was on prescription pain medicine for a week but ran out    Past Medical History:  Diagnosis Date  . Abscess 10/05/15  . Diabetes mellitus without complication (HCC)   . 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 Hyperlipidemia   . Hypertension     Patient Active Problem List   Diagnosis Date Noted  . Ischemic leg 04/20/2020  . Ischemia 04/16/2020  . Ischemia of left lower extremity 05/30/2019  . Atherosclerosis of native arteries of extremity with rest pain (HCC) 04/08/2019  . Lacunar infarction (HCC) 08/03/2017  . Bruit of left carotid artery 08/03/2017  . Type 2 diabetes mellitus with complication, without long-term current use of insulin (HCC) 08/02/2017  . Abscess or cellulitis, neck 10/08/2015  . Hyperglycemia 10/08/2015  . Hypertension goal BP (blood pressure) < 140/90 10/08/2015  . Heart murmur 10/08/2015    Past Surgical History:  Procedure Laterality Date  . LOWER EXTREMITY ANGIOGRAPHY Left 04/09/2019    Procedure: LOWER EXTREMITY ANGIOGRAPHY;  Surgeon: Annice Needy, MD;  Location: [facility] INVASIVE CV LAB;  Service: Cardiovascular;  Laterality: Left;  . LOWER EXTREMITY ANGIOGRAPHY Left 04/19/2020   Procedure: Lower Extremity Angiography;  Surgeon: Annice Needy, MD;  Location: [facility] INVASIVE CV LAB;  Service: Cardiovascular;  Laterality: Left;  . LOWER EXTREMITY ANGIOGRAPHY Left 04/20/2020   Procedure: Lower Extremity Angiography;  Surgeon: Annice Needy, MD;  Location: [facility] INVASIVE CV LAB;  Service: Cardiovascular;  Laterality: Left;  . LOWER EXTREMITY INTERVENTION N/A 05/30/2019   Procedure: LOWER EXTREMITY INTERVENTION;  Surgeon: Renford Dills, MD;  Location: [facility] INVASIVE CV LAB;  Service: Cardiovascular;  Laterality: N/A;  . WRIST SURGERY Left     Prior to Admission medications   Medication Sig Start Date End Date Taking? Authorizing Provider  acetaminophen (TYLENOL) 650 MG CR tablet Take 650 mg by mouth every 8 (eight) hours as needed for pain.    [provider]  amLODipine (NORVASC) 10 MG tablet Take 1 tablet (10 mg total) by mouth daily. 08/03/17   Antonieta Iba, MD  apixaban (ELIQUIS) 5 MG TABS tablet Take 1 tablet (5 mg total) by mouth 2 (two) times daily. 04/21/20   Stegmayer, Ranae Plumber, PA-C  aspirin EC 81 MG tablet Take 81 mg by mouth daily.    [provider]  atorvastatin (LIPITOR) 40 MG tablet Take 1 tablet (40 mg total) by mouth daily. 08/03/17   Antonieta Iba, MD  glipiZIDE (GLUCOTROL) 10 MG tablet Take 10 mg by mouth daily before breakfast.    [provider]  hydrochlorothiazide (HYDRODIURIL) 25 MG tablet Take 25 mg by mouth daily.    [provider]  linagliptin (TRADJENTA) 5 MG TABS tablet Take 5 mg by mouth daily. 04/07/19   [provider]  lisinopril (ZESTRIL) 40 MG tablet Take 40 mg by mouth daily.    [provider]  oxyCODONE (OXY IR/ROXICODONE) 5 MG immediate release tablet Take 1 tablet (5 mg total) by mouth  every 6 (six) hours as needed for moderate pain or severe pain. 04/21/20   Stegmayer, Ranae Plumber, PA-C    Allergies Penicillins  Family History  Problem Relation Age of Onset  . Heart disease Father   . Hyperlipidemia Father   . Hypertension Father     Social History Social History   Tobacco Use  . Smoking status: Never Smoker  . Smokeless tobacco: Never Used  Vaping Use  . Vaping Use: Never used  Substance Use Topics  . Alcohol use: No  . Drug use: No    Review of Systems Constitutional: No fever/chills Eyes: No visual changes. ENT: No sore throat.  Feels dehydrated not eating too well not getting much food Cardiovascular: Denies chest pain. Respiratory: Denies shortness of breath. Gastrointestinal: No abdominal pain.   Genitourinary: Negative for dysuria. Musculoskeletal: Negative for back pain.  Pain left lower leg stump site. Skin: Negative for rash. Neurological: Negative for headaches, areas of focal weakness or numbness.    ____________________________________________   PHYSICAL EXAM:  VITAL SIGNS: ED Triage Vitals  Enc Vitals Group     BP 06/21/20 1339 (!) 145/71     Pulse Rate 06/21/20 1339 99     Resp 06/21/20 1339 18     Temp 06/21/20 1339 98.3 F (36.8 C)     Temp Source 06/21/20 1339 Oral     SpO2 06/21/20 1339 100 %     Weight 06/21/20 1342 180 lb (81.6 kg)     Height 06/21/20 1342 6\' 2"  (1.88 m)     Head Circumference --      Peak Flow --      Pain Score 06/21/20 1342 5     Pain Loc --      Pain Edu? --      Excl. in GC? --     Constitutional: Alert and oriented. Well appearing and in no acute distress. Eyes: Conjunctivae are normal. Head: Atraumatic. Nose: No congestion/rhinnorhea. Mouth/Throat: Mucous membranes are moist. Neck: No stridor.  Cardiovascular: Normal rate, regular rhythm. Grossly normal heart sounds.  Good peripheral circulation. Respiratory: Normal respiratory effort.  No retractions. Lungs CTAB. Gastrointestinal:  Soft and nontender. No distention. Musculoskeletal: Right lower extremity no acute findings.  Left lower extremity patient reports pain at stump site, there is black appearance to the tissue at the distal BKA site.  Some slight oozing and drainage is serosanguineous appearing fluid, possibly some slight purulence Neurologic:  Normal speech and language. No gross focal neurologic deficits are appreciated.  Skin:  Skin is warm, dry and intact. No rash noted except as discussed left lower leg. Psychiatric: Mood and affect are normal. Speech and behavior are normal.  ____________________________________________   LABS (all labs ordered are listed, but only abnormal results are displayed)  Labs Reviewed  CBC - Abnormal; Notable for the following components:      Result Value   WBC 11.7 (*)    RBC 2.89 (*)  Hemoglobin 8.0 (*)    HCT 22.8 (*)    MCV 78.9 (*)    Platelets 409 (*)    All other components within normal limits  BASIC METABOLIC PANEL - Abnormal; Notable for the following components:   Sodium 128 (*)    Chloride 93 (*)    Glucose, Bld 292 (*)    BUN 69 (*)    Creatinine, Ser 1.59 (*)    GFR calc non Af Amer 48 (*)    GFR calc Af Amer 56 (*)    All other components within normal limits  CULTURE, BLOOD (ROUTINE X 2)  CULTURE, BLOOD (ROUTINE X 2)   ____________________________________________  EKG   ____________________________________________  RADIOLOGY   ____________________________________________   PROCEDURES  Procedure(s) performed: None  Procedures  Critical Care performed: No  ____________________________________________   INITIAL IMPRESSION / ASSESSMENT AND PLAN / ED COURSE  Pertinent labs & imaging results that were available during my care of the patient were reviewed by me and considered in my medical decision making (see chart for details).   Patient ongoing pain now having drainage and black tissue over his BKA site on the left leg.   Previous concern for infection, also developing some slight erythema and early skin breakdown over the sacrum indicative of developing decubitus ulcer.  Patient seen by vascular surgery, they have ordered vancomycin and recommend admission to the hospitalist service with vascular surgery to follow and possibly perform surgery.   Jeremy Padilla was evaluated in Emergency Department on 06/21/2020 for the symptoms described in the history of present illness. He was evaluated in the context of the global COVID-19 pandemic, which necessitated consideration that the patient might be at risk for infection with the SARS-CoV-2 virus that causes COVID-19. Institutional protocols and algorithms that pertain to the evaluation of patients at risk for COVID-19 are in a state of rapid change based on information released by regulatory bodies including the CDC and federal and state organizations. These policies and algorithms were followed during the patient's care in the ED.  Patient has positive PCR test for Covid 2 months ago.  Currently asymptomatic    Admission discussed with Dr. Joylene Igo, vascular surgery has seen the patient and will continue to follow (Dr. Wyn Quaker and Britt Bolognese)  ____________________________________________   FINAL CLINICAL IMPRESSION(S) / ED DIAGNOSES  Final diagnoses:  Post-operative pain  Visit for wound check  Postoperative infection, unspecified type, initial encounter        Note:  This document was prepared using Dragon voice recognition software and may include unintentional dictation errors       Sharyn Creamer, MD 06/21/20 1600

## 2020-06-21 NOTE — TOC Initial Note (Signed)
Transition of Care [facility]) - Initial/Assessment Note    Patient Details  Name: Jeremy Padilla MRN: [id number] Date of Birth: Jan 11, 1966  [facility] CM/SW Contact:    Marlton Cellar, RN Phone Number: 06/21/2020, 2:58 PM  Clinical Narrative:                 Spoke with daughter on phone. Daughter states patient was sent home alone with no follow up for pain control. States he had a nurse come out 2 times but they did not do any dressing changes or therapy. Daughter states patient has had terrible pain and has not left the chair since coming home. Sugar has been over 300 per daughter. Patient has been disabled for the past year due to the blood clots prior to amputation and has pending Medicaid and disability (SSI). Daughter lives 3 miles away and assists as able along with his son. Patient also has a sister who is helping to make sure he has food. Patients cousin assisted patient to complete all paperwork for Medicaid and SSI and states it has all been submitted but they are waiting. RN CM discussed potential for charity home health but limitations on options due to no insurance.    Expected Discharge Plan: Home w Home Health Services Barriers to Discharge: Continued Medical Work up   Patient Goals and CMS Choice Patient states their goals for this hospitalization and ongoing recovery are:: Get assistance at home      Expected Discharge Plan and Services Expected Discharge Plan: Home w Home Health Services       Living arrangements for the past 2 months: Single Family Home                                      Prior Living Arrangements/Services Living arrangements for the past 2 months: Single Family Home Lives with:: Self Patient language and need for interpreter reviewed:: Yes Do you feel safe going back to the place where you live?: Yes      Need for Family Participation in Patient Care: Yes (Comment) Care giver support system in place?: Yes  (comment) Current home services: DME (walker, wheelchair) Criminal Activity/Legal Involvement Pertinent to Current Situation/Hospitalization: No - Comment as needed  Activities of Daily Living      Permission Sought/Granted Permission sought to share information with : Case Manager Permission granted to share information with : Yes, Verbal Permission Granted  Share Information with NAME: TOC Department           Emotional Assessment Appearance:: Appears stated age Attitude/Demeanor/Rapport: Unable to Assess Affect (typically observed): Unable to Assess Orientation: : Oriented to Self, Oriented to Place, Oriented to  Time, Oriented to Situation Alcohol / Substance Use: Not Applicable Psych Involvement: No (comment)  Admission diagnosis:  Amputation Patient Active Problem List   Diagnosis Date Noted  . Ischemic leg 04/20/2020  . Ischemia 04/16/2020  . Ischemia of left lower extremity 05/30/2019  . Atherosclerosis of native arteries of extremity with rest pain (HCC) 04/08/2019  . Lacunar infarction (HCC) 08/03/2017  . Bruit of left carotid artery 08/03/2017  . Type 2 diabetes mellitus with complication, without long-term current use of insulin (HCC) 08/02/2017  . Abscess or cellulitis, neck 10/08/2015  . Hyperglycemia 10/08/2015  . Hypertension goal BP (blood pressure) < 140/90 10/08/2015  . Heart murmur 10/08/2015   PCP:  Toy Cookey, FNP Pharmacy:  [facility] - [place], [place] - [address] [place] [address] Phone: [phone] Fax: [phone]  [facility] [address] [place] - [address] [place] [address] Phone: [phone] Fax: [phone]  [facility] [id number] [place], [place] - [address] [place] [address] Phone: [phone] Fax: [phone]  [facility] [place], [place] - [address] [place]  [address] Phone: [phone] Fax: [phone]     Social Determinants of Health (SDOH) Interventions    Readmission Risk Interventions No flowsheet data found.

## 2020-06-21 NOTE — Progress Notes (Signed)
Pharmacy Antibiotic Note  Jeremy Padilla is a 54 y.o. male admitted on 06/21/2020 with concern for infection of left BKA stump. Vascular planning for revision 7/13. Pharmacy has been consulted for vancomycin and aztreonam dosing.  Renal function slightly elevated above baseline likely s/t dehydration.  Plan: Vancomycin 2 g IV x 1 loading dose followed by 750 mg IV q12h. Will dose cautiously for borderline creatinine in setting of above. Will monitor renal function closely.  Aztreonam 2 g IV q8h  Height: 6\' 2"  (188 cm) Weight: 81.6 kg (180 lb) IBW/kg (Calculated) : 82.2  Temp (24hrs), Avg:98.3 F (36.8 C), Min:98.3 F (36.8 C), Max:98.3 F (36.8 C)  Recent Labs  Lab 06/21/20 1422  WBC 11.7*  CREATININE 1.59*    Estimated Creatinine Clearance: 60.6 mL/min (A) (by C-G formula based on SCr of 1.59 mg/dL (H)).    Allergies  Allergen Reactions  . Penicillins Rash    .Has patient had a PCN reaction causing immediate rash, facial/tongue/throat swelling, SOB or lightheadedness with hypotension: Unknown Has patient had a PCN reaction causing severe rash involving mucus membranes or skin necrosis: Unknown Has patient had a PCN reaction that required hospitalization: Unknown Has patient had a PCN reaction occurring within the last 10 years: Unknown If all of the above answers are "NO", then may proceed with Cephalosporin use.     Antimicrobials this admission: Aztreonam 7/12 >> Vancomycin 7/12 >>   Microbiology results: 7/12 BCx: pending   Thank you for allowing pharmacy to be a part of this patient's care.  9/12, PharmD 06/21/2020 5:47 PM

## 2020-06-21 NOTE — Consult Note (Signed)
[facility] Vascular Consult Note  MRN : [id number]  Jeremy Padilla is a 54 y.o. (Dec 01, 1966) male who presents with chief complaint of  Chief Complaint  Patient presents with  . 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  Patient is known to our service as we have intervened conducting endovascular arterial intervention to his left lower extremity numerous times.  Patient shares to undergo below the knee amputation at [facility] approximately 3 weeks ago.  Patient has noted increased drainage, foul smell and darkening of the skin in regard to his BKA.  Patient notes that he has tried to contact his surgeon at [facility] has been unsuccessful.  This is what prompted him to seek medical attention in our emergency department today.  Patient endorses a history of progressively worsening stump pain.  He denies any fever, nausea or vomiting.  Denies any shortness of breath or chest pain.  Vascular surgery was consulted by Dr. Fanny Bien for possible operative intervention.  No current facility-administered medications for this encounter.   Current Outpatient Medications  Medication Sig Dispense Refill  . acetaminophen (TYLENOL) 650 MG CR tablet Take 650 mg by mouth every 8 (eight) hours as needed for pain.    Marland Kitchen amLODipine (NORVASC) 10 MG tablet Take 1 tablet (10 mg total) by mouth daily. 90 tablet 4  . apixaban (ELIQUIS) 5 MG TABS tablet Take 1 tablet (5 mg total) by mouth 2 (two) times daily. 60 tablet 5  . aspirin EC 81 MG tablet Take 81 mg by mouth daily.    Marland Kitchen atorvastatin (LIPITOR) 40 MG tablet Take 1 tablet (40 mg total) by mouth daily. 90 tablet 3  . glipiZIDE (GLUCOTROL) 10 MG  tablet Take 10 mg by mouth daily before breakfast.    . hydrochlorothiazide (HYDRODIURIL) 25 MG tablet Take 25 mg by mouth daily.    Marland Kitchen linagliptin (TRADJENTA) 5 MG TABS tablet Take 5 mg by mouth daily.    Marland Kitchen lisinopril (ZESTRIL) 40 MG tablet Take 40 mg by mouth daily.    Marland Kitchen oxyCODONE (OXY IR/ROXICODONE) 5 MG immediate release tablet Take 1 tablet (5 mg total) by mouth every 6 (six) hours as needed for moderate pain or severe pain. 28 tablet 0   Past Medical History:  Diagnosis Date  . Abscess 10/05/15  . Diabetes mellitus without complication (HCC)   . 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 Hyperlipidemia   . Hypertension    Past Surgical History:  Procedure Laterality Date  . LOWER EXTREMITY ANGIOGRAPHY Left 04/09/2019   Procedure: LOWER EXTREMITY ANGIOGRAPHY;  Surgeon: Annice Needy, MD;  Location: [facility] INVASIVE CV LAB;  Service: Cardiovascular;  Laterality: Left;  . LOWER EXTREMITY ANGIOGRAPHY Left 04/19/2020   Procedure: Lower Extremity Angiography;  Surgeon: Annice Needy, MD;  Location: [facility] INVASIVE CV LAB;  Service: Cardiovascular;  Laterality: Left;  . LOWER EXTREMITY ANGIOGRAPHY Left 04/20/2020   Procedure: Lower Extremity Angiography;  Surgeon: Annice Needy, MD;  Location: [facility] INVASIVE CV LAB;  Service: Cardiovascular;  Laterality: Left;  . LOWER EXTREMITY INTERVENTION N/A 05/30/2019   Procedure: LOWER EXTREMITY INTERVENTION;  Surgeon: Renford Dills, MD;  Location: [facility] INVASIVE CV LAB;  Service: Cardiovascular;  Laterality: N/A;  . WRIST  SURGERY Left    Social History Social History   Tobacco Use  . Smoking status: Never Smoker  . Smokeless tobacco: Never Used  Vaping Use  . Vaping Use: Never used  Substance Use Topics  . Alcohol use: No  . Drug use: No   Family History Family History  Problem Relation Age of Onset  . Heart disease Father   . Hyperlipidemia Father   . Hypertension Father   Denies family history of peripheral artery disease, venous disease or renal  disease.  Allergies  Allergen Reactions  . Penicillins Rash    .Has patient had a PCN reaction causing immediate rash, facial/tongue/throat swelling, SOB or lightheadedness with hypotension: Unknown Has patient had a PCN reaction causing severe rash involving mucus membranes or skin necrosis: Unknown Has patient had a PCN reaction that required hospitalization: Unknown Has patient had a PCN reaction occurring within the last 10 years: Unknown If all of the above answers are "NO", then may proceed with Cephalosporin use.    REVIEW OF SYSTEMS (Negative unless checked)  Constitutional: [] Weight loss  [] Fever  [] Chills Cardiac: [] Chest pain   [] Chest pressure   [] Palpitations   [] Shortness of breath when laying flat   [] Shortness of breath at rest   [] Shortness of breath with exertion. Vascular:  [] Pain in legs with walking   [] Pain in legs at rest   [] Pain in legs when laying flat   [] Claudication   [] Pain in feet when walking  [] Pain in feet at rest  [] Pain in feet when laying flat   [] History of DVT   [] Phlebitis   [] Swelling in legs   [] Varicose veins   [x] Non-healing ulcers Pulmonary:   [] Uses home oxygen   [] Productive cough   [] Hemoptysis   [] Wheeze  [] COPD   [] Asthma Neurologic:  [] Dizziness  [] Blackouts   [] Seizures   [] History of stroke   [] History of TIA  [] Aphasia   [] Temporary blindness   [] Dysphagia   [] Weakness or numbness in arms   [] Weakness or numbness in legs Musculoskeletal:  [] Arthritis   [] Joint swelling   [] Joint pain   [] Low back pain Hematologic:  [] Easy bruising  [] Easy bleeding   [] Hypercoagulable state   [] Anemic  [] Hepatitis Gastrointestinal:  [] Blood in stool   [] Vomiting blood  [] Gastroesophageal reflux/heartburn   [] Difficulty swallowing. Genitourinary:  [] Chronic kidney disease   [] Difficult urination  [] Frequent urination  [] Burning with urination   [] Blood in urine Skin:  [] Rashes   [x] Ulcers   [x] Wounds Psychological:  [] History of anxiety   []  History of major  depression.  Physical Examination  Vitals:   06/21/20 1339 06/21/20 1342  BP: (!) 145/71   Pulse: 99   Resp: 18   Temp: 98.3 F (36.8 C)   TempSrc: Oral   SpO2: 100%   Weight:  81.6 kg  Height:  6\' 2"  (1.88 m)   Body mass index is 23.11 kg/m. Gen:  WD/WN, NAD Head: [place]/AT, No temporalis wasting. Prominent temp pulse not noted. Ear/Nose/Throat: Hearing grossly intact, nares w/o erythema or drainage, oropharynx w/o Erythema/Exudate Eyes: Sclera non-icteric, conjunctiva clear Neck: Trachea midline.  No JVD.  Pulmonary:  Good air movement, respirations not labored, equal bilaterally.  Cardiac: RRR, normal S1, S2. Vascular:  Vessel Right Left  Radial Palpable Palpable  Ulnar Palpable Palpable  Brachial Palpable Palpable  Carotid Palpable, without bruit Palpable, without bruit  Aorta Not palpable N/A  Femoral Palpable Palpable  Popliteal Palpable Palpable  PT Palpable BKA  DP Palpable BKA  Left lower extremity: Thigh soft.  Knee frozen.  Large area of necrotic tissue changes noted to front of stump.  Purulent discharge noted.  Foul odor.  Media Information   Document Information Photos    06/21/2020 14:58  Attached To:  Hospital Encounter on 06/21/20  Source Information Seyed Heffley, Marvis Moeller  [facility]Emergency Department   Gastrointestinal: soft, non-tender/non-distended. No guarding/reflex.  Musculoskeletal: M/S 5/5 throughout.   Neurologic: Sensation grossly intact in extremities.  Symmetrical.  Speech is fluent. Motor exam as listed above. Psychiatric: Judgment intact, Mood & affect appropriate for pt's clinical situation. Dermatologic: See above Lymph : No Cervical, Axillary, or Inguinal lymphadenopathy.  CBC Lab Results  Component Value Date   WBC 11.7 (H) 06/21/2020   HGB 8.0 (L) 06/21/2020   HCT 22.8 (L) 06/21/2020   MCV 78.9 (L) 06/21/2020   PLT 409 (H) 06/21/2020   BMET    Component Value Date/Time   NA 128 (L) 06/21/2020 1422   K 4.2  06/21/2020 1422   CL 93 (L) 06/21/2020 1422   CO2 28 06/21/2020 1422   GLUCOSE 292 (H) 06/21/2020 1422   BUN 69 (H) 06/21/2020 1422   CREATININE 1.59 (H) 06/21/2020 1422   CALCIUM 9.2 06/21/2020 1422   GFRNONAA 48 (L) 06/21/2020 1422   GFRAA 56 (L) 06/21/2020 1422   Estimated Creatinine Clearance: 60.6 mL/min (A) (by C-G formula based on SCr of 1.59 mg/dL (H)).  COAG Lab Results  Component Value Date   INR 1.0 05/30/2019   INR 1.0 04/07/2019   Radiology No results found.  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  1.  Below the knee amputation: Patient with known history of peripheral artery disease treated by our service endovascularly numerous times.  Patient decided to undergo below the knee amputation at [facility] approximately 3 weeks ago.  Patient notes drainage from his stump since surgery.  Increased pain at the stump.  Patient states that he has tried to contact the surgeon at [facility] numerous times without answer.  This is what prompted him to seek medical attention in our emergency department.  Physical exam with large area of necrotic tissue changes to the front of the stump.  Purulent drainage.  Knee frozen.  Foul odor.  Unfortunately, I do not feel the patient's stump is salvageable.  Recommend a revision above-the-knee amputation.  Patient understands and is in agreement.  We will plan on this on Wednesday with Dr. Wyn Quaker.  Would appreciate assistance from medicine with admission and initiation of antibiotics.  Fluids started.  Blood cultures ordered.  2.  Type 2 diabetes: Unsure if controlled at this time - most likely not. Will order hemoglobin A1c in the a.m. Encouraged good control as its slows the progression of  atherosclerotic disease  3.  Hyperlipidemia: On aspirin and statin for medical management Encouraged good control as its slows the progression of atherosclerotic disease  Discussed with Dr. Weldon Inches, PA-C  06/21/2020 3:22 PM  This note was created with Dragon medical transcription system.  Any error is purely unintentional

## 2020-06-22 ENCOUNTER — Other Ambulatory Visit (INDEPENDENT_AMBULATORY_CARE_PROVIDER_SITE_OTHER): Payer: Self-pay | Admitting: Vascular Surgery

## 2020-06-22 DIAGNOSIS — L8990 Pressure ulcer of unspecified site, unspecified stage: Secondary | ICD-10-CM | POA: Insufficient documentation

## 2020-06-22 DIAGNOSIS — E871 Hypo-osmolality and hyponatremia: Secondary | ICD-10-CM

## 2020-06-22 DIAGNOSIS — I739 Peripheral vascular disease, unspecified: Secondary | ICD-10-CM

## 2020-06-22 LAB — HIV ANTIBODY (ROUTINE TESTING W REFLEX): HIV Screen 4th Generation wRfx: NONREACTIVE

## 2020-06-22 LAB — GLUCOSE, CAPILLARY
Glucose-Capillary: 242 mg/dL — ABNORMAL HIGH (ref 70–99)
Glucose-Capillary: 187 mg/dL — ABNORMAL HIGH (ref 70–99)
Glucose-Capillary: 159 mg/dL — ABNORMAL HIGH (ref 70–99)
Glucose-Capillary: 135 mg/dL — ABNORMAL HIGH (ref 70–99)

## 2020-06-22 LAB — CBC
HCT: 27.9 % — ABNORMAL LOW (ref 39.0–52.0)
Hemoglobin: 9.5 g/dL — ABNORMAL LOW (ref 13.0–17.0)
MCH: 27.1 pg (ref 26.0–34.0)
MCHC: 34.1 g/dL (ref 30.0–36.0)
MCV: 79.5 fL — ABNORMAL LOW (ref 80.0–100.0)
Platelets: 502 K/uL — ABNORMAL HIGH (ref 150–400)
RBC: 3.51 MIL/uL — ABNORMAL LOW (ref 4.22–5.81)
RDW: 13.2 % (ref 11.5–15.5)
WBC: 12.7 K/uL — ABNORMAL HIGH (ref 4.0–10.5)
nRBC: 0 % (ref 0.0–0.2)

## 2020-06-22 LAB — C-REACTIVE PROTEIN: CRP: 11.1 mg/dL — ABNORMAL HIGH

## 2020-06-22 LAB — BASIC METABOLIC PANEL WITH GFR
Anion gap: 12 (ref 5–15)
BUN: 43 mg/dL — ABNORMAL HIGH (ref 6–20)
CO2: 26 mmol/L (ref 22–32)
Calcium: 9.6 mg/dL (ref 8.9–10.3)
Chloride: 96 mmol/L — ABNORMAL LOW (ref 98–111)
Creatinine, Ser: 1.21 mg/dL (ref 0.61–1.24)
GFR calc Af Amer: >60 mL/min
GFR calc non Af Amer: >60 mL/min
Glucose, Bld: 260 mg/dL — ABNORMAL HIGH (ref 70–99)
Potassium: 4.9 mmol/L (ref 3.5–5.1)
Sodium: 134 mmol/L — ABNORMAL LOW (ref 135–145)

## 2020-06-22 LAB — ABO/RH: ABO/RH(D): O POS

## 2020-06-22 LAB — HEMOGLOBIN A1C
Hgb A1c MFr Bld: 8.6 % — ABNORMAL HIGH (ref 4.8–5.6)
Mean Plasma Glucose: 200.12 mg/dL

## 2020-06-22 LAB — PREALBUMIN: Prealbumin: 14.3 mg/dL — ABNORMAL LOW (ref 18–38)

## 2020-06-22 MED ORDER — HYDROMORPHONE HCL 1 MG/ML IJ SOLN
2.0000 mg | INTRAMUSCULAR | Status: DC | PRN
  Administered 2020-06-22: 2 mg via INTRAVENOUS
  Filled 2020-06-22: qty 2

## 2020-06-22 MED ORDER — CHLORHEXIDINE GLUCONATE CLOTH 2 % EX PADS
6.0000 | MEDICATED_PAD | Freq: Once | CUTANEOUS | Status: AC
  Administered 2020-06-22: 6 via TOPICAL

## 2020-06-22 MED ORDER — SODIUM CHLORIDE 0.9 % IV SOLN: INTRAVENOUS | Status: DC

## 2020-06-22 MED ORDER — SODIUM CHLORIDE 1 G PO TABS
2.0000 g | ORAL_TABLET | Freq: Once | ORAL | Status: AC
  Administered 2020-06-22: 2 g via ORAL
  Filled 2020-06-22: qty 2

## 2020-06-22 MED ORDER — OXYCODONE HCL 5 MG PO TABS
5.0000 mg | ORAL_TABLET | ORAL | Status: DC | PRN
  Administered 2020-06-22 – 2020-06-26 (×12): 10 mg via ORAL
  Filled 2020-06-22 (×12): qty 2

## 2020-06-22 MED ORDER — GLIPIZIDE ER 10 MG PO TB24
10.0000 mg | ORAL_TABLET | Freq: Every day | ORAL | Status: DC
  Filled 2020-06-22: qty 1

## 2020-06-22 MED ORDER — METRONIDAZOLE IN NACL 5-0.79 MG/ML-% IV SOLN
500.0000 mg | Freq: Three times a day (TID) | INTRAVENOUS | Status: DC
  Administered 2020-06-22 – 2020-06-24 (×6): 500 mg via INTRAVENOUS
  Filled 2020-06-22 (×9): qty 100

## 2020-06-22 MED ORDER — ENSURE MAX PROTEIN PO LIQD
11.0000 [oz_av] | Freq: Two times a day (BID) | ORAL | Status: DC
  Administered 2020-06-22 – 2020-06-26 (×7): 11 [oz_av] via ORAL
  Filled 2020-06-22: qty 330

## 2020-06-22 MED ORDER — SODIUM CHLORIDE 0.9 % IV SOLN
INTRAVENOUS | Status: DC | PRN
  Administered 2020-06-22: 100 mL via INTRAVENOUS

## 2020-06-22 MED ORDER — SODIUM CHLORIDE 0.9 % IV SOLN
2.0000 g | INTRAVENOUS | Status: DC
  Administered 2020-06-22 – 2020-06-23 (×2): 2 g via INTRAVENOUS
  Filled 2020-06-22 (×2): qty 20
  Filled 2020-06-22 (×2): qty 2

## 2020-06-22 MED ORDER — VANCOMYCIN HCL 1500 MG/300ML IV SOLN: 1500.0000 mg | INTRAVENOUS | Status: DC

## 2020-06-22 MED ORDER — INSULIN GLARGINE 100 UNIT/ML ~~LOC~~ SOLN
12.0000 [IU] | Freq: Every day | SUBCUTANEOUS | Status: DC
  Administered 2020-06-22 – 2020-06-26 (×5): 12 [IU] via SUBCUTANEOUS
  Filled 2020-06-22 (×5): qty 0.12

## 2020-06-22 MED ORDER — OCUVITE-LUTEIN PO CAPS
1.0000 | ORAL_CAPSULE | Freq: Every day | ORAL | Status: DC
  Administered 2020-06-22 – 2020-06-26 (×5): 1 via ORAL
  Filled 2020-06-22 (×5): qty 1

## 2020-06-22 MED ORDER — ASCORBIC ACID 500 MG PO TABS
250.0000 mg | ORAL_TABLET | Freq: Two times a day (BID) | ORAL | Status: DC
  Administered 2020-06-22 – 2020-06-26 (×8): 250 mg via ORAL
  Filled 2020-06-22 (×8): qty 1

## 2020-06-22 MED ORDER — MORPHINE SULFATE (PF) 2 MG/ML IV SOLN
2.0000 mg | INTRAVENOUS | Status: DC | PRN
  Administered 2020-06-22 – 2020-06-23 (×5): 2 mg via INTRAVENOUS
  Filled 2020-06-22 (×5): qty 1

## 2020-06-22 MED ORDER — INSULIN GLARGINE 100 UNIT/ML ~~LOC~~ SOLN
20.0000 [IU] | Freq: Every day | SUBCUTANEOUS | Status: DC
  Filled 2020-06-22: qty 0.2

## 2020-06-22 NOTE — Progress Notes (Addendum)
Initial Nutrition Assessment  DOCUMENTATION CODES:   Severe malnutrition in context of social or environmental circumstances  INTERVENTION:   Ensure Max protein supplement BID, each supplement provides 150kcal and 30g of protein.  Ocuvite daily for wound healing (provides zinc, vitamin A, vitamin C, Vitamin E, copper, and selenium)  Vitamin C 27m po BID   Pt is at high refeed risk; recommend monitor K, Mg and P labs daily   NUTRITION DIAGNOSIS:   Severe Malnutrition related to social / environmental circumstances (depression, poor appetite) as evidenced by 16 percent weight loss in 2 months, severe muscle depletion, severe fat depletion  GOAL:   Patient will meet greater than or equal to 90% of their needs  MONITOR:   PO intake, Supplement acceptance, Labs, Weight trends, Skin, I & O's  REASON FOR ASSESSMENT:   Consult Assessment of nutrition requirement/status  ASSESSMENT:   54y/o male with h/o HTN and DM s/p L BKA 6/21 who is admitted with stump pain   Met with pt in room today. Pt is known to this RD from previous admits. Pt reports decreased appetite and oral intake for 2 months pta. Per chart, pt has lost 30lbs(16%) over the past 2 months; this is severe weight loss. Some weight loss can be attributed to L BKA; pt does have severe fat and muscle depletions on NFPE today. Pt reports that his weight loss is r/t depression and stress as he reports that he recently lost his wife to [email or web address] and then directly after that, he found out his brother had stage IV cancer and then he had to have his BKA. Pt reports that he has been drinking vanilla Premier Protein at home. Pt would like to have Ensure Max while in hospital. RD will add supplements and vitamins to support wound healing. Provided brief DM education today. Plan is for AKA tomorrow.   Medications reviewed and include: aspirin, insulin, NaCl tabs, aztreonam, vancomycin  Labs reviewed: Na 134(L), BUN 43(H) Wbc-  12.7(H), Hgb 9.5(L), Hct 27.9(L), MCV 79.5(L) cbgs- 216, 219, 237, 242 x 24 hrs AIC 8.6(H)- 7/12  NUTRITION - FOCUSED PHYSICAL EXAM:    Most Recent Value  Orbital Region Moderate depletion  Upper Arm Region Severe depletion  Thoracic and Lumbar Region Severe depletion  Buccal Region Moderate depletion  Temple Region Moderate depletion  Clavicle Bone Region Severe depletion  Clavicle and Acromion Bone Region Severe depletion  Scapular Bone Region Severe depletion  Dorsal Hand Severe depletion  Patellar Region Severe depletion  Anterior Thigh Region Severe depletion  Posterior Calf Region Severe depletion  Edema (RD Assessment) None  Hair Reviewed  Eyes Reviewed  Mouth Reviewed  Skin Reviewed  Nails Reviewed     Diet Order:   Diet Order            Diet NPO time specified  Diet effective midnight           Diet Carb Modified Fluid consistency: Thin; Room service appropriate? Yes  Diet effective now                EDUCATION NEEDS:   Education needs have been addressed  Skin:  Skin Assessment: Reviewed RN Assessment (Incision L leg, Stage II sacrum)  Last BM:  PTA  Height:   Ht Readings from Last 1 Encounters:  06/21/20 '6\' 2"'  (1.88 m)    Weight:   Wt Readings from Last 1 Encounters:  06/21/20 70.9 kg    Ideal Body Weight:  81.2 kg (adjusted  for BKA)  BMI:  Body mass index is 20.07 kg/m.  Estimated Nutritional Needs:   Kcal:  2100-2400kcal/day  Protein:  105-120g/day  Fluid:  >2.1L/day  Koleen Distance MS, RD, LDN Please refer to [facility] for RD and/or RD on-call/weekend/after hours pager

## 2020-06-22 NOTE — Progress Notes (Signed)
Inpatient Diabetes Program Recommendations  AACE/ADA: New Consensus Statement on Inpatient Glycemic Control (2015)  Target Ranges:  Prepandial:   less than 140 mg/dL      Peak postprandial:   less than 180 mg/dL (1-2 hours)      Critically ill patients:  140 - 180 mg/dL   Results for EVERETT, EHRLER (MRN [id number]) as of 06/22/2020 07:49  Ref. Range 06/21/2020 21:06 06/22/2020 07:36  Glucose-Capillary Latest Ref Range: 70 - 99 mg/dL 549 (H) 826 (H)   Results for SNYDER, COLAVITO (MRN [id number]) as of 06/22/2020 07:49  Ref. Range 04/20/2020 06:17 06/21/2020 20:17  Hemoglobin A1C Latest Ref Range: 4.8 - 5.6 % 10.1 (H) 8.6 (H)    Admit with: BKA stump complication  History: DM, BKA  Home DM Meds: Lantus 8 units QHS       Glipizide 10 mg Daily  Current Orders: Novolog Moderate Correction Scale/ SSI (0-15 units) TID AC     MD- Note patient is taking Lantus insulin at home.  CBG 242 this AM.  Please consider starting Lantus 8 units Daily (home dose)     --Will follow patient during hospitalization--  Ambrose Finland RN, MSN, CDE Diabetes Coordinator Inpatient Glycemic Control Team Team Pager: [phone] (8a-5p)

## 2020-06-22 NOTE — Consult Note (Signed)
WOC Nurse Consult Note: Reason for Consult:Patient with infection to left BKA site. Surgery has consulted and revision with AKA is planned.    Will implement topical orders for today Wound type:infectious vascular wound, s/p BKA.   Pressure Injury POA: NA Measurement: Stump is necrotic and surgery feels stump is viable and planning for revision.  Wound bed: eschar and devitalized tissue with staples in place.  Drainage (amount, consistency, odor) minimal serosanguinous with necrotic odor.  Periwound:intact Dressing procedure/placement/frequency: Cleanse left stump with NS and pat dry  Apply Xeroform gauze to necrosis.  Cover with dry dressing and kerlix/tape  Change daily until surgery.  Will not follow at this time.  Please re-consult if needed.  Maple Hudson MSN, RN, FNP-BC CWON Wound, Ostomy, Continence Nurse Pager [phone]

## 2020-06-22 NOTE — TOC Progression Note (Signed)
Transition of Care [facility]) - Progression Note    Patient Details  Name: Jeremy Padilla MRN: [id number] Date of Birth: 05-02-1966  Transition of Care [facility]) CM/SW Contact  Chapman Fitch, RN Phone Number: 06/22/2020, 4:57 PM  Clinical Narrative:     Plan for AKA tomorrow MD to order PT eval post op.  Discussed with patient, states that his cousin by marriage Bonita Quin is helping with the paper work (for ssi and medicaid).  He requests that I call her for more detail.  I did let the patient know that I would need one family member as a point person, and they could disseminate the information to the other family members.   RNCM spoke with Bonita Quin - [phone].  She confirms that they have applied for ssi as well as Medicaid.  She is to reach out to both tomorrow to see if she can obtain a pending number, or written confirmation.  She will follow up with me tomorrow  Heads up referral made to [place] with [facility] for charity home health services.   Per Bonita Quin patient is followed by [facility], and Medication Management    Expected Discharge Plan: Home w Home Health Services Barriers to Discharge: Continued Medical Work up  Expected Discharge Plan and Services Expected Discharge Plan: Home w Home Health Services       Living arrangements for the past 2 months: Single Family Home                                       Social Determinants of Health (SDOH) Interventions    Readmission Risk Interventions No flowsheet data found.

## 2020-06-22 NOTE — Progress Notes (Signed)
[facility] Daily Progress Note   Subjective: Patient still in agreement to move forward with surgery tomorrow.  Objective: Vitals:   06/21/20 1817 06/21/20 2105 06/22/20 0013 06/22/20 0446  BP: (!) 157/79 (!) 157/99 126/77 (!) 159/76  Pulse: 93 (!) 101 (!) 106 (!) 110  Resp: _0 Temp:  98 F (36.7 C) 98.3 F (36.8 C) 98.4 F (36.9 C)  TempSrc:  Oral Oral Oral  SpO2: 100% 100% 98% 100%  Weight:  70.9 kg    Height:  _1  (1.88 m)      Intake/Output Summary (Last 24 hours) at 06/22/2020 1028 Last data filed at 06/22/2020 0239 Gross per 24 hour  Intake --  Output 1100 ml  Net -1100 ml   Physical Exam: A&Ox3, NAD CV: RRR Pulmonary: CTA Bilaterally Abdomen: Soft, Nontender, Nondistended Vascular:  Left lower extremity: Stable wound.  None viable below the knee amputation.   Laboratory: CBC    Component Value Date/Time   WBC 12.7 (H) 06/22/2020 0406   HGB 9.5 (L) 06/22/2020 0406   HCT 27.9 (L) 06/22/2020 0406   PLT 502 (H) 06/22/2020 0406   BMET    Component Value Date/Time   NA 134 (L) 06/22/2020 0406   K 4.9 06/22/2020 0406   CL 96 (L) 06/22/2020 0406   CO2 26 06/22/2020 0406   GLUCOSE 260 (H) 06/22/2020 0406   BUN 43 (H) 06/22/2020 0406   CREATININE 1.21 06/22/2020 0406   CALCIUM 9.6 06/22/2020 0406   GFRNONAA >60 06/22/2020 0406   GFRAA >60 06/22/2020 0406   Assessment/Planning: The patient is a 54 year old male well-known to our service as we have treated him endovascularly for his peripheral artery disease, s/p below the knee amputation at [facility] approximately 3 weeks ago presents to the hospital with nonviable stump  1) met with patient and 2 of his family members at bedside 2) patient currently does not have any insurance and family states this is the reason he was not discharged to SNF status post his BKA at [facility].  Family members also note he was discharged home without home health services due to lack of insurance.  They do  state that an insurance approval is pending.  Will consult social work and involve them. 3) patient is still consenting for above-the-knee amputation tomorrow.  Procedure, risks and benefits were explained to the patient in the presence of his 2 family members.  All questions were answered.  The patient wishes to proceed.  Discussed with Dr. Ellis Parents Viann Nielson PA-C 06/22/2020 10:28 AM

## 2020-06-22 NOTE — Progress Notes (Signed)
[facility]  - [place] at [facility]   PATIENT NAME: Jeremy Padilla    MR#:  [id number]  DATE OF BIRTH:  Sep 15, 1966  SUBJECTIVE:   Patient came in with significant pain foul-smelling and black necrotic area over his left below knee amputation stump. He recently underwent left below knee amputation for severe peripheral arterial disease at [facility] on 05/31/2020  Patient's sister in the room. REVIEW OF SYSTEMS:   Review of Systems  Constitutional: Negative for chills, fever and weight loss.  HENT: Negative for ear discharge, ear pain and nosebleeds.   Eyes: Negative for blurred vision, pain and discharge.  Respiratory: Negative for sputum production, shortness of breath, wheezing and stridor.   Cardiovascular: Negative for chest pain, palpitations, orthopnea and PND.  Gastrointestinal: Negative for abdominal pain, diarrhea, nausea and vomiting.  Genitourinary: Negative for frequency and urgency.  Musculoskeletal: Positive for joint pain. Negative for back pain.  Neurological: Positive for weakness. Negative for sensory change, speech change and focal weakness.  Psychiatric/Behavioral: Negative for depression and hallucinations. The patient is not nervous/anxious.    Tolerating Diet:yes Tolerating PT: pending  DRUG ALLERGIES:   Allergies  Allergen Reactions  . Penicillins Rash    .Has patient had a PCN reaction causing immediate rash, facial/tongue/throat swelling, SOB or lightheadedness with hypotension: Unknown Has patient had a PCN reaction causing severe rash involving mucus membranes or skin necrosis: Unknown Has patient had a PCN reaction that required hospitalization: Unknown Has patient had a PCN reaction occurring within the last 10 years: Unknown If all of the above answers are "NO", then may proceed with Cephalosporin use.     VITALS:  Blood pressure (!) 159/76, pulse (!) 110, temperature 98.4 F (36.9 C), temperature source Oral, resp. rate 16, height  6\' 2"  (1.88 m), weight 70.9 kg, SpO2 100 %.  PHYSICAL EXAMINATION:   Physical Exam  GENERAL:  54 y.o.-year-old patient lying in the bed with no acute distress.  EYES: Pupils equal, round, reactive to light and accommodation. No scleral icterus.   HEENT: Head atraumatic, normocephalic. Oropharynx and nasopharynx clear.  NECK:  Supple, no jugular venous distention. No thyroid enlargement, no tenderness.  LUNGS: Normal breath sounds bilaterally, no wheezing, rales, rhonchi. No use of accessory muscles of respiration.  CARDIOVASCULAR: S1, S2 normal. No murmurs, rubs, or gallops.  ABDOMEN: Soft, nontender, nondistended. Bowel sounds present. No organomegaly or mass.  EXTREMITIES: left LE on 06/22/2020    NEUROLOGIC: Cranial nerves II through XII are intact. No focal Motor or sensory deficits b/l.   PSYCHIATRIC:  patient is alert and oriented x 3.  SKIN: No obvious rash, lesion, or ulcer.  Pressure Injury 06/21/20 Sacrum Mid Stage 2 -  Partial thickness loss of dermis presenting as a shallow open injury with a red, pink wound bed without slough. pink (Active)  06/21/20 1900  Location: Sacrum  Location Orientation: Mid  Staging: Stage 2 -  Partial thickness loss of dermis presenting as a shallow open injury with a red, pink wound bed without slough.  Wound Description (Comments): pink  Present on Admission: Yes      LABORATORY PANEL:  CBC Recent Labs  Lab 06/22/20 0406  WBC 12.7*  HGB 9.5*  HCT 27.9*  PLT 502*    Chemistries  Recent Labs  Lab 06/22/20 0406  NA 134*  K 4.9  CL 96*  CO2 26  GLUCOSE 260*  BUN 43*  CREATININE 1.21  CALCIUM 9.6   Cardiac Enzymes No results for input(s): TROPONINI  in the last 168 hours. RADIOLOGY:  No results found. ASSESSMENT AND PLAN:  Jeremy Padilla is a 54 y.o. male with medical history significant for diabetes mellitus, peripheral vascular disease status post recent left BKA which is done [facility], history of hypertension  who presents to the emergency room for evaluation of pain in his left BKA stump.  BKA stump gangrene/necrosis -Patient is status post recent 05/31/2020) left BKA for severe peripheral arterial disease and has complications following surgery with necrosis/gangrene -Vascular surgery has been consulted--plans for left AKA on Wednesday by  Dr Wyn Quaker We will place patient empirically on vancomycin and aztreonam since he is allergic to penicillin Follow-up results of wound and blood cultures  -Iv vanc and Aztreonam -prn po dilaudid and oxycodone  Hypertension -Blood pressure stable -Continue amlodipine and lisinopril  Diabetes mellitus type 2, uncontrolled, hyperglycemia with severe PAD -Hold oral hypoglycemic agents -Maintain consistent carbohydrate diet -Glycemic control with sliding scale insulin and lantus 12 units qd  Hyponatremia -Most likely secondary to hydrochlorothiazide use and volume depletion from dehydration -Hold hydrochlorothiazide for now -IV fluid resuscitation  Severe PAD --s/p left BKA 05/31/2020 at [facility] -- take po eliquis--on hold for scheduled left AKA on wed  Dehydration/AKI -From poor oral intake -Patient has a baseline serum creatinine of 1.12 but today on admission it is 1.59--down to 1.2 -cont IVF   DVT prophylaxis: Lovenox Code Status: Full code Family Communication: Greater than 50% of time was spent discussing plan of care with patient at the bedside.  All questions and concerns have been addressed.  He verbalizes understanding and agrees with the plan. Disposition Plan: TBD--most likely home given no insurance and very limited options Per TOC--so far [facility] agency not able to offer a help Consults called: Vascular surgery  Status is: Inpatient  Remains inpatient appropriate because:Ongoing active pain requiring inpatient pain management   Dispo: The patient is from: Home              Anticipated d/c is to: Home              Anticipated d/c  date is: > 3 days              Patient currently is not medically stable to d/c. Needs AKA for necrotic/gangrenous stump   TOTAL TIME TAKING CARE OF THIS PATIENT: *25* minutes.  >50% time spent on counselling and coordination of care  Note: This dictation was prepared with Dragon dictation along with smaller phrase technology. Any transcriptional errors that result from this process are unintentional.  Enedina Finner M.D    [facility]   CC: Primary care physician; Toy Cookey, FNPPatient ID: Ralene Muskrat, male   DOB: 04-05-1966, 54 y.o.   MRN: [id number]

## 2020-06-22 NOTE — Anesthesia Preprocedure Evaluation (Addendum)
Anesthesia Evaluation  Patient identified by MRN, date of birth, ID band Patient awake    Reviewed: Allergy & Precautions, NPO status , Patient's Chart, lab work & pertinent test results  History of Anesthesia Complications Negative for: history of anesthetic complications  Airway Mallampati: II  TM Distance: >3 FB Neck ROM: Full    Dental no notable dental hx. (+) Teeth Intact, Dental Advisory Given   Pulmonary neg pulmonary ROS, neg sleep apnea, neg COPD, Patient abstained from smoking.Not current smoker,    Pulmonary exam normal breath sounds clear to auscultation       Cardiovascular Exercise Tolerance: Poor METShypertension, + Peripheral Vascular Disease  (-) CAD and (-) Past MI (-) dysrhythmias  Rhythm:Regular Rate:Normal - Systolic murmurs    Neuro/Psych negative neurological ROS  negative psych ROS   GI/Hepatic neg GERD  ,(+)     (-) substance abuse  ,   Endo/Other  diabetes  Renal/GU negative Renal ROS     Musculoskeletal   Abdominal   Peds  Hematology   Anesthesia Other Findings Past Medical History: 10/05/15: Abscess No date: Diabetes mellitus without complication (HCC) No date: Herpes exposure No date: Hyperlipidemia No date: Hypertension   Reproductive/Obstetrics                            Anesthesia Physical Anesthesia Plan  ASA: III  Anesthesia Plan: General   Post-op Pain Management:    Induction: Intravenous  PONV Risk Score and Plan: 3 and Ondansetron, Dexamethasone and Midazolam  Airway Management Planned: LMA  Additional Equipment: None  Intra-op Plan:   Post-operative Plan: Extubation in OR  Informed Consent: I have reviewed the patients History and Physical, chart, labs and discussed the procedure including the risks, benefits and alternatives for the proposed anesthesia with the patient or authorized representative who has indicated his/her  understanding and acceptance.     Dental advisory given  Plan Discussed with: CRNA and Surgeon  Anesthesia Plan Comments: (Discussed risks of anesthesia with patient, including PONV, sore throat, lip/dental damage. Rare risks discussed as well, such as cardiorespiratory and neurological sequelae. Patient understands. Patient counseled on being higher risk for anesthesia due to comorbidities: vascular disease. Patient was told about increased risk of cardiac and respiratory events, including death. Patient understands. )        Anesthesia Quick Evaluation

## 2020-06-23 ENCOUNTER — Telehealth (HOSPITAL_COMMUNITY): Payer: Self-pay

## 2020-06-23 ENCOUNTER — Encounter: Payer: Self-pay | Admitting: Internal Medicine

## 2020-06-23 ENCOUNTER — Encounter
Admission: EM | Disposition: A | Discharge status: Home Health | Payer: Self-pay | Source: Home / Self Care | Attending: Internal Medicine

## 2020-06-23 ENCOUNTER — Inpatient Hospital Stay: Payer: Medicaid Other | Admitting: Anesthesiology

## 2020-06-23 DIAGNOSIS — E43 Unspecified severe protein-calorie malnutrition: Secondary | ICD-10-CM | POA: Insufficient documentation

## 2020-06-23 DIAGNOSIS — I96 Gangrene, not elsewhere classified: Secondary | ICD-10-CM

## 2020-06-23 HISTORY — PX: AMPUTATION: SHX166

## 2020-06-23 LAB — APTT: aPTT: 37 seconds — ABNORMAL HIGH (ref 24–36)

## 2020-06-23 LAB — BASIC METABOLIC PANEL
Anion gap: 9 (ref 5–15)
BUN: 28 mg/dL — ABNORMAL HIGH (ref 6–20)
CO2: 26 mmol/L (ref 22–32)
Calcium: 9.1 mg/dL (ref 8.9–10.3)
Chloride: 100 mmol/L (ref 98–111)
Creatinine, Ser: 1.01 mg/dL (ref 0.61–1.24)
GFR calc Af Amer: >60 mL/min (ref >60)
GFR calc non Af Amer: >60 mL/min (ref >60)
Glucose, Bld: 182 mg/dL — ABNORMAL HIGH (ref 70–99)
Potassium: 4.1 mmol/L (ref 3.5–5.1)
Sodium: 135 mmol/L (ref 135–145)

## 2020-06-23 LAB — CBC
HCT: 26.6 % — ABNORMAL LOW (ref 39.0–52.0)
Hemoglobin: 9.1 g/dL — ABNORMAL LOW (ref 13.0–17.0)
MCH: 27.2 pg (ref 26.0–34.0)
MCHC: 34.2 g/dL (ref 30.0–36.0)
MCV: 79.6 fL — ABNORMAL LOW (ref 80.0–100.0)
Platelets: 467 10*3/uL — ABNORMAL HIGH (ref 150–400)
RBC: 3.34 MIL/uL — ABNORMAL LOW (ref 4.22–5.81)
RDW: 13.2 % (ref 11.5–15.5)
WBC: 14.5 10*3/uL — ABNORMAL HIGH (ref 4.0–10.5)
nRBC: 0 % (ref 0.0–0.2)

## 2020-06-23 LAB — GLUCOSE, CAPILLARY
Glucose-Capillary: 191 mg/dL — ABNORMAL HIGH (ref 70–99)
Glucose-Capillary: 195 mg/dL — ABNORMAL HIGH (ref 70–99)
Glucose-Capillary: 188 mg/dL — ABNORMAL HIGH (ref 70–99)
Glucose-Capillary: 208 mg/dL — ABNORMAL HIGH (ref 70–99)
Glucose-Capillary: 173 mg/dL — ABNORMAL HIGH (ref 70–99)
Glucose-Capillary: 146 mg/dL — ABNORMAL HIGH (ref 70–99)

## 2020-06-23 LAB — PROTIME-INR
INR: 1.2 (ref 0.8–1.2)
Prothrombin Time: 15 seconds (ref 11.4–15.2)

## 2020-06-23 LAB — MAGNESIUM: Magnesium: 1.9 mg/dL (ref 1.7–2.4)

## 2020-06-23 SURGERY — AMPUTATION, ABOVE KNEE
Anesthesia: General | Site: Knee | Laterality: Left

## 2020-06-23 MED ORDER — KETOROLAC TROMETHAMINE 15 MG/ML IJ SOLN
15.0000 mg | Freq: Three times a day (TID) | INTRAMUSCULAR | Status: DC
  Administered 2020-06-23 – 2020-06-24 (×3): 15 mg via INTRAVENOUS
  Filled 2020-06-23 (×3): qty 1

## 2020-06-23 MED ORDER — FENTANYL CITRATE (PF) 100 MCG/2ML IJ SOLN
INTRAMUSCULAR | Status: AC
  Filled 2020-06-23: qty 2

## 2020-06-23 MED ORDER — PHENYLEPHRINE HCL (PRESSORS) 10 MG/ML IV SOLN
INTRAVENOUS | Status: DC | PRN
  Administered 2020-06-23: 150 ug via INTRAVENOUS
  Administered 2020-06-23 (×2): 100 ug via INTRAVENOUS

## 2020-06-23 MED ORDER — SEVOFLURANE IN SOLN
RESPIRATORY_TRACT | Status: AC
  Filled 2020-06-23: qty 250

## 2020-06-23 MED ORDER — VANCOMYCIN HCL IN DEXTROSE 1-5 GM/200ML-% IV SOLN
1000.0000 mg | Freq: Two times a day (BID) | INTRAVENOUS | Status: DC
  Administered 2020-06-23 (×2): 1000 mg via INTRAVENOUS
  Filled 2020-06-23 (×4): qty 200

## 2020-06-23 MED ORDER — ONDANSETRON HCL 4 MG/2ML IJ SOLN: 4.0000 mg | Freq: Once | INTRAMUSCULAR | Status: DC | PRN

## 2020-06-23 MED ORDER — MIDAZOLAM HCL 2 MG/2ML IJ SOLN
INTRAMUSCULAR | Status: DC | PRN
  Administered 2020-06-23: 2 mg via INTRAVENOUS

## 2020-06-23 MED ORDER — FENTANYL CITRATE (PF) 100 MCG/2ML IJ SOLN
INTRAMUSCULAR | Status: DC | PRN
  Administered 2020-06-23: 100 ug via INTRAVENOUS
  Administered 2020-06-23 (×2): 50 ug via INTRAVENOUS

## 2020-06-23 MED ORDER — SODIUM CHLORIDE 0.9 % IV SOLN
INTRAVENOUS | Status: DC | PRN
  Administered 2020-06-23: 50 ug/min via INTRAVENOUS

## 2020-06-23 MED ORDER — ACETAMINOPHEN 10 MG/ML IV SOLN: 1000.0000 mg | Freq: Once | INTRAVENOUS | Status: DC | PRN

## 2020-06-23 MED ORDER — OXYCODONE HCL 5 MG/5ML PO SOLN: 5.0000 mg | Freq: Once | ORAL | Status: DC | PRN

## 2020-06-23 MED ORDER — LIDOCAINE HCL (CARDIAC) PF 100 MG/5ML IV SOSY
PREFILLED_SYRINGE | INTRAVENOUS | Status: DC | PRN
  Administered 2020-06-23: 80 mg via INTRAVENOUS

## 2020-06-23 MED ORDER — HYDROMORPHONE HCL 1 MG/ML IJ SOLN
INTRAMUSCULAR | Status: AC
  Filled 2020-06-23: qty 1

## 2020-06-23 MED ORDER — PROPOFOL 10 MG/ML IV BOLUS
INTRAVENOUS | Status: DC | PRN
  Administered 2020-06-23: 100 mg via INTRAVENOUS

## 2020-06-23 MED ORDER — PHENYLEPHRINE HCL (PRESSORS) 10 MG/ML IV SOLN
INTRAVENOUS | Status: AC
  Filled 2020-06-23: qty 1

## 2020-06-23 MED ORDER — MIDAZOLAM HCL 2 MG/2ML IJ SOLN
INTRAMUSCULAR | Status: AC
  Filled 2020-06-23: qty 2

## 2020-06-23 MED ORDER — ONDANSETRON HCL 4 MG/2ML IJ SOLN
INTRAMUSCULAR | Status: DC | PRN
  Administered 2020-06-23: 4 mg via INTRAVENOUS

## 2020-06-23 MED ORDER — OXYCODONE HCL 5 MG PO TABS: 5.0000 mg | ORAL_TABLET | Freq: Once | ORAL | Status: DC | PRN

## 2020-06-23 MED ORDER — HYDROMORPHONE HCL 1 MG/ML IJ SOLN
0.5000 mg | INTRAMUSCULAR | Status: DC | PRN
  Administered 2020-06-23 (×3): 0.5 mg via INTRAVENOUS

## 2020-06-23 MED ORDER — LIDOCAINE HCL (PF) 2 % IJ SOLN
INTRAMUSCULAR | Status: AC
  Filled 2020-06-23: qty 5

## 2020-06-23 MED ORDER — KETAMINE HCL 50 MG/ML IJ SOLN
INTRAMUSCULAR | Status: DC | PRN
Start: 2020-06-23 — End: 2020-06-23
  Administered 2020-06-23 (×2): 25 mg via INTRAMUSCULAR

## 2020-06-23 MED ORDER — HYDROMORPHONE HCL 1 MG/ML IJ SOLN
INTRAMUSCULAR | Status: AC
  Administered 2020-06-23: 0.5 mg via INTRAVENOUS
  Filled 2020-06-23: qty 1

## 2020-06-23 MED ORDER — ONDANSETRON HCL 4 MG/2ML IJ SOLN
INTRAMUSCULAR | Status: AC
  Filled 2020-06-23: qty 2

## 2020-06-23 MED ORDER — FENTANYL CITRATE (PF) 100 MCG/2ML IJ SOLN
25.0000 ug | INTRAMUSCULAR | Status: DC | PRN
  Administered 2020-06-23 (×3): 50 ug via INTRAVENOUS

## 2020-06-23 SURGICAL SUPPLY — 36 items
BLADE SAGITTAL WIDE XTHICK NO (BLADE) ×3 IMPLANT
BNDG COHESIVE 4X5 TAN STRL (GAUZE/BANDAGES/DRESSINGS) ×3 IMPLANT
BNDG ELASTIC 6X5.8 VLCR NS LF (GAUZE/BANDAGES/DRESSINGS) ×3 IMPLANT
BNDG GAUZE 4.5X4.1 6PLY STRL (MISCELLANEOUS) ×6 IMPLANT
BRUSH SCRUB EZ  4% CHG (MISCELLANEOUS) ×2
BRUSH SCRUB EZ 4% CHG (MISCELLANEOUS) ×1 IMPLANT
CANISTER SUCT 1200ML W/VALVE (MISCELLANEOUS) ×3 IMPLANT
CHLORAPREP W/TINT 26ML (MISCELLANEOUS) ×3 IMPLANT
COVER WAND RF STERILE (DRAPES) ×3 IMPLANT
DRAPE INCISE IOBAN 66X45 STRL (DRAPES) ×3 IMPLANT
DRAPE INCISE IOBAN 66X60 STRL (DRAPES) ×3 IMPLANT
ELECT CAUTERY BLADE 6.4 (BLADE) ×3 IMPLANT
ELECT REM PT RETURN 9FT ADLT (ELECTROSURGICAL) ×3
ELECTRODE REM PT RTRN 9FT ADLT (ELECTROSURGICAL) ×1 IMPLANT
GAUZE XEROFORM 1X8 LF (GAUZE/BANDAGES/DRESSINGS) ×6 IMPLANT
GLOVE BIO SURGEON STRL SZ7 (GLOVE) ×6 IMPLANT
GLOVE INDICATOR 7.5 STRL GRN (GLOVE) ×3 IMPLANT
GOWN STRL REUS W/ TWL LRG LVL3 (GOWN DISPOSABLE) ×1 IMPLANT
GOWN STRL REUS W/ TWL XL LVL3 (GOWN DISPOSABLE) ×2 IMPLANT
GOWN STRL REUS W/TWL LRG LVL3 (GOWN DISPOSABLE) ×2
GOWN STRL REUS W/TWL XL LVL3 (GOWN DISPOSABLE) ×4
HANDLE YANKAUER SUCT BULB TIP (MISCELLANEOUS) ×3 IMPLANT
KIT TURNOVER KIT A (KITS) ×3 IMPLANT
LABEL OR SOLS (LABEL) ×3 IMPLANT
NS IRRIG 1000ML POUR BTL (IV SOLUTION) ×3 IMPLANT
PACK EXTREMITY (MISCELLANEOUS) ×3 IMPLANT
PAD ABD DERMACEA PRESS 5X9 (GAUZE/BANDAGES/DRESSINGS) ×6 IMPLANT
PAD PREP 24X41 OB/GYN DISP (PERSONAL CARE ITEMS) ×3 IMPLANT
SPONGE LAP 18X18 RF (DISPOSABLE) ×6 IMPLANT
STAPLER SKIN PROX 35W (STAPLE) ×3 IMPLANT
STOCKINETTE M/LG 89821 (MISCELLANEOUS) ×3 IMPLANT
SUT SILK 2 0 (SUTURE) ×2
SUT SILK 2 0 SH (SUTURE) ×6 IMPLANT
SUT SILK 2-0 18XBRD TIE 12 (SUTURE) ×1 IMPLANT
SUT VIC AB 0 CT1 36 (SUTURE) ×10 IMPLANT
SUT VIC AB 2-0 CT1 (SUTURE) ×6 IMPLANT

## 2020-06-23 NOTE — Progress Notes (Signed)
   06/23/20 0453  Assess: MEWS Score  Temp 100 F (37.8 C)  BP (!) 142/84  Pulse Rate (!) 124 (RN Notified)  Resp 18  SpO2 99 %  Assess: MEWS Score  MEWS Temp 0  MEWS Systolic 0  MEWS Pulse 2  MEWS RR 0  MEWS LOC 0  MEWS Score 2  MEWS Score Color Yellow  Assess: if the MEWS score is Yellow or Red  Were vital signs taken at a resting state? Yes  Focused Assessment Documented focused assessment  Early Detection of Sepsis Score *See Row Information* Medium  MEWS guidelines implemented *See Row Information* Yes  Treat  MEWS Interventions Administered prn meds/treatments  Take Vital Signs  Increase Vital Sign Frequency  Yellow: Q 2hr X 2 then Q 4hr X 2, if remains yellow, continue Q 4hrs  Escalate  MEWS: Escalate Yellow: discuss with charge nurse/RN and consider discussing with provider and RRT (pt in pain PRN pain meds given - no need to contact provider)  Notify: Charge Nurse/RN  Name of Charge Nurse/RN Notified Crystal   Date Charge Nurse/RN Notified 06/23/20  Time Charge Nurse/RN Notified 0500   Pt in pain 10/10 when VS was taken. PRN morphine given, dressings changed. Discussed with Immunologist. Will recheck at 0700. Will hold provider notification for now since PRNs already in place.  VS frequency increased per yellow MEWS guidelines.

## 2020-06-23 NOTE — Progress Notes (Addendum)
Pharmacy Antibiotic Note  Jeremy Padilla is a 54 y.o. male admitted on 06/21/2020 with concern for infection of left BKA stump. Vascular planning for revision 7/13. Pharmacy was consulted for vancomycin dosing. She has a h/o rash to penicillins but tolerates cephalosporins. This is day  # 3 of broad-spectrum antibiotics, renal function has improved since admission   Plan:     vancomycin   Adjust vancomycin dose to 1000 mg IV q12h  Ke: 0.084 h-1, T 1/2: 8.1h  Css (calculated): 30.8/12.2 mcg/mL  Scr in am to assess renal funcion  Height: 6\' 2"  (188 cm) Weight: 70.9 kg (156 lb 4.9 oz) IBW/kg (Calculated) : 82.2  Temp (24hrs), Avg:99.2 F (37.3 C), Min:98.6 F (37 C), Max:100 F (37.8 C)  Recent Labs  Lab 06/21/20 1422 06/21/20 2017 06/22/20 0406 06/23/20 0417  WBC 11.7*  --  12.7* 14.5*  CREATININE 1.59* 1.17 1.21 1.01    Estimated Creatinine Clearance: 82.9 mL/min (by C-G formula based on SCr of 1.01 mg/dL).    Allergies  Allergen Reactions  . Penicillins Rash    .Has patient had a PCN reaction causing immediate rash, facial/tongue/throat swelling, SOB or lightheadedness with hypotension: Unknown Has patient had a PCN reaction causing severe rash involving mucus membranes or skin necrosis: Unknown Has patient had a PCN reaction that required hospitalization: Unknown Has patient had a PCN reaction occurring within the last 10 years: Unknown If all of the above answers are "NO", then may proceed with Cephalosporin use.     Antimicrobials this admission: aztreonam 7/12 >> 7/13 vancomycin 7/12 >> metronidazole 7/13 >> ceftriaxone 7/12 >>  Microbiology results: 7/12 BCx: NG x 2 days 7/12 SARS CoV-2: negative  Thank you for allowing pharmacy to be a part of this patient's care.  9/12, PharmD 06/23/2020 7:23 AM

## 2020-06-23 NOTE — Telephone Encounter (Signed)
Telephone call to introduce PV Navigator and role. No answer. Will attempt again later.  Hilma Favors RN BSN CWS PV Navigator [place]

## 2020-06-23 NOTE — Progress Notes (Signed)
[facility]  - [place] at [facility]   PATIENT NAME: Jeremy Padilla    MR#:  [id number]  DATE OF BIRTH:  November 06, 1966  SUBJECTIVE:   Patient came in with significant pain foul-smelling and black necrotic area over his left below knee amputation stump. He recently underwent left below knee amputation for severe peripheral arterial disease at [facility] on 05/31/2020  NPO for left AKA today REVIEW OF SYSTEMS:   Review of Systems  Constitutional: Negative for chills, fever and weight loss.  HENT: Negative for ear discharge, ear pain and nosebleeds.   Eyes: Negative for blurred vision, pain and discharge.  Respiratory: Negative for sputum production, shortness of breath, wheezing and stridor.   Cardiovascular: Negative for chest pain, palpitations, orthopnea and PND.  Gastrointestinal: Negative for abdominal pain, diarrhea, nausea and vomiting.  Genitourinary: Negative for frequency and urgency.  Musculoskeletal: Positive for joint pain. Negative for back pain.  Neurological: Positive for weakness. Negative for sensory change, speech change and focal weakness.  Psychiatric/Behavioral: Negative for depression and hallucinations. The patient is not nervous/anxious.    Tolerating Diet:yes Tolerating PT: pending  DRUG ALLERGIES:   Allergies  Allergen Reactions  . Penicillins Rash    .Has patient had a PCN reaction causing immediate rash, facial/tongue/throat swelling, SOB or lightheadedness with hypotension: Unknown Has patient had a PCN reaction causing severe rash involving mucus membranes or skin necrosis: Unknown Has patient had a PCN reaction that required hospitalization: Unknown Has patient had a PCN reaction occurring within the last 10 years: Unknown If all of the above answers are "NO", then may proceed with Cephalosporin use.     VITALS:  Blood pressure (!) 170/78, pulse (!) 117, temperature 98.4 F (36.9 C), temperature source Oral, resp. rate 20, height 6\' 2"   (1.88 m), weight 70.9 kg, SpO2 100 %.  PHYSICAL EXAMINATION:   Physical Exam  GENERAL:  54 y.o.-year-old patient lying in the bed with no acute distress.  EYES: Pupils equal, round, reactive to light and accommodation. No scleral icterus.   HEENT: Head atraumatic, normocephalic. Oropharynx and nasopharynx clear.  NECK:  Supple, no jugular venous distention. No thyroid enlargement, no tenderness.  LUNGS: Normal breath sounds bilaterally, no wheezing, rales, rhonchi. No use of accessory muscles of respiration.  CARDIOVASCULAR: S1, S2 normal. No murmurs, rubs, or gallops.  ABDOMEN: Soft, nontender, nondistended. Bowel sounds present. No organomegaly or mass.  EXTREMITIES: left LE on 06/22/2020    NEUROLOGIC: Cranial nerves II through XII are intact. No focal Motor or sensory deficits b/l.   PSYCHIATRIC:  patient is alert and oriented x 3.  SKIN: No obvious rash, lesion, or ulcer.  Pressure Injury 06/21/20 Sacrum Mid Stage 2 -  Partial thickness loss of dermis presenting as a shallow open injury with a red, pink wound bed without slough. pink (Active)  06/21/20 1900  Location: Sacrum  Location Orientation: Mid  Staging: Stage 2 -  Partial thickness loss of dermis presenting as a shallow open injury with a red, pink wound bed without slough.  Wound Description (Comments): pink  Present on Admission: Yes      LABORATORY PANEL:  CBC Recent Labs  Lab 06/23/20 0417  WBC 14.5*  HGB 9.1*  HCT 26.6*  PLT 467*    Chemistries  Recent Labs  Lab 06/23/20 0417  NA 135  K 4.1  CL 100  CO2 26  GLUCOSE 182*  BUN 28*  CREATININE 1.01  CALCIUM 9.1  MG 1.9   Cardiac Enzymes No results  for input(s): TROPONINI in the last 168 hours. RADIOLOGY:  No results found. ASSESSMENT AND PLAN:  Jeremy Padilla is a 54 y.o. male with medical history significant for diabetes mellitus, peripheral vascular disease status post recent left BKA which is done [facility], history of  hypertension who presents to the emergency room for evaluation of pain in his left BKA stump.  BKA stump gangrene/necrosis -Patient is status post recent 05/31/2020) left BKA for severe peripheral arterial disease and has complications following surgery with necrosis/gangrene -Vascular surgery has been consulted--plans for left AKA on Wednesday by  Dr Wyn Quaker -We will place patient empirically on vancomycin +Rocephin + flagyl --tolerating well -blood cultures negative so far -prn po dilaudid and oxycodone  Hypertension -Blood pressure stable -Continue amlodipine and lisinopril  Diabetes mellitus type 2, uncontrolled, hyperglycemia with severe PAD -Hold oral hypoglycemic agents -Maintain consistent carbohydrate diet -Glycemic control with sliding scale insulin and lantus 12 units qd  Hyponatremia -Most likely secondary to hydrochlorothiazide use and volume depletion from dehydration -Hold hydrochlorothiazide for now -IV fluid resuscitation  Severe PAD --s/p left BKA 05/31/2020 at [facility] -- take po eliquis--on hold for scheduled left AKA on wed  Dehydration/AKI -From poor oral intake -Patient has a baseline serum creatinine of 1.12 but today on admission it is 1.59--down to 1.2--1.01 -cont IVF   DVT prophylaxis: Lovenox Code Status: Full code Family Communication: Greater than 50% of time was spent discussing plan of care with patient at the bedside.  All questions and concerns have been addressed.  He verbalizes understanding and agrees with the plan. Disposition Plan: TBD--most likely home given no insurance and very limited options Per TOC--so far [facility] agency not able to offer a help Consults called: Vascular surgery  Status is: Inpatient  Remains inpatient appropriate because:Ongoing active pain requiring inpatient pain management   Dispo: The patient is from: Home              Anticipated d/c is to: Home              Anticipated d/c date is: > 3 days               Patient currently is not medically stable to d/c.  AKA for necrotic/gangrenous stump today   TOTAL TIME TAKING CARE OF THIS PATIENT: *25* minutes.  >50% time spent on counselling and coordination of care  Note: This dictation was prepared with Dragon dictation along with smaller phrase technology. Any transcriptional errors that result from this process are unintentional.  Enedina Finner M.D    [facility]   CC: Primary care physician; Toy Cookey, FNPPatient ID: Ralene Muskrat, male   DOB: Dec 01, 1966, 54 y.o.   MRN: [id number]

## 2020-06-23 NOTE — Transfer of Care (Signed)
Immediate Anesthesia Transfer of Care Note  Patient: Jeremy Padilla  Procedure(s) Performed: AMPUTATION ABOVE KNEE (Left Knee)  Patient Location: PACU  Anesthesia Type:General  Level of Consciousness: drowsy  Airway & Oxygen Therapy: Patient Spontanous Breathing and Patient connected to nasal cannula oxygen  Post-op Assessment: Report given to RN  Post vital signs: stable  Last Vitals:  Vitals Value Taken Time  BP 141/69 06/23/20 1139  Temp    Pulse 100 06/23/20 1141  Resp 21 06/23/20 1141  SpO2 100 % 06/23/20 1141  Vitals shown include unvalidated device data.  Last Pain:  Vitals:   06/23/20 1002  TempSrc: Temporal  PainSc: 7          Complications: No complications documented.

## 2020-06-23 NOTE — Anesthesia Postprocedure Evaluation (Signed)
Anesthesia Post Note  Patient: Jeremy Padilla  Procedure(s) Performed: AMPUTATION ABOVE KNEE (Left Knee)  Patient location during evaluation: PACU Anesthesia Type: General Level of consciousness: awake and alert Pain management: pain level controlled Vital Signs Assessment: post-procedure vital signs reviewed and stable Respiratory status: spontaneous breathing, nonlabored ventilation, respiratory function stable and patient connected to nasal cannula oxygen Cardiovascular status: blood pressure returned to baseline and stable Postop Assessment: no apparent nausea or vomiting Anesthetic complications: no   No complications documented.   Last Vitals:  Vitals:   06/23/20 1254 06/23/20 1312  BP: 136/66 (!) 142/74  Pulse: (!) 115 (!) 119  Resp: 15 16  Temp:  (!) 36.3 C  SpO2: 100% 97%    Last Pain:  Vitals:   06/23/20 1312  TempSrc: Oral  PainSc:                  Corinda Gubler

## 2020-06-23 NOTE — H&P (Signed)
[facility] History & Physical Update  The patient was interviewed and re-examined.  The patient's previous History and Physical has been reviewed and is unchanged.  There is no change in the plan of care. We plan to proceed with the scheduled procedure.  Festus Barren, MD  06/23/2020, 10:26 AM

## 2020-06-23 NOTE — Consult Note (Signed)
PV Navigator consult acknowledged. Chart and media photos reviewed from remote location.  54 y.o. male status post L BKA at [facility] hospital on 05/31/20. Chart reports he was discharged to home with [facility] services that came out 2 times however did not assess surgical site or change any dressings. L BKA site very painful and continued to decline. Pt presented to [facility] 06/21/20 with L BKA site infection and pain. Vascular services evaluation recommending revision and patient scheduled for L AKA 06/23/20 with Dr Wyn Quaker.   Multiple barriers including currently without insurance and difficulty obtaining prescriptions. Pt has applied for Medicaid and SSI - approval pending, TOC/SW team following. Malnourished secondary to social/enviromental circumstances- Nutritional consult in place. Elevated blood glucose levels- Diabetes coordinator following. Patient also recently lost his wife to COVID and is relying on his daughter and cousin for assistance/support. He does receive [employer] as well.   No recommendations at this time. Will continue to follow acutely and will reach out to patient's daughter to provide contact information should questions or additional barriers arise.  Thank you, Hilma Favors RN BSN CWS PV Navigator 

## 2020-06-23 NOTE — TOC Progression Note (Signed)
Transition of Care [facility]) - Progression Note    Patient Details  Name: Chaitanya Amedee MRN: [id number] Date of Birth: 08/13/1966  [facility] CM/SW Contact  Chapman Fitch, RN Phone Number: 06/23/2020, 2:45 PM  Clinical Narrative:    Received return call from cousin Bonita Quin.  She provided me with SSI case ID number [id number]. She is still working on getting in contact with Medicaid to get a pending number   Expected Discharge Plan: Home w Home Health Services Barriers to Discharge: Continued Medical Work up  Expected Discharge Plan and Services Expected Discharge Plan: Home w Home Health Services       Living arrangements for the past 2 months: Single Family Home                                       Social Determinants of Health (SDOH) Interventions    Readmission Risk Interventions No flowsheet data found.

## 2020-06-23 NOTE — Telephone Encounter (Signed)
Reaching out to patient's daughter to introduce myself and role. No answer. Will attempt again tomorrow.  Hilma Favors RN BSN CWS PV Navigator [place]

## 2020-06-23 NOTE — Progress Notes (Addendum)
   06/23/20 0835  Assess: MEWS Score  Temp 98.4 F (36.9 C)  BP (!) 170/78  Pulse Rate (!) 117  Resp 20  Level of Consciousness Alert  SpO2 100 %  O2 Device Room Air  Assess: MEWS Score  MEWS Temp 0  MEWS Systolic 0  MEWS Pulse 2  MEWS RR 0  MEWS LOC 0  MEWS Score 2  MEWS Score Color Yellow  Assess: if the MEWS score is Yellow or Red  Were vital signs taken at a resting state? Yes  Focused Assessment Documented focused assessment  Early Detection of Sepsis Score *See Row Information* Low  MEWS guidelines implemented *See Row Information* No, vital signs rechecked  Treat  MEWS Interventions Administered scheduled meds/treatments;Administered prn meds/treatments  Take Vital Signs  Increase Vital Sign Frequency  Yellow: Q 2hr X 2 then Q 4hr X 2, if remains yellow, continue Q 4hrs  Escalate  MEWS: Escalate Yellow: discuss with charge nurse/RN and consider discussing with provider and RRT  Notify: Charge Nurse/RN  Name of Charge Nurse/RN Notified Roe Rutherford, RN  Date Charge Nurse/RN Notified 06/23/20  Time Charge Nurse/RN Notified 7425  Notify: Provider  Provider Name/Title Enedina Finner, RN  Date Provider Notified 06/23/20  Time Provider Notified [phone]  Notification Type Face-to-face  Notification Reason Change in status  Response No new orders  Date of Provider Response 06/23/20  Time of Provider Response 0850  Notify: Rapid Response  Name of Rapid Response RN Notified  (Not contacted)  Document  Patient Outcome Not stable and remains on department  Progress note created (see row info) Yes  MD, charge nurse  And nursing assistant notified about Yellow MEWS score protocol continued at this time for BP and heart rate elevation along with pain. PRN and schedule meds, charge nurse  And nursing assistant notified about Yellow MEWS score protocol continued at this time for BP and heart rate elevation along with pain.

## 2020-06-23 NOTE — Anesthesia Procedure Notes (Signed)
Procedure Name: LMA Insertion Date/Time: 06/23/2020 10:31 AM Performed by: Jaye Beagle, CRNA Pre-anesthesia Checklist: Patient identified, Emergency Drugs available, Suction available, Patient being monitored and Timeout performed Patient Re-evaluated:Patient Re-evaluated prior to induction Oxygen Delivery Method: Circle system utilized Preoxygenation: Pre-oxygenation with 100% oxygen Induction Type: IV induction Ventilation: Mask ventilation without difficulty LMA: LMA inserted LMA Size: 4.5 Number of attempts: 1 Placement Confirmation: positive ETCO2 and breath sounds checked- equal and bilateral Tube secured with: Tape Dental Injury: Teeth and Oropharynx as per pre-operative assessment

## 2020-06-23 NOTE — Progress Notes (Addendum)
Patient being monitor

## 2020-06-23 NOTE — Op Note (Signed)
[facility]   OPERATIVE NOTE   PROCEDURE:  Left above-the-knee amputation  PRE-OPERATIVE DIAGNOSIS: Left foot gangrene, s/p left BKA at another institution which has gangrenous changes and a knee contracture and no ability to salvage at the below knee location  POST-OPERATIVE DIAGNOSIS: same as above  SURGEON:  Festus Barren, MD  ASSISTANT(S): Raul Del, PA-C  ANESTHESIA: general  ESTIMATED BLOOD LOSS: 50 cc  FINDING(S): none  SPECIMEN(S):  Left above-the-knee amputation  INDICATIONS:   Jeremy Padilla is a 54 y.o. male who presents with left BKA gangrene with a contracture and no ability to salvage the amputation at the below knee level.  The patient is scheduled for a left above-the-knee amputation.  I discussed in depth with the patient the risks, benefits, and alternatives to this procedure.  The patient is aware that the risk of this operation included but are not limited to:  bleeding, infection, myocardial infarction, stroke, death, failure to heal amputation wound, and possible need for more proximal amputation.  The patient is aware of the risks and agrees proceed forward with the procedure. An assistant was present during the procedure to help facilitate the exposure and expedite the procedure.  DESCRIPTION: After full informed written consent was obtained from the patient, the patient was taken to the operating room, and placed supine upon the operating table.  Prior to induction, the patient received IV antibiotics.  The patient was then prepped and draped in the standard fashion for a left above-the-knee amputation. The assistant provided retraction and mobilization to help facilitate exposure and expedite the procedure throughout the entire procedure.  This included following suture, using retractors, and optimizing lighting.  After obtaining adequate anesthesia, the patient was prepped and draped in the standard fashion for a above-the-knee  amputation.  I marked out the anterior and posterior flaps for a fish-mouth type of amputation.  I made the incisions for these flaps, and then dissected through the subcutaneous tissue, fascia, and muscles circumferentially.  I elevated  the periosteal tissue 4-5 cm more proximal than the anterior skin flap.  I then transected the femur with a power saw at this level.  Then I smoothed out the rough edges of the bone.  At this point, the specimen was passed off the field as the above-the-knee amputation.  At this point, I clamped all visibly bleeding arteries and veins using a combination of suture ligation with silk suture and electrocautery.   Bleeding continued to be controlled with electrocautery and suture ligature.  The stump was washed off with sterile normal saline and no further active bleeding was noted.  I reapproximated the anterior and posterior fascia  with interrupted stitches of 0 Vicryl.  This was completed along the entire length of anterior and posterior fascia until there were no more loose space in the fascial line. The subcutaneous tissue was then approximated with 2-0 vicryl sutures. The skin was then  reapproximated with staples.  The stump was washed off and dried.  The incision was dressed with Xeroform and ABD pads, and  then fluffs were applied.  Kerlix was wrapped around the leg and then gently an ACE wrap was applied.  A large Ioban was then placed over the ACE wrap to secure the dressing. The patient was then awakened and take to the recovery room in stable condition.   COMPLICATIONS: none  CONDITION: stable  Festus Barren  06/23/2020, 11:24 AM   This note was created with Dragon Medical transcription system.  Any errors in dictation are purely unintentional.

## 2020-06-24 ENCOUNTER — Encounter: Payer: Self-pay | Admitting: Vascular Surgery

## 2020-06-24 LAB — CBC
HCT: 22.2 % — ABNORMAL LOW (ref 39.0–52.0)
Hemoglobin: 7.3 g/dL — ABNORMAL LOW (ref 13.0–17.0)
MCH: 26.7 pg (ref 26.0–34.0)
MCHC: 32.9 g/dL (ref 30.0–36.0)
MCV: 81.3 fL (ref 80.0–100.0)
Platelets: 331 10*3/uL (ref 150–400)
RBC: 2.73 MIL/uL — ABNORMAL LOW (ref 4.22–5.81)
RDW: 13.3 % (ref 11.5–15.5)
WBC: 10.2 10*3/uL (ref 4.0–10.5)
nRBC: 0 % (ref 0.0–0.2)

## 2020-06-24 LAB — GLUCOSE, CAPILLARY
Glucose-Capillary: 150 mg/dL — ABNORMAL HIGH (ref 70–99)
Glucose-Capillary: 177 mg/dL — ABNORMAL HIGH (ref 70–99)
Glucose-Capillary: 216 mg/dL — ABNORMAL HIGH (ref 70–99)

## 2020-06-24 LAB — CREATININE, SERUM
Creatinine, Ser: 0.92 mg/dL (ref 0.61–1.24)
GFR calc Af Amer: >60 mL/min (ref >60)
GFR calc non Af Amer: >60 mL/min (ref >60)

## 2020-06-24 MED ORDER — KETOROLAC TROMETHAMINE 15 MG/ML IJ SOLN
15.0000 mg | Freq: Three times a day (TID) | INTRAMUSCULAR | Status: AC | PRN
  Administered 2020-06-24 – 2020-06-25 (×3): 15 mg via INTRAVENOUS
  Filled 2020-06-24 (×3): qty 1

## 2020-06-24 NOTE — Progress Notes (Signed)
[facility]  - [facility] [facility]   PATIENT NAME: Jeremy Padilla    MR#:  [id number]  DATE OF BIRTH:  05-30-1966  SUBJECTIVE:   Patient came in with significant pain foul-smelling and black necrotic area over his left below knee amputation stump. He recently underwent left BKA for severe peripheral arterial disease at [facility] on 05/31/2020  POD #1 left AKA --doing very well, no c/o pain working with PT REVIEW OF SYSTEMS:   Review of Systems  Constitutional: Negative for chills, fever and weight loss.  HENT: Negative for ear discharge, ear pain and nosebleeds.   Eyes: Negative for blurred vision, pain and discharge.  Respiratory: Negative for sputum production, shortness of breath, wheezing and stridor.   Cardiovascular: Negative for chest pain, palpitations, orthopnea and PND.  Gastrointestinal: Negative for abdominal pain, diarrhea, nausea and vomiting.  Genitourinary: Negative for frequency and urgency.  Musculoskeletal: Positive for joint pain. Negative for back pain.  Neurological: Positive for weakness. Negative for sensory change, speech change and focal weakness.  Psychiatric/Behavioral: Negative for depression and hallucinations. The patient is not nervous/anxious.    Tolerating Diet:yes Tolerating PT: pending  DRUG ALLERGIES:   Allergies  Allergen Reactions  . Penicillins Rash    .Has patient had a PCN reaction causing immediate rash, facial/tongue/throat swelling, SOB or lightheadedness with hypotension: Unknown Has patient had a PCN reaction causing severe rash involving mucus membranes or skin necrosis: Unknown Has patient had a PCN reaction that required hospitalization: Unknown Has patient had a PCN reaction occurring within the last 10 years: Unknown If all of the above answers are "NO", then may proceed with Cephalosporin use.     VITALS:  Blood pressure (!) 144/75, pulse (!) 101, temperature 98.3 F (36.8 C), resp. rate 18, height 6\' 2"   (1.88 m), weight 70.9 kg, SpO2 100 %.  PHYSICAL EXAMINATION:   Physical Exam  GENERAL:  54 y.o.-year-old patient lying in the bed with no acute distress.  EYES: Pupils equal, round, reactive to light and accommodation. No scleral icterus.   HEENT: Head atraumatic, normocephalic. Oropharynx and nasopharynx clear.  NECK:  Supple, no jugular venous distention. No thyroid enlargement, no tenderness.  LUNGS: Normal breath sounds bilaterally, no wheezing, rales, rhonchi. No use of accessory muscles of respiration.  CARDIOVASCULAR: S1, S2 normal. No murmurs, rubs, or gallops.  ABDOMEN: Soft, nontender, nondistended. Bowel sounds present. No organomegaly or mass.  EXTREMITIES: left LE on 06/22/2020   06/24/2020--stump dressing+ NEUROLOGIC: Cranial nerves II through XII are intact. No focal Motor or sensory deficits b/l.   PSYCHIATRIC:  patient is alert and oriented x 3.  SKIN: No obvious rash, lesion, or ulcer.  Pressure Injury 06/21/20 Sacrum Mid Stage 2 -  Partial thickness loss of dermis presenting as a shallow open injury with a red, pink wound bed without slough. pink (Active)  06/21/20 1900  Location: Sacrum  Location Orientation: Mid  Staging: Stage 2 -  Partial thickness loss of dermis presenting as a shallow open injury with a red, pink wound bed without slough.  Wound Description (Comments): pink  Present on Admission: Yes      LABORATORY PANEL:  CBC Recent Labs  Lab 06/24/20 0455  WBC 10.2  HGB 7.3*  HCT 22.2*  PLT 331    Chemistries  Recent Labs  Lab 06/23/20 0417 06/23/20 0417 06/24/20 0455  NA 135  --   --   K 4.1  --   --   CL 100  --   --  CO2 26  --   --   GLUCOSE 182*  --   --   BUN 28*  --   --   CREATININE 1.01   < > 0.92  CALCIUM 9.1  --   --   MG 1.9  --   --    < > = values in this interval not displayed.   Cardiac Enzymes No results for input(s): TROPONINI in the last 168 hours. RADIOLOGY:  No results found. ASSESSMENT AND PLAN:  Jeremy Padilla is a 54 y.o. male with medical history significant for diabetes mellitus, peripheral vascular disease status post recent left BKA which is done [facility], history of hypertension who presents to the emergency room for evaluation of pain in his left BKA stump.  BKA stump gangrene/necrosis -Patient is status post recent 05/31/2020) left BKA for severe peripheral arterial disease and has complications following surgery with necrosis/gangrene -7/14>>Vascular surgery has been consulted--POD #1s/p left AKA  by  Dr Wyn Quaker - d/c IV empiric abxs--d/w vascular -blood cultures negative so far -prn po pain meds  Hypertension -Blood pressure stable -Continue amlodipine and lisinopril  Diabetes mellitus type 2, uncontrolled, hyperglycemia with severe PAD -Maintain consistent carbohydrate diet -Glycemic control with sliding scale insulin and lantus 12 units qd  Hyponatremia -Most likely secondary to hydrochlorothiazide use and volume depletion from dehydration--resolved -Hold hydrochlorothiazide for now  Severe PAD --s/p left BKA 05/31/2020 at [facility] --was  taking po eliquis started at [facility] after left BKA 06/05/2020  --cont asa--no indication for eliquis per Vascular sx  Dehydration/AKI -resolved -Patient has a baseline serum creatinine of 1.12 but today on admission it is 1.59--down to 1.2--1.01   DVT prophylaxis: Lovenox Code Status: Full code Family Communication: Greater than 50% of time was spent discussing plan of care with patient at the bedside.  All questions and concerns have been addressed.  He verbalizes understanding and agrees with the plan. Disposition Plan: TBD--most likely home given no insurance and very limited options  TOC--for d/c planning Consults called: Vascular surgery  Status is: Inpatient  Remains inpatient appropriate because pt needs PT eval POD #1, TOC to work with family for d/c planning  Dispo: The patient is from: Home               Anticipated d/c is to: Home              Anticipated d/c date is: TBD              Patient currently is not medically stable to d/c.  As above   TOTAL TIME TAKING CARE OF THIS PATIENT: *25* minutes.  >50% time spent on counselling and coordination of care  Note: This dictation was prepared with Dragon dictation along with smaller phrase technology. Any transcriptional errors that result from this process are unintentional.  Enedina Finner M.D    [facility]   CC: Primary care physician; Toy Cookey, FNPPatient ID: Ralene Muskrat, male   DOB: 1966-06-18, 54 y.o.   MRN: [id number]

## 2020-06-24 NOTE — Evaluation (Addendum)
Physical Therapy Evaluation Patient Details Name: Jeremy Padilla MRN: [id number] DOB: 03-23-1966 Today's Date: 06/24/2020   History of Present Illness   54 y.o. male with medical history significant for diabetes mellitus, peripheral vascular disease status post recent left BKA 6/21, tissue went necrotic and ultimately he had AKA 7/14.  Clinical Impression  Pt eager and motivated t/o the session but was generally hesitant about doing much mobility, standing, etc.  He ultimately did better than he expected and was able to ambulate ~20 ft with slow but relatively safe hop-to cadence.  Adjusted walker to appropriate height and educated on the appropriate way to do this at home, also did some trouble-shooting about managing in the home and educated on positioning/some HEP for continued improvement post AKA.  He reports he has good family support and family does regularly check in, though he is alone most of the time.       Follow Up Recommendations Home health PT;Supervision - Intermittent    Equipment Recommendations  None recommended by PT (pt has acquired DME in the last month)    Recommendations for Other Services       Precautions / Restrictions Precautions Precautions: Fall Restrictions Weight Bearing Restrictions: Yes Other Position/Activity Restrictions: acute R AKA      Mobility  Bed Mobility Overal bed mobility: Modified Independent             General bed mobility comments: Pt showed good ability to get to EOB w/o assist, good confidence  Transfers Overall transfer level: Modified independent Equipment used: Rolling walker (2 wheeled)   Pt was able to transfer independently and confidently from bed to recliner (arm rest dropped) States he has been consistently getting into his rollator at home relatively easily over the last month, but is eager to try to start using newly acquired w/c when he goes home.             General transfer comment: Pt initially  hesitant to try standing/walking as has done little since 6/21 BKA, with cuing and encouragement however he was able to rise to standing w/o direct assist 2x t/o the session  Ambulation/Gait Ambulation/Gait assistance: Min guard Gait Distance (Feet): 18 Feet Assistive device: Rolling walker (2 wheeled)       General Gait Details: Pt was able to manage in-room ambulation with safe hop-to ambulation.  He did have, expected, UE fatigue with the effort but had no overt safety issues.    Stairs            Wheelchair Mobility    Modified Rankin (Stroke Patients Only)       Balance Overall balance assessment: Modified Independent                                           Pertinent Vitals/Pain Pain Assessment:  (reports only minimal expected post-op pain)    Home Living Family/patient expects to be discharged to:: Private residence Living Arrangements: Alone (lost wife in April, family has been supportive) Available Help at Discharge: Family   Home Access: Ramped entrance     Home Layout: One level Home Equipment: Environmental consultant - 2 wheels;Walker - 4 wheels;Bedside commode;Wheelchair - manual      Prior Function Level of Independence: Needs assistance         Comments: Pt has been quite functionally limited      Hand Dominance  Extremity/Trunk Assessment   Upper Extremity Assessment Upper Extremity Assessment: Overall WFL for tasks assessed;Generalized weakness    Lower Extremity Assessment Lower Extremity Assessment: Generalized weakness (Pt able to reach neutral hip extension, AROM in all planes)       Communication   Communication: No difficulties  Cognition Arousal/Alertness: Awake/alert Behavior During Therapy: WFL for tasks assessed/performed Overall Cognitive Status: Within Functional Limits for tasks assessed                                        General Comments      Exercises     Assessment/Plan     PT Assessment Patient needs continued PT services  PT Problem List Decreased strength;Decreased range of motion;Decreased activity tolerance;Decreased balance;Decreased mobility;Decreased knowledge of use of DME;Decreased safety awareness;Decreased knowledge of precautions;Cardiopulmonary status limiting activity       PT Treatment Interventions DME instruction;Gait training;Functional mobility training;Therapeutic activities;Therapeutic exercise;Balance training;Neuromuscular re-education;Patient/family education;Wheelchair mobility training    PT Goals (Current goals can be found in the Care Plan section)  Acute Rehab PT Goals Patient Stated Goal: Pt hoping to be able to go home and manage safely and insure residual limb stays healthy. PT Goal Formulation: With patient Time For Goal Achievement: 07/08/20 Potential to Achieve Goals: Good    Frequency 7X/week   Barriers to discharge        Co-evaluation               AM-PAC PT "6 Clicks" Mobility  Outcome Measure Help needed turning from your back to your side while in a flat bed without using bedrails?: None Help needed moving from lying on your back to sitting on the side of a flat bed without using bedrails?: None Help needed moving to and from a bed to a chair (including a wheelchair)?: A Little Help needed standing up from a chair using your arms (e.g., wheelchair or bedside chair)?: A Little Help needed to walk in hospital room?: A Little Help needed climbing 3-5 steps with a railing? : A Lot 6 Click Score: 19    End of Session Equipment Utilized During Treatment: Gait belt Activity Tolerance: Patient tolerated treatment well Patient left: with call bell/phone within reach;with chair alarm set   PT Visit Diagnosis: Muscle weakness (generalized) (M62.81);Difficulty in walking, not elsewhere classified (R26.2)    Time: 0762-2633 PT Time Calculation (min) (ACUTE ONLY): 54 min   Charges:   PT Evaluation $PT Eval  Moderate Complexity: 1 Mod PT Treatments $Therapeutic Activity: 23-37 mins        Malachi Pro, DPT 06/24/2020, 1:27 PM

## 2020-06-24 NOTE — Progress Notes (Signed)
[facility] Daily Progress Note  Subjective: 06/23/20:  Left above-the-knee amputation  Patient without complaint. With physical therapy today.  Objective: Vitals:   06/23/20 1629 06/23/20 1955 06/24/20 0409 06/24/20 1157  BP: 136/80 128/70 (!) 144/75 113/67  Pulse: (!) 108 99 (!) 101 (!) 105  Resp: 16 18 18    Temp: (!) 97.4 F (36.3 C) 98.4 F (36.9 C) 98.3 F (36.8 C) 98 F (36.7 C)  TempSrc: Oral     SpO2: 98% 100% 100% 97%  Weight:      Height:        Intake/Output Summary (Last 24 hours) at 06/24/2020 1523 Last data filed at 06/24/2020 1025 Gross per 24 hour  Intake 2271.52 ml  Output 1000 ml  Net 1271.52 ml   Physical Exam: A&Ox3, NAD CV: RRR Pulmonary: CTA Bilaterally Abdomen: Soft, Nontender, Nondistended Vascular:  Left lower extremity: Thigh soft. OR dressing clean dry and intact.   Laboratory: CBC    Component Value Date/Time   WBC 10.2 06/24/2020 0455   HGB 7.3 (L) 06/24/2020 0455   HCT 22.2 (L) 06/24/2020 0455   PLT 331 06/24/2020 0455   BMET    Component Value Date/Time   NA 135 06/23/2020 0417   K 4.1 06/23/2020 0417   CL 100 06/23/2020 0417   CO2 26 06/23/2020 0417   GLUCOSE 182 (H) 06/23/2020 0417   BUN 28 (H) 06/23/2020 0417   CREATININE 0.92 06/24/2020 0455   CALCIUM 9.1 06/23/2020 0417   GFRNONAA >60 06/24/2020 0455   GFRAA >60 06/24/2020 0455   Assessment/Planning: The patient is a 54 year old male with known history of peripheral artery disease status post left below the knee amputation at [facility] approximately 3 weeks ago with stump failure now left above-the-knee amputation - POD#1  1) Hbg: 7.3 - AM CBC 2) Will plan on OR dressing change on Saturday. 3) If patient continues to progress as well his he has postoperatively plan discharge home on Saturday after OR dressing change 4) Okay to discontinue Eliquis. Start aspirin 81 mg PO daily.  Discussed with Dr. Monday Ursula Dermody PA-C 06/24/2020 3:23  PM

## 2020-06-25 LAB — BPAM RBC
Blood Product Expiration Date: 202108062359
Blood Product Expiration Date: 202108062359
Unit Type and Rh: 5100
Unit Type and Rh: 5100

## 2020-06-25 LAB — CBC
HCT: 21.6 % — ABNORMAL LOW (ref 39.0–52.0)
Hemoglobin: 7.3 g/dL — ABNORMAL LOW (ref 13.0–17.0)
MCH: 26.9 pg (ref 26.0–34.0)
MCHC: 33.8 g/dL (ref 30.0–36.0)
MCV: 79.7 fL — ABNORMAL LOW (ref 80.0–100.0)
Platelets: 339 10*3/uL (ref 150–400)
RBC: 2.71 MIL/uL — ABNORMAL LOW (ref 4.22–5.81)
RDW: 13.3 % (ref 11.5–15.5)
WBC: 7.8 10*3/uL (ref 4.0–10.5)
nRBC: 0 % (ref 0.0–0.2)

## 2020-06-25 LAB — TYPE AND SCREEN
ABO/RH(D): O POS
Antibody Screen: NEGATIVE
Unit division: 0
Unit division: 0

## 2020-06-25 LAB — BASIC METABOLIC PANEL
Anion gap: 9 (ref 5–15)
BUN: 26 mg/dL — ABNORMAL HIGH (ref 6–20)
CO2: 23 mmol/L (ref 22–32)
Calcium: 8.4 mg/dL — ABNORMAL LOW (ref 8.9–10.3)
Chloride: 105 mmol/L (ref 98–111)
Creatinine, Ser: 0.83 mg/dL (ref 0.61–1.24)
GFR calc Af Amer: >60 mL/min (ref >60)
GFR calc non Af Amer: >60 mL/min (ref >60)
Glucose, Bld: 138 mg/dL — ABNORMAL HIGH (ref 70–99)
Potassium: 3.6 mmol/L (ref 3.5–5.1)
Sodium: 137 mmol/L (ref 135–145)

## 2020-06-25 LAB — GLUCOSE, CAPILLARY
Glucose-Capillary: 127 mg/dL — ABNORMAL HIGH (ref 70–99)
Glucose-Capillary: 135 mg/dL — ABNORMAL HIGH (ref 70–99)
Glucose-Capillary: 176 mg/dL — ABNORMAL HIGH (ref 70–99)
Glucose-Capillary: 144 mg/dL — ABNORMAL HIGH (ref 70–99)

## 2020-06-25 LAB — PREPARE RBC (CROSSMATCH)

## 2020-06-25 MED ORDER — FERROUS SULFATE 325 (65 FE) MG PO TABS
325.0000 mg | ORAL_TABLET | Freq: Two times a day (BID) | ORAL | Status: DC
  Administered 2020-06-25 – 2020-06-26 (×3): 325 mg via ORAL
  Filled 2020-06-25 (×3): qty 1

## 2020-06-25 NOTE — TOC Progression Note (Signed)
Transition of Care [facility]) - Progression Note    Patient Details  Name: Jeremy Padilla MRN: [id number] Date of Birth: 12-Aug-1966  [facility]) CM/SW Contact  Chapman Fitch, RN Phone Number: 06/25/2020, 11:34 AM  Clinical Narrative:      PT has assessed patient and recommends home health.  Patient and cousin Bonita Quin aware.   Rosey Bath with [facility] has confirmed that patient has been accepted for RN, PT and OT. Rosey Bath notified that anticipated discharge date is for 06/26/20  Patient has all needed DME at home  Bonita Quin confirms that patient has insulin, Gabapentin, Dual action advil, and BP meds at home.  Per MD patient will not require any new medications at discharge.  If patient does have new medications, may need medication assistance   Expected Discharge Plan: Home w Home Health Services Barriers to Discharge: Continued Medical Work up  Expected Discharge Plan and Services Expected Discharge Plan: Home w Home Health Services       Living arrangements for the past 2 months: Single Family Home                           HH Arranged: RN, PT, OT HH Agency: [facility] (formerly [facility]) Date HH Agency Contacted: 06/24/20   Representative spoke with at [facility] Agency: Rosey Bath   Social Determinants of Health (SDOH) Interventions    Readmission Risk Interventions No flowsheet data found.

## 2020-06-25 NOTE — Progress Notes (Signed)
[facility] Daily Progress Note   Subjective: 06/23/20: Left above-the-knee amputation  Patient without complaints this AM.  No issues overnight.  Continues to work with physical therapy.  Objective: Vitals:   06/24/20 0409 06/24/20 1157 06/24/20 1922 06/25/20 0320  BP: (!) 144/75 113/67 129/68 (!) 152/71  Pulse: (!) 101 (!) 105 98 (!) 103  Resp: 18  18 18   Temp: 98.3 F (36.8 C) 98 F (36.7 C) 98.3 F (36.8 C) 98 F (36.7 C)  TempSrc:   Oral Oral  SpO2: 100% 97% 100% 100%  Weight:      Height:        Intake/Output Summary (Last 24 hours) at 06/25/2020 1043 Last data filed at 06/25/2020 0956 Gross per 24 hour  Intake 600 ml  Output 1125 ml  Net -525 ml   Physical Exam: A&Ox3, NAD CV: RRR Pulmonary: CTA Bilaterally Abdomen: Soft, Nontender, Nondistended Vascular:             Left lower extremity: Thigh soft. OR dressing clean dry and intact.   Laboratory: CBC    Component Value Date/Time   WBC 7.8 06/25/2020 0416   HGB 7.3 (L) 06/25/2020 0416   HCT 21.6 (L) 06/25/2020 0416   PLT 339 06/25/2020 0416   BMET    Component Value Date/Time   NA 137 06/25/2020 0416   K 3.6 06/25/2020 0416   CL 105 06/25/2020 0416   CO2 23 06/25/2020 0416   GLUCOSE 138 (H) 06/25/2020 0416   BUN 26 (H) 06/25/2020 0416   CREATININE 0.83 06/25/2020 0416   CALCIUM 8.4 (L) 06/25/2020 0416   GFRNONAA >60 06/25/2020 0416   GFRAA >60 06/25/2020 0416   Assessment/Planning: The patient is a 54 year old male with known history of peripheral artery disease status post left below the knee amputation at [facility] approximately 3 weeks ago with stump failure now left above-the-knee amputation - POD#2  1) Hbg: 7.3 - Stable x2 days. On iron supplement. 2) Will plan on OR dressing change on Saturday. 3) If patient continues to progress as well his he has postoperatively plan discharge home on Saturday after OR dressing change 4) Okay to discontinue Eliquis. Start aspirin 81 mg  PO daily.  Discussed with Dr. Wednesday Michio Thier PA-C 06/25/2020 10:43 AM

## 2020-06-25 NOTE — Progress Notes (Signed)
[facility]  - [place] at [facility]   PATIENT NAME: Jeremy Padilla    MR#:  [id number]  DATE OF BIRTH:  1966/01/08  SUBJECTIVE:   Patient came in with significant pain foul-smelling and black necrotic area over his left below knee amputation stump. He recently underwent left BKA for severe peripheral arterial disease at [facility] on 05/31/2020  POD # 2 left AKA --doing very well, no c/o pain working with PT REVIEW OF SYSTEMS:   Review of Systems  Constitutional: Negative for chills, fever and weight loss.  HENT: Negative for ear discharge, ear pain and nosebleeds.   Eyes: Negative for blurred vision, pain and discharge.  Respiratory: Negative for sputum production, shortness of breath, wheezing and stridor.   Cardiovascular: Negative for chest pain, palpitations, orthopnea and PND.  Gastrointestinal: Negative for abdominal pain, diarrhea, nausea and vomiting.  Genitourinary: Negative for frequency and urgency.  Musculoskeletal: Positive for joint pain. Negative for back pain.  Neurological: Positive for weakness. Negative for sensory change, speech change and focal weakness.  Psychiatric/Behavioral: Negative for depression and hallucinations. The patient is not nervous/anxious.    Tolerating Diet:yes Tolerating PT: HHPT  DRUG ALLERGIES:   Allergies  Allergen Reactions  . Penicillins Rash    .Has patient had a PCN reaction causing immediate rash, facial/tongue/throat swelling, SOB or lightheadedness with hypotension: Unknown Has patient had a PCN reaction causing severe rash involving mucus membranes or skin necrosis: Unknown Has patient had a PCN reaction that required hospitalization: Unknown Has patient had a PCN reaction occurring within the last 10 years: Unknown If all of the above answers are "NO", then may proceed with Cephalosporin use.  Patient Tolerates Cephalosporins     VITALS:  Blood pressure (!) 152/71, pulse (!) 103, temperature 98 F (36.7 C),  temperature source Oral, resp. rate 18, height 6\' 2"  (1.88 m), weight 70.9 kg, SpO2 100 %.  PHYSICAL EXAMINATION:   Physical Exam  GENERAL:  54 y.o.-year-old patient lying in the bed with no acute distress.  EYES: Pupils equal, round, reactive to light and accommodation. No scleral icterus.   HEENT: Head atraumatic, normocephalic. Oropharynx and nasopharynx clear.  NECK:  Supple, no jugular venous distention. No thyroid enlargement, no tenderness.  LUNGS: Normal breath sounds bilaterally, no wheezing, rales, rhonchi. No use of accessory muscles of respiration.  CARDIOVASCULAR: S1, S2 normal. No murmurs, rubs, or gallops.  ABDOMEN: Soft, nontender, nondistended. Bowel sounds present. No organomegaly or mass.  EXTREMITIES: left LE on 06/22/2020   06/24/2020--stump dressing+  NEUROLOGIC: Cranial nerves II through XII are intact. No focal Motor or sensory deficits b/l.   PSYCHIATRIC:  patient is alert and oriented x 3.  SKIN: No obvious rash, lesion, or ulcer.  Pressure Injury 06/21/20 Sacrum Mid Stage 2 -  Partial thickness loss of dermis presenting as a shallow open injury with a red, pink wound bed without slough. pink (Active)  06/21/20 1900  Location: Sacrum  Location Orientation: Mid  Staging: Stage 2 -  Partial thickness loss of dermis presenting as a shallow open injury with a red, pink wound bed without slough.  Wound Description (Comments): pink  Present on Admission: Yes      LABORATORY PANEL:  CBC Recent Labs  Lab 06/25/20 0416  WBC 7.8  HGB 7.3*  HCT 21.6*  PLT 339    Chemistries  Recent Labs  Lab 06/23/20 0417 06/24/20 0455 06/25/20 0416  NA 135  --  137  K 4.1  --  3.6  CL  100  --  105  CO2 26  --  23  GLUCOSE 182*  --  138*  BUN 28*  --  26*  CREATININE 1.01   < > 0.83  CALCIUM 9.1  --  8.4*  MG 1.9  --   --    < > = values in this interval not displayed.   Cardiac Enzymes No results for input(s): TROPONINI in the last 168 hours. RADIOLOGY:  No  results found. ASSESSMENT AND PLAN:  Jeremy Padilla is a 54 y.o. male with medical history significant for diabetes mellitus, peripheral vascular disease status post recent left BKA which is done [facility], history of hypertension who presents to the emergency room for evaluation of pain in his left BKA stump.  BKA stump gangrene/necrosis -Patient is status post recent 05/31/2020) left BKA for severe peripheral arterial disease and has complications following surgery with necrosis/gangrene -7/14>>Vascular surgery has been consulted--POD # 2 s/p left AKA  by  Dr Wyn Quaker -blood cultures negative so far -prn po pain meds -7/16>> tolerating PT well. Dressing change in OR 7/17  Anemia--Iron def Start po iron pills Check iron studies and B12  Hypertension -Blood pressure stable -Continue amlodipine and lisinopril  Diabetes mellitus type 2, uncontrolled, mild hyperglycemia with severe PAD -Maintain consistent carbohydrate diet -Glycemic control with sliding scale insulin and lantus 12 units qd  Hyponatremia -Most likely secondary to hydrochlorothiazide use and volume depletion from dehydration--resolved -Hold hydrochlorothiazide for now  Severe PAD --s/p left BKA 05/31/2020 at [facility] --was  taking po eliquis started at [facility] after left BKA 06/05/2020  --cont asa--no indication for eliquis per Vascular sx   Dehydration/AKI -resolved -Patient has a baseline serum creatinine of 1.12 but today on admission it is 1.59--down to 1.2--1.01   DVT prophylaxis: Lovenox Code Status: Full code Family Communication: Greater than 50% of time was spent discussing plan of care with patient at the bedside.  All questions and concerns have been addressed.  He verbalizes understanding and agrees with the plan. Disposition Plan: TBD--most likely home given no insurance and very limited options  TOC--for d/c planning to home with HHPT likely 7/17 Consults called: Vascular surgery  Status is:  Inpatient  Remains inpatient appropriate because pt needs PT eval POD #2, TOC to work with family for d/c planning -need dressing change in OR 7/17  Dispo: The patient is from: Home              Anticipated d/c is to: Home              Anticipated d/c date is: 7/17 with [facility]              Patient currently is stable  As above   TOTAL TIME TAKING CARE OF THIS PATIENT: *20* minutes.  >50% time spent on counselling and coordination of care  Note: This dictation was prepared with Dragon dictation along with smaller phrase technology. Any transcriptional errors that result from this process are unintentional.  Enedina Finner M.D    [facility]   CC: Primary care physician; Toy Cookey, FNPPatient ID: Jeremy Padilla, male   DOB: Dec 31, 1965, 54 y.o.   MRN: [id number]

## 2020-06-25 NOTE — Progress Notes (Signed)
Physical Therapy Treatment Patient Details Name: Jeremy Padilla MRN: [id number] DOB: 04/17/1966 Today's Date: 06/25/2020    History of Present Illness  54 y.o. male with medical history significant for diabetes mellitus, peripheral vascular disease status post recent left BKA 6/21, tissue went necrotic and ultimately he had AKA 7/14.    PT Comments    Pt with improved confidence with both in bed and out of bed mobility this date.  He still fatigued both UE and CV (HR to nearly 140, pt sweating with the effort) during ambulation but was able to triple the distance from yesterday; able to ambulate with guarded but mostly safe cadence.  Reviewed the importance of positioning and maintaining ROM/flexibility with aim at future ambulation using prosthetic, etc.  Pt showed good strength with exercises but was very tender to any palpation of the L residual limb but good effort despite this.     Follow Up Recommendations  Home health PT;Supervision - Intermittent     Equipment Recommendations  None recommended by PT    Recommendations for Other Services       Precautions / Restrictions Precautions Precautions: Fall Restrictions Weight Bearing Restrictions: Yes Other Position/Activity Restrictions: acute R AKA    Mobility  Bed Mobility Overal bed mobility: Modified Independent             General bed mobility comments: Pt again with good confidence and efficiency with mobility in bed, able to easily roll to side to do some hip extension work  Transfers Overall transfer level: Modified independent Equipment used: Rolling walker (2 wheeled) Transfers: Sit to/from [employer] to Stand: Min guard         General transfer comment: plenty of cuing again as pt had some hesitancy and fear about the transition again.  Once clear on sequencing he was able to rise w/o direct assist but again shows general caution and needs considerably  encouragement/reinforcement  Ambulation/Gait Ambulation/Gait assistance: Min guard;Min assist Gait Distance (Feet): 65 Feet Assistive device: Rolling walker (2 wheeled)       General Gait Details: Pt continues to have some hesitancy but was able to ambulate with appropriate hop-to cadence in to the hallway without direct assist.  Pt very fatigued with the effort, HR up to the 130s and pt sweating.   Stairs             Wheelchair Mobility    Modified Rankin (Stroke Patients Only)       Balance Overall balance assessment: Modified Independent                                          Cognition Arousal/Alertness: Awake/alert Behavior During Therapy: WFL for tasks assessed/performed Overall Cognitive Status: Within Functional Limits for tasks assessed                                        Exercises Amputee Exercises Gluteal Sets: Strengthening;10 reps Hip Extension: Strengthening;10 reps Hip ABduction/ADduction: Strengthening;10 reps (tolerates only minimal resistance 2/2 stump soreness) Hip Flexion/Marching: Strengthening;10 reps Side lying L hip extensions edu and multiple reps with gentle overpressure into minimal hip extension   General Comments General comments (skin integrity, edema, etc.): Pt continues to need a lot of encouragement and reinforcement as he is hesitant to trust himself with most  standing activities      Pertinent Vitals/Pain Pain Assessment:  (soreness t/o residual limb, quite tender even 10" proximally)    Home Living                      Prior Function            PT Goals (current goals can now be found in the care plan section) Progress towards PT goals: Progressing toward goals    Frequency    7X/week      PT Plan Current plan remains appropriate    Co-evaluation              AM-PAC PT "6 Clicks" Mobility   Outcome Measure  Help needed turning from your back to your side  while in a flat bed without using bedrails?: None Help needed moving from lying on your back to sitting on the side of a flat bed without using bedrails?: None Help needed moving to and from a bed to a chair (including a wheelchair)?: A Little Help needed standing up from a chair using your arms (e.g., wheelchair or bedside chair)?: A Little Help needed to walk in hospital room?: A Little Help needed climbing 3-5 steps with a railing? : A Lot 6 Click Score: 19    End of Session Equipment Utilized During Treatment: Gait belt Activity Tolerance: Patient tolerated treatment well Patient left: with chair alarm set;with call bell/phone within reach Nurse Communication: Mobility status PT Visit Diagnosis: Muscle weakness (generalized) (M62.81);Difficulty in walking, not elsewhere classified (R26.2)     Time: 2671-2458 PT Time Calculation (min) (ACUTE ONLY): 40 min  Charges:  $Gait Training: 8-22 mins $Therapeutic Exercise: 8-22 mins $Therapeutic Activity: 8-22 mins                     Malachi Pro, DPT 06/25/2020, 3:02 PM

## 2020-06-25 NOTE — Discharge Instructions (Signed)
Vascular Surgery Discharge Instructions  1) Xeroform to the staple line.  Cover with ABD.  Cover with Kerlix.  Cover with Ace bandage.

## 2020-06-26 LAB — GLUCOSE, CAPILLARY
Glucose-Capillary: 161 mg/dL — ABNORMAL HIGH (ref 70–99)
Glucose-Capillary: 217 mg/dL — ABNORMAL HIGH (ref 70–99)

## 2020-06-26 LAB — CULTURE, BLOOD (ROUTINE X 2)
Culture: NO GROWTH
Culture: NO GROWTH
Special Requests: ADEQUATE
Special Requests: ADEQUATE

## 2020-06-26 MED ORDER — OXYCODONE HCL 5 MG PO TABS: 5.0000 mg | ORAL_TABLET | Freq: Four times a day (QID) | ORAL | 0 refills | Status: DC | PRN

## 2020-06-26 MED ORDER — GLIPIZIDE ER 10 MG PO TB24
10.0000 mg | ORAL_TABLET | Freq: Every day | ORAL | Status: DC
  Administered 2020-06-26: 10 mg via ORAL
  Filled 2020-06-26: qty 1

## 2020-06-26 MED ORDER — OCUVITE-LUTEIN PO CAPS: 1.0000 | ORAL_CAPSULE | Freq: Every day | ORAL | 0 refills | Status: DC

## 2020-06-26 MED ORDER — INSULIN GLARGINE 100 UNIT/ML ~~LOC~~ SOLN
8.0000 [IU] | Freq: Every day | SUBCUTANEOUS | Status: DC
  Filled 2020-06-26: qty 0.08

## 2020-06-26 MED ORDER — FERROUS SULFATE 325 (65 FE) MG PO TABS: 325.0000 mg | ORAL_TABLET | Freq: Two times a day (BID) | ORAL | 3 refills | Status: DC

## 2020-06-26 MED ORDER — ASCORBIC ACID 250 MG PO TABS: 250.0000 mg | ORAL_TABLET | Freq: Two times a day (BID) | ORAL | 0 refills | Status: DC

## 2020-06-26 MED ORDER — OXYCODONE HCL 5 MG PO TABS
5.0000 mg | ORAL_TABLET | ORAL | Status: DC | PRN
  Administered 2020-06-26: 5 mg via ORAL
  Filled 2020-06-26: qty 1

## 2020-06-26 NOTE — Progress Notes (Signed)
Physical Therapy Treatment Patient Details Name: Jeremy Padilla MRN: [id number] DOB: 1966/07/08 Today's Date: 06/26/2020    History of Present Illness  54 y.o. male with medical history significant for diabetes mellitus, peripheral vascular disease status post recent left BKA 6/21, tissue went necrotic and ultimately he had AKA 7/14.    PT Comments    Pt ready for session.  DC planned for today.  Stood from chair and is able to hop-to pat pattern to door and back.  Min verbal cues for safety with gait but overall limited by fatigue.  Sisters in room and discussed discharge and safety with pt.  Practiced transfers bed<-> chair with walker and supervision.  Pt encouraged to limit mobility to transfers with walker when alone and to practice gait with RW (not rollator) when family or caregivers are there for safety.  Primary mobility should be in wheelchair at home.  Voiced understanding.  Gait belt given for safety.  Ramp to access home so stair training not completed.  Pt and family comfortable with discharge plan.   Follow Up Recommendations  Home health PT;Supervision - Intermittent     Equipment Recommendations  None recommended by PT    Recommendations for Other Services       Precautions / Restrictions Precautions Precautions: Fall Restrictions Weight Bearing Restrictions: Yes LLE Weight Bearing: Non weight bearing Other Position/Activity Restrictions: acute R AKA    Mobility  Bed Mobility               General bed mobility comments: in chair before and after  Transfers Overall transfer level: Needs assistance Equipment used: Rolling walker (2 wheeled) Transfers: Sit to/from Stand Sit to Stand: Supervision         General transfer comment: more confident today.  mod I initially, supervision when fatigued  Ambulation/Gait Ambulation/Gait assistance: Min guard;Min assist Gait Distance (Feet): 40 Feet Assistive device: Rolling walker (2 wheeled) Gait  Pattern/deviations: Step-to pattern Gait velocity: decreased   General Gait Details: limited gait to in room,  generally steady.  min cues not to hop too far into walker box. limited by fatigue   Stairs             Wheelchair Mobility    Modified Rankin (Stroke Patients Only)       Balance Overall balance assessment: Needs assistance Sitting-balance support: Feet supported Sitting balance-Leahy Scale: Normal     Standing balance support: Bilateral upper extremity supported Standing balance-Leahy Scale: Good Standing balance comment: good balance for transfers,  encouraged +1 assist for gait in home when family is available                            Cognition Arousal/Alertness: Awake/alert Behavior During Therapy: WFL for tasks assessed/performed Overall Cognitive Status: Within Functional Limits for tasks assessed                                        Exercises      General Comments        Pertinent Vitals/Pain Pain Assessment: Faces Faces Pain Scale: Hurts a little bit Pain Location: L LE Pain Descriptors / Indicators: Aching;Sore Pain Intervention(s): Limited activity within patient's tolerance;Monitored during session    Home Living                      Prior  Function            PT Goals (current goals can now be found in the care plan section) Progress towards PT goals: Progressing toward goals    Frequency    7X/week      PT Plan Current plan remains appropriate    Co-evaluation              AM-PAC PT "6 Clicks" Mobility   Outcome Measure  Help needed turning from your back to your side while in a flat bed without using bedrails?: None Help needed moving from lying on your back to sitting on the side of a flat bed without using bedrails?: None Help needed moving to and from a bed to a chair (including a wheelchair)?: A Little Help needed standing up from a chair using your arms (e.g.,  wheelchair or bedside chair)?: A Little Help needed to walk in hospital room?: A Little Help needed climbing 3-5 steps with a railing? : A Lot 6 Click Score: 19    End of Session Equipment Utilized During Treatment: Gait belt Activity Tolerance: Patient tolerated treatment well Patient left: with chair alarm set;with call bell/phone within reach Nurse Communication: Mobility status PT Visit Diagnosis: Muscle weakness (generalized) (M62.81);Difficulty in walking, not elsewhere classified (R26.2)     Time: 1856-3149 PT Time Calculation (min) (ACUTE ONLY): 25 min  Charges:  $Gait Training: 8-22 mins $Therapeutic Activity: 8-22 mins                    Danielle Dess, PTA 06/26/20, 11:06 AM

## 2020-06-26 NOTE — Progress Notes (Signed)
Left AKA dressing changed Incision clean and dry dressing replaced.  Sister educated on dressing change Stable for discharge from vascular standpoint   Jeremy Padilla

## 2020-06-26 NOTE — TOC Transition Note (Signed)
Transition of Care [facility]) - CM/SW Discharge Note   Patient Details  Name: Jeremy Padilla MRN: [id number] Date of Birth: 04-Jun-1966  Transition of Care [facility]) CM/SW Contact:  Luvenia Redden, RN Phone Number:[phone] 06/26/2020, 9:13 AM   Clinical Narrative:     Pt will be discharged today with [facility] services in the home. Pt will be transported by family home today. Family has requested a contact number that was provided [facility] contact number if needed for follow up.  No other needs at this time.    Final next level of care: Home w Home Health Services Barriers to Discharge: No Barriers Identified   Patient Goals and CMS Choice Patient states their goals for this hospitalization and ongoing recovery are:: Go home with my family      Discharge Placement                       Discharge Plan and Services                          HH Arranged: RN, PT, OT [facility] Agency: [facility] Date [facility] Agency Contacted: 06/24/20   Representative spoke with at [facility] Agency: Rosey Bath  Social Determinants of Health (SDOH) Interventions     Readmission Risk Interventions No flowsheet data found.

## 2020-06-26 NOTE — Discharge Summary (Addendum)
[facility] - [place] at [facility]   PATIENT NAME: Jeremy Padilla    MR#:  [id number]  DATE OF BIRTH:  09/01/1966  DATE OF ADMISSION:  06/21/2020 ADMITTING PHYSICIAN: Jeremy Shuttersochukwu Agbata, MD  DATE OF DISCHARGE: 06/26/2020  PRIMARY CARE PHYSICIAN: Jeremy Padilla, Emily, FNP    ADMISSION DIAGNOSIS:  Post-operative pain [G89.18] Visit for wound check [Z51.89] BKA stump complication (HCC) [T87.9] Postoperative infection, unspecified type, initial encounter [T81.40XA]  DISCHARGE DIAGNOSIS:  Left Amputation stump gangrene/necrosis--s/p Left AKA  SECONDARY DIAGNOSIS:   Past Medical History:  Diagnosis Date  . Abscess 10/05/15  . Diabetes mellitus without complication (HCC)   . 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 Hyperlipidemia   . Hypertension     HOSPITAL COURSE:  Jeremy Banimothy Wayne Parkeris a 54 y.o.malewith medical history significant fordiabetes mellitus, peripheral vascular disease status post recent [facility], history of hypertension who presents to the emergency room for evaluation of pain in his left BKA stump.  BKA stump gangrene/necrosis -Patient is status post recent 05/31/2020) left BKA for severe peripheral arterial disease and has complications following surgery with necrosis/gangrene -7/14>>Vascular surgery has been consulted--POD # 3 s/p left AKA  by  Dr Wyn Quakerew -blood cultures negative so far -prn po pain meds -7/16>> tolerating PT well. Dressing change in OR 7/17 -7/17>> dressing change to be done today by Vascular surgery  Anemia--Iron def Start po iron pills Check iron studies and B12  Hypertension -Blood pressure stable -Continue amlodipine and lisinopril  Diabetes mellitus type 2, uncontrolled, mild hyperglycemia with severe PAD -Maintain consistent carbohydrate diet -Glycemic control with sliding scale insulin and lantus 12 units qd  Hyponatremia -Most likely secondary to hydrochlorothiazideuseand volume depletion from  dehydration--resolved -Hold hydrochlorothiazide for now  Severe PAD --s/[facility] --was  [facility] left [facility]KA 06/05/2020  --cont asa--no indication for eliquis per Vascular sx   Dehydration/AKI -resolved -Patient has a baseline serum creatinine of 1.12 but today on admission it is 1.59--down to 1.2--1.01   DVT prophylaxis:Lovenox Code Status:Full code Family Communication:Greater than 50% of time was spent discussing plan of care with patient at the bedside. All questions and concerns have been addressed. He verbalizes understanding and agrees with the plan. Disposition Plan:TBD--most likely home given no insurance and very limited options  TOC--for d/c planning to home with HHPT on 7/17 Consults called:Vascular surgery  Status is: Inpatient  Remains inpatient appropriate because pt needs PT eval POD #2, TOC to work with family for d/c planning -need dressing change in OR 7/17  Dispo: The patient is from: Home  Anticipated d/c is to: Home  Anticipated d/c date is: 7/17 with [facility]   Patient currently is stable  As above  CONSULTS OBTAINED:    DRUG ALLERGIES:   Allergies  Allergen Reactions  . Penicillins Rash    .Has patient had a PCN reaction causing immediate rash, facial/tongue/throat swelling, SOB or lightheadedness with hypotension: Unknown Has patient had a PCN reaction causing severe rash involving mucus membranes or skin necrosis: Unknown Has patient had a PCN reaction that required hospitalization: Unknown Has patient had a PCN reaction occurring within the last 10 years: Unknown If all of the above answers are "NO", then may proceed with Cephalosporin use.  Patient Tolerates Cephalosporins     DISCHARGE MEDICATIONS:   Allergies as of 06/26/2020      Reactions   Penicillins Rash   .Has patient had a PCN reaction causing  immediate rash,  facial/tongue/throat swelling, SOB or lightheadedness with hypotension: Unknown Has patient had a PCN reaction causing severe rash involving mucus membranes or skin necrosis: Unknown Has patient had a PCN reaction that required hospitalization: Unknown Has patient had a PCN reaction occurring within the last 10 years: Unknown If all of the above answers are "NO", then may proceed with Cephalosporin use. Patient Tolerates Cephalosporins      Medication List    STOP taking these medications   hydrochlorothiazide 25 MG tablet Commonly known as: HYDRODIURIL     TAKE these medications   amLODipine 10 MG tablet Commonly known as: NORVASC Take 1 tablet (10 mg total) by mouth daily.   ascorbic acid 250 MG tablet Commonly known as: VITAMIN C Take 1 tablet (250 mg total) by mouth 2 (two) times daily.   aspirin EC 81 MG tablet Take 81 mg by mouth daily.   atorvastatin 40 MG tablet Commonly known as: LIPITOR Take 1 tablet (40 mg total) by mouth daily.   ferrous sulfate 325 (65 FE) MG tablet Take 1 tablet (325 mg total) by mouth 2 (two) times daily with a meal.   glipiZIDE 10 MG tablet Commonly known as: GLUCOTROL Take 10 mg by mouth daily before breakfast.   insulin glargine 100 unit/mL Sopn Commonly known as: LANTUS Inject 8 Units into the skin at bedtime.   lisinopril 40 MG tablet Commonly known as: ZESTRIL Take 40 mg by mouth daily.   multivitamin-lutein Caps capsule Take 1 capsule by mouth daily.   oxyCODONE 5 MG immediate release tablet Commonly known as: Oxy IR/ROXICODONE Take 1 tablet (5 mg total) by mouth every 6 (six) hours as needed for moderate pain or severe pain.            Discharge Care Instructions  (From admission, onward)         Start     Ordered   06/26/20 0000  Discharge wound care:       Comments: Per vascular surgery instructions as discussed with family   06/26/20 0911          If you experience worsening of your admission symptoms,  develop shortness of breath, life threatening emergency, suicidal or homicidal thoughts you must seek medical attention immediately by calling 911 or calling your MD immediately  if symptoms less severe.  You Must read complete instructions/literature along with all the possible adverse reactions/side effects for all the Medicines you take and that have been prescribed to you. Take any new Medicines after you have completely understood and accept all the possible adverse reactions/side effects.   Please note  You were cared for by a hospitalist during your hospital stay. If you have any questions about your discharge medications or the care you received while you were in the hospital after you are discharged, you can call the unit and asked to speak with the hospitalist on call if the hospitalist that took care of you is not available. Once you are discharged, your primary care physician will handle any further medical issues. Please note that NO REFILLS for any discharge medications will be authorized once you are discharged, as it is imperative that you return to your primary care physician (or establish a relationship with a primary care physician if you do not have one) for your aftercare needs so that they can reassess your need for medications and monitor your lab values. Today   SUBJECTIVE   Doing well. Worked with PT. Ambulating using walker Sisters  in the room  VITAL SIGNS:  Blood pressure (!) 159/74, pulse 99, temperature 98.2 F (36.8 C), temperature source Oral, resp. rate 16, height 6\' 2"  (1.88 m), weight 70.9 kg, SpO2 100 %.  I/O:    Intake/Output Summary (Last 24 hours) at 06/26/2020 0913 Last data filed at 06/26/2020 0120 Gross per 24 hour  Intake 240 ml  Output 625 ml  Net -385 ml    PHYSICAL EXAMINATION:  GENERAL:  54 y.o.-year-old patient lying in the bed with no acute distress.  EYES: Pupils equal, round, reactive to light and accommodation. No scleral icterus.   HEENT: Head atraumatic, normocephalic. Oropharynx and nasopharynx clear.  NECK:  Supple, no jugular venous distention. No thyroid enlargement, no tenderness.  LUNGS: Normal breath sounds bilaterally, no wheezing, rales,rhonchi or crepitation. No use of accessory muscles of respiration.  CARDIOVASCULAR: S1, S2 normal. No murmurs, rubs, or gallops.  ABDOMEN: Soft, non-tender, non-distended. Bowel sounds present. No organomegaly or mass.  EXTREMITIES: No pedal edema, cyanosis, or clubbing. Left AKA stump+ NEUROLOGIC: Cranial nerves II through XII are intact. Muscle strength 5/5 in all extremities. Sensation intact. Gait not checked.  PSYCHIATRIC:  patient is alert and oriented x 3.  SKIN:  Pressure Injury 06/21/20 Sacrum Mid Stage 2 -  Partial thickness loss of dermis presenting as a shallow open injury with a red, pink wound bed without slough. pink (Active)  06/21/20 1900  Location: Sacrum  Location Orientation: Mid  Staging: Stage 2 -  Partial thickness loss of dermis presenting as a shallow open injury with a red, pink wound bed without slough.  Wound Description (Comments): pink  Present on Admission: Yes   DATA REVIEW:   CBC  Recent Labs  Lab 06/25/20 0416  WBC 7.8  HGB 7.3*  HCT 21.6*  PLT 339    Chemistries  Recent Labs  Lab 06/23/20 0417 06/24/20 0455 06/25/20 0416  NA 135  --  137  K 4.1  --  3.6  CL 100  --  105  CO2 26  --  23  GLUCOSE 182*  --  138*  BUN 28*  --  26*  CREATININE 1.01   < > 0.83  CALCIUM 9.1  --  8.4*  MG 1.9  --   --    < > = values in this interval not displayed.    Microbiology Results   Recent Results (from the past 240 hour(s))  CULTURE, BLOOD (ROUTINE X 2) w Reflex to ID Panel     Status: None   Collection Time: 06/21/20  4:55 PM   Specimen: BLOOD  Result Value Ref Range Status   Specimen Description BLOOD RIGHT ANTECUBITAL  Final   Special Requests   Final    BOTTLES DRAWN AEROBIC AND ANAEROBIC Blood Culture adequate volume    Culture   Final    NO GROWTH 5 DAYS Performed at [facility], [address] [place], [place]    Report Status 06/26/2020 FINAL  Final  CULTURE, BLOOD (ROUTINE X 2) w Reflex to ID Panel     Status: None   Collection Time: 06/21/20  4:55 PM   Specimen: BLOOD  Result Value Ref Range Status   Specimen Description BLOOD BLOOD RIGHT FOREARM  Final   Special Requests   Final    BOTTLES DRAWN AEROBIC AND ANAEROBIC Blood Culture adequate volume   Culture   Final    NO GROWTH 5 DAYS Performed at [facility], [address] [place],  [place] [address]    Report Status 06/26/2020 FINAL  Final  SARS Coronavirus 2 by RT PCR (hospital order, performed in [facility] hospital lab) Nasopharyngeal Nasopharyngeal Swab     Status: None   Collection Time: 06/21/20  6:18 PM   Specimen: Nasopharyngeal Swab  Result Value Ref Range Status   SARS Coronavirus 2 NEGATIVE NEGATIVE Final    Comment: (NOTE) SARS-CoV-2 target nucleic acids are NOT DETECTED.  The SARS-CoV-2 RNA is generally detectable in upper and lower respiratory specimens during the acute phase of infection. The lowest concentration of SARS-CoV-2 viral copies this assay can detect is 250 copies / mL. A negative result does not preclude SARS-CoV-2 infection and should not be used as the sole basis for treatment or other patient management decisions.  A negative result may occur with improper specimen collection / handling, submission of specimen other than nasopharyngeal swab, presence of viral mutation(s) within the areas targeted by this assay, and inadequate number of viral copies (<250 copies / mL). A negative result must be combined with clinical observations, patient history, and epidemiological information.  Fact Sheet for Patients:   BoilerBrush.com.cy  Fact Sheet for Healthcare Providers: https://pope.com/  This test is not yet approved or  cleared  by the [place] FDA and has been authorized for detection and/or diagnosis of SARS-CoV-2 by FDA under an Emergency Use Authorization (EUA).  This EUA will remain in effect (meaning this test can be used) for the duration of the COVID-19 declaration under Section 564(b)(1) of the Act, 21 U.S.C. section 360bbb-3(b)(1), unless the authorization is terminated or revoked sooner.  Performed at [facility], [address], [place], [place] [address]     RADIOLOGY:  No results found.   CODE STATUS:     Code Status Orders  (From admission, onward)         Start     Ordered   06/21/20 1656  Full code  Continuous        06/21/20 1700        Code Status History    Date Active Date Inactive Code Status Order ID Comments User Context   04/19/2020 1116 04/21/2020 2123 Full Code [id number]  Richardson Dopp Inpatient   05/30/2019 1933 05/31/2019 1704 Full Code [id number]  Shaune Pollack, MD Inpatient   05/30/2019 1806 05/30/2019 1933 Full Code [id number]  Renford Dills, MD Inpatient   04/09/2019 1141 04/09/2019 2018 Full Code [id number]  Annice Needy, MD Inpatient   Advance Care Planning Activity       TOTAL TIME TAKING CARE OF THIS PATIENT: *35* minutes.    Enedina Finner M.D  [facility]    CC: Primary care physician; Jeremy Cookey, FNP

## 2020-06-26 NOTE — Plan of Care (Signed)
Discharge order received. Patient mental status is at baseline. Vital signs stable . No signs of acute distress. Discharge instructions given. Patient verbalized understanding. No other issues noted at this time.   

## 2020-06-28 ENCOUNTER — Telehealth: Payer: Self-pay

## 2020-06-28 LAB — SURGICAL PATHOLOGY

## 2020-06-28 NOTE — Telephone Encounter (Signed)
PC to pt.  Was made aware of water issue in [place], while recently hospitalized at [facility].  Reassured pt. That the hospital water was tested, and not found to be contaminated.  Advised pt. He is at low risk for any infection related to this issue.  Advised to call PCP if develops diarrhea, stomach cramps, or nausea/ vomiting.  Pt. Verb. Understanding.

## 2020-07-12 ENCOUNTER — Other Ambulatory Visit: Payer: Self-pay

## 2020-07-12 ENCOUNTER — Ambulatory Visit (INDEPENDENT_AMBULATORY_CARE_PROVIDER_SITE_OTHER): Payer: Self-pay | Admitting: Nurse Practitioner

## 2020-07-12 ENCOUNTER — Encounter (INDEPENDENT_AMBULATORY_CARE_PROVIDER_SITE_OTHER): Payer: Self-pay | Admitting: Nurse Practitioner

## 2020-07-12 VITALS — BP 146/89 | HR 101 | Resp 18 | Ht 74.0 in | Wt 175.0 lb

## 2020-07-12 DIAGNOSIS — I739 Peripheral vascular disease, unspecified: Secondary | ICD-10-CM

## 2020-07-12 DIAGNOSIS — Z89612 Acquired absence of left leg above knee: Secondary | ICD-10-CM

## 2020-07-12 DIAGNOSIS — E118 Type 2 diabetes mellitus with unspecified complications: Secondary | ICD-10-CM

## 2020-07-12 MED ORDER — GABAPENTIN 300 MG PO CAPS
300.0000 mg | ORAL_CAPSULE | Freq: Every day | ORAL | 3 refills | Status: DC
Start: 2020-07-12 — End: 2022-08-24

## 2020-07-14 ENCOUNTER — Encounter (INDEPENDENT_AMBULATORY_CARE_PROVIDER_SITE_OTHER): Payer: Self-pay | Admitting: Nurse Practitioner

## 2020-07-14 NOTE — Progress Notes (Signed)
Subjective:    Patient ID: Jeremy Padilla, male    DOB: [birth year], 54 y.o.   MRN: [id number] Chief Complaint  Patient presents with  . Follow-up    post op amputation and staple removal    Patient presents today for follow-up after left above-knee amputation revision on 06/23/2020.  The patient previously had a below-knee amputation done at [facility] however he began to have gangrenous changes and presented to [facility].  Unfortunately the stump was unable to be salvaged and a revision was required.  Since that time the patient has been doing much better.  He has no signs symptoms of infection.  The wound is clean dry and intact.  He does have some discomfort in the stump area.  The patient notes that the previous discomfort that he had following his below-knee amputation was helped with gabapentin.   Review of Systems  Skin: Positive for wound.  Neurological: Positive for weakness.  All other systems reviewed and are negative.      Objective:   Physical Exam Vitals reviewed.  Cardiovascular:     Rate and Rhythm: Normal rate and regular rhythm.     Pulses: Normal pulses.  Pulmonary:     Effort: Pulmonary effort is normal.  Musculoskeletal:     Left Lower Extremity: Left leg is amputated above knee.  Neurological:     Mental Status: He is alert.     Motor: Weakness present.     Gait: Gait abnormal.  Psychiatric:        Mood and Affect: Mood normal.        Behavior: Behavior normal.        Thought Content: Thought content normal.        Judgment: Judgment normal.     BP (!) 146/89 (BP Location: Right Arm)   Pulse 101   Resp 18   Ht 6\' 2"  (1.88 m)   Wt 175 lb (79.4 kg)   BMI 22.47 kg/m   Past Medical History:  Diagnosis Date  . Abscess 10/05/15  . Diabetes mellitus without complication (HCC)   . 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 Hyperlipidemia   . Hypertension     Social History   Socioeconomic History  . Marital status: Widowed     Spouse name: Not on file  . Number of children: Not on file  . Years of education: Not on file  . Highest education level: Not on file  Occupational History  . Occupation: applied for disability  Tobacco Use  . Smoking status: Never Smoker  . Smokeless tobacco: Never Used  Vaping Use  . Vaping Use: Never used  Substance and Sexual Activity  . Alcohol use: No  . Drug use: No  . Sexual activity: Not on file  Other Topics Concern  . Not on file  Social History Narrative   Wife just passed away 2009-04-17   Social Determinants of Health   Financial Resource Strain:   . Difficulty of Paying Living Expenses:   Food Insecurity:   . Worried About 04/07/2009 in the Last Year:   . Programme researcher, broadcasting/film/video in the Last Year:   Transportation Needs:   . Barista (Medical):   Freight forwarder Lack of Transportation (Non-Medical):   Physical Activity:   . Days of Exercise per Week:   . Minutes of Exercise per Session:   Stress:   . Feeling of Stress :   Social Connections:   . Frequency  of Communication with Friends and Family:   . Frequency of Social Gatherings with Friends and Family:   . Attends Religious Services:   . Active Member of Clubs or Organizations:   . Attends Banker Meetings:   Marland Kitchen Marital Status:   Intimate Partner Violence:   . Fear of Current or Ex-Partner:   . Emotionally Abused:   Marland Kitchen Physically Abused:   . Sexually Abused:     Past Surgical History:  Procedure Laterality Date  . AMPUTATION Left 06/23/2020   Procedure: AMPUTATION ABOVE KNEE;  Surgeon: Annice Needy, MD;  Location: [facility] ORS;  Service: General;  Laterality: Left;  . LOWER EXTREMITY ANGIOGRAPHY Left 04/09/2019   Procedure: LOWER EXTREMITY ANGIOGRAPHY;  Surgeon: Annice Needy, MD;  Location: [facility] INVASIVE CV LAB;  Service: Cardiovascular;  Laterality: Left;  . LOWER EXTREMITY ANGIOGRAPHY Left 04/19/2020   Procedure: Lower Extremity Angiography;  Surgeon: Annice Needy, MD;  Location: [facility]  INVASIVE CV LAB;  Service: Cardiovascular;  Laterality: Left;  . LOWER EXTREMITY ANGIOGRAPHY Left 04/20/2020   Procedure: Lower Extremity Angiography;  Surgeon: Annice Needy, MD;  Location: [facility] INVASIVE CV LAB;  Service: Cardiovascular;  Laterality: Left;  . LOWER EXTREMITY INTERVENTION N/A 05/30/2019   Procedure: LOWER EXTREMITY INTERVENTION;  Surgeon: Renford Dills, MD;  Location: [facility] INVASIVE CV LAB;  Service: Cardiovascular;  Laterality: N/A;  . WRIST SURGERY Left     Family History  Problem Relation Age of Onset  . Heart disease Father   . Hyperlipidemia Father   . Hypertension Father     Allergies  Allergen Reactions  . Penicillins Rash    .Has patient had a PCN reaction causing immediate rash, facial/tongue/throat swelling, SOB or lightheadedness with hypotension: Unknown Has patient had a PCN reaction causing severe rash involving mucus membranes or skin necrosis: Unknown Has patient had a PCN reaction that required hospitalization: Unknown Has patient had a PCN reaction occurring within the last 10 years: Unknown If all of the above answers are "NO", then may proceed with Cephalosporin use.  Patient Tolerates Cephalosporins        Assessment & Plan:   1. Left above-knee amputee [facility]) Patient is doing well post amputation.  We will refill the patient's gabapentin as he did not have any more.  The patient will also return in 1 to 2 weeks for further staple removal.  Patient did tolerate staple removal okay today.  Patient advised to contact office if his wound begins to dehisce or show signs symptoms of infection.  2. PAD (peripheral artery disease) (HCC) Once his wound is fully healed we will follow up with noninvasive studies of his right lower extremity.  However patient should have sufficient perfusion for healing his above-knee amputation.  3. Type 2 diabetes mellitus with complication, without long-term current use of insulin (HCC) Continue hypoglycemic  medications as already ordered, these medications have been reviewed and there are no changes at this time.  Hgb A1C to be monitored as already arranged by primary service    Current Outpatient Medications on File Prior to Visit  Medication Sig Dispense Refill  . amLODipine (NORVASC) 10 MG tablet Take 1 tablet (10 mg total) by mouth daily. 90 tablet 4  . ascorbic acid (VITAMIN C) 250 MG tablet Take 1 tablet (250 mg total) by mouth 2 (two) times daily. 60 tablet 0  . aspirin EC 81 MG tablet Take 81 mg by mouth daily.    Marland Kitchen atorvastatin (LIPITOR) 40 MG tablet  Take 1 tablet (40 mg total) by mouth daily. 90 tablet 3  . ferrous sulfate 325 (65 FE) MG tablet Take 1 tablet (325 mg total) by mouth 2 (two) times daily with a meal. 60 tablet 3  . glipiZIDE (GLUCOTROL) 10 MG tablet Take 10 mg by mouth daily before breakfast.    . insulin glargine (LANTUS) 100 unit/mL SOPN Inject 8 Units into the skin at bedtime.    Marland Kitchen lisinopril (ZESTRIL) 40 MG tablet Take 40 mg by mouth daily.    . multivitamin-lutein (OCUVITE-LUTEIN) CAPS capsule Take 1 capsule by mouth daily. 30 capsule 0  . oxyCODONE (OXY IR/ROXICODONE) 5 MG immediate release tablet Take 1 tablet (5 mg total) by mouth every 6 (six) hours as needed for moderate pain or severe pain. (Patient not taking: Reported on 07/12/2020) 15 tablet 0   No current facility-administered medications on file prior to visit.    There are no Patient Instructions on file for this visit. No follow-ups on file.   Georgiana Spinner, NP

## 2020-07-16 ENCOUNTER — Ambulatory Visit (INDEPENDENT_AMBULATORY_CARE_PROVIDER_SITE_OTHER): Payer: Self-pay | Admitting: Nurse Practitioner

## 2020-07-29 ENCOUNTER — Ambulatory Visit (INDEPENDENT_AMBULATORY_CARE_PROVIDER_SITE_OTHER): Payer: Medicaid Other | Admitting: Nurse Practitioner

## 2020-07-29 ENCOUNTER — Encounter (INDEPENDENT_AMBULATORY_CARE_PROVIDER_SITE_OTHER): Payer: Self-pay | Admitting: Nurse Practitioner

## 2020-07-29 ENCOUNTER — Other Ambulatory Visit: Payer: Self-pay

## 2020-07-29 VITALS — BP 143/76 | HR 96 | Resp 18 | Ht 74.0 in | Wt 149.0 lb

## 2020-07-29 DIAGNOSIS — I10 Essential (primary) hypertension: Secondary | ICD-10-CM

## 2020-07-29 DIAGNOSIS — T879 Unspecified complications of amputation stump: Secondary | ICD-10-CM | POA: Diagnosis not present

## 2020-07-29 DIAGNOSIS — E118 Type 2 diabetes mellitus with unspecified complications: Secondary | ICD-10-CM | POA: Diagnosis not present

## 2020-07-30 ENCOUNTER — Encounter (INDEPENDENT_AMBULATORY_CARE_PROVIDER_SITE_OTHER): Payer: Self-pay | Admitting: Nurse Practitioner

## 2020-07-30 NOTE — Progress Notes (Signed)
Subjective:    Patient ID: Jeremy Padilla, male    DOB: [birth year], 54 y.o.   MRN: [id number] Chief Complaint  Patient presents with  . 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  The patient's wound is clean dry and intact with no areas of dehiscence or signs symptoms of infection.  The patient states that he has been working with physical therapy as well as being very active.  The patient's stump pain is also tolerable as well.  Overall he has been doing very well since his most recent follow-up.   Review of Systems  Skin: Positive for wound.  Neurological: Positive for numbness.       Objective:   Physical Exam Vitals reviewed.  HENT:     Head: Normocephalic.  Cardiovascular:     Rate and Rhythm: Normal rate and regular rhythm.     Pulses: Normal pulses.  Pulmonary:     Effort: Pulmonary effort is normal.  Musculoskeletal:     Left Lower Extremity: Left leg is amputated above knee.  Skin:    General: Skin is warm and dry.  Neurological:     Mental Status: He is alert and oriented to person, place, and time.  Psychiatric:        Mood and Affect: Mood normal.        Behavior: Behavior normal.        Thought Content: Thought content normal.        Judgment: Judgment normal.     BP (!) 143/76 (BP Location: Right Arm)   Pulse 96   Resp 18   Ht 6\' 2"  (1.88 m)   Wt 149 lb (67.6 kg)   BMI 19.13 kg/m   Past Medical History:  Diagnosis Date  . Abscess 10/05/15  . Diabetes mellitus without complication (HCC)   . 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 Hyperlipidemia   . Hypertension     Social History   Socioeconomic History  . Marital status: Widowed    Spouse name: Not on file  . Number of children: Not on file  . Years of education: Not on file  . Highest education level: Not on file  Occupational History  . Occupation: applied for disability  Tobacco Use  . Smoking status:  Never Smoker  . Smokeless tobacco: Never Used  Vaping Use  . Vaping Use: Never used  Substance and Sexual Activity  . Alcohol use: No  . Drug use: No  . Sexual activity: Not on file  Other Topics Concern  . Not on file  Social History Narrative   Wife just passed away 2009-05-03   Social Determinants of Health   Financial Resource Strain:   . Difficulty of Paying Living Expenses: Not on file  Food Insecurity:   . Worried About 04/07/2009 in the Last Year: Not on file  . Ran Out of Food in the Last Year: Not on file  Transportation Needs:   . Lack of Transportation (Medical): Not on file  . Lack of Transportation (Non-Medical): Not on file  Physical Activity:   . Days of Exercise per Week: Not on file  . Minutes of Exercise per Session: Not on file  Stress:   . Feeling of Stress : Not on file  Social Connections:   . Frequency of Communication with Friends and Family: Not on file  . Frequency of Social Gatherings  with Friends and Family: Not on file  . Attends Religious Services: Not on file  . Active Member of Clubs or Organizations: Not on file  . Attends Banker Meetings: Not on file  . Marital Status: Not on file  Intimate Partner Violence:   . Fear of Current or Ex-Partner: Not on file  . Emotionally Abused: Not on file  . Physically Abused: Not on file  . Sexually Abused: Not on file    Past Surgical History:  Procedure Laterality Date  . AMPUTATION Left 06/23/2020   Procedure: AMPUTATION ABOVE KNEE;  Surgeon: Annice Needy, MD;  Location: [facility];  Service: General;  Laterality: Left;  . LOWER EXTREMITY ANGIOGRAPHY Left 04/09/2019   Procedure: LOWER EXTREMITY ANGIOGRAPHY;  Surgeon: Annice Needy, MD;  Location: [facility];  Service: Cardiovascular;  Laterality: Left;  . LOWER EXTREMITY ANGIOGRAPHY Left 04/19/2020   Procedure: Lower Extremity Angiography;  Surgeon: Annice Needy, MD;  Location: [facility];  Service:  Cardiovascular;  Laterality: Left;  . LOWER EXTREMITY ANGIOGRAPHY Left 04/20/2020   Procedure: Lower Extremity Angiography;  Surgeon: Annice Needy, MD;  Location: [facility];  Service: Cardiovascular;  Laterality: Left;  . LOWER EXTREMITY INTERVENTION N/A 05/30/2019   Procedure: LOWER EXTREMITY INTERVENTION;  Surgeon: Renford Dills, MD;  Location: [facility];  Service: Cardiovascular;  Laterality: N/A;  . WRIST SURGERY Left     Family History  Problem Relation Age of Onset  . Heart disease Father   . Hyperlipidemia Father   . Hypertension Father     Allergies  Allergen Reactions  . Penicillins Rash    .Has patient had a PCN reaction causing immediate rash, facial/tongue/throat swelling, SOB or lightheadedness with hypotension: Unknown Has patient had a PCN reaction causing severe rash involving mucus membranes or skin necrosis: Unknown Has patient had a PCN reaction that required hospitalization: Unknown Has patient had a PCN reaction occurring within the last 10 years: Unknown If all of the above answers are "NO", then may proceed with Cephalosporin use.  Patient Tolerates Cephalosporins        Assessment & Plan:   1. Type 2 diabetes mellitus with complication, without long-term current use of insulin (HCC) Continue hypoglycemic medications as already ordered, these medications have been reviewed and there are no changes at this time.  Hgb A1C to be monitored as already arranged by primary service   2. BKA stump complication (HCC) All staples removed today and patient tolerated well.  Steri-Strips were applied.  The wound is intact with no areas of dehiscence.  We will have the patient return in 6 weeks to ensure that his amputation is fully healed at which point we will place a referral for prosthetic.  Patient is advised to contact her office if he should have any issues with his stump in the interim.  3. Hypertension goal BP (blood pressure) <  140/90 Continue antihypertensive medications as already ordered, these medications have been reviewed and there are no changes at this time.    Current Outpatient Medications on File Prior to Visit  Medication Sig Dispense Refill  . amLODipine (NORVASC) 10 MG tablet Take 1 tablet (10 mg total) by mouth daily. 90 tablet 4  . ascorbic acid (VITAMIN C) 250 MG tablet Take 1 tablet (250 mg total) by mouth 2 (two) times daily. 60 tablet 0  . aspirin EC 81 MG tablet Take 81 mg by mouth daily.    Marland Kitchen  atorvastatin (LIPITOR) 40 MG tablet Take 1 tablet (40 mg total) by mouth daily. 90 tablet 3  . ferrous sulfate 325 (65 FE) MG tablet Take 1 tablet (325 mg total) by mouth 2 (two) times daily with a meal. 60 tablet 3  . gabapentin (NEURONTIN) 300 MG capsule Take 1 capsule (300 mg total) by mouth at bedtime. 30 capsule 3  . glipiZIDE (GLUCOTROL) 10 MG tablet Take 10 mg by mouth daily before breakfast.    . insulin glargine (LANTUS) 100 unit/mL SOPN Inject 8 Units into the skin at bedtime.    Marland Kitchen lisinopril (ZESTRIL) 40 MG tablet Take 40 mg by mouth daily.    . multivitamin-lutein (OCUVITE-LUTEIN) CAPS capsule Take 1 capsule by mouth daily. 30 capsule 0  . oxyCODONE (OXY IR/ROXICODONE) 5 MG immediate release tablet Take 1 tablet (5 mg total) by mouth every 6 (six) hours as needed for moderate pain or severe pain. (Patient not taking: Reported on 07/12/2020) 15 tablet 0  . traZODone (DESYREL) 50 MG tablet Take by mouth.     No current facility-administered medications on file prior to visit.    There are no Patient Instructions on file for this visit. No follow-ups on file.   Georgiana Spinner, NP

## 2020-08-03 ENCOUNTER — Ambulatory Visit (INDEPENDENT_AMBULATORY_CARE_PROVIDER_SITE_OTHER): Payer: Self-pay | Admitting: Nurse Practitioner

## 2020-08-04 ENCOUNTER — Ambulatory Visit (INDEPENDENT_AMBULATORY_CARE_PROVIDER_SITE_OTHER): Payer: Self-pay | Admitting: Nurse Practitioner

## 2020-08-04 ENCOUNTER — Other Ambulatory Visit: Payer: Self-pay

## 2020-09-10 ENCOUNTER — Ambulatory Visit (INDEPENDENT_AMBULATORY_CARE_PROVIDER_SITE_OTHER): Payer: Medicaid Other | Admitting: Vascular Surgery

## 2020-09-10 ENCOUNTER — Other Ambulatory Visit: Payer: Self-pay

## 2020-09-10 VITALS — BP 171/78 | HR 78 | Ht 74.0 in | Wt 149.0 lb

## 2020-09-10 DIAGNOSIS — Z89612 Acquired absence of left leg above knee: Secondary | ICD-10-CM

## 2020-09-10 DIAGNOSIS — E118 Type 2 diabetes mellitus with unspecified complications: Secondary | ICD-10-CM

## 2020-09-10 DIAGNOSIS — I10 Essential (primary) hypertension: Secondary | ICD-10-CM

## 2020-09-10 NOTE — Assessment & Plan Note (Signed)
His AKA is well-healed.  Referral to biotech prosthesis at this point.  He should clearly have disability.  We will plan to see him back in 6 months.

## 2020-09-10 NOTE — Assessment & Plan Note (Signed)
blood glucose control important in reducing the progression of atherosclerotic disease. Also, involved in wound healing. On appropriate medications.  

## 2020-09-10 NOTE — Assessment & Plan Note (Signed)
blood pressure control important in reducing the progression of atherosclerotic disease. On appropriate oral medications.  

## 2020-09-10 NOTE — Progress Notes (Signed)
MRN : [id number]  Jeremy Padilla is a 54 y.o. ([birth year]) male who presents with chief complaint of  Chief Complaint  Patient presents with  . Follow-up    6 week stump check   .  History of Present Illness: Patient returns today in follow up of his left above-knee amputation.  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 He had a BKA at [facility] but developed a contracture and needed an AKA.  He is doing great.  Really no complaints today.  Working well with therapy.  He is scheduled to be seen by a provider in [place] for disability evaluation and get ABIs next week.  Current Outpatient Medications  Medication Sig Dispense Refill  . amLODipine (NORVASC) 10 MG tablet Take 1 tablet (10 mg total) by mouth daily. 90 tablet 4  . ascorbic acid (VITAMIN C) 250 MG tablet Take 1 tablet (250 mg total) by mouth 2 (two) times daily. 60 tablet 0  . aspirin EC 81 MG tablet Take 81 mg by mouth daily.    Marland Kitchen atorvastatin (LIPITOR) 40 MG tablet Take 1 tablet (40 mg total) by mouth daily. 90 tablet 3  . atorvastatin (LIPITOR) 80 MG tablet Take by mouth.    . ferrous sulfate 325 (65 FE) MG tablet Take 1 tablet (325 mg total) by mouth 2 (two) times daily with a meal. 60 tablet 3  . gabapentin (NEURONTIN) 300 MG capsule Take 1 capsule (300 mg total) by mouth at bedtime. 30 capsule 3  . glipiZIDE (GLUCOTROL) 10 MG tablet Take 10 mg by mouth daily before breakfast.    . insulin glargine (LANTUS) 100 unit/mL SOPN Inject 8 Units into the skin at bedtime.    Marland Kitchen lisinopril (ZESTRIL) 40 MG tablet Take 40 mg by mouth daily.    . multivitamin-lutein (OCUVITE-LUTEIN) CAPS capsule Take 1 capsule by mouth daily. 30 capsule 0  . oxyCODONE (OXY IR/ROXICODONE) 5 MG immediate release tablet Take 1 tablet (5 mg total) by mouth every 6 (six) hours as needed for moderate pain or severe pain. 15 tablet 0  . traZODone (DESYREL) 50 MG tablet Take by mouth.     No current facility-administered medications for this visit.    Past  Medical History:  Diagnosis Date  . Abscess 10/05/15  . Diabetes mellitus without complication (HCC)   . 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 Hyperlipidemia   . Hypertension     Past Surgical History:  Procedure Laterality Date  . AMPUTATION Left 06/23/2020   Procedure: AMPUTATION ABOVE KNEE;  Surgeon: Annice Needy, MD;  Location: [facility] ORS;  Service: General;  Laterality: Left;  . LOWER EXTREMITY ANGIOGRAPHY Left 04/09/2019   Procedure: LOWER EXTREMITY ANGIOGRAPHY;  Surgeon: Annice Needy, MD;  Location: [facility] INVASIVE CV LAB;  Service: Cardiovascular;  Laterality: Left;  . LOWER EXTREMITY ANGIOGRAPHY Left 04/19/2020   Procedure: Lower Extremity Angiography;  Surgeon: Annice Needy, MD;  Location: [facility] CV LAB;  Service: Cardiovascular;  Laterality: Left;  . LOWER EXTREMITY ANGIOGRAPHY Left 04/20/2020   Procedure: Lower Extremity Angiography;  Surgeon: Annice Needy, MD;  Location: [facility] CV LAB;  Service: Cardiovascular;  Laterality: Left;  . LOWER EXTREMITY INTERVENTION N/A 05/30/2019   Procedure: LOWER EXTREMITY INTERVENTION;  Surgeon: Renford Dills, MD;  Location: [facility] INV[facility]IVE CV LAB;  Service: Cardiovascular;  Laterality: N/A;  . WRIST SURGERY Left      Social History   Tobacco Use  . Smoking status: Never Smoker  .  Smokeless tobacco: Never Used  Vaping Use  . Vaping Use: Never used  Substance Use Topics  . Alcohol use: No  . Drug use: No      Family History  Problem Relation Age of Onset  . Heart disease Father   . Hyperlipidemia Father   . Hypertension Father     Allergies  Allergen Reactions  . Penicillins Rash    .Has patient had a PCN reaction causing immediate rash, facial/tongue/throat swelling, SOB or lightheadedness with hypotension: Unknown Has patient had a PCN reaction causing severe rash involving mucus membranes or skin necrosis: Unknown Has patient had a PCN reaction that required hospitalization: Unknown Has patient had a PCN reaction occurring  within the last 10 years: Unknown If all of the above answers are "NO", then may proceed with Cephalosporin use.  Patient Tolerates Cephalosporins      REVIEW OF SYSTEMS (Negative unless checked)  Constitutional: [] Weight loss  [] Fever  [] Chills Cardiac: [] Chest pain   [] Chest pressure   [] Palpitations   [] Shortness of breath when laying flat   [] Shortness of breath at rest   [] Shortness of breath with exertion. Vascular:  [] Pain in legs with walking   [] Pain in legs at rest   [] Pain in legs when laying flat   [] Claudication   [] Pain in feet when walking  [] Pain in feet at rest  [] Pain in feet when laying flat   [] History of DVT   [] Phlebitis   [] Swelling in legs   [] Varicose veins   [] Non-healing ulcers Pulmonary:   [] Uses home oxygen   [] Productive cough   [] Hemoptysis   [] Wheeze  [] COPD   [] Asthma Neurologic:  [] Dizziness  [] Blackouts   [] Seizures   [] History of stroke   [] History of TIA  [] Aphasia   [] Temporary blindness   [] Dysphagia   [] Weakness or numbness in arms   [] Weakness or numbness in legs Musculoskeletal:  [] Arthritis   [] Joint swelling   [] Joint pain   [] Low back pain Hematologic:  [] Easy bruising  [] Easy bleeding   [] Hypercoagulable state   [] Anemic   Gastrointestinal:  [] Blood in stool   [] Vomiting blood  [] Gastroesophageal reflux/heartburn   [] Abdominal pain Genitourinary:  [] Chronic kidney disease   [] Difficult urination  [] Frequent urination  [] Burning with urination   [] Hematuria Skin:  [] Rashes   [] Ulcers   [] Wounds Psychological:  [] History of anxiety   []  History of major depression.  Physical Examination  BP (!) 171/78   Pulse 78   Ht 6\' 2"  (1.88 m)   Wt 149 lb (67.6 kg)   BMI 19.13 kg/m  Gen:  WD/WN, NAD Head: [place]/AT, No temporalis wasting. Ear/Nose/Throat: Hearing grossly intact, nares w/o erythema or drainage Eyes: Conjunctiva clear. Sclera non-icteric Neck: Supple.  Trachea midline Pulmonary:  Good air movement, no use of accessory muscles.  Cardiac:  RRR, no JVD Vascular:  Vessel Right Left  Radial Palpable Palpable                          PT 1+ Palpable Not Palpable  DP Palpable Not Palpable   Gastrointestinal: soft, non-tender/non-distended. No guarding/reflex.  Musculoskeletal: M/S 5/5 throughout.  Left AKA well healed. No edema. Neurologic: Sensation grossly intact in extremities.  Symmetrical.  Speech is fluent.  Psychiatric: Judgment intact, Mood & affect appropriate for pt's clinical situation. Dermatologic: No rashes or ulcers noted.  No cellulitis or open wounds.       Labs Recent Results (from the past 2160 hour(s))  CBC     Status: Abnormal   Collection Time: 06/21/20  2:22 PM  Result Value Ref Range   WBC 11.7 (H) 4.0 - 10.5 K/uL   RBC 2.89 (L) 4.22 - 5.81 MIL/uL   Hemoglobin 8.0 (L) 13.0 - 17.0 g/dL   HCT 85.2 (L) 39 - 52 %   MCV 78.9 (L) 80.0 - 100.0 fL   MCH 27.7 26.0 - 34.0 pg   MCHC 35.1 30.0 - 36.0 g/dL   RDW 77.8 24.2 - 35.3 %   Platelets 409 (H) 150 - 400 K/uL   nRBC 0.0 0.0 - 0.2 %    Comment: Performed at [facility], [address] [place], [place] [address]  Basic metabolic panel     Status: Abnormal   Collection Time: 06/21/20  2:22 PM  Result Value Ref Range   Sodium 128 (L) 135 - 145 mmol/L   Potassium 4.2 3.5 - 5.1 mmol/L   Chloride 93 (L) 98 - 111 mmol/L   CO2 28 22 - 32 mmol/L   Glucose, Bld 292 (H) 70 - 99 mg/dL    Comment: Glucose reference range applies only to samples taken after fasting for at least 8 hours.   BUN 69 (H) 6 - 20 mg/dL   Creatinine, Ser 1.54 (H) 0.61 - 1.24 mg/dL   Calcium 9.2 8.9 - 00.8 mg/dL   GFR calc non Af Amer 48 (L) >60 mL/min   GFR calc Af Amer 56 (L) >60 mL/min   Anion gap 7 5 - 15    Comment: Performed at [facility], [address] [place], [place] [address]  CULTURE, BLOOD (ROUTINE X 2) w Reflex to ID Panel     Status: None   Collection Time: 06/21/20  4:55 PM   Specimen: BLOOD  Result Value Ref Range   Specimen  Description BLOOD RIGHT ANTECUBITAL    Special Requests      BOTTLES DRAWN AEROBIC AND ANAEROBIC Blood Culture adequate volume   Culture      NO GROWTH 5 DAYS Performed at [facility], [address] [place], [place] [address]    Report Status 06/26/2020 FINAL   CULTURE, BLOOD (ROUTINE X 2) w Reflex to ID Panel     Status: None   Collection Time: 06/21/20  4:55 PM   Specimen: BLOOD  Result Value Ref Range   Specimen Description BLOOD BLOOD RIGHT FOREARM    Special Requests      BOTTLES DRAWN AEROBIC AND ANAEROBIC Blood Culture adequate volume   Culture      NO GROWTH 5 DAYS Performed at [facility], [address], [place], [place] [address]    Report Status 06/26/2020 FINAL   SARS Coronavirus 2 by RT PCR (hospital order, performed in [facility] hospital lab) Nasopharyngeal Nasopharyngeal Swab     Status: None   Collection Time: 06/21/20  6:18 PM   Specimen: Nasopharyngeal Swab  Result Value Ref Range   SARS Coronavirus 2 NEGATIVE NEGATIVE    Comment: (NOTE) SARS-CoV-2 target nucleic acids are NOT DETECTED.  The SARS-CoV-2 RNA is generally detectable in upper and lower respiratory specimens during the acute phase of infection. The lowest concentration of SARS-CoV-2 viral copies this assay can detect is 250 copies / mL. A negative result does not preclude SARS-CoV-2 infection and should not be used as the sole basis for treatment or other patient management decisions.  A negative result may occur with improper specimen collection / handling, submission of  specimen other than nasopharyngeal swab, presence of viral mutation(s) within the areas targeted by this assay, and inadequate number of viral copies (<250 copies / mL). A negative result must be combined with clinical observations, patient history, and epidemiological information.  Fact Sheet for Patients:   BoilerBrush.com.cy  Fact Sheet for Healthcare  Providers: https://pope.com/  This test is not yet approved or  cleared by the [place] FDA and has been authorized for detection and/or diagnosis of SARS-CoV-2 by FDA under an Emergency Use Authorization (EUA).  This EUA will remain in effect (meaning this test can be used) for the duration of the COVID-19 declaration under Section 564(b)(1) of the Act, 21 U.S.C. section 360bbb-3(b)(1), unless the authorization is terminated or revoked sooner.  Performed at [facility], [address] [place], [place] [address]   Basic metabolic panel     Status: Abnormal   Collection Time: 06/21/20  8:17 PM  Result Value Ref Range   Sodium 132 (L) 135 - 145 mmol/L   Potassium 4.5 3.5 - 5.1 mmol/L   Chloride 94 (L) 98 - 111 mmol/L   CO2 28 22 - 32 mmol/L   Glucose, Bld 241 (H) 70 - 99 mg/dL    Comment: Glucose reference range applies only to samples taken after fasting for at least 8 hours.   BUN 64 (H) 6 - 20 mg/dL   Creatinine, Ser 8.78 0.61 - 1.24 mg/dL   Calcium 9.5 8.9 - 67.6 mg/dL   GFR calc non Af Amer >60 >60 mL/min   GFR calc Af Amer >60 >60 mL/min   Anion gap 10 5 - 15    Comment: Performed at [facility], [address] [place], [place] [address]  Hemoglobin A1c     Status: Abnormal   Collection Time: 06/21/20  8:17 PM  Result Value Ref Range   Hgb A1c MFr Bld 8.6 (H) 4.8 - 5.6 %    Comment: (NOTE) Pre diabetes:          5.7%-6.4%  Diabetes:              >6.4%  Glycemic control for   <7.0% adults with diabetes    Mean Plasma Glucose 200.12 mg/dL    Comment: Performed at [facility], [address] [place], [place] [address]  Sedimentation rate     Status: Abnormal   Collection Time: 06/21/20  8:17 PM  Result Value Ref Range   Sed Rate 126 (H) 0 - 20 mm/hr    Comment: Performed at [facility], [address] [place], [place] [address]  C-reactive protein     Status: Abnormal   Collection Time:  06/21/20  8:23 PM  Result Value Ref Range   CRP 11.1 (H) <1.0 mg/dL    Comment: Performed at [facility], [address], [place], [place] [address]  Prealbumin     Status: Abnormal   Collection Time: 06/21/20  8:23 PM  Result Value Ref Range   Prealbumin 14.3 (L) 18 - 38 mg/dL    Comment: Performed at [facility], [address], [place], [place] [address]  Glucose, capillary     Status: Abnormal   Collection Time: 06/21/20  9:06 PM  Result Value Ref Range   Glucose-Capillary 237 (H) 70 - 99 mg/dL    Comment: Glucose reference range applies only to samples taken after fasting for at least 8 hours.  Basic metabolic panel     Status: Abnormal  Collection Time: 06/22/20  4:06 AM  Result Value Ref Range   Sodium 134 (L) 135 - 145 mmol/L   Potassium 4.9 3.5 - 5.1 mmol/L   Chloride 96 (L) 98 - 111 mmol/L   CO2 26 22 - 32 mmol/L   Glucose, Bld 260 (H) 70 - 99 mg/dL    Comment: Glucose reference range applies only to samples taken after fasting for at least 8 hours.   BUN 43 (H) 6 - 20 mg/dL   Creatinine, Ser 4.09 0.61 - 1.24 mg/dL   Calcium 9.6 8.9 - 81.1 mg/dL   GFR calc non Af Amer >60 >60 mL/min   GFR calc Af Amer >60 >60 mL/min   Anion gap 12 5 - 15    Comment: Performed at [facility], [address] [place], [place] [address]  CBC     Status: Abnormal   Collection Time: 06/22/20  4:06 AM  Result Value Ref Range   WBC 12.7 (H) 4.0 - 10.5 K/uL   RBC 3.51 (L) 4.22 - 5.81 MIL/uL   Hemoglobin 9.5 (L) 13.0 - 17.0 g/dL   HCT 29.5 (L) 39 - 52 %   MCV 79.5 (L) 80.0 - 100.0 fL   MCH 27.1 26.0 - 34.0 pg   MCHC 34.1 30.0 - 36.0 g/dL   RDW 62.1 30.8 - 65.7 %   Platelets 502 (H) 150 - 400 K/uL   nRBC 0.0 0.0 - 0.2 %    Comment: Performed at [facility], [address] [place], [place] [address]  HIV Antibody (routine testing w rflx)     Status: None   Collection Time: 06/22/20  4:06 AM  Result Value Ref Range   HIV Screen 4th Generation wRfx Non  Reactive Non Reactive    Comment: Performed at [facility], [address], [place], [place] [address]  ABO/Rh     Status: None   Collection Time: 06/22/20  4:06 AM  Result Value Ref Range   ABO/RH(D)      O POS Performed at [facility], [address] [place], [place] [address]   Glucose, capillary     Status: Abnormal   Collection Time: 06/22/20  7:36 AM  Result Value Ref Range   Glucose-Capillary 242 (H) 70 - 99 mg/dL    Comment: Glucose reference range applies only to samples taken after fasting for at least 8 hours.  Type and screen     Status: None   Collection Time: 06/22/20 10:19 AM  Result Value Ref Range   ABO/RH(D) O POS    Antibody Screen NEG    Sample Expiration 06/25/2020,2359    Unit Number [id number]    Blood Component Type RED CELLS,LR    Unit division 00    Status of Unit REL FROM [facility]    Transfusion Status OK TO TRANSFUSE    Crossmatch Result      Compatible Performed at [facility], [address] [place], [place] [address]    Unit Number [id number]    Blood Component Type RED CELLS,LR    Unit division 00    Status of Unit REL FROM [facility]    Transfusion Status OK TO TRANSFUSE    Crossmatch Result Compatible   BPAM RBC     Status: None   Collection Time: 06/22/20 10:19 AM  Result Value Ref Range   Blood Product Unit Number [id number]    PRODUCT CODE [id number]    Unit Type and Rh 5100  Blood Product Expiration Date 956213086578    Blood Product Unit Number [id number]    PRODUCT CODE [id number]    Unit Type and Rh 5100    Blood Product Expiration Date 536644034742   Prepare RBC (crossmatch)     Status: None   Collection Time: 06/22/20 10:51 AM  Result Value Ref Range   Order Confirmation      ORDER PROCESSED BY BLOOD BANK Performed at [facility], [address] [place], [place] [address]   Glucose, capillary     Status: Abnormal   Collection Time: 06/22/20 11:47 AM  Result Value Ref Range    Glucose-Capillary 187 (H) 70 - 99 mg/dL    Comment: Glucose reference range applies only to samples taken after fasting for at least 8 hours.  Glucose, capillary     Status: Abnormal   Collection Time: 06/22/20  4:37 PM  Result Value Ref Range   Glucose-Capillary 159 (H) 70 - 99 mg/dL    Comment: Glucose reference range applies only to samples taken after fasting for at least 8 hours.   Comment 1 Notify RN   Glucose, capillary     Status: Abnormal   Collection Time: 06/22/20  8:45 PM  Result Value Ref Range   Glucose-Capillary 135 (H) 70 - 99 mg/dL    Comment: Glucose reference range applies only to samples taken after fasting for at least 8 hours.  CBC     Status: Abnormal   Collection Time: 06/23/20  4:17 AM  Result Value Ref Range   WBC 14.5 (H) 4.0 - 10.5 K/uL   RBC 3.34 (L) 4.22 - 5.81 MIL/uL   Hemoglobin 9.1 (L) 13.0 - 17.0 g/dL   HCT 87.5 (L) 39 - 52 %   MCV 79.6 (L) 80.0 - 100.0 fL   MCH 27.2 26.0 - 34.0 pg   MCHC 34.2 30.0 - 36.0 g/dL   RDW 64.3 32.9 - 51.8 %   Platelets 467 (H) 150 - 400 K/uL   nRBC 0.0 0.0 - 0.2 %    Comment: Performed at [facility], [address], [place], [place] [address]  Basic metabolic panel     Status: Abnormal   Collection Time: 06/23/20  4:17 AM  Result Value Ref Range   Sodium 135 135 - 145 mmol/L   Potassium 4.1 3.5 - 5.1 mmol/L   Chloride 100 98 - 111 mmol/L   CO2 26 22 - 32 mmol/L   Glucose, Bld 182 (H) 70 - 99 mg/dL    Comment: Glucose reference range applies only to samples taken after fasting for at least 8 hours.   BUN 28 (H) 6 - 20 mg/dL   Creatinine, Ser 0.63 0.61 - 1.24 mg/dL   Calcium 9.1 8.9 - 01.6 mg/dL   GFR calc non Af Amer >60 >60 mL/min   GFR calc Af Amer >60 >60 mL/min   Anion gap 9 5 - 15    Comment: Performed at [facility], [address] [place], [place] [address]  Magnesium     Status: None   Collection Time: 06/23/20  4:17 AM  Result Value Ref Range   Magnesium 1.9 1.7 - 2.4 mg/dL     Comment: Performed at [facility], [address] [place], [place] [address]  Protime-INR     Status: None   Collection Time: 06/23/20  4:17 AM  Result Value Ref Range   Prothrombin Time 15.0 11.4 - 15.2 seconds   INR 1.2 0.8 -  1.2    Comment: (NOTE) INR goal varies based on device and disease states. Performed at [facility], [address] [place], [place] [address]   APTT     Status: Abnormal   Collection Time: 06/23/20  4:17 AM  Result Value Ref Range   aPTT 37 (H) 24 - 36 seconds    Comment:        IF BASELINE aPTT IS ELEVATED, SUGGEST PATIENT RISK ASSESSMENT BE USED TO DETERMINE APPROPRIATE ANTICOAGULANT THERAPY. Performed at [facility], [address] [place], [place] [address]   Glucose, capillary     Status: Abnormal   Collection Time: 06/23/20  7:47 AM  Result Value Ref Range   Glucose-Capillary 191 (H) 70 - 99 mg/dL    Comment: Glucose reference range applies only to samples taken after fasting for at least 8 hours.  Glucose, capillary     Status: Abnormal   Collection Time: 06/23/20 10:04 AM  Result Value Ref Range   Glucose-Capillary 195 (H) 70 - 99 mg/dL    Comment: Glucose reference range applies only to samples taken after fasting for at least 8 hours.  Surgical pathology     Status: None   Collection Time: 06/23/20 11:06 AM  Result Value Ref Range   SURGICAL PATHOLOGY      SURGICAL PATHOLOGY CASE: [id number] PATIENT: Dimas Aguas Surgical Pathology Report     Specimen Submitted: A. Leg, left; BKA stump; above-knee amputation  Clinical History: Status post below the knee amputation on 05/31/20, with stump necrosis.     DIAGNOSIS: A. LEG, LEFT, BKA STUMP; ABOVE THE KNEE AMPUTATION: - SEVERE ATHEROSCLEROSIS; PROXIMAL STENT PRESENT WITH STENOSIS. - CELLULITIS WITH EXTENSIVE SOFT TISSUE NECROSIS. - NORMOCELLULAR BONE MARROW WITH TRILINEAGE HEMATOPOIESIS. - PROXIMAL SOFT TISSUE APPEARS VIABLE.  GROSS  DESCRIPTION: A. Labeled: Left above-knee amputation Received: Fresh Size: 32.4 x 14 x 10 cm Description of lesion(s): Received is an above-the-knee amputation specimen.  A prior below the knee amputation has been performed.  At the prior amputation site there is a 13.5 x 11 cm area of black-brown softened skin and underlying tissue.  Within this area there is a 16 x 6 cm T shaped linear incision which has multiple staples.  The  remaining skin is tan-white and smooth. The anterior and posterior tibial arteries display severe atherosclerosis.  Extending from the resection margin for a length of 12 cm is a metal mesh stent within the femoral artery.  The stent contains moderate to severe in-stent restenosis.  The adjacent vessel displays severe atherosclerosis. Proximal margin:  The resection margin is grossly viable with tan-white smooth skin, red firm muscle, and tan soft tissue. Bone: At the resection margin there is a 9.7 x 3.2 x 3 cm exposed portion of bone.  The bone is firm and grossly unremarkable.  The bone underlying the area of discoloration is firm and grossly unremarkable. Other findings: None grossly appreciated  Block summary: 1 - femoral resection margin reamings, following decalcification 2 - representative skin, soft tissue, and muscle from resection margin, en face 3 - representative discolored skin and underlying soft tissue 4 - bone underlying area of discoloration, followi ng decalcification 5 - representative anterior tibial artery cross section 6 - representative distal femoral artery/proximal posterior tibial artery cross section  Tissue decalcification: Yes, cassettes 1 and 4   Final Diagnosis performed by Ronald Lobo, MD.   Electronically signed 06/28/2020 12:25:08PM The electronic signature indicates that the named Attending Pathologist has evaluated the specimen  Technical component performed at [facility], [address], [place], [place] [address] Lab:  [phone] Dir: Jolene Schimke, MD, MMM  Professional component performed at [facility], [address] [place], [place], [place] [address] Lab: [phone] Dir: Georgiann Cocker. Rubinas, MD   Glucose, capillary     Status: Abnormal   Collection Time: 06/23/20 11:47 AM  Result Value Ref Range   Glucose-Capillary 188 (H) 70 - 99 mg/dL    Comment: Glucose reference range applies only to samples taken after fasting for at least 8 hours.  Glucose, capillary     Status: Abnormal   Collection Time: 06/23/20  1:14 PM  Result Value Ref Range   Glucose-Capillary 208 (H) 70 - 99 mg/dL    Comment: Glucose reference range applies only to samples taken after fasting for at least 8 hours.  Glucose, capillary     Status: Abnormal   Collection Time: 06/23/20  4:22 PM  Result Value Ref Range   Glucose-Capillary 173 (H) 70 - 99 mg/dL    Comment: Glucose reference range applies only to samples taken after fasting for at least 8 hours.   Comment 1 Notify RN   Glucose, capillary     Status: Abnormal   Collection Time: 06/23/20  8:42 PM  Result Value Ref Range   Glucose-Capillary 146 (H) 70 - 99 mg/dL    Comment: Glucose reference range applies only to samples taken after fasting for at least 8 hours.  Creatinine, serum     Status: None   Collection Time: 06/24/20  4:55 AM  Result Value Ref Range   Creatinine, Ser 0.92 0.61 - 1.24 mg/dL   GFR calc non Af Amer >60 >60 mL/min   GFR calc Af Amer >60 >60 mL/min    Comment: Performed at [facility], [address] [place], [place] [address]  CBC     Status: Abnormal   Collection Time: 06/24/20  4:55 AM  Result Value Ref Range   WBC 10.2 4.0 - 10.5 K/uL   RBC 2.73 (L) 4.22 - 5.81 MIL/uL   Hemoglobin 7.3 (L) 13.0 - 17.0 g/dL   HCT 96.2 (L) 39 - 52 %   MCV 81.3 80.0 - 100.0 fL   MCH 26.7 26.0 - 34.0 pg   MCHC 32.9 30.0 - 36.0 g/dL   RDW 95.2 84.1 - 32.4 %   Platelets 331 150 - 400 K/uL   nRBC 0.0 0.0 - 0.2 %    Comment:  Performed at [facility], [address] [place], [place] [address]  Glucose, capillary     Status: Abnormal   Collection Time: 06/24/20  7:42 AM  Result Value Ref Range   Glucose-Capillary 150 (H) 70 - 99 mg/dL    Comment: Glucose reference range applies only to samples taken after fasting for at least 8 hours.  Glucose, capillary     Status: Abnormal   Collection Time: 06/24/20 11:56 AM  Result Value Ref Range   Glucose-Capillary 177 (H) 70 - 99 mg/dL    Comment: Glucose reference range applies only to samples taken after fasting for at least 8 hours.  Glucose, capillary     Status: Abnormal   Collection Time: 06/24/20  4:26 PM  Result Value Ref Range   Glucose-Capillary 216 (H) 70 - 99 mg/dL    Comment: Glucose reference range applies only to samples taken after fasting for at least 8 hours.  CBC     Status: Abnormal   Collection Time: 06/25/20  4:16 AM  Result Value Ref Range   WBC 7.8 4.0 - 10.5 K/uL   RBC 2.71 (L) 4.22 - 5.81 MIL/uL   Hemoglobin 7.3 (L) 13.0 - 17.0 g/dL   HCT 09.8 (L) 39 - 52 %   MCV 79.7 (L) 80.0 - 100.0 fL   MCH 26.9 26.0 - 34.0 pg   MCHC 33.8 30.0 - 36.0 g/dL   RDW 11.9 14.7 - 82.9 %   Platelets 339 150 - 400 K/uL   nRBC 0.0 0.0 - 0.2 %    Comment: Performed at [facility], [address], [place], [place] [address]  Basic metabolic panel     Status: Abnormal   Collection Time: 06/25/20  4:16 AM  Result Value Ref Range   Sodium 137 135 - 145 mmol/L   Potassium 3.6 3.5 - 5.1 mmol/L   Chloride 105 98 - 111 mmol/L   CO2 23 22 - 32 mmol/L   Glucose, Bld 138 (H) 70 - 99 mg/dL    Comment: Glucose reference range applies only to samples taken after fasting for at least 8 hours.   BUN 26 (H) 6 - 20 mg/dL   Creatinine, Ser 0.86 0.61 - 1.24 mg/dL   Calcium 8.4 (L) 8.9 - 10.3 mg/dL   GFR calc non Af Amer >60 >60 mL/min   GFR calc Af Amer >60 >60 mL/min   Anion gap 9 5 - 15    Comment: Performed at [facility], [address] [place], [place] [address]  Glucose, capillary     Status: Abnormal   Collection Time: 06/25/20  7:33 AM  Result Value Ref Range   Glucose-Capillary 127 (H) 70 - 99 mg/dL    Comment: Glucose reference range applies only to samples taken after fasting for at least 8 hours.  Glucose, capillary     Status: Abnormal   Collection Time: 06/25/20 11:31 AM  Result Value Ref Range   Glucose-Capillary 135 (H) 70 - 99 mg/dL    Comment: Glucose reference range applies only to samples taken after fasting for at least 8 hours.  Glucose, capillary     Status: Abnormal   Collection Time: 06/25/20  5:14 PM  Result Value Ref Range   Glucose-Capillary 176 (H) 70 - 99 mg/dL    Comment: Glucose reference range applies only to samples taken after fasting for at least 8 hours.  Glucose, capillary     Status: Abnormal   Collection Time: 06/25/20  8:31 PM  Result Value Ref Range   Glucose-Capillary 144 (H) 70 - 99 mg/dL    Comment: Glucose reference range applies only to samples taken after fasting for at least 8 hours.  Glucose, capillary     Status: Abnormal   Collection Time: 06/26/20  8:07 AM  Result Value Ref Range   Glucose-Capillary 161 (H) 70 - 99 mg/dL    Comment: Glucose reference range applies only to samples taken after fasting for at least 8 hours.  Glucose, capillary     Status: Abnormal   Collection Time: 06/26/20 11:57 AM  Result Value Ref Range   Glucose-Capillary 217 (H) 70 - 99 mg/dL    Comment: Glucose reference range applies only to samples taken after fasting for at least 8 hours.    Radiology No results found.  Assessment/Plan  Type 2 diabetes mellitus with complication, without long-term current use of insulin (HCC) blood glucose control important in reducing the progression of atherosclerotic disease. Also, involved in wound healing. On  appropriate medications.   Hypertension goal BP (blood pressure) < 140/90 blood pressure control important in reducing the progression  of atherosclerotic disease. On appropriate oral medications.   Hx of AKA (above knee amputation), left (HCC) His AKA is well-healed.  Referral to biotech prosthesis at this point.  He should clearly have disability.  We will plan to see him back in 6 months.    Festus BarrenJason Elga Santy, MD  09/10/2020 11:56 AM    This note was created with Dragon medical transcription system.  Any errors from dictation are purely unintentional

## 2020-09-14 ENCOUNTER — Ambulatory Visit
Admission: RE | Admit: 2020-09-14 | Discharge: 2020-09-14 | Disposition: A | Discharge status: Home / Self Care | Payer: Disability Insurance | Source: Ambulatory Visit | Attending: Pediatrics | Admitting: Pediatrics

## 2020-09-14 ENCOUNTER — Other Ambulatory Visit: Payer: Self-pay

## 2020-09-14 ENCOUNTER — Other Ambulatory Visit: Payer: Self-pay | Admitting: Pediatrics

## 2020-09-14 DIAGNOSIS — E1369 Other specified diabetes mellitus with other specified complication: Secondary | ICD-10-CM

## 2020-11-23 ENCOUNTER — Telehealth (INDEPENDENT_AMBULATORY_CARE_PROVIDER_SITE_OTHER): Payer: Self-pay | Admitting: Vascular Surgery

## 2020-11-23 NOTE — Telephone Encounter (Signed)
sent 

## 2020-11-23 NOTE — Telephone Encounter (Signed)
Called stating that [employer] needs an AKA prescription sent over to them before they can see him. They are making a visit to his house today.  Ph: [phone] Fax: [phone]

## 2020-11-30 ENCOUNTER — Encounter (INDEPENDENT_AMBULATORY_CARE_PROVIDER_SITE_OTHER): Payer: Self-pay | Admitting: Vascular Surgery

## 2020-11-30 ENCOUNTER — Other Ambulatory Visit: Payer: Self-pay

## 2020-11-30 ENCOUNTER — Ambulatory Visit (INDEPENDENT_AMBULATORY_CARE_PROVIDER_SITE_OTHER): Payer: Medicaid Other | Admitting: Vascular Surgery

## 2020-11-30 VITALS — BP 136/60 | HR 86 | Resp 16 | Wt 149.0 lb

## 2020-11-30 DIAGNOSIS — I739 Peripheral vascular disease, unspecified: Secondary | ICD-10-CM | POA: Diagnosis not present

## 2020-11-30 DIAGNOSIS — Z89612 Acquired absence of left leg above knee: Secondary | ICD-10-CM | POA: Diagnosis not present

## 2020-11-30 DIAGNOSIS — I10 Essential (primary) hypertension: Secondary | ICD-10-CM | POA: Diagnosis not present

## 2020-11-30 DIAGNOSIS — E118 Type 2 diabetes mellitus with unspecified complications: Secondary | ICD-10-CM

## 2020-11-30 NOTE — Assessment & Plan Note (Signed)
This is well-healed and he should proceed with his prosthesis and then physical therapy training for prosthetic training once he receives this. 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 We answered questions about that today.

## 2020-11-30 NOTE — Assessment & Plan Note (Signed)
To be checked in the spring

## 2020-11-30 NOTE — Progress Notes (Signed)
MRN : [id number]  Jeremy Padilla is a 54 y.o. (03-24-1966) male who presents with chief complaint of  Chief Complaint  Patient presents with  . Follow-up  .  History of Present Illness: Patient returns today in follow up of his left above-knee amputation. He is doing well. This is well-healed. He is awaiting his prosthesis. He and his family said he actually needs a new prescription to help obtain this and we will provide this today. Overall, he is doing quite well. He has an appointment scheduled for the spring to check the blood flow in his right leg.  Current Outpatient Medications  Medication Sig Dispense Refill  . amLODipine (NORVASC) 10 MG tablet Take 1 tablet (10 mg total) by mouth daily. 90 tablet 4  . ascorbic acid (VITAMIN C) 250 MG tablet Take 1 tablet (250 mg total) by mouth 2 (two) times daily. 60 tablet 0  . aspirin EC 81 MG tablet Take 81 mg by mouth daily.    Marland Kitchen atorvastatin (LIPITOR) 40 MG tablet Take 1 tablet (40 mg total) by mouth daily. 90 tablet 3  . atorvastatin (LIPITOR) 80 MG tablet Take by mouth.    . ferrous sulfate 325 (65 FE) MG tablet Take 1 tablet (325 mg total) by mouth 2 (two) times daily with a meal. 60 tablet 3  . glipiZIDE (GLUCOTROL) 10 MG tablet Take 10 mg by mouth daily before breakfast.    . lisinopril (ZESTRIL) 40 MG tablet Take 40 mg by mouth daily.    . multivitamin-lutein (OCUVITE-LUTEIN) CAPS capsule Take 1 capsule by mouth daily. 30 capsule 0  . gabapentin (NEURONTIN) 300 MG capsule Take 1 capsule (300 mg total) by mouth at bedtime. (Patient not taking: Reported on 11/30/2020) 30 capsule 3  . insulin glargine (LANTUS) 100 unit/mL SOPN Inject 8 Units into the skin at bedtime. (Patient not taking: Reported on 11/30/2020)    . oxyCODONE (OXY IR/ROXICODONE) 5 MG immediate release tablet Take 1 tablet (5 mg total) by mouth every 6 (six) hours as needed for moderate pain or severe pain. (Patient not taking: Reported on 11/30/2020) 15 tablet 0  .  traZODone (DESYREL) 50 MG tablet Take by mouth. (Patient not taking: Reported on 11/30/2020)     No current facility-administered medications for this visit.    Past Medical History:  Diagnosis Date  . Abscess 10/05/15  . Diabetes mellitus without complication (HCC)   . 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 Hyperlipidemia   . Hypertension     Past Surgical History:  Procedure Laterality Date  . AMPUTATION Left 06/23/2020   Procedure: AMPUTATION ABOVE KNEE;  Surgeon: Annice Needy, MD;  Location: [facility] ORS;  Service: General;  Laterality: Left;  . LOWER EXTREMITY ANGIOGRAPHY Left 04/09/2019   Procedure: LOWER EXTREMITY ANGIOGRAPHY;  Surgeon: Annice Needy, MD;  Location: [facility] IN[facility]ASIVE CV [facility];  Service: Cardiovascular;  Laterality: Left;  . LOWER EXTREMITY ANGIOGRAPHY Left 04/19/2020   Procedure: Lower Extremity Angiography;  Surgeon: Annice Needy, MD;  Location: [facility] CV [facility];  Service: Cardiovascular;  Laterality: Left;  . LOWER EXTREMITY ANGIOGRAPHY Left 04/20/2020   Procedure: Lower Extremity Angiography;  Surgeon: Annice Needy, MD;  Location: [facility];  Service: Cardiovascular;  Laterality: Left;  . LOWER EXTREMITY INTERVENTION N/A 05/30/2019   Procedure: LOWER EXTREMITY INTERVENTION;  Surgeon: Renford Dills, MD;  Location: [facility];  Service: Cardiovascular;  Laterality: N/A;  . WRIST SURGERY Left      Social  History   Tobacco Use  . Smoking status: Never Smoker  . Smokeless tobacco: Never Used  Vaping Use  . Vaping Use: Never used  Substance Use Topics  . Alcohol use: No  . Drug use: No      Family History  Problem Relation Age of Onset  . Heart disease Father   . Hyperlipidemia Father   . Hypertension Father   no bleeding or clotting disorders  Allergies  Allergen Reactions  . Penicillins Rash    .Has patient had a PCN reaction causing immediate rash, facial/tongue/throat swelling, SOB or lightheadedness with hypotension: Unknown Has patient had  a PCN reaction causing severe rash involving mucus membranes or skin necrosis: Unknown Has patient had a PCN reaction that required hospitalization: Unknown Has patient had a PCN reaction occurring within the last 10 years: Unknown If all of the above answers are "NO", then may proceed with Cephalosporin use.  Patient Tolerates Cephalosporins      REVIEW OF SYSTEMS (Negative unless checked)  Constitutional: [] Weight loss  [] Fever  [] Chills Cardiac: [] Chest pain   [] Chest pressure   [] Palpitations   [] Shortness of breath when laying flat   [] Shortness of breath at rest   [] Shortness of breath with exertion. Vascular:  [] Pain in legs with walking   [] Pain in legs at rest   [] Pain in legs when laying flat   [] Claudication   [] Pain in feet when walking  [] Pain in feet at rest  [] Pain in feet when laying flat   [] History of DVT   [] Phlebitis   [] Swelling in legs   [] Varicose veins   [] Non-healing ulcers Pulmonary:   [] Uses home oxygen   [] Productive cough   [] Hemoptysis   [] Wheeze  [] COPD   [] Asthma Neurologic:  [] Dizziness  [] Blackouts   [] Seizures   [] History of stroke   [] History of TIA  [] Aphasia   [] Temporary blindness   [] Dysphagia   [] Weakness or numbness in arms   [] Weakness or numbness in legs Musculoskeletal:  [x] Arthritis   [] Joint swelling   [] Joint pain   [] Low back pain Hematologic:  [] Easy bruising  [] Easy bleeding   [] Hypercoagulable state   [] Anemic   Gastrointestinal:  [] Blood in stool   [] Vomiting blood  [] Gastroesophageal reflux/heartburn   [] Abdominal pain Genitourinary:  [] Chronic kidney disease   [] Difficult urination  [] Frequent urination  [] Burning with urination   [] Hematuria Skin:  [] Rashes   [] Ulcers   [] Wounds Psychological:  [] History of anxiety   []  History of major depression.  Physical Examination  BP 136/60 (BP Location: Left Arm)   Pulse 86   Resp 16   Wt 149 lb (67.6 kg)   BMI 19.13 kg/m  Gen:  WD/WN, NAD Head: [place]/AT, No temporalis  wasting. Ear/Nose/Throat: Hearing grossly intact, nares w/o erythema or drainage Eyes: Conjunctiva clear. Sclera non-icteric Neck: Supple.  Trachea midline Pulmonary:  Good air movement, no use of accessory muscles.  Cardiac: RRR, no JVD Vascular:  Vessel Right Left  Radial Palpable Palpable           Musculoskeletal: M/S 5/5 throughout. In a wheelchair. No right leg swelling. Left above-knee amputation well-healed. Neurologic: Sensation grossly intact in extremities.  Symmetrical.  Speech is fluent.  Psychiatric: Judgment intact, Mood & affect appropriate for pt's clinical situation. Dermatologic: No rashes or ulcers noted.  No cellulitis or open wounds.       Labs No results found for this or any previous visit (from the past 2160 hour(s)).  Radiology No results found.  Assessment/Plan  Type 2 diabetes mellitus with complication, without long-term current use of insulin (HCC) blood glucose control important in reducing the progression of atherosclerotic disease. Also, involved in wound healing. On appropriate medications.   Hypertension goal BP (blood pressure) < 140/90 blood pressure control important in reducing the progression of atherosclerotic disease. On appropriate oral medications.  PAD (peripheral artery disease) (HCC) To be checked in the spring  Hx of AKA (above knee amputation), left (HCC) This is well-healed and he should proceed with his prosthesis and then physical therapy training for prosthetic training once he receives this. 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 We answered questions about that today.    Festus Barren, MD  11/30/2020 12:39 PM    This note was created with Dragon medical transcription system.  Any errors from dictation are purely unintentional

## 2020-12-30 ENCOUNTER — Telehealth (INDEPENDENT_AMBULATORY_CARE_PROVIDER_SITE_OTHER): Payer: Self-pay

## 2020-12-31 NOTE — Telephone Encounter (Signed)
Patient family member Bonita Quin left a voicemail requesting for physical therapy to sent to [facility]. I spoke with Barbara Cower with [facility] and he left a message informing that they will not be able refill request due to staffing. [facility] has been reach out to but we are waiting on the patient to make [place] aware with current insurance.

## 2021-01-05 ENCOUNTER — Telehealth (INDEPENDENT_AMBULATORY_CARE_PROVIDER_SITE_OTHER): Payer: Self-pay

## 2021-01-05 NOTE — Telephone Encounter (Signed)
Orders were faxed to [facility] on 12/31/20 and they will be starting physical therapy

## 2021-01-05 NOTE — Telephone Encounter (Signed)
Wes with physical therapy from [facility] left a voicemail stating that he went to the patient home for physical therapy evaluation. Wes is requesting verbal order for twice for two weeks and 1 time for 6 weeks. I left a message stating orders were fine per Sheppard Plumber NP.

## 2021-01-20 IMAGING — US VENOUS DOPPLER ULTRASOUND OF LEFT LOWER EXTREMITY
1 series · 14 of 24 positions shown · non-contrast
Comparison: 04/07/2019

CLINICAL DATA: Pain, swelling x2 days.  Recent vascular procedure.

EXAM:
LEFT LOWER EXTREMITY VENOUS DOPPLER ULTRASOUND
TECHNIQUE: Gray-scale sonography with compression, as well as color and duplex
ultrasound, were performed to evaluate the deep venous system from
the level of the common femoral vein through the popliteal and
proximal calf veins.

[Series 1: venous doppler ultrasound of left lower extremity · 0.10mm/px · 14 of 37 slices shown]
[im 1/37]
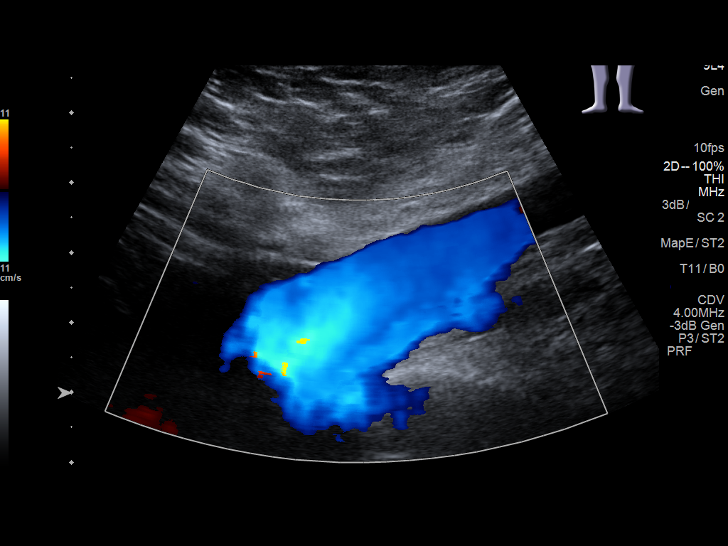
[im 4/37]
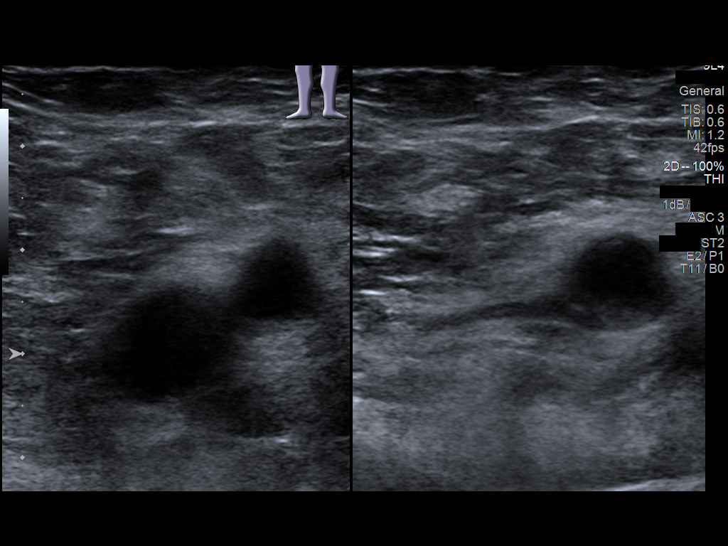
[im 7/37]
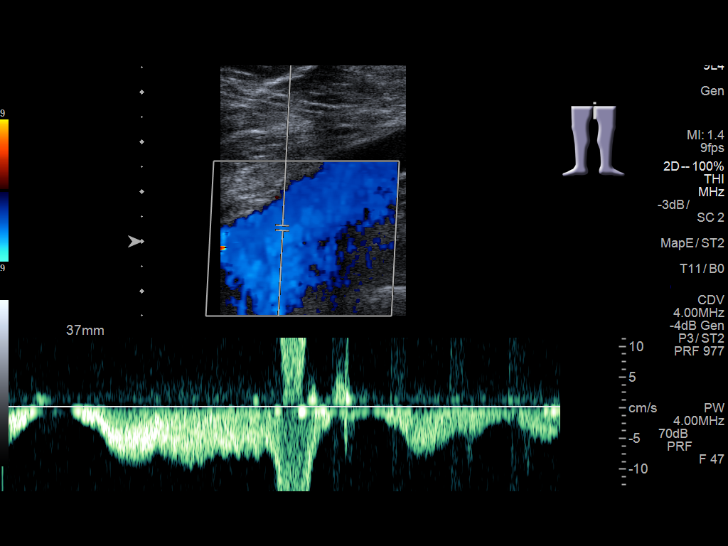
[im 10/37]
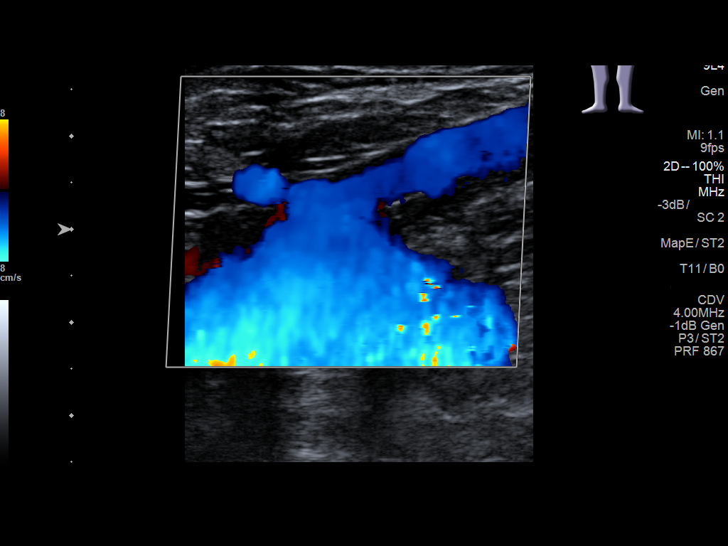
[im 11/37]
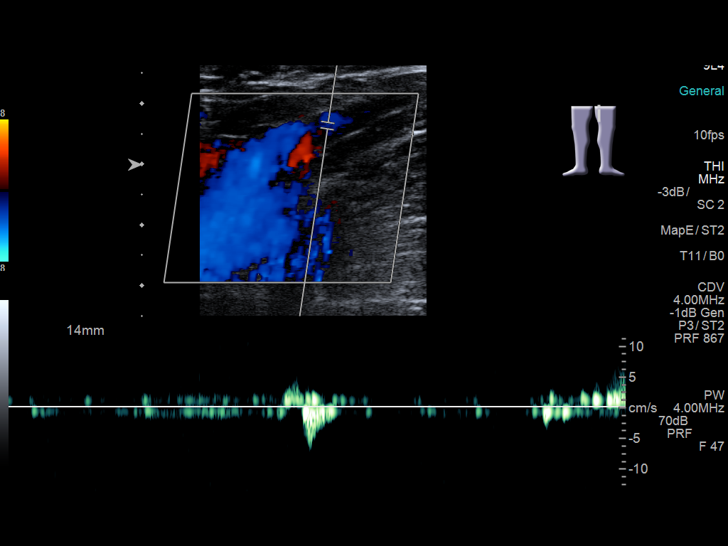
[im 15/37]
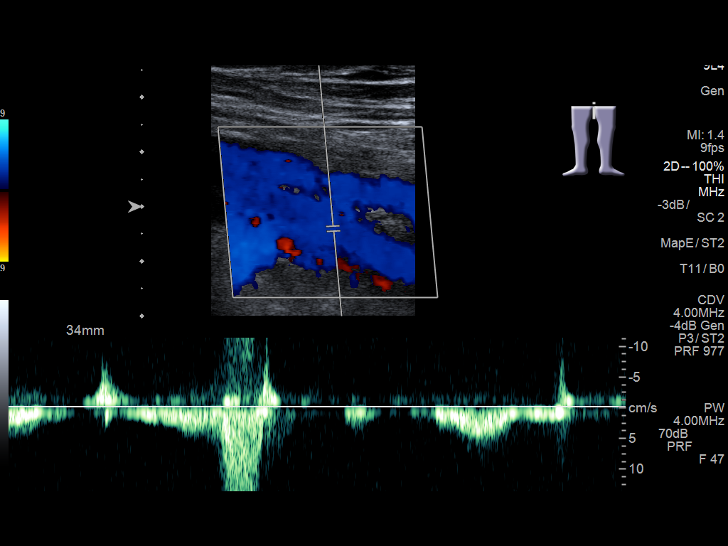
[im 18/37]
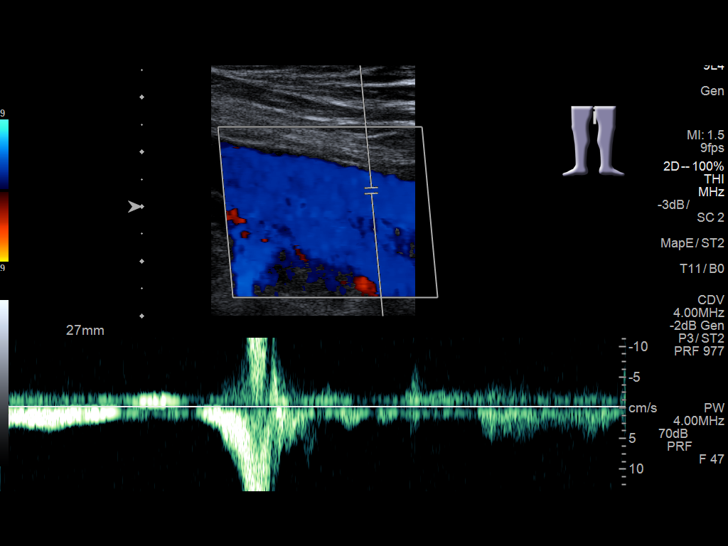
[im 19/37]
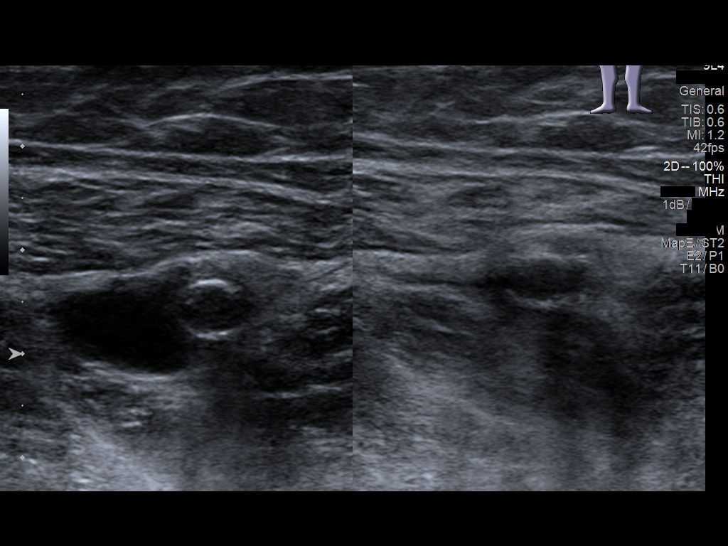
[im 22/37]
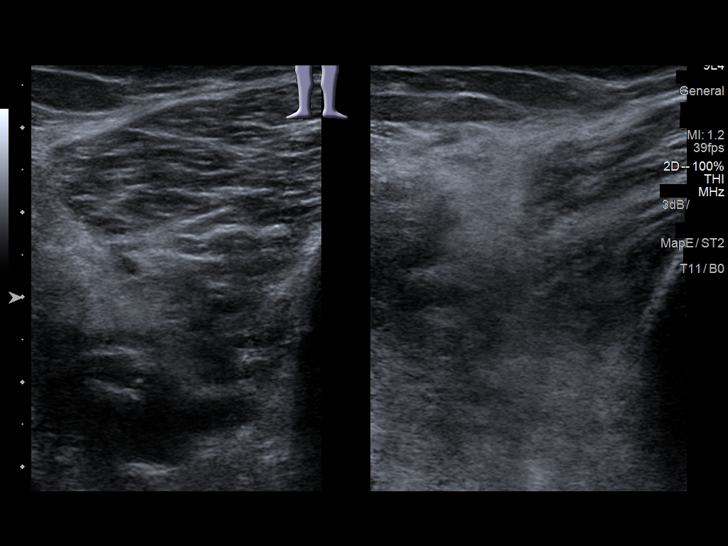
[im 26/37]
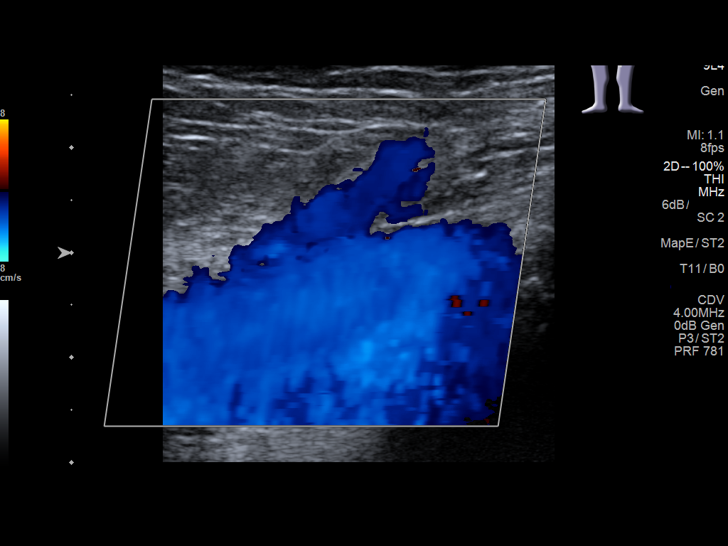
[im 29/37]
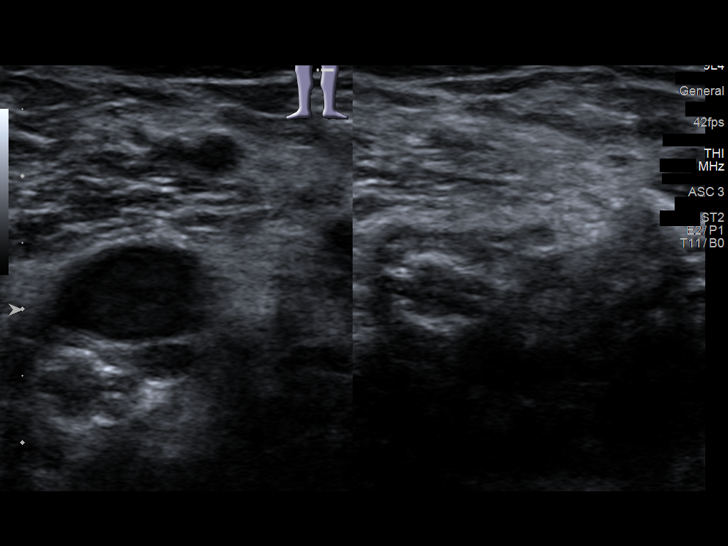
[im 30/37]
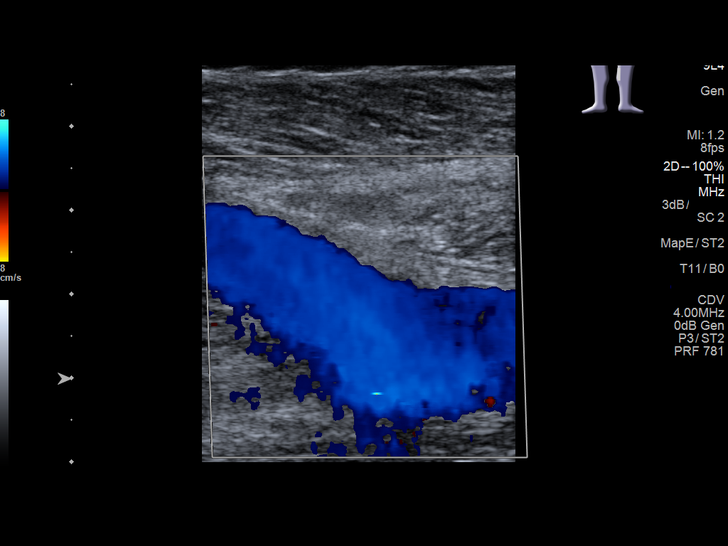
[im 33/37]
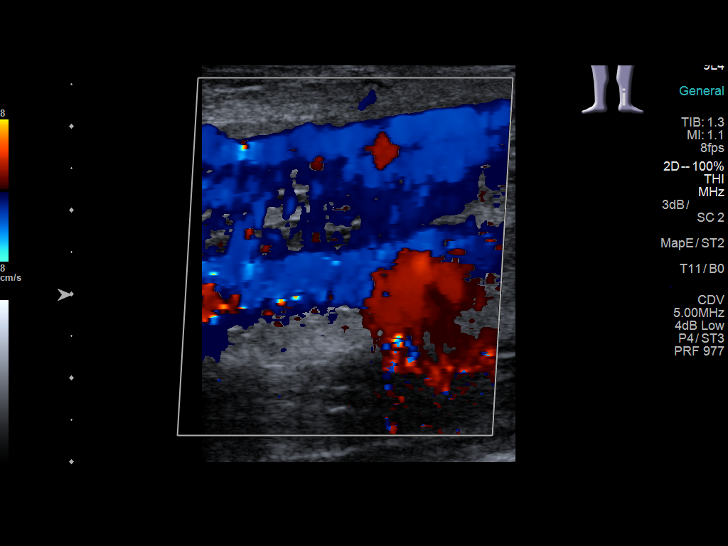
[im 37/37]
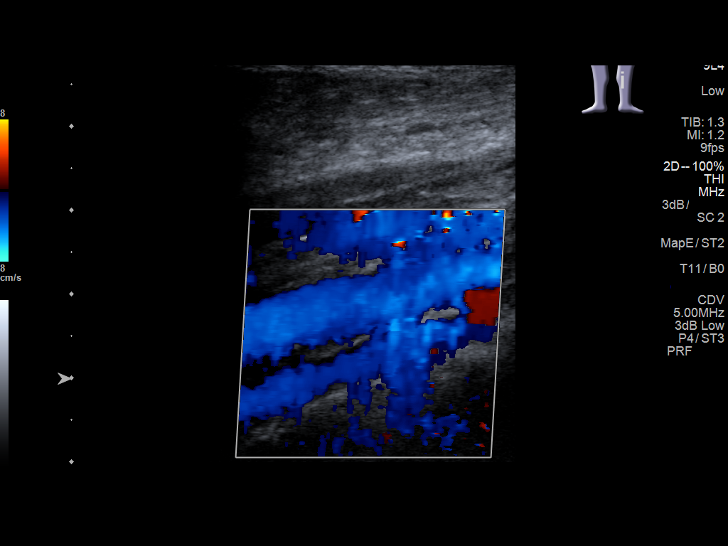

[14 of 24 positions shown; findings below may reference images not displayed]

FINDINGS: Normal compressibility of the common femoral, superficial femoral,
and popliteal veins, as well as the proximal calf veins. No filling
defects to suggest DVT on grayscale or color Doppler imaging.
Doppler waveforms show normal direction of venous flow, normal
respiratory phasicity and response to augmentation. Survey views of
the contralateral common femoral vein are unremarkable.
IMPRESSION: No femoropopliteal and no calf DVT in the visualized calf veins. If
clinical symptoms are inconsistent or if there are persistent or
worsening symptoms, further imaging (possibly involving the iliac
veins) may be warranted.

## 2021-03-07 ENCOUNTER — Telehealth (INDEPENDENT_AMBULATORY_CARE_PROVIDER_SITE_OTHER): Payer: Self-pay

## 2021-03-07 NOTE — Telephone Encounter (Signed)
[place] from [facility] called and left a VM on the nurses line saying that the pt has come up for recertifacation  And he would like to extend him out for once a week for 9 weeks of PT. Please approve pf deny this request.

## 2021-03-07 NOTE — Telephone Encounter (Signed)
That's fine

## 2021-03-08 NOTE — Telephone Encounter (Signed)
I called and made [place] from [facility] aware of the approval.

## 2021-07-08 ENCOUNTER — Encounter (INDEPENDENT_AMBULATORY_CARE_PROVIDER_SITE_OTHER): Payer: Medicaid Other

## 2021-07-08 ENCOUNTER — Ambulatory Visit (INDEPENDENT_AMBULATORY_CARE_PROVIDER_SITE_OTHER): Payer: Medicaid Other | Admitting: Nurse Practitioner

## 2022-08-11 ENCOUNTER — Encounter: Payer: Self-pay | Admitting: *Deleted

## 2022-08-24 ENCOUNTER — Ambulatory Visit (INDEPENDENT_AMBULATORY_CARE_PROVIDER_SITE_OTHER): Payer: Medicaid Other | Admitting: Urology

## 2022-08-24 ENCOUNTER — Encounter: Payer: Self-pay | Admitting: Urology

## 2022-08-24 VITALS — BP 145/78 | HR 80 | Ht 74.0 in | Wt 157.0 lb

## 2022-08-24 DIAGNOSIS — N529 Male erectile dysfunction, unspecified: Secondary | ICD-10-CM

## 2022-08-24 MED ORDER — TADALAFIL 20 MG PO TABS: 20.0000 mg | ORAL_TABLET | ORAL | 6 refills | Status: DC

## 2022-08-24 NOTE — Patient Instructions (Signed)
[email or web address] You can sign up for a free account, and we will send your prescription there  Erectile Dysfunction Erectile dysfunction (ED) is the inability to get or keep an erection in order to have sexual intercourse. ED is considered a symptom of an underlying disorder and is not considered a disease. ED may include: Inability to get an erection. Lack of enough hardness of the erection to allow penetration. Loss of erection before sex is finished. What are the causes? This condition may be caused by: Physical causes, such as: Artery problems. This may include heart disease, high blood pressure, atherosclerosis, and diabetes. Hormonal problems, such as low testosterone. Obesity. Nerve problems. This may include back or pelvic injuries, multiple sclerosis, Parkinson's disease, spinal cord injury, and stroke. Certain medicines, such as: Pain relievers. Antidepressants. Blood pressure medicines and water pills (diuretics). Cancer medicines. Antihistamines. Muscle relaxants. Lifestyle factors, such as: Use of drugs such as marijuana, cocaine, or opioids. Excessive use of alcohol. Smoking. Lack of physical activity or exercise. Psychological causes, such as: Anxiety or stress. Sadness or depression. Exhaustion. Fear about sexual performance. Guilt. What are the signs or symptoms? Symptoms of this condition include: Inability to get an erection. Lack of enough hardness of the erection to allow penetration. Loss of the erection before sex is finished. Sometimes having normal erections, but with frequent unsatisfactory episodes. Low sexual satisfaction in either partner due to erection problems. A curved penis occurring with erection. The curve may cause pain, or the penis may be too curved to allow for intercourse. Never having nighttime or morning erections. How is this diagnosed? This condition is often diagnosed by:  Asking you detailed questions about the  problem. Doing tests, such as: Blood tests to check for diabetes mellitus or high cholesterol, or to measure hormone levels. Other tests to check for underlying health conditions. An ultrasound exam to check for scarring. A test to check blood flow to the penis. Doing a sleep study at home to measure nighttime erections. How is this treated? This condition may be treated by: Medicines, such as: Medicine taken by mouth to help you achieve an erection (oral medicine). Hormone replacement therapy to replace low testosterone levels. Medicine that is injected into the penis. Your health care provider may instruct you how to give yourself these injections at home. Medicine that is delivered with a short applicator tube. The tube is inserted into the opening at the tip of the penis, which is the opening of the urethra. A tiny pellet of medicine is put in the urethra. The pellet dissolves and enhances erectile function. This is also called MUSE (medicated urethral system for erections) therapy. Vacuum pump. This is a pump with a ring on it. The pump and ring are placed on the penis and used to create pressure that helps the penis become erect. Penile implant surgery. In this procedure, you may receive: An inflatable implant. This consists of cylinders, a pump, and a reservoir. The cylinders can be inflated with a fluid that helps to create an erection, and they can be deflated after intercourse. A semi-rigid implant. This consists of two silicone rubber rods. The rods provide some rigidity. They are also flexible, so the penis can both curve downward in its normal position and become straight for sexual intercourse.  Lifestyle changes, such as exercising more, losing weight, and quitting smoking. Follow these instructions at home: Medicines  Take over-the-counter and prescription medicines only as told by your health care provider. Do not increase the dosage without  first discussing it with your  health care provider. If you are using self-injections, do injections as directed by your health care provider. Make sure you avoid any veins that are on the surface of the penis. After giving an injection, apply pressure to the injection site for 5 minutes. Talk to your health care provider about how to prevent headaches while taking ED medicines. These medicines may cause a sudden headache due to the increase in blood flow in your body. General instructions Exercise regularly, as directed by your health care provider. Work with your health care provider to lose weight, if needed. Do not use any products that contain nicotine or tobacco. These products include cigarettes, chewing tobacco, and vaping devices, such as e-cigarettes. If you need help quitting, ask your health care provider. Before using a vacuum pump, read the instructions that come with the pump and discuss any questions with your health care provider. Keep all follow-up visits. This is important. Contact a health care provider if: You feel nauseous. You are vomiting. You get sudden headaches while taking ED medicines. You have any concerns about your sexual health. Get help right away if: You are taking oral or injectable medicines and you have an erection that lasts longer than 4 hours. If your health care provider is unavailable, go to the nearest emergency room for evaluation. An erection that lasts much longer than 4 hours can result in permanent damage to your penis. You have severe pain in your groin or abdomen. You develop redness or severe swelling of your penis. You have redness spreading at your groin or lower abdomen. You are unable to urinate. You experience chest pain or a rapid heartbeat (palpitations) after taking oral medicines. These symptoms may represent a serious problem that is an emergency. Do not wait to see if the symptoms will go away. Get medical help right away. Call your local emergency services (911 in  the [place] Do not drive yourself to the hospital. Summary Erectile dysfunction (ED) is the inability to get or keep an erection during sexual intercourse. This condition is diagnosed based on a physical exam, your symptoms, and tests to determine the cause. Treatment varies depending on the cause and may include medicines, hormone therapy, surgery, or a vacuum pump. You may need follow-up visits to make sure that you are using your medicines or devices correctly. Get help right away if you are taking or injecting medicines and you have an erection that lasts longer than 4 hours. This information is not intended to replace advice given to you by your health care provider. Make sure you discuss any questions you have with your health care provider. Document Revised: 02/23/2021 Document Reviewed: 02/23/2021 Elsevier Patient Education  2023 [employer].

## 2022-08-24 NOTE — Progress Notes (Signed)
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  His diabetes has been controlled much better recently, and recent hemoglobin A1c was less than 6.  He has tried testosterone gel, a few doses of oral Cialis, and 100 mg sildenafil on demand without any improvement in his erections.  He had nausea and stomach discomfort from the Cialis, but only tried this a few times.  He has also been using a penile ring with only minimal benefit.  Outside records from [facility] were reviewed.   PMH: Past Medical History:  Diagnosis Date   Abscess 10/05/15   Diabetes mellitus without complication (HCC)    Herpes exposure    Hyperlipidemia    Hypertension     Surgical History: Past Surgical History:  Procedure Laterality Date   AMPUTATION Left 06/23/2020   Procedure: AMPUTATION ABOVE KNEE;  Surgeon: Annice Needy, MD;  Location: [facility] ORS;  Service: General;  Laterality: Left;   LOWER EXTREMITY ANGIOGRAPHY Left 04/09/2019   Procedure: LOWER EXTREMITY ANGIOGRAPHY;  Surgeon: Annice Needy, MD;  Location: [facility];  Service: Cardiovascular;  Laterality: Left;   LOWER EXTREMITY ANGIOGRAPHY Left 04/19/2020   Procedure: Lower Extremity Angiography;  Surgeon: Annice Needy, MD;  Location: [facility];  Service: Cardiovascular;  Laterality: Left;   LOWER EXTREMITY ANGIOGRAPHY Left 04/20/2020   Procedure: Lower Extremity Angiography;  Surgeon: Annice Needy, MD;  Location: [facility];  Service: Cardiovascular;  Laterality: Left;   LOWER EXTREMITY INTERVENTION N/A 05/30/2019   Procedure: LOWER EXTREMITY INTERVENTION;  Surgeon: Renford Dills, MD;  Location: [facility];  Service: Cardiovascular;  Laterality: N/A;   WRIST SURGERY Left        Family History: Family History  Problem Relation Age of Onset   Heart disease Father    Hyperlipidemia Father    Hypertension Father     Social History:  reports that he has never smoked. He has never been exposed to tobacco smoke. He has never used smokeless tobacco. He reports that he does not drink alcohol and does not use drugs.  Physical Exam: BP (!) 145/78   Pulse 80   Ht 6\' 2"  (1.88 m)   Wt 157 lb (71.2 kg)   BMI 20.16 kg/m    Constitutional:  Alert and oriented, No acute distress.  In wheelchair, left AKA Cardiovascular: No clubbing, cyanosis, or edema. Respiratory: Normal respiratory effort, no increased work of breathing. GI: Abdomen is soft, nontender, nondistended, no abdominal masses  Assessment & Plan:   56 year old male with history of poorly controlled type 2 diabetes, since improved significantly with normal hemoglobin A1c who reports problems with erections over the last few years.  We reviewed the AUA guidelines regarding evaluation and management of patients with erectile dysfunction.  Most likely etiology of his ED is his history of poorly controlled diabetes.  I recommended a trial of Cialis 20 mg every other day, and this can be taken with food to see if it improves his stomach discomfort.  If no improvement on PDE5 inhibitors, other alternatives would include vacuum erection device, penile injections, or penile prosthesis.  Risks and benefits were discussed.  Trial of Cialis 20 mg every other day RTC with PA 1 month symptom check, consider penile injections or referral for  penile prosthesis if persistent ED at that time  Legrand Rams, MD 08/24/2022  [facility] [address], [address] [place], [place] [address] [phone]

## 2022-09-26 ENCOUNTER — Ambulatory Visit: Payer: Medicaid Other | Admitting: Urology

## 2022-10-05 NOTE — Progress Notes (Signed)
10/06/2022 12:00 PM   Jeremy Padilla 18-Nov-1966 [id number]  Referring provider: Fayrene Helper, NP [address] [place],  [place] [address]  Urological history: 1.  Erectile dysfunction -Contributing factors of age, testosterone deficiency, diabetes, hyperlipidemia, hypertension, PAD and stroke -SHIM 5 -Tadalafil 20 mg, on-demand dosing  Chief Complaint  Patient presents with   Erectile Dysfunction    HPI: Jeremy Padilla is a 56 y.o. male who presents today for a follow-up after trial of 20 mg tadalafil.  He did not find the tadalafil effective.  He states he only experienced penile fullness.  Patient is not having spontaneous erections.  He denies any pain or curvature with erections.     SHIM     Row Name 10/06/22 1112         SHIM: Over the last 6 months:   How do you rate your confidence that you could get and keep an erection? Very Low     When you had erections with sexual stimulation, how often were your erections hard enough for penetration (entering your partner)? Almost Never or Never     During sexual intercourse, how often were you able to maintain your erection after you had penetrated (entered) your partner? Almost Never or Never     During sexual intercourse, how difficult was it to maintain your erection to completion of intercourse? Extremely Difficult     When you attempted sexual intercourse, how often was it satisfactory for you? Almost Never or Never       SHIM Total Score   SHIM 5              Score: 1-7 Severe ED 8-11 Moderate ED 12-16 Mild-Moderate ED 17-21 Mild ED 22-25 No ED    PMH: Past Medical History:  Diagnosis Date   Abscess 10/05/15   Diabetes mellitus without complication (HCC)    Herpes exposure    Hyperlipidemia    Hypertension     Surgical History: Past Surgical History:  Procedure Laterality Date   AMPUTATION Left 06/23/2020   Procedure: AMPUTATION ABOVE KNEE;  Surgeon: Annice Needy, MD;   Location: [facility];  Service: General;  Laterality: Left;   LOWER EXTREMITY ANGIOGRAPHY Left 04/09/2019   Procedure: LOWER EXTREMITY ANGIOGRAPHY;  Surgeon: Annice Needy, MD;  Location: [facility];  Service: Cardiovascular;  Laterality: Left;   LOWER EXTREMITY ANGIOGRAPHY Left 04/19/2020   Procedure: Lower Extremity Angiography;  Surgeon: Annice Needy, MD;  Location: [facility];  Service: Cardiovascular;  Laterality: Left;   LOWER EXTREMITY ANGIOGRAPHY Left 04/20/2020   Procedure: Lower Extremity Angiography;  Surgeon: Annice Needy, MD;  Location: [facility];  Service: Cardiovascular;  Laterality: Left;   LOWER EXTREMITY INTERVENTION N/A 05/30/2019   Procedure: LOWER EXTREMITY INTERVENTION;  Surgeon: Renford Dills, MD;  Location: [facility];  Service: Cardiovascular;  Laterality: N/A;   WRIST SURGERY Left     Home Medications:  Allergies as of 10/06/2022       Reactions   Penicillins Rash   .Has patient had a PCN reaction causing immediate rash, facial/tongue/throat swelling, SOB or lightheadedness with hypotension: Unknown Has patient had a PCN reaction causing severe rash involving mucus membranes or skin necrosis: Unknown Has patient had a PCN reaction that required hospitalization: Unknown Has patient had a PCN reaction occurring within the last 10 years: Unknown If all of the above answers are "NO", then may proceed with Cephalosporin use. Patient  Tolerates Cephalosporins        Medication List        Accurate as of October 06, 2022 12:00 PM. If you have any questions, ask your nurse or doctor.          AMBULATORY NON FORMULARY MEDICATION Trimix (30/1/10)-(Pap/Phent/PGE)  Test Dose  68ml vial   [address] [place] [phone] Fax [phone] Started by: Zara Council, PA-C   amLODipine 2.5 MG tablet Commonly known as: NORVASC Take 2.5 mg by mouth daily.   ascorbic acid 250 MG tablet Commonly known  as: VITAMIN C Take 1 tablet (250 mg total) by mouth 2 (two) times daily.   aspirin EC 81 MG tablet Take 81 mg by mouth daily.   ferrous sulfate 325 (65 FE) MG tablet Take 1 tablet (325 mg total) by mouth 2 (two) times daily with a meal.   glipiZIDE 5 MG 24 hr tablet Commonly known as: GLUCOTROL XL Take 5 mg by mouth daily.   hydrochlorothiazide 25 MG tablet Commonly known as: HYDRODIURIL Take 25 mg by mouth daily.   lisinopril 40 MG tablet Commonly known as: ZESTRIL Take 40 mg by mouth daily.   multivitamin-lutein Caps capsule Take 1 capsule by mouth daily.   rosuvastatin 20 MG tablet Commonly known as: CRESTOR Take 20 mg by mouth at bedtime.   tadalafil 20 MG tablet Commonly known as: CIALIS Take 1 tablet (20 mg total) by mouth every other day.   Testosterone 1.62 % Gel SMARTSIG:40.5 Milligram(s) Topical Every Morning        Allergies:  Allergies  Allergen Reactions   Penicillins Rash    .Has patient had a PCN reaction causing immediate rash, facial/tongue/throat swelling, SOB or lightheadedness with hypotension: Unknown Has patient had a PCN reaction causing severe rash involving mucus membranes or skin necrosis: Unknown Has patient had a PCN reaction that required hospitalization: Unknown Has patient had a PCN reaction occurring within the last 10 years: Unknown If all of the above answers are "NO", then may proceed with Cephalosporin use.  Patient Tolerates Cephalosporins     Family History: Family History  Problem Relation Age of Onset   Heart disease Father    Hyperlipidemia Father    Hypertension Father     Social History:  reports that he has never smoked. He has never been exposed to tobacco smoke. He has never used smokeless tobacco. He reports that he does not drink alcohol and does not use drugs.  ROS: Pertinent ROS in HPI  Physical Exam: BP 135/78   Pulse 89   Ht 6\' 2"  (1.88 m)   Wt 157 lb (71.2 kg)   BMI 20.16 kg/m    Constitutional:  Well nourished. Alert and oriented, No acute distress. HEENT: [place] AT, moist mucus membranes.  Trachea midline Cardiovascular: No clubbing, cyanosis, or edema. Respiratory: Normal respiratory effort, no increased work of breathing. Neurologic: Grossly intact, no focal deficits, moving all 4 extremities. Psychiatric: Normal mood and affect.  Laboratory Data: N/A   Pertinent Imaging: N/A  Assessment & Plan:    1. Erectile dysfunction -failed PDE5i's -testosterone level pending -We discussed intra-urethral suppositories, intracavernous vasoactive drug injection therapy, vacuum erection devices, LI-ESWT and penile prosthesis implantation  -he would like to proceed with the ICI titration at this time -script sent for Trimix test dose to [facility]  Return for schedule trimix test dose.  These notes generated with voice recognition software. I apologize for typographical errors.  Christopherjohn Schiele,  PA-C  [facility] [address]  [address] [place], [place] [address] [phone]  I spent 30 minutes on the day of the encounter to include pre-visit record review, face-to-face time with the patient, and post-visit ordering of tests.

## 2022-10-06 ENCOUNTER — Ambulatory Visit (INDEPENDENT_AMBULATORY_CARE_PROVIDER_SITE_OTHER): Payer: Medicaid Other | Admitting: Urology

## 2022-10-06 ENCOUNTER — Encounter: Payer: Self-pay | Admitting: Urology

## 2022-10-06 VITALS — BP 135/78 | HR 89 | Ht 74.0 in | Wt 157.0 lb

## 2022-10-06 DIAGNOSIS — N529 Male erectile dysfunction, unspecified: Secondary | ICD-10-CM

## 2022-10-06 MED ORDER — AMBULATORY NON FORMULARY MEDICATION: 0 refills | Status: DC

## 2022-10-07 LAB — TESTOSTERONE: Testosterone: 279 ng/dL (ref 264–916)

## 2022-10-09 ENCOUNTER — Telehealth: Payer: Self-pay

## 2022-10-09 NOTE — Telephone Encounter (Signed)
-----   Message from Nori Riis, PA-C sent at 10/09/2022  7:59 AM EDT ----- Please let Mr. Dowdy know that his testosterone returned below 300, but it was drawn after 10 am.  We will need to repeat again before 10 am for conformation.  We can do this at his next appointment.

## 2022-10-09 NOTE — Telephone Encounter (Signed)
LMOM for pt to return call. No DPR on file, unable to leave results.

## 2022-10-11 NOTE — Progress Notes (Unsigned)
   10/12/2022 10:32 AM  Jeremy Padilla [birth year] [id number]   Referring provider: Danelle Berry, NP [address] [place],  [place] [address]  Urological history: 1.  Erectile dysfunction -Contributing factors of age, testosterone deficiency, diabetes, hyperlipidemia, hypertension, PAD and stroke -SHIM 5 -Failed PDE 5 inhibitors  Chief Complaint  Patient presents with   Erectile Dysfunction    HPI: Jeremy Padilla is a 56 y.o. male who presents today for ICI titration.    The procedure is discussed with patient.  He is allowed to ask questions.  Questions were answered to his satisfaction.  We were able to complete the titration.  Physical Exam:  BP (!) 142/82   Pulse 90   Ht 6\' 2"  (1.88 m)   Wt 157 lb (71.2 kg)   BMI 20.16 kg/m   Constitutional:  Well nourished. Alert and oriented, No acute distress. GU: No CVA tenderness.  No bladder fullness or masses.  Patient with circumcised/uncircumcised phallus.  Urethral meatus is patent.  No penile discharge. No penile lesions or rashes. Scrotum without lesions, cysts, rashes and/or edema.  Psychiatric: Normal mood and affect.   Procedure  Patient's left corpus cavernosum is identified.  An area near the base of the penis is cleansed with rubbing alcohol.  Careful to avoid the dorsal vein, 0.2 cc of Trimix (papaverine 30 mg, phentolamine 1 mg and prostaglandin E1 10 mcg, Lot # [id number]  exp # 11/20/2022 is injected at a 90 degree angle into the left corpus cavernosum near the base of the penis.  Patient experienced penile fullness in 15 minutes.    Patient's right corpus cavernosum is identified.  An area near the base of the penis is cleansed with rubbing alcohol.  Careful to avoid the dorsal vein, 0.2 cc of Trimix (papaverine 30 mg, phentolamine 1 mg and prostaglandin E1 10 mcg, Lot # [id number]  exp # 11/20/2022 is injected at a 90 degree angle into the right corpus cavernosum near the base of the penis.  Patient  experienced penile fullness in 15 minutes.    Patient's left corpus cavernosum is identified.  An area near the base of the penis is cleansed with rubbing alcohol.  Careful to avoid the dorsal vein, 0.2 cc of Trimix (papaverine 30 mg, phentolamine 1 mg and prostaglandin E1 10 mcg, Lot # [id number]  exp # 11/20/2022 is injected at a 90 degree angle into the left corpus cavernosum near the base of the penis.  Patient experienced penile fullness in 15 minutes.    Assessment & Plan:    1. Erectile dysfunction -testosterone pending -He will return next week for another titration starting at 0.4 cc of the Trimix -He is advised regarding symptoms of priapism and instructed to contact 25/10/2022 if he should experience any of these immediately    Return in about 1 week (around 10/19/2022) for ICI titration .  25/10/2022  [facility] [address] [place], [place] [address] [phone]  I spent 15 minutes on the day of the encounter to include pre-visit record review, face-to-face time with the patient, and post-visit ordering of tests.

## 2022-10-12 ENCOUNTER — Ambulatory Visit (INDEPENDENT_AMBULATORY_CARE_PROVIDER_SITE_OTHER): Payer: Medicare Other | Admitting: Urology

## 2022-10-12 ENCOUNTER — Encounter: Payer: Self-pay | Admitting: Urology

## 2022-10-12 VITALS — BP 142/82 | HR 90 | Ht 74.0 in | Wt 157.0 lb

## 2022-10-12 DIAGNOSIS — N529 Male erectile dysfunction, unspecified: Secondary | ICD-10-CM

## 2022-10-12 NOTE — Patient Instructions (Signed)

## 2022-10-13 ENCOUNTER — Telehealth: Payer: Self-pay | Admitting: Family Medicine

## 2022-10-13 LAB — TESTOSTERONE: Testosterone: 415 ng/dL (ref 264–916)

## 2022-10-13 NOTE — Telephone Encounter (Signed)
-----   Message from Nori Riis, PA-C sent at 10/13/2022  8:45 AM EDT ----- Please let Mr. Harpole know that his testosterone level is good.

## 2022-10-13 NOTE — Telephone Encounter (Signed)
Patient notified and voiced understanding.

## 2022-10-17 NOTE — Progress Notes (Unsigned)
10/18/2022 10:21 AM  Jeremy Padilla [birth year] [id number]   Referring provider: Fayrene Helper, NP [address] [place],  [place] [address]  Urological history: 1.  Erectile dysfunction -Contributing factors of age, testosterone deficiency, diabetes, hyperlipidemia, hypertension, PAD and stroke -SHIM 5 -Failed PDE 5 inhibitors  Chief Complaint  Patient presents with   Erectile Dysfunction    HPI: Jeremy Padilla is a 56 y.o. male who presents today for ICI titration with his friend, Drinda Butts.   The procedure is discussed with patient.  He is allowed to ask questions.  Questions were answered to his satisfaction.  We were able to complete the titration.  Physical Exam:  BP 138/78   Pulse 72   Ht 6\' 2"  (1.88 m)   Wt 157 lb (71.2 kg)   BMI 20.16 kg/m   Constitutional:  Well nourished. Alert and oriented, No acute distress. HEENT: [place] AT, moist mucus membranes.  Trachea midline Cardiovascular: No clubbing, cyanosis, or edema. Respiratory: Normal respiratory effort, no increased work of breathing. GU: No CVA tenderness.  No bladder fullness or masses.  Patient with circumcised phallus.  Urethral meatus is patent.  No penile discharge. No penile lesions or rashes. Scrotum without lesions, cysts, rashes and/or edema.   Neurologic: Grossly intact, no focal deficits, moving all 4 extremities. Psychiatric: Normal mood and affect.   Procedure  Patient's left corpus cavernosum is identified.  An area near the base of the penis is cleansed with rubbing alcohol.  Careful to avoid the dorsal vein, 0.4 cc of Trimix (papaverine 30 mg, phentolamine 1 mg and prostaglandin E1 10 mcg, Lot # [id number]  exp # 11/20/2022 is injected at a 90 degree angle into the left corpus cavernosum near the base of the penis.  Patient experienced penile fullness in 15 minutes.    Patient's right corpus cavernosum is identified.  An area near the base of the penis is cleansed with rubbing alcohol.   Careful to avoid the dorsal vein, 0.2 cc of Trimix (papaverine 30 mg, phentolamine 1 mg and prostaglandin E1 10 mcg, Lot # [id number]  exp # 11/20/2022 is injected at a 90 degree angle into the right corpus cavernosum near the base of the penis.  Patient experienced penile fullness in 15 minutes.    Assessment & Plan:    1. 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 [id number] at [facility] for further evaluation and discussion concerning IPP, but he and his friend were not ready for that conversation at this time -Advised patient of the condition of priapism, painful erection lasting for more than four hours, and to contact the office  or seek treatment in the ED immediately    Return for patient to call for next appointment .  Laneta Simmers  [facility] [address] [place], [place] [address] [phone]  I spent 30 minutes on the day of the encounter to include pre-visit record review, face-to-face time with the patient, and post-visit ordering of tests.

## 2022-10-18 ENCOUNTER — Ambulatory Visit (INDEPENDENT_AMBULATORY_CARE_PROVIDER_SITE_OTHER): Payer: Medicare Other | Admitting: Urology

## 2022-10-18 ENCOUNTER — Encounter: Payer: Self-pay | Admitting: Urology

## 2022-10-18 VITALS — BP 138/78 | HR 72 | Ht 74.0 in | Wt 157.0 lb

## 2022-10-18 DIAGNOSIS — N529 Male erectile dysfunction, unspecified: Secondary | ICD-10-CM | POA: Diagnosis not present

## 2022-10-18 NOTE — Patient Instructions (Signed)
TRIMIX SELF-INJECTION INSTRUCTIONS    DETAILED PROCEDURE  1. GETTING SET UP  A. Proper hygiene is important. Wash your hands and keep the penis clean.  B. Assemble the following:  - Bottle of Trimix  - Alcohol pad  - Syringe  C. Keep the Trimix cold by returning the bottle to the refrigerator, or by placing the bottle in a cup of ice.   2. PREPARE THE SYRINGE  A. Wipe the rubber top of the vial with an alcohol pad.  B. After removing the cap of the needle, pull the plunger back to the desired dosage, filling this volume with air. Use a new needle and syringe each time.  C. Insert the needle through the rubber top and inject the air into the vial.  D. Turn the vial with needle and syringe inserted upside down. Pull back on the syringe plunger in a slow and steady motion until the desired dosage is achieved.  E. Tap the side of the syringe (1cc tuberculin syringe with a 29 gauge needle) to allow any air bubbles to float towards the needle. Avoid having these air bubbles in the syringe when self-injecting by first injecting out the collected bubbles that may form.  F. Remove the needle from the bottle and replace the protective cap on the needle.    3. SELECT AND PREPARE THE SITE FOR INJECTION  A. The proper location for injection is at the 9-11 and 1-3 o'clock positions, between the base and mid-portion of the penis.(see diagram) Avoid the midline because of potential for injury to the urethra (6 o'clock; for urinary passage) and the penile arteries and nerves (near 12 o'clock). Avoid any visible veins or arteries on the surface.  B. Grasp and pull the head of the penis toward the side of your leg with the index finger and thumb (use the left hand, if right handed). While maintaining light tension, select a site for injection.  C. Clean the site with an alcohol pad.   4: INJECT TRIMIX AND APPLY COMPRESSION  A. With a steady and continuous motion, penetrate the skin with the needle at a 90 o  angle. The needle should then be advanced to the hub. Slight resistance is encountered as the needle passes into the proper position within the erectile tissue (corporeal body).  B. Inject the Trimix over approximately 4 seconds. Withdraw the needle from the penis and apply compression to the injection site for approximately 1 minute. Several minutes of compression may be required to avoid bleeding, especially if you are an aspirin user.  C. Replace the cap on the needle and dispose of properly.   If you experience a painful erection that will not go down, take four (30 mg) tablets of pseudoephedrine (Sudafed-not the extended release) and if the erection does not go down in the next hour or increases in pain, contact the office immediately or seek treatment in the ED    

## 2023-01-17 ENCOUNTER — Telehealth: Payer: Self-pay | Admitting: Urology

## 2023-01-17 ENCOUNTER — Other Ambulatory Visit: Payer: Self-pay | Admitting: Urology

## 2023-01-17 DIAGNOSIS — N529 Male erectile dysfunction, unspecified: Secondary | ICD-10-CM

## 2023-01-17 NOTE — Telephone Encounter (Signed)
You saw pt in 10/2022 Pt stopped by office and would now like a referral to Dr. Ollen Gross at [facility] for further evaluation and discussion concerning IPP, as discussed at previous appt.

## 2023-01-24 ENCOUNTER — Ambulatory Visit (LOCAL_COMMUNITY_HEALTH_CENTER): Payer: Medicare Other

## 2023-01-24 DIAGNOSIS — Z23 Encounter for immunization: Secondary | ICD-10-CM | POA: Diagnosis not present

## 2023-01-24 DIAGNOSIS — Z7185 Encounter for immunization safety counseling: Secondary | ICD-10-CM

## 2023-01-24 NOTE — Progress Notes (Signed)
  Are you feeling sick today? No   Have you ever received a dose of COVID-19 Vaccine? [employer], [place], [place], [place], Other) Yes  If yes, which vaccine and how many doses?   Moderna and 2 doses   Did you bring the vaccination record card or other documentation?  Yes   Do you have a health condition or are undergoing treatment that makes you moderately or severely immunocompromised? This would include, but not be limited to: cancer, HIV, organ transplant, immunosuppressive therapy/high-dose corticosteroids, or moderate/severe primary immunodeficiency.  No  Have you received COVID-19 vaccine before or during hematopoietic cell transplant (HCT) or CAR-T-cell therapies? No  Have you ever had an allergic reaction to: (This would include a severe allergic reaction or a reaction that caused hives, swelling, or respiratory distress, including wheezing.) A component of a COVID-19 vaccine or a previous dose of COVID-19 vaccine? No   Have you ever had an allergic reaction to another vaccine (other thanCOVID-19 vaccine) or an injectable medication? (This would include a severe allergic reaction or a reaction that caused hives, swelling, or respiratory distress, including wheezing.)   No    Do you have a history of any of the following:  Myocarditis or Pericarditis No  Dermal fillers:  No  Multisystem Inflammatory Syndrome (MIS-C or MIS-A)? No  COVID-19 disease within the past 3 months? No  Vaccinated with monkeypox vaccine in the last 4 weeks? No  Pt requests [place] [phone] (12 yrs+) and vaccine given and tolerated well. Updated [facility] copy and covid card given. Stayed for 15 mi observation without problem. Josie Saunders, RN

## 2023-02-01 ENCOUNTER — Encounter: Payer: Self-pay | Admitting: Nurse Practitioner

## 2023-02-09 ENCOUNTER — Ambulatory Visit (LOCAL_COMMUNITY_HEALTH_CENTER): Payer: Medicare Other

## 2023-02-09 DIAGNOSIS — Z23 Encounter for immunization: Secondary | ICD-10-CM

## 2023-02-09 DIAGNOSIS — Z7185 Encounter for immunization safety counseling: Secondary | ICD-10-CM

## 2023-02-09 NOTE — Progress Notes (Signed)
In nurse clinic for flu vaccine which was given and tolerated well. Updated [facility] copy given. Josie Saunders, RN

## 2023-03-02 ENCOUNTER — Telehealth: Payer: Self-pay

## 2023-03-02 NOTE — Telephone Encounter (Signed)
Patient called asking for you to send in rx for a sleeve for his prostetic leg

## 2023-03-13 ENCOUNTER — Other Ambulatory Visit: Payer: Self-pay | Admitting: Nurse Practitioner

## 2023-03-13 ENCOUNTER — Other Ambulatory Visit: Payer: Medicare Other

## 2023-03-13 NOTE — Telephone Encounter (Signed)
Informed and will get with patient tomorrow at his appt

## 2023-03-14 ENCOUNTER — Ambulatory Visit: Payer: Medicare Other | Admitting: Nurse Practitioner

## 2023-03-14 LAB — COMPREHENSIVE METABOLIC PANEL
ALT: 14 IU/L (ref 0–44)
AST: 16 IU/L (ref 0–40)
Albumin/Globulin Ratio: 2.3 — ABNORMAL HIGH (ref 1.2–2.2)
Albumin: 4.5 g/dL (ref 3.8–4.9)
Alkaline Phosphatase: 116 IU/L (ref 44–121)
BUN/Creatinine Ratio: 30 — ABNORMAL HIGH (ref 9–20)
BUN: 34 mg/dL — ABNORMAL HIGH (ref 6–24)
Bilirubin Total: 0.3 mg/dL (ref 0.0–1.2)
CO2: 24 mmol/L (ref 20–29)
Calcium: 9.7 mg/dL (ref 8.7–10.2)
Chloride: 101 mmol/L (ref 96–106)
Creatinine, Ser: 1.14 mg/dL (ref 0.76–1.27)
Globulin, Total: 2 g/dL (ref 1.5–4.5)
Glucose: 120 mg/dL — ABNORMAL HIGH (ref 70–99)
Potassium: 4.6 mmol/L (ref 3.5–5.2)
Sodium: 139 mmol/L (ref 134–144)
Total Protein: 6.5 g/dL (ref 6.0–8.5)
eGFR: 75 mL/min/{1.73_m2} (ref >59)

## 2023-03-14 LAB — LIPID PANEL W/O CHOL/HDL RATIO
Cholesterol, Total: 174 mg/dL (ref 100–199)
HDL: 52 mg/dL (ref >39)
LDL Chol Calc (NIH): 104 mg/dL — ABNORMAL HIGH (ref 0–99)
Triglycerides: 98 mg/dL (ref 0–149)
VLDL Cholesterol Cal: 18 mg/dL (ref 5–40)

## 2023-03-14 LAB — HGB A1C W/O EAG: Hgb A1c MFr Bld: 5.5 % (ref 4.8–5.6)

## 2023-03-14 LAB — TSH: TSH: 1.35 u[IU]/mL (ref 0.450–4.500)

## 2023-03-20 ENCOUNTER — Encounter: Payer: Self-pay | Admitting: Nurse Practitioner

## 2023-03-20 ENCOUNTER — Ambulatory Visit (INDEPENDENT_AMBULATORY_CARE_PROVIDER_SITE_OTHER): Payer: Medicare Other | Admitting: Nurse Practitioner

## 2023-03-20 VITALS — BP 140/70 | HR 79 | Ht 74.0 in | Wt 161.2 lb

## 2023-03-20 DIAGNOSIS — E782 Mixed hyperlipidemia: Secondary | ICD-10-CM

## 2023-03-20 DIAGNOSIS — E1165 Type 2 diabetes mellitus with hyperglycemia: Secondary | ICD-10-CM

## 2023-03-20 DIAGNOSIS — E1139 Type 2 diabetes mellitus with other diabetic ophthalmic complication: Secondary | ICD-10-CM

## 2023-03-20 DIAGNOSIS — D229 Melanocytic nevi, unspecified: Secondary | ICD-10-CM | POA: Diagnosis not present

## 2023-03-20 MED ORDER — SIMVASTATIN 20 MG PO TABS: 20.0000 mg | ORAL_TABLET | Freq: Every day | ORAL | 0 refills | Status: DC

## 2023-03-20 NOTE — Patient Instructions (Addendum)
1) Fill out paperwork for [employer] sleeve for entire left leg  prostethetic 2) Recheck fasting labs in 4 months, A1c at 5.5%, pt congratulated, work on cholesterol 3) Stop rosuvastatin 4) Start simvastatin 5) Follow up appt in 4 months, fasting labs prior 6) Cards new patient - hx of murmur, hx of DMII and HLD

## 2023-03-20 NOTE — Progress Notes (Signed)
Established Patient Office Visit  Subjective:  Patient ID: Jeremy Padilla, male    DOB: [birth year]  Age: 57 y.o. MRN: [id number]  Chief Complaint  Patient presents with   Follow-up    5 month follow up    5 month follow up and lab results.  Patient also needs [facility] for sleeve.  Pt having joint pains with statin per his report.  He wants Derm referral for head to toe check yearly.    No other concerns at this time.   Past Medical History:  Diagnosis Date   Abscess 10/05/15   Diabetes mellitus without complication    Herpes exposure    Hyperlipidemia    Hypertension     Past Surgical History:  Procedure Laterality Date   AMPUTATION Left 06/23/2020   Procedure: AMPUTATION ABOVE KNEE;  Surgeon: Annice Needy, MD;  Location: [facility];  Service: General;  Laterality: Left;   LOWER EXTREMITY ANGIOGRAPHY Left 04/09/2019   Procedure: LOWER EXTREMITY ANGIOGRAPHY;  Surgeon: Annice Needy, MD;  Location: [facility];  Service: Cardiovascular;  Laterality: Left;   LOWER EXTREMITY ANGIOGRAPHY Left 04/19/2020   Procedure: Lower Extremity Angiography;  Surgeon: Annice Needy, MD;  Location: [facility];  Service: Cardiovascular;  Laterality: Left;   LOWER EXTREMITY ANGIOGRAPHY Left 04/20/2020   Procedure: Lower Extremity Angiography;  Surgeon: Annice Needy, MD;  Location: [facility];  Service: Cardiovascular;  Laterality: Left;   LOWER EXTREMITY INTERVENTION N/A 05/30/2019   Procedure: LOWER EXTREMITY INTERVENTION;  Surgeon: Renford Dills, MD;  Location: [facility];  Service: Cardiovascular;  Laterality: N/A;   WRIST SURGERY Left     Social History   Socioeconomic History   Marital status: Widowed    Spouse name: Not on file   Number of children: Not on file   Years of education: Not on file   Highest education level: Not on file  Occupational History   Occupation: applied for disability  Tobacco Use   Smoking status: Never     Passive exposure: Never   Smokeless tobacco: Never  Vaping Use   Vaping Use: Never used  Substance and Sexual Activity   Alcohol use: No   Drug use: No   Sexual activity: Yes  Other Topics Concern   Not on file  Social History Narrative   Wife just passed away 04/30/09   Social Determinants of Health   Financial Resource Strain: Not on file  Food Insecurity: Not on file  Transportation Needs: Not on file  Physical Activity: Not on file  Stress: Not on file  Social Connections: Not on file  Intimate Partner Violence: Not on file    Family History  Problem Relation Age of Onset   Heart disease Father    Hyperlipidemia Father    Hypertension Father     Allergies  Allergen Reactions   Penicillins Rash    .Has patient had a PCN reaction causing immediate rash, facial/tongue/throat swelling, SOB or lightheadedness with hypotension: Unknown Has patient had a PCN reaction causing severe rash involving mucus membranes or skin necrosis: Unknown Has patient had a PCN reaction that required hospitalization: Unknown Has patient had a PCN reaction occurring within the last 10 years: Unknown If all of the above answers are "NO", then may proceed with Cephalosporin use.  Patient Tolerates Cephalosporins     Review of Systems  Constitutional: Negative.   HENT: Negative.    Eyes:  Positive for blurred  vision.  Respiratory: Negative.    Cardiovascular: Negative.   Gastrointestinal: Negative.   Genitourinary: Negative.   Musculoskeletal:  Positive for joint pain and myalgias.  Skin: Negative.   Neurological: Negative.   Endo/Heme/Allergies: Negative.   Psychiatric/Behavioral:  The patient is nervous/anxious.        Objective:   BP (!) 140/70   Pulse 79   Ht 6\' 2"  (1.88 m)   Wt 161 lb 3.2 oz (73.1 kg)   SpO2 98%   BMI 20.70 kg/m   Vitals:   03/20/23 1033  BP: (!) 140/70  Pulse: 79  Height: 6\' 2"  (1.88 m)  Weight: 161 lb 3.2 oz (73.1 kg)  SpO2: 98%  BMI  (Calculated): 20.69    Physical Exam Vitals reviewed.  Constitutional:      Appearance: Normal appearance.  HENT:     Head: Normocephalic.     Nose: Nose normal.     Mouth/Throat:     Mouth: Mucous membranes are moist.  Eyes:     Pupils: Pupils are equal, round, and reactive to light.  Cardiovascular:     Rate and Rhythm: Normal rate and regular rhythm.  Pulmonary:     Effort: Pulmonary effort is normal.     Breath sounds: Normal breath sounds.  Abdominal:     General: Bowel sounds are normal.     Palpations: Abdomen is soft.  Musculoskeletal:        General: Normal range of motion.     Cervical back: Normal range of motion and neck supple.     Comments: Right AKA  Skin:    General: Skin is warm and dry.  Neurological:     Mental Status: He is alert and oriented to person, place, and time.  Psychiatric:        Mood and Affect: Mood normal.        Behavior: Behavior normal.      No results found for any visits on 03/20/23.     Assessment & Plan:   Problem List Items Addressed This Visit   None   No follow-ups on file.   Total time spent: 35 minutes  Orson Eva, NP  03/20/2023

## 2023-03-30 ENCOUNTER — Encounter: Payer: Self-pay | Admitting: Cardiovascular Disease

## 2023-03-30 ENCOUNTER — Ambulatory Visit (INDEPENDENT_AMBULATORY_CARE_PROVIDER_SITE_OTHER): Payer: Medicare Other | Admitting: Cardiovascular Disease

## 2023-03-30 VITALS — BP 118/80 | HR 84 | Ht 74.0 in | Wt 156.8 lb

## 2023-03-30 DIAGNOSIS — R011 Cardiac murmur, unspecified: Secondary | ICD-10-CM | POA: Diagnosis not present

## 2023-03-30 DIAGNOSIS — E782 Mixed hyperlipidemia: Secondary | ICD-10-CM | POA: Diagnosis not present

## 2023-03-30 DIAGNOSIS — I10 Essential (primary) hypertension: Secondary | ICD-10-CM | POA: Diagnosis not present

## 2023-03-30 DIAGNOSIS — R9431 Abnormal electrocardiogram [ECG] [EKG]: Secondary | ICD-10-CM

## 2023-03-30 DIAGNOSIS — I493 Ventricular premature depolarization: Secondary | ICD-10-CM

## 2023-03-30 NOTE — Progress Notes (Signed)
Cardiology Office Note   Date:  03/30/2023   ID:  Jeremy Padilla, DOB 05/30/1966, MRN [id number]  PCP:  Orson Eva, NP  Cardiologist:  Adrian Blackwater, MD      History of Present Illness: Jeremy Padilla is a 57 y.o. male who presents for  Chief Complaint  Patient presents with   Establish Care    Cardiac consult, history of murmur, DMII and HLD.    Abnormal EKG, with bigeminy and heart murmer. Has risk factors for CAD      Past Medical History:  Diagnosis Date   Abscess 10/05/15   Diabetes mellitus without complication    Herpes exposure    Hyperlipidemia    Hypertension      Past Surgical History:  Procedure Laterality Date   AMPUTATION Left 06/23/2020   Procedure: AMPUTATION ABOVE KNEE;  Surgeon: Annice Needy, MD;  Location: [facility];  Service: General;  Laterality: Left;   LOWER EXTREMITY ANGIOGRAPHY Left 04/09/2019   Procedure: LOWER EXTREMITY ANGIOGRAPHY;  Surgeon: Annice Needy, MD;  Location: [facility];  Service: Cardiovascular;  Laterality: Left;   LOWER EXTREMITY ANGIOGRAPHY Left 04/19/2020   Procedure: Lower Extremity Angiography;  Surgeon: Annice Needy, MD;  Location: [facility];  Service: Cardiovascular;  Laterality: Left;   LOWER EXTREMITY ANGIOGRAPHY Left 04/20/2020   Procedure: Lower Extremity Angiography;  Surgeon: Annice Needy, MD;  Location: [facility];  Service: Cardiovascular;  Laterality: Left;   LOWER EXTREMITY INTERVENTION N/A 05/30/2019   Procedure: LOWER EXTREMITY INTERVENTION;  Surgeon: Renford Dills, MD;  Location: [facility];  Service: Cardiovascular;  Laterality: N/A;   WRIST SURGERY Left      Current Outpatient Medications  Medication Sig Dispense Refill   amLODipine (NORVASC) 2.5 MG tablet Take 2.5 mg by mouth daily.     ascorbic acid (VITAMIN C) 250 MG tablet Take 1 tablet (250 mg total) by mouth 2 (two) times daily. 60 tablet 0   aspirin EC 81 MG tablet Take 81 mg by mouth  daily.     glipiZIDE (GLUCOTROL XL) 5 MG 24 hr tablet Take 5 mg by mouth daily.     hydrochlorothiazide (HYDRODIURIL) 25 MG tablet Take 25 mg by mouth daily.     lisinopril (ZESTRIL) 40 MG tablet Take 40 mg by mouth daily.     multivitamin-lutein (OCUVITE-LUTEIN) CAPS capsule Take 1 capsule by mouth daily. 30 capsule 0   simvastatin (ZOCOR) 20 MG tablet Take 1 tablet (20 mg total) by mouth daily. 30 tablet 0   AMBULATORY NON FORMULARY MEDICATION Trimix (30/1/10)-(Pap/Phent/PGE)  Test Dose  1ml vial   Qty #3 Refills 0  [facility] [phone] Fax [phone] (Patient not taking: Reported on 03/20/2023) 3 vial 0   ferrous sulfate 325 (65 FE) MG tablet Take 1 tablet (325 mg total) by mouth 2 (two) times daily with a meal. (Patient not taking: Reported on 03/20/2023) 60 tablet 3   tadalafil (CIALIS) 20 MG tablet Take 1 tablet (20 mg total) by mouth every other day. (Patient not taking: Reported on 01/24/2023) 30 tablet 6   Testosterone 1.62 % GEL SMARTSIG:40.5 Milligram(s) Topical Every Morning (Patient not taking: Reported on 01/24/2023)     No current facility-administered medications for this visit.    Allergies:   Penicillins    Social History:   reports that he has never smoked. He has never been exposed to tobacco smoke. He has never used smokeless tobacco.  He reports that he does not drink alcohol and does not use drugs.   Family History:  family history includes Heart disease in his father; Hyperlipidemia in his father; Hypertension in his father.    ROS:     Review of Systems  Constitutional: Negative.   HENT: Negative.    Eyes: Negative.   Respiratory: Negative.    Gastrointestinal: Negative.   Genitourinary: Negative.   Musculoskeletal: Negative.   Skin: Negative.   Neurological: Negative.   Endo/Heme/Allergies: Negative.   Psychiatric/Behavioral: Negative.    All other systems reviewed and are negative.     All other systems are reviewed and negative.     PHYSICAL EXAM: VS:  BP 118/80   Pulse 84   Ht  (1.88 m)   Wt 156 lb 12.8 oz (71.1 kg)   SpO2 98%   BMI 20.13 kg/m  , BMI Body mass index is 20.13 kg/m. Last weight:  Wt Readings from Last 3 Encounters:  03/30/23 156 lb 12.8 oz (71.1 kg)  03/20/23 161 lb 3.2 oz (73.1 kg)  10/18/22 157 lb (71.2 kg)     Physical Exam Vitals reviewed.  Constitutional:      Appearance: Normal appearance. He is normal weight.  HENT:     Head: Normocephalic.     Nose: Nose normal.     Mouth/Throat:     Mouth: Mucous membranes are moist.  Eyes:     Pupils: Pupils are equal, round, and reactive to light.  Cardiovascular:     Rate and Rhythm: Normal rate and regular rhythm.     Pulses: Normal pulses.     Heart sounds: Normal heart sounds.  Pulmonary:     Effort: Pulmonary effort is normal.  Abdominal:     General: Abdomen is flat. Bowel sounds are normal.  Musculoskeletal:        General: Normal range of motion.     Cervical back: Normal range of motion.  Skin:    General: Skin is warm.  Neurological:     General: No focal deficit present.     Mental Status: He is alert.  Psychiatric:        Mood and Affect: Mood normal.       EKG: NSR 89/min frquent PVC, and bigeminy and LVH  Recent Labs: 03/13/2023: ALT 14; BUN 34; Creatinine, Ser 1.14; Potassium 4.6; Sodium 139; TSH 1.350    Lipid Panel    Component Value Date/Time   CHOL 174 03/13/2023 0853   TRIG 98 03/13/2023 0853   HDL 52 03/13/2023 0853   LDLCALC 104 (H) 03/13/2023 0853      Other studies Reviewed: Additional studies/ records that were reviewed today include:  Review of the above records demonstrates:      08/03/2017    9:39 AM  PAD Screen  Previous PAD dx? No  Previous surgical procedure? No  Pain with walking? No  Feet/toe relief with dangling? No  Painful, non-healing ulcers? No  Extremities discolored? No      ASSESSMENT AND PLAN:    ICD-10-CM   1. Hypertension goal BP (blood pressure) <  140/90  I10 MYOCARDIAL PERFUSION IMAGING    PCV ECHOCARDIOGRAM COMPLETE    2. Mixed hyperlipidemia  E78.2 MYOCARDIAL PERFUSION IMAGING    PCV ECHOCARDIOGRAM COMPLETE    3. Heart murmur  R01.1 MYOCARDIAL PERFUSION IMAGING    PCV ECHOCARDIOGRAM COMPLETE   advise echo    4. Abnormal EKG  R94.31 MYOCARDIAL PERFUSION IMAGING    PCV  ECHOCARDIOGRAM COMPLETE   Advise stress test    5. PVC (premature ventricular contraction)  I49.3 MYOCARDIAL PERFUSION IMAGING    PCV ECHOCARDIOGRAM COMPLETE       Problem List Items Addressed This Visit       Cardiovascular and Mediastinum   Hypertension goal BP (blood pressure) < 140/90 - Primary   Relevant Orders   MYOCARDIAL PERFUSION IMAGING   PCV ECHOCARDIOGRAM COMPLETE     Other   Heart murmur   Relevant Orders   MYOCARDIAL PERFUSION IMAGING   PCV ECHOCARDIOGRAM COMPLETE   Mixed hyperlipidemia   Relevant Orders   MYOCARDIAL PERFUSION IMAGING   PCV ECHOCARDIOGRAM COMPLETE   Other Visit Diagnoses     Abnormal EKG       Advise stress test   Relevant Orders   MYOCARDIAL PERFUSION IMAGING   PCV ECHOCARDIOGRAM COMPLETE   PVC (premature ventricular contraction)       Relevant Orders   MYOCARDIAL PERFUSION IMAGING   PCV ECHOCARDIOGRAM COMPLETE          Disposition:   No follow-ups on file.    Total time spent: 40 minutes  Signed,  Adrian Blackwater, MD  03/30/2023 10:22 AM    [facility]

## 2023-04-02 ENCOUNTER — Encounter: Payer: Self-pay | Admitting: Cardiovascular Disease

## 2023-04-20 ENCOUNTER — Other Ambulatory Visit: Payer: Medicare Other

## 2023-04-24 ENCOUNTER — Ambulatory Visit: Payer: Medicare Other | Admitting: Cardiovascular Disease

## 2023-05-08 ENCOUNTER — Telehealth: Payer: Self-pay

## 2023-05-08 NOTE — Telephone Encounter (Signed)
Patient called requesting referral to [facility] for legs / pt please advisse

## 2023-05-09 ENCOUNTER — Other Ambulatory Visit: Payer: Self-pay | Admitting: Nurse Practitioner

## 2023-05-11 ENCOUNTER — Other Ambulatory Visit: Payer: Self-pay | Admitting: Internal Medicine

## 2023-05-11 DIAGNOSIS — E0842 Diabetes mellitus due to underlying condition with diabetic polyneuropathy: Secondary | ICD-10-CM

## 2023-05-11 DIAGNOSIS — L089 Local infection of the skin and subcutaneous tissue, unspecified: Secondary | ICD-10-CM

## 2023-05-11 MED ORDER — DOXYCYCLINE HYCLATE 100 MG PO TABS
100.0000 mg | ORAL_TABLET | Freq: Two times a day (BID) | ORAL | 0 refills | Status: DC
Start: 2023-05-11 — End: 2023-07-30

## 2023-05-11 MED ORDER — PREGABALIN 25 MG PO CAPS
ORAL_CAPSULE | ORAL | 1 refills | Status: DC
Start: 2023-05-11 — End: 2023-07-30

## 2023-05-17 ENCOUNTER — Ambulatory Visit: Payer: Medicare Other | Admitting: Nurse Practitioner

## 2023-05-18 ENCOUNTER — Encounter: Payer: Self-pay | Admitting: Internal Medicine

## 2023-05-18 ENCOUNTER — Ambulatory Visit (INDEPENDENT_AMBULATORY_CARE_PROVIDER_SITE_OTHER): Payer: Medicare Other | Admitting: Internal Medicine

## 2023-05-18 VITALS — BP 136/64 | HR 44 | Ht 74.0 in | Wt 158.0 lb

## 2023-05-18 DIAGNOSIS — E0842 Diabetes mellitus due to underlying condition with diabetic polyneuropathy: Secondary | ICD-10-CM

## 2023-05-18 DIAGNOSIS — E1139 Type 2 diabetes mellitus with other diabetic ophthalmic complication: Secondary | ICD-10-CM

## 2023-05-18 DIAGNOSIS — Z89612 Acquired absence of left leg above knee: Secondary | ICD-10-CM

## 2023-05-18 DIAGNOSIS — E1165 Type 2 diabetes mellitus with hyperglycemia: Secondary | ICD-10-CM

## 2023-05-18 DIAGNOSIS — E782 Mixed hyperlipidemia: Secondary | ICD-10-CM

## 2023-05-18 NOTE — Progress Notes (Signed)
Established Patient Office Visit  Subjective:  Patient ID: Jeremy Padilla, male    DOB: 1966/02/29  Age: 57 y.o. MRN: [id number]  Chief Complaint  Patient presents with   Follow-up    Discuss PT referral    Patient is here only to request a referral to be sent to [facility] health physical therapy and rehab department.  He needs PT for his left lower extremity status post AKA. He does not have any other complaints.  Patient reports that he is tolerating simvastatin better than the rosuvastatin. He will return for his regular follow-up as scheduled. Referral sent.    No other concerns at this time.   Past Medical History:  Diagnosis Date   Abscess 10/05/15   Diabetes mellitus without complication (HCC)    Herpes exposure    Hyperlipidemia    Hypertension     Past Surgical History:  Procedure Laterality Date   AMPUTATION Left 06/23/2020   Procedure: AMPUTATION ABOVE KNEE;  Surgeon: Annice Needy, MD;  Location: [facility];  Service: General;  Laterality: Left;   LOWER EXTREMITY ANGIOGRAPHY Left 04/09/2019   Procedure: LOWER EXTREMITY ANGIOGRAPHY;  Surgeon: Annice Needy, MD;  Location: [facility];  Service: Cardiovascular;  Laterality: Left;   LOWER EXTREMITY ANGIOGRAPHY Left 04/19/2020   Procedure: Lower Extremity Angiography;  Surgeon: Annice Needy, MD;  Location: [facility];  Service: Cardiovascular;  Laterality: Left;   LOWER EXTREMITY ANGIOGRAPHY Left 04/20/2020   Procedure: Lower Extremity Angiography;  Surgeon: Annice Needy, MD;  Location: [facility];  Service: Cardiovascular;  Laterality: Left;   LOWER EXTREMITY INTERVENTION N/A 05/30/2019   Procedure: LOWER EXTREMITY INTERVENTION;  Surgeon: Renford Dills, MD;  Location: [facility];  Service: Cardiovascular;  Laterality: N/A;   WRIST SURGERY Left     Social History   Socioeconomic History   Marital status: Widowed    Spouse name: Not on file   Number of children: Not on file    Years of education: Not on file   Highest education level: Not on file  Occupational History   Occupation: applied for disability  Tobacco Use   Smoking status: Never    Passive exposure: Never   Smokeless tobacco: Never  Vaping Use   Vaping Use: Never used  Substance and Sexual Activity   Alcohol use: No   Drug use: No   Sexual activity: Yes  Other Topics Concern   Not on file  Social History Narrative   Wife just passed away April 08, 2009   Social Determinants of Health   Financial Resource Strain: Not on file  Food Insecurity: Not on file  Transportation Needs: Not on file  Physical Activity: Not on file  Stress: Not on file  Social Connections: Not on file  Intimate Partner Violence: Not on file    Family History  Problem Relation Age of Onset   Heart disease Father    Hyperlipidemia Father    Hypertension Father     Allergies  Allergen Reactions   Penicillins Rash    .Has patient had a PCN reaction causing immediate rash, facial/tongue/throat swelling, SOB or lightheadedness with hypotension: Unknown Has patient had a PCN reaction causing severe rash involving mucus membranes or skin necrosis: Unknown Has patient had a PCN reaction that required hospitalization: Unknown Has patient had a PCN reaction occurring within the last 10 years: Unknown If all of the above answers are "NO", then may proceed with Cephalosporin use.  Patient Tolerates Cephalosporins     Review of Systems  Constitutional: Negative.   HENT: Negative.    Respiratory:  Negative for cough, shortness of breath and wheezing.   Cardiovascular:  Negative for chest pain, palpitations, orthopnea, leg swelling and PND.  Gastrointestinal: Negative.   Genitourinary: Negative.   Musculoskeletal: Negative.   Neurological: Negative.   Psychiatric/Behavioral:  Negative for depression.        Objective:   BP 136/64   Pulse (!) 44   Ht 6\' 2"  (1.88 m)   Wt 158 lb (71.7 kg)   SpO2 96%   BMI  20.29 kg/m   Vitals:   05/18/23 0856  BP: 136/64  Pulse: (!) 44  Height: 6\' 2"  (1.88 m)  Weight: 158 lb (71.7 kg)  SpO2: 96%  BMI (Calculated): 20.28    Physical Exam Cardiovascular:     Rate and Rhythm: Normal rate.     Pulses: Normal pulses.     Heart sounds: Murmur heard.  Pulmonary:     Effort: Pulmonary effort is normal.     Breath sounds: Rhonchi present. No wheezing or rales.      No results found for any visits on 05/18/23.  Recent Results (from the past 2160 hour(s))  Comprehensive metabolic panel     Status: Abnormal   Collection Time: 03/13/23  8:53 AM  Result Value Ref Range   Glucose 120 (H) 70 - 99 mg/dL   BUN 34 (H) 6 - 24 mg/dL   Creatinine, Ser 4.09 0.76 - 1.27 mg/dL   eGFR 75 >81 XB/JYN/8.29   BUN/Creatinine Ratio 30 (H) 9 - 20   Sodium 139 134 - 144 mmol/L   Potassium 4.6 3.5 - 5.2 mmol/L   Chloride 101 96 - 106 mmol/L   CO2 24 20 - 29 mmol/L   Calcium 9.7 8.7 - 10.2 mg/dL   Total Protein 6.5 6.0 - 8.5 g/dL   Albumin 4.5 3.8 - 4.9 g/dL   Globulin, Total 2.0 1.5 - 4.5 g/dL   Albumin/Globulin Ratio 2.3 (H) 1.2 - 2.2   Bilirubin Total 0.3 0.0 - 1.2 mg/dL   Alkaline Phosphatase 116 44 - 121 IU/L   AST 16 0 - 40 IU/L   ALT 14 0 - 44 IU/L  Lipid Panel w/o Chol/HDL Ratio     Status: Abnormal   Collection Time: 03/13/23  8:53 AM  Result Value Ref Range   Cholesterol, Total 174 100 - 199 mg/dL   Triglycerides 98 0 - 149 mg/dL   HDL 52 >56 mg/dL   VLDL Cholesterol Cal 18 5 - 40 mg/dL   LDL Chol Calc (NIH) 213 (H) 0 - 99 mg/dL  Hgb Y8M w/o eAG     Status: None   Collection Time: 03/13/23  8:53 AM  Result Value Ref Range   Hgb A1c MFr Bld 5.5 4.8 - 5.6 %    Comment:          Prediabetes: 5.7 - 6.4          Diabetes: >6.4          Glycemic control for adults with diabetes: <7.0   TSH     Status: None   Collection Time: 03/13/23  8:53 AM  Result Value Ref Range   TSH 1.350 0.450 - 4.500 uIU/mL      Assessment & Plan:   Problem List Items  Addressed This Visit     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   Total time spent: 15 minutes  Margaretann Loveless, MD  05/18/2023   This document may have been prepared by [facility] Voice Recognition software and as such may include unintentional dictation errors.

## 2023-05-25 ENCOUNTER — Telehealth: Payer: Self-pay | Admitting: Nurse Practitioner

## 2023-05-25 NOTE — Telephone Encounter (Signed)
Patient called and said that he received the referral information for PT from the worker at [facility]. They gave him the information and said that is where they send their patients with prosthetics for PT.

## 2023-06-01 ENCOUNTER — Other Ambulatory Visit: Payer: Self-pay | Admitting: Nurse Practitioner

## 2023-06-01 DIAGNOSIS — E782 Mixed hyperlipidemia: Secondary | ICD-10-CM

## 2023-06-06 ENCOUNTER — Other Ambulatory Visit: Payer: Self-pay | Admitting: Nurse Practitioner

## 2023-07-09 ENCOUNTER — Other Ambulatory Visit: Payer: Self-pay | Admitting: Family

## 2023-07-09 DIAGNOSIS — E782 Mixed hyperlipidemia: Secondary | ICD-10-CM

## 2023-07-17 ENCOUNTER — Other Ambulatory Visit: Payer: Medicare Other

## 2023-07-17 DIAGNOSIS — E782 Mixed hyperlipidemia: Secondary | ICD-10-CM

## 2023-07-17 DIAGNOSIS — E0842 Diabetes mellitus due to underlying condition with diabetic polyneuropathy: Secondary | ICD-10-CM

## 2023-07-17 DIAGNOSIS — I10 Essential (primary) hypertension: Secondary | ICD-10-CM

## 2023-07-19 NOTE — Progress Notes (Signed)
Patient notified

## 2023-07-20 ENCOUNTER — Ambulatory Visit: Payer: Medicare Other | Admitting: Nurse Practitioner

## 2023-07-23 ENCOUNTER — Telehealth: Payer: Self-pay

## 2023-07-23 NOTE — Telephone Encounter (Signed)
Patient Jeremy Padilla asking for call back about a medication

## 2023-07-30 ENCOUNTER — Encounter: Payer: Self-pay | Admitting: Cardiology

## 2023-07-30 ENCOUNTER — Ambulatory Visit (INDEPENDENT_AMBULATORY_CARE_PROVIDER_SITE_OTHER): Payer: Medicare Other | Admitting: Cardiology

## 2023-07-30 VITALS — BP 168/74 | HR 44 | Ht 74.0 in | Wt 147.0 lb

## 2023-07-30 DIAGNOSIS — I10 Essential (primary) hypertension: Secondary | ICD-10-CM

## 2023-07-30 DIAGNOSIS — Z89612 Acquired absence of left leg above knee: Secondary | ICD-10-CM

## 2023-07-30 DIAGNOSIS — E782 Mixed hyperlipidemia: Secondary | ICD-10-CM | POA: Diagnosis not present

## 2023-07-30 MED ORDER — NEXLIZET 180-10 MG PO TABS: 1.0000 | ORAL_TABLET | Freq: Every day | ORAL | Status: DC

## 2023-07-30 MED ORDER — GLIPIZIDE ER 5 MG PO TB24: 5.0000 mg | ORAL_TABLET | Freq: Every day | ORAL | 4 refills | Status: DC

## 2023-07-30 NOTE — Progress Notes (Signed)
Established Patient Office Visit  Subjective:  Patient ID: Jeremy Padilla, male    DOB: Nov 17, 1966  Age: 57 y.o. MRN: [id number]  Chief Complaint  Patient presents with   Follow-up    Follow up    Patient in office for regular follow up. Patient doing well. No new complaints today. Up to date on colon cancer screening. Cologard 02/2022 Hgb A1c elevated, restart Glipizide.  Did not tolerate simvastatin, GI upset. Rosuvastatin caused myalgias. Will try Nexlizet.  Patient needing new referral for PT.     No other concerns at this time.   Past Medical History:  Diagnosis Date   Abscess 10/05/15   Diabetes mellitus without complication (HCC)    Herpes exposure    Hyperlipidemia    Hypertension     Past Surgical History:  Procedure Laterality Date   AMPUTATION Left 06/23/2020   Procedure: AMPUTATION ABOVE KNEE;  Surgeon: Annice Needy, MD;  Location: [facility];  Service: General;  Laterality: Left;   LOWER EXTREMITY ANGIOGRAPHY Left 04/09/2019   Procedure: LOWER EXTREMITY ANGIOGRAPHY;  Surgeon: Annice Needy, MD;  Location: [facility];  Service: Cardiovascular;  Laterality: Left;   LOWER EXTREMITY ANGIOGRAPHY Left 04/19/2020   Procedure: Lower Extremity Angiography;  Surgeon: Annice Needy, MD;  Location: [facility];  Service: Cardiovascular;  Laterality: Left;   LOWER EXTREMITY ANGIOGRAPHY Left 04/20/2020   Procedure: Lower Extremity Angiography;  Surgeon: Annice Needy, MD;  Location: [facility];  Service: Cardiovascular;  Laterality: Left;   LOWER EXTREMITY INTERVENTION N/A 05/30/2019   Procedure: LOWER EXTREMITY INTERVENTION;  Surgeon: Renford Dills, MD;  Location: [facility];  Service: Cardiovascular;  Laterality: N/A;   WRIST SURGERY Left     Social History   Socioeconomic History   Marital status: Widowed    Spouse name: Not on file   Number of children: Not on file   Years of education: Not on file   Highest education  level: Not on file  Occupational History   Occupation: applied for disability  Tobacco Use   Smoking status: Never    Passive exposure: Never   Smokeless tobacco: Never  Vaping Use   Vaping status: Never Used  Substance and Sexual Activity   Alcohol use: No   Drug use: No   Sexual activity: Yes  Other Topics Concern   Not on file  Social History Narrative   Wife just passed away 04-30-09   Social Determinants of Health   Financial Resource Strain: Low Risk  (06/01/2020)   Received from [facility], [facility]   Overall Financial Resource Strain (CARDIA)    Difficulty of Paying Living Expenses: Not hard at all  Food Insecurity: No Food Insecurity (06/01/2020)   Received from [facility], [facility]   Hunger Vital Sign    Worried About Running Out of Food in the Last Year: Never true    Ran Out of Food in the Last Year: Never true  Transportation Needs: No Transportation Needs (06/01/2020)   Received from [facility], [facility]   [facility] - Transportation    Lack of Transportation (Medical): No    Lack of Transportation (Non-Medical): No  Physical Activity: Not on file  Stress: Not on file  Social Connections: Not on file  Intimate Partner Violence: Not on file    Family History  Problem Relation Age of Onset   Heart disease Father  Hyperlipidemia Father    Hypertension Father     Allergies  Allergen Reactions   Penicillins Rash    .Has patient had a PCN reaction causing immediate rash, facial/tongue/throat swelling, SOB or lightheadedness with hypotension: Unknown Has patient had a PCN reaction causing severe rash involving mucus membranes or skin necrosis: Unknown Has patient had a PCN reaction that required hospitalization: Unknown Has patient had a PCN reaction occurring within the last 10 years: Unknown If all of the above answers are "NO", then may proceed with Cephalosporin use.  Patient Tolerates Cephalosporins    Statins  Other (See Comments)    Review of Systems  Constitutional: Negative.   HENT: Negative.    Eyes: Negative.   Respiratory: Negative.  Negative for shortness of breath.   Cardiovascular: Negative.  Negative for chest pain.  Gastrointestinal: Negative.  Negative for abdominal pain, constipation and diarrhea.  Genitourinary: Negative.   Musculoskeletal:  Negative for joint pain and myalgias.  Skin: Negative.   Neurological: Negative.  Negative for dizziness and headaches.  Endo/Heme/Allergies: Negative.   All other systems reviewed and are negative.      Objective:   BP (!) 168/74   Pulse (!) 44   Ht 6\' 2"  (1.88 m)   Wt 147 lb (66.7 kg)   SpO2 95%   BMI 18.87 kg/m   Vitals:   07/30/23 1303  BP: (!) 168/74  Pulse: (!) 44  Height: 6\' 2"  (1.88 m)  Weight: 147 lb (66.7 kg)  SpO2: 95%  BMI (Calculated): 18.87    Physical Exam Nursing note reviewed.  Constitutional:      Appearance: Normal appearance. He is normal weight.  HENT:     Head: Normocephalic and atraumatic.     Nose: Nose normal.     Mouth/Throat:     Mouth: Mucous membranes are moist.     Pharynx: Oropharynx is clear.  Eyes:     Extraocular Movements: Extraocular movements intact.     Conjunctiva/sclera: Conjunctivae normal.     Pupils: Pupils are equal, round, and reactive to light.  Cardiovascular:     Rate and Rhythm: Normal rate and regular rhythm.     Pulses: Normal pulses.     Heart sounds: Normal heart sounds.  Pulmonary:     Effort: Pulmonary effort is normal.     Breath sounds: Normal breath sounds.  Abdominal:     General: Abdomen is flat. Bowel sounds are normal.     Palpations: Abdomen is soft.  Musculoskeletal:        General: Normal range of motion.     Cervical back: Normal range of motion.  Skin:    General: Skin is warm and dry.  Neurological:     General: No focal deficit present.     Mental Status: He is alert and oriented to person, place, and time.  Psychiatric:         Mood and Affect: Mood normal.        Behavior: Behavior normal.        Thought Content: Thought content normal.        Judgment: Judgment normal.      No results found for any visits on 07/30/23.  Recent Results (from the past 2160 hour(s))  Hemoglobin A1c     Status: Abnormal   Collection Time: 07/17/23 11:10 AM  Result Value Ref Range   Hgb A1c MFr Bld 6.4 (H) 4.8 - 5.6 %    Comment:  Prediabetes: 5.7 - 6.4          Diabetes: >6.4          Glycemic control for adults with diabetes: <7.0    Est. average glucose Bld gHb Est-mCnc 137 mg/dL  TSH     Status: None   Collection Time: 07/17/23 11:10 AM  Result Value Ref Range   TSH 0.644 0.450 - 4.500 uIU/mL  CMP14+EGFR     Status: Abnormal   Collection Time: 07/17/23 11:10 AM  Result Value Ref Range   Glucose 207 (H) 70 - 99 mg/dL   BUN 32 (H) 6 - 24 mg/dL   Creatinine, Ser 9.56 0.76 - 1.27 mg/dL   eGFR 81 >21 HY/QMV/7.84   BUN/Creatinine Ratio 30 (H) 9 - 20   Sodium 140 134 - 144 mmol/L   Potassium 3.8 3.5 - 5.2 mmol/L   Chloride 99 96 - 106 mmol/L   CO2 25 20 - 29 mmol/L   Calcium 9.6 8.7 - 10.2 mg/dL   Total Protein 6.6 6.0 - 8.5 g/dL   Albumin 4.3 3.8 - 4.9 g/dL   Globulin, Total 2.3 1.5 - 4.5 g/dL   Bilirubin Total 0.3 0.0 - 1.2 mg/dL   Alkaline Phosphatase 125 (H) 44 - 121 IU/L   AST 21 0 - 40 IU/L   ALT 19 0 - 44 IU/L  Lipid panel     Status: Abnormal   Collection Time: 07/17/23 11:10 AM  Result Value Ref Range   Cholesterol, Total 177 100 - 199 mg/dL   Triglycerides 68 0 - 149 mg/dL   HDL 55 >69 mg/dL   VLDL Cholesterol Cal 13 5 - 40 mg/dL   LDL Chol Calc (NIH) 629 (H) 0 - 99 mg/dL   Chol/HDL Ratio 3.2 0.0 - 5.0 ratio    Comment:                                   T. Chol/HDL Ratio                                             Men  Women                               1/2 Avg.Risk  3.4    3.3                                   Avg.Risk  5.0    4.4                                2X Avg.Risk  9.6    7.1                                 3X Avg.Risk 23.4   11.0   CBC with Differential/Platelet     Status: None   Collection Time: 07/17/23 11:10 AM  Result Value Ref Range   WBC 6.3 3.4 - 10.8 x10E3/uL   RBC 5.42 4.14 - 5.80 x10E6/uL   Hemoglobin 15.6 13.0 - 17.7 g/dL  Hematocrit 46.2 37.5 - 51.0 %   MCV 85 79 - 97 fL   MCH 28.8 26.6 - 33.0 pg   MCHC 33.8 31.5 - 35.7 g/dL   RDW 40.9 81.1 - 91.4 %   Platelets 208 150 - 450 x10E3/uL   Neutrophils 68 Not Estab. %   Lymphs 23 Not Estab. %   Monocytes 4 Not Estab. %   Eos 5 Not Estab. %   Basos 0 Not Estab. %   Neutrophils Absolute 4.2 1.4 - 7.0 x10E3/uL   Lymphocytes Absolute 1.5 0.7 - 3.1 x10E3/uL   Monocytes Absolute 0.3 0.1 - 0.9 x10E3/uL   EOS (ABSOLUTE) 0.3 0.0 - 0.4 x10E3/uL   Basophils Absolute 0.0 0.0 - 0.2 x10E3/uL   Immature Granulocytes 0 Not Estab. %   Immature Grans (Abs) 0.0 0.0 - 0.1 x10E3/uL      Assessment & Plan:  Restart Glipizide. Nexlizet for elevated LDL. PT order placed.   Problem List Items Addressed This Visit       Cardiovascular and Mediastinum   Hypertension goal BP (blood pressure) < 140/90 - Primary   Relevant Medications   Bempedoic Acid-Ezetimibe (NEXLIZET) 180-10 MG TABS     Other   Hx of AKA (above knee amputation), left (HCC)   Relevant Orders   Ambulatory referral to Physical Therapy   Mixed hyperlipidemia   Relevant Medications   Bempedoic Acid-Ezetimibe (NEXLIZET) 180-10 MG TABS    Return in about 3 months (around 10/30/2023) for with fasting labs prior.   Total time spent: 25 minutes  [employer], NP  07/30/2023   This document may have been prepared by Dragon Voice Recognition software and as such may include unintentional dictation errors.

## 2023-08-02 ENCOUNTER — Telehealth: Payer: Self-pay | Admitting: *Deleted

## 2023-08-02 NOTE — Telephone Encounter (Signed)
Pt walked in asking for a new urologist for IPP. There is a fax stating that [facility] received our fax for the are unable to provide services in a timely manner and are recommending that the patient be seen by a physician in their local area. Charene aware and will see if there are any other providers in the area.  (See letter on your desk)

## 2023-08-03 ENCOUNTER — Other Ambulatory Visit: Payer: Self-pay | Admitting: Urology

## 2023-08-03 DIAGNOSIS — N529 Male erectile dysfunction, unspecified: Secondary | ICD-10-CM

## 2023-08-09 ENCOUNTER — Encounter: Payer: Self-pay | Admitting: Physical Therapy

## 2023-08-09 ENCOUNTER — Other Ambulatory Visit: Payer: Self-pay

## 2023-08-09 ENCOUNTER — Ambulatory Visit: Payer: Medicare Other | Admitting: Physical Therapy

## 2023-08-09 VITALS — BP 80/50 | HR 42

## 2023-08-09 DIAGNOSIS — R269 Unspecified abnormalities of gait and mobility: Secondary | ICD-10-CM

## 2023-08-09 NOTE — Therapy (Signed)
[facility] [address] [place], [place], [address] Phone: [phone]   Fax:  [phone]  Patient Details  Name: Jeremy Padilla MRN: [id number] Date of Birth: 05/30/1966 Referring Provider:  Marisue Ivan, NP  Encounter Date: 08/09/2023  Session was arrive no charge as patient BP very low with both automatic/manual BP readings and HR irregular when assessed manually. Patient reports feeling asymptomatic but given swing from very elevated BP at last visit and extent of low readings, therapist expressed concern. Therapist recommend patient follow up with ED and asked if she could call anyone for the patient. Patient politely refused when offered multiple times and stated would call PCP. Will follow up as able.     08/09/2023    2:27 PM 08/09/2023    2:19 PM 08/09/2023    2:16 PM  Vitals with BMI  Systolic 80 88 91  Diastolic 50 47 45  Pulse -- 42      Carmelia Bake, PT, DPT 08/09/2023, 2:42 PM  [facility] [address] [place], [place], [address] Phone: [phone]   Fax:  [phone]

## 2023-08-10 ENCOUNTER — Ambulatory Visit (INDEPENDENT_AMBULATORY_CARE_PROVIDER_SITE_OTHER): Payer: Medicare Other | Admitting: Cardiovascular Disease

## 2023-08-10 ENCOUNTER — Encounter: Payer: Self-pay | Admitting: Cardiovascular Disease

## 2023-08-10 VITALS — BP 127/63 | HR 42 | Ht 74.0 in | Wt 146.2 lb

## 2023-08-10 DIAGNOSIS — E782 Mixed hyperlipidemia: Secondary | ICD-10-CM

## 2023-08-10 DIAGNOSIS — I998 Other disorder of circulatory system: Secondary | ICD-10-CM | POA: Diagnosis not present

## 2023-08-10 DIAGNOSIS — R739 Hyperglycemia, unspecified: Secondary | ICD-10-CM | POA: Diagnosis not present

## 2023-08-10 DIAGNOSIS — I959 Hypotension, unspecified: Secondary | ICD-10-CM

## 2023-08-10 DIAGNOSIS — I739 Peripheral vascular disease, unspecified: Secondary | ICD-10-CM

## 2023-08-10 DIAGNOSIS — I498 Other specified cardiac arrhythmias: Secondary | ICD-10-CM

## 2023-08-10 DIAGNOSIS — R9431 Abnormal electrocardiogram [ECG] [EKG]: Secondary | ICD-10-CM

## 2023-08-10 NOTE — Progress Notes (Signed)
Cardiology Office Note   Date:  08/10/2023   ID:  Grisham, Fairley Sep 12, 1966, MRN [id number]  PCP:  Marisue Ivan, NP  Cardiologist:  Adrian Blackwater, MD      History of Present Illness: Jeremy Padilla is a 57 y.o. male who presents for No chief complaint on file.   HPI    Past Medical History:  Diagnosis Date   Abscess 10/05/15   Diabetes mellitus without complication (HCC)    Herpes exposure    Hyperlipidemia    Hypertension      Past Surgical History:  Procedure Laterality Date   AMPUTATION Left 06/23/2020   Procedure: AMPUTATION ABOVE KNEE;  Surgeon: Annice Needy, MD;  Location: [facility];  Service: General;  Laterality: Left;   LOWER EXTREMITY ANGIOGRAPHY Left 04/09/2019   Procedure: LOWER EXTREMITY ANGIOGRAPHY;  Surgeon: Annice Needy, MD;  Location: [facility];  Service: Cardiovascular;  Laterality: Left;   LOWER EXTREMITY ANGIOGRAPHY Left 04/19/2020   Procedure: Lower Extremity Angiography;  Surgeon: Annice Needy, MD;  Location: [facility];  Service: Cardiovascular;  Laterality: Left;   LOWER EXTREMITY ANGIOGRAPHY Left 04/20/2020   Procedure: Lower Extremity Angiography;  Surgeon: Annice Needy, MD;  Location: [facility];  Service: Cardiovascular;  Laterality: Left;   LOWER EXTREMITY INTERVENTION N/A 05/30/2019   Procedure: LOWER EXTREMITY INTERVENTION;  Surgeon: Renford Dills, MD;  Location: [facility];  Service: Cardiovascular;  Laterality: N/A;   WRIST SURGERY Left      Current Outpatient Medications  Medication Sig Dispense Refill   ascorbic acid (VITAMIN C) 250 MG tablet Take 1 tablet (250 mg total) by mouth 2 (two) times daily. 60 tablet 0   aspirin EC 81 MG tablet Take 81 mg by mouth daily.     Bempedoic Acid-Ezetimibe (NEXLIZET) 180-10 MG TABS Take 1 tablet by mouth daily.     ferrous sulfate 325 (65 FE) MG tablet Take 1 tablet (325 mg total) by mouth 2 (two) times daily with a meal. 60 tablet 3    glipiZIDE (GLUCOTROL XL) 5 MG 24 hr tablet Take 1 tablet (5 mg total) by mouth daily. 30 tablet 4   lisinopril (ZESTRIL) 40 MG tablet Take 40 mg by mouth daily.     No current facility-administered medications for this visit.    Allergies:   Penicillins and Statins    Social History:   reports that he has never smoked. He has never been exposed to tobacco smoke. He has never used smokeless tobacco. He reports that he does not drink alcohol and does not use drugs.   Family History:  family history includes Heart disease in his father; Hyperlipidemia in his father; Hypertension in his father.    ROS:     Review of Systems  Constitutional: Negative.   HENT: Negative.    Eyes: Negative.   Respiratory: Negative.    Gastrointestinal: Negative.   Genitourinary: Negative.   Musculoskeletal: Negative.   Skin: Negative.   Neurological: Negative.   Endo/Heme/Allergies: Negative.   Psychiatric/Behavioral: Negative.    All other systems reviewed and are negative.     All other systems are reviewed and negative.    PHYSICAL EXAM: VS:  BP 127/63   Pulse (!) 42   Ht 6\' 2"  (1.88 m)   Wt 146 lb 3.2 oz (66.3 kg)   SpO2 98%   BMI 18.77 kg/m  , BMI Body mass index is 18.77 kg/m. Last  weight:  Wt Readings from Last 3 Encounters:  08/10/23 146 lb 3.2 oz (66.3 kg)  07/30/23 147 lb (66.7 kg)  05/18/23 158 lb (71.7 kg)     Physical Exam Vitals reviewed.  Constitutional:      Appearance: Normal appearance. He is normal weight.  HENT:     Head: Normocephalic.     Nose: Nose normal.     Mouth/Throat:     Mouth: Mucous membranes are moist.  Eyes:     Pupils: Pupils are equal, round, and reactive to light.  Cardiovascular:     Rate and Rhythm: Normal rate and regular rhythm.     Pulses: Normal pulses.     Heart sounds: Normal heart sounds.  Pulmonary:     Effort: Pulmonary effort is normal.  Abdominal:     General: Abdomen is flat. Bowel sounds are normal.  Musculoskeletal:         General: Normal range of motion.     Cervical back: Normal range of motion.  Skin:    General: Skin is warm.  Neurological:     General: No focal deficit present.     Mental Status: He is alert.  Psychiatric:        Mood and Affect: Mood normal.       EKG: NSR 78/min [id number]  Recent Labs: 07/17/2023: ALT 19; BUN 32; Creatinine, Ser 1.06; Hemoglobin 15.6; Platelets 208; Potassium 3.8; Sodium 140; TSH 0.644    Lipid Panel    Component Value Date/Time   CHOL 177 07/17/2023 1110   TRIG 68 07/17/2023 1110   HDL 55 07/17/2023 1110   CHOLHDL 3.2 07/17/2023 1110   LDLCALC 109 (H) 07/17/2023 1110      Other studies Reviewed: Additional studies/ records that were reviewed today include:  Review of the above records demonstrates:      08/03/2017    9:39 AM  PAD Screen  Previous PAD dx? No  Previous surgical procedure? No  Pain with walking? No  Feet/toe relief with dangling? No  Painful, non-healing ulcers? No  Extremities discolored? No      ASSESSMENT AND PLAN:    ICD-10-CM   1. Ischemia of left lower extremity  I99.8 MYOCARDIAL PERFUSION IMAGING    PCV ECHOCARDIOGRAM COMPLETE    2. PAD (peripheral artery disease) (HCC)  I73.9 MYOCARDIAL PERFUSION IMAGING    PCV ECHOCARDIOGRAM COMPLETE    3. Mixed hyperlipidemia  E78.2 MYOCARDIAL PERFUSION IMAGING    PCV ECHOCARDIOGRAM COMPLETE    4. Hyperglycemia  R73.9 MYOCARDIAL PERFUSION IMAGING    PCV ECHOCARDIOGRAM COMPLETE    5. Abnormal EKG  R94.31 MYOCARDIAL PERFUSION IMAGING    PCV ECHOCARDIOGRAM COMPLETE   Advise echo, stress test    6. Ventricular bigeminy  I49.8 MYOCARDIAL PERFUSION IMAGING    PCV ECHOCARDIOGRAM COMPLETE    7. Hypotension, unspecified hypotension type  I95.9 MYOCARDIAL PERFUSION IMAGING    PCV ECHOCARDIOGRAM COMPLETE   Hold amlodapine/HCTZ       Problem List Items Addressed This Visit       Cardiovascular and Mediastinum   PAD (peripheral artery disease) (HCC)   Relevant  Orders   MYOCARDIAL PERFUSION IMAGING   PCV ECHOCARDIOGRAM COMPLETE   Ischemia of left lower extremity - Primary   Relevant Orders   MYOCARDIAL PERFUSION IMAGING   PCV ECHOCARDIOGRAM COMPLETE     Other   Hyperglycemia   Relevant Orders   MYOCARDIAL PERFUSION IMAGING   PCV ECHOCARDIOGRAM COMPLETE   Mixed hyperlipidemia   Relevant Orders  MYOCARDIAL PERFUSION IMAGING   PCV ECHOCARDIOGRAM COMPLETE   Other Visit Diagnoses     Abnormal EKG       Advise echo, stress test   Relevant Orders   MYOCARDIAL PERFUSION IMAGING   PCV ECHOCARDIOGRAM COMPLETE   Ventricular bigeminy       Relevant Orders   MYOCARDIAL PERFUSION IMAGING   PCV ECHOCARDIOGRAM COMPLETE   Hypotension, unspecified hypotension type       Hold amlodapine/HCTZ   Relevant Orders   MYOCARDIAL PERFUSION IMAGING   PCV ECHOCARDIOGRAM COMPLETE          Disposition:   Return in about 2 years (around 08/09/2025) for echo, stress test and f/u.    Total time spent: 50 minutes  Signed,  Adrian Blackwater, MD  08/10/2023 11:33 AM    [facility]

## 2023-08-20 ENCOUNTER — Telehealth: Payer: Self-pay

## 2023-08-20 ENCOUNTER — Ambulatory Visit (INDEPENDENT_AMBULATORY_CARE_PROVIDER_SITE_OTHER): Payer: Medicare Other | Admitting: Cardiology

## 2023-08-20 ENCOUNTER — Encounter: Payer: Self-pay | Admitting: Cardiology

## 2023-08-20 VITALS — BP 142/80 | HR 76 | Ht 74.0 in | Wt 156.8 lb

## 2023-08-20 DIAGNOSIS — L089 Local infection of the skin and subcutaneous tissue, unspecified: Secondary | ICD-10-CM

## 2023-08-20 DIAGNOSIS — R600 Localized edema: Secondary | ICD-10-CM

## 2023-08-20 DIAGNOSIS — I739 Peripheral vascular disease, unspecified: Secondary | ICD-10-CM

## 2023-08-20 DIAGNOSIS — E1139 Type 2 diabetes mellitus with other diabetic ophthalmic complication: Secondary | ICD-10-CM | POA: Diagnosis not present

## 2023-08-20 DIAGNOSIS — E1165 Type 2 diabetes mellitus with hyperglycemia: Secondary | ICD-10-CM

## 2023-08-20 MED ORDER — MELOXICAM 15 MG PO TABS: 15.0000 mg | ORAL_TABLET | Freq: Every day | ORAL | 0 refills | Status: DC

## 2023-08-20 MED ORDER — DOXYCYCLINE MONOHYDRATE 100 MG PO TABS: 100.0000 mg | ORAL_TABLET | Freq: Two times a day (BID) | ORAL | 0 refills | Status: AC

## 2023-08-20 NOTE — Telephone Encounter (Signed)
Patient Jeremy Padilla with answering service asking about possibly having gout in his foot, should patient come get labs drawn first or did you want to send something in for there patient

## 2023-08-20 NOTE — Progress Notes (Signed)
Established Patient Office Visit  Subjective:  Patient ID: Jeremy Padilla, male    DOB: 1966-02-10  Age: 57 y.o. MRN: [id number]  Chief Complaint  Patient presents with   Toe Pain    Right great toe swollen, red, throbbing pain.    Patient in office complaining of a swollen toe. Patient reports right great toe started swelling 2 days ago. Called on call service over the weekend. Was told to take tylenol and ibuprofen. Swelling has improved. Toe continues to be erythematous, pus around toe nail. Will send in meloxicam, doxycycline. Refer to podiatry for diabetic foot care. Will check a uric acid to check for gout.   Toe Pain  The incident occurred 2 days ago. The incident occurred at home. There was no injury mechanism. The pain is present in the right toes. The quality of the pain is described as aching. The pain has been Constant since onset. He reports no foreign bodies present. The symptoms are aggravated by movement. He has tried acetaminophen and NSAIDs for the symptoms. The treatment provided mild relief.    No other concerns at this time.   Past Medical History:  Diagnosis Date   Abscess 10/05/15   Diabetes mellitus without complication (HCC)    Herpes exposure    Hyperlipidemia    Hypertension     Past Surgical History:  Procedure Laterality Date   AMPUTATION Left 06/23/2020   Procedure: AMPUTATION ABOVE KNEE;  Surgeon: Annice Needy, MD;  Location: [facility];  Service: General;  Laterality: Left;   LOWER EXTREMITY ANGIOGRAPHY Left 04/09/2019   Procedure: LOWER EXTREMITY ANGIOGRAPHY;  Surgeon: Annice Needy, MD;  Location: [facility];  Service: Cardiovascular;  Laterality: Left;   LOWER EXTREMITY ANGIOGRAPHY Left 04/19/2020   Procedure: Lower Extremity Angiography;  Surgeon: Annice Needy, MD;  Location: [facility];  Service: Cardiovascular;  Laterality: Left;   LOWER EXTREMITY ANGIOGRAPHY Left 04/20/2020   Procedure: Lower Extremity Angiography;   Surgeon: Annice Needy, MD;  Location: [facility];  Service: Cardiovascular;  Laterality: Left;   LOWER EXTREMITY INTERVENTION N/A 05/30/2019   Procedure: LOWER EXTREMITY INTERVENTION;  Surgeon: Renford Dills, MD;  Location: [facility];  Service: Cardiovascular;  Laterality: N/A;   WRIST SURGERY Left     Social History   Socioeconomic History   Marital status: Widowed    Spouse name: Not on file   Number of children: Not on file   Years of education: Not on file   Highest education level: Not on file  Occupational History   Occupation: applied for disability  Tobacco Use   Smoking status: Never    Passive exposure: Never   Smokeless tobacco: Never  Vaping Use   Vaping status: Never Used  Substance and Sexual Activity   Alcohol use: No   Drug use: No   Sexual activity: Yes  Other Topics Concern   Not on file  Social History Narrative   Wife just passed away 26-Apr-2009   Social Determinants of Health   Financial Resource Strain: Low Risk  (06/01/2020)   Received from [facility], [facility]   Overall Financial Resource Strain (CARDIA)    Difficulty of Paying Living Expenses: Not hard at all  Food Insecurity: No Food Insecurity (06/01/2020)   Received from [facility], [facility]   Hunger Vital Sign    Worried About Running Out of Food in the Last Year: Never true  Ran Out of Food in the Last Year: Never true  Transportation Needs: No Transportation Needs (06/01/2020)   Received from [facility], [facility]   [facility] - Transportation    Lack of Transportation (Medical): No    Lack of Transportation (Non-Medical): No  Physical Activity: Not on file  Stress: Not on file  Social Connections: Not on file  Intimate Partner Violence: Not on file    Family History  Problem Relation Age of Onset   Heart disease Father    Hyperlipidemia Father    Hypertension Father     Allergies  Allergen Reactions   Penicillins Rash     .Has patient had a PCN reaction causing immediate rash, facial/tongue/throat swelling, SOB or lightheadedness with hypotension: Unknown Has patient had a PCN reaction causing severe rash involving mucus membranes or skin necrosis: Unknown Has patient had a PCN reaction that required hospitalization: Unknown Has patient had a PCN reaction occurring within the last 10 years: Unknown If all of the above answers are "NO", then may proceed with Cephalosporin use.  Patient Tolerates Cephalosporins    Statins Other (See Comments)    Review of Systems  Constitutional: Negative.   HENT: Negative.    Eyes: Negative.   Respiratory: Negative.  Negative for shortness of breath.   Cardiovascular: Negative.  Negative for chest pain.  Gastrointestinal: Negative.  Negative for abdominal pain, constipation and diarrhea.  Genitourinary: Negative.   Musculoskeletal:  Negative for joint pain and myalgias.  Skin: Negative.   Neurological: Negative.  Negative for dizziness and headaches.  Endo/Heme/Allergies: Negative.   All other systems reviewed and are negative.      Objective:   BP (!) 142/80 (BP Location: Right Arm, Patient Position: Sitting, Cuff Size: Small)   Pulse 76   Ht 6\' 2"  (1.88 m)   Wt 156 lb 12.8 oz (71.1 kg)   SpO2 97%   BMI 20.13 kg/m   Vitals:   08/20/23 1311 08/20/23 1339  BP: (!) 186/84 (!) 142/80  Pulse: 76   Height: 6\' 2"  (1.88 m)   Weight: 156 lb 12.8 oz (71.1 kg)   SpO2: 97%   BMI (Calculated): 20.12     Physical Exam Nursing note reviewed.  Constitutional:      Appearance: Normal appearance. He is normal weight.  HENT:     Head: Normocephalic and atraumatic.     Nose: Nose normal.     Mouth/Throat:     Mouth: Mucous membranes are moist.     Pharynx: Oropharynx is clear.  Eyes:     Extraocular Movements: Extraocular movements intact.     Conjunctiva/sclera: Conjunctivae normal.     Pupils: Pupils are equal, round, and reactive to light.   Cardiovascular:     Rate and Rhythm: Normal rate and regular rhythm.     Pulses: Normal pulses.     Heart sounds: Normal heart sounds.  Pulmonary:     Effort: Pulmonary effort is normal.     Breath sounds: Normal breath sounds.  Abdominal:     General: Abdomen is flat. Bowel sounds are normal.     Palpations: Abdomen is soft.  Musculoskeletal:        General: Normal range of motion.     Cervical back: Normal range of motion.       Feet:  Skin:    General: Skin is warm and dry.  Neurological:     General: No focal deficit present.     Mental Status: He  is alert and oriented to person, place, and time.  Psychiatric:        Mood and Affect: Mood normal.        Behavior: Behavior normal.        Thought Content: Thought content normal.        Judgment: Judgment normal.      No results found for any visits on 08/20/23.  Recent Results (from the past 2160 hour(s))  Hemoglobin A1c     Status: Abnormal   Collection Time: 07/17/23 11:10 AM  Result Value Ref Range   Hgb A1c MFr Bld 6.4 (H) 4.8 - 5.6 %    Comment:          Prediabetes: 5.7 - 6.4          Diabetes: >6.4          Glycemic control for adults with diabetes: <7.0    Est. average glucose Bld gHb Est-mCnc 137 mg/dL  TSH     Status: None   Collection Time: 07/17/23 11:10 AM  Result Value Ref Range   TSH 0.644 0.450 - 4.500 uIU/mL  CMP14+EGFR     Status: Abnormal   Collection Time: 07/17/23 11:10 AM  Result Value Ref Range   Glucose 207 (H) 70 - 99 mg/dL   BUN 32 (H) 6 - 24 mg/dL   Creatinine, Ser 6.04 0.76 - 1.27 mg/dL   eGFR 81 >54 UJ/WJX/9.14   BUN/Creatinine Ratio 30 (H) 9 - 20   Sodium 140 134 - 144 mmol/L   Potassium 3.8 3.5 - 5.2 mmol/L   Chloride 99 96 - 106 mmol/L   CO2 25 20 - 29 mmol/L   Calcium 9.6 8.7 - 10.2 mg/dL   Total Protein 6.6 6.0 - 8.5 g/dL   Albumin 4.3 3.8 - 4.9 g/dL   Globulin, Total 2.3 1.5 - 4.5 g/dL   Bilirubin Total 0.3 0.0 - 1.2 mg/dL   Alkaline Phosphatase 125 (H) 44 - 121 IU/L    AST 21 0 - 40 IU/L   ALT 19 0 - 44 IU/L  Lipid panel     Status: Abnormal   Collection Time: 07/17/23 11:10 AM  Result Value Ref Range   Cholesterol, Total 177 100 - 199 mg/dL   Triglycerides 68 0 - 149 mg/dL   HDL 55 >78 mg/dL   VLDL Cholesterol Cal 13 5 - 40 mg/dL   LDL Chol Calc (NIH) 295 (H) 0 - 99 mg/dL   Chol/HDL Ratio 3.2 0.0 - 5.0 ratio    Comment:                                   T. Chol/HDL Ratio                                             Men  Women                               1/2 Avg.Risk  3.4    3.3                                   Avg.Risk  5.0    4.4  2X Avg.Risk  9.6    7.1                                3X Avg.Risk 23.4   11.0   CBC with Differential/Platelet     Status: None   Collection Time: 07/17/23 11:10 AM  Result Value Ref Range   WBC 6.3 3.4 - 10.8 x10E3/uL   RBC 5.42 4.14 - 5.80 x10E6/uL   Hemoglobin 15.6 13.0 - 17.7 g/dL   Hematocrit 16.1 09.6 - 51.0 %   MCV 85 79 - 97 fL   MCH 28.8 26.6 - 33.0 pg   MCHC 33.8 31.5 - 35.7 g/dL   RDW 04.5 40.9 - 81.1 %   Platelets 208 150 - 450 x10E3/uL   Neutrophils 68 Not Estab. %   Lymphs 23 Not Estab. %   Monocytes 4 Not Estab. %   Eos 5 Not Estab. %   Basos 0 Not Estab. %   Neutrophils Absolute 4.2 1.4 - 7.0 x10E3/uL   Lymphocytes Absolute 1.5 0.7 - 3.1 x10E3/uL   Monocytes Absolute 0.3 0.1 - 0.9 x10E3/uL   EOS (ABSOLUTE) 0.3 0.0 - 0.4 x10E3/uL   Basophils Absolute 0.0 0.0 - 0.2 x10E3/uL   Immature Granulocytes 0 Not Estab. %   Immature Grans (Abs) 0.0 0.0 - 0.1 x10E3/uL      Assessment & Plan:  Meloxicam for pain. Doxycycline for infection.  Podiatry referral for diabetic foot care.  Uric acid to check for gout.   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   Total time spent: 25 minutes  [employer], NP  08/20/2023   This document may have been prepared by Dragon Voice Recognition software and as such may include unintentional dictation errors.

## 2023-08-21 ENCOUNTER — Ambulatory Visit (INDEPENDENT_AMBULATORY_CARE_PROVIDER_SITE_OTHER): Payer: Medicare Other

## 2023-08-21 DIAGNOSIS — I998 Other disorder of circulatory system: Secondary | ICD-10-CM

## 2023-08-21 DIAGNOSIS — I498 Other specified cardiac arrhythmias: Secondary | ICD-10-CM | POA: Diagnosis not present

## 2023-08-21 DIAGNOSIS — I739 Peripheral vascular disease, unspecified: Secondary | ICD-10-CM

## 2023-08-21 DIAGNOSIS — R739 Hyperglycemia, unspecified: Secondary | ICD-10-CM

## 2023-08-21 DIAGNOSIS — R9431 Abnormal electrocardiogram [ECG] [EKG]: Secondary | ICD-10-CM

## 2023-08-21 DIAGNOSIS — I959 Hypotension, unspecified: Secondary | ICD-10-CM

## 2023-08-21 DIAGNOSIS — E782 Mixed hyperlipidemia: Secondary | ICD-10-CM

## 2023-08-21 MED ORDER — TECHNETIUM TC 99M SESTAMIBI GENERIC - CARDIOLITE
10.4000 | Freq: Once | INTRAVENOUS | Status: AC | PRN
  Administered 2023-08-21: 10.4 via INTRAVENOUS

## 2023-08-21 MED ORDER — TECHNETIUM TC 99M SESTAMIBI GENERIC - CARDIOLITE
30.3000 | Freq: Once | INTRAVENOUS | Status: AC | PRN
  Administered 2023-08-21: 30.3 via INTRAVENOUS

## 2023-08-23 ENCOUNTER — Other Ambulatory Visit: Payer: Medicare Other

## 2023-08-23 ENCOUNTER — Ambulatory Visit (INDEPENDENT_AMBULATORY_CARE_PROVIDER_SITE_OTHER): Payer: Medicare Other

## 2023-08-23 DIAGNOSIS — E782 Mixed hyperlipidemia: Secondary | ICD-10-CM

## 2023-08-23 DIAGNOSIS — I959 Hypotension, unspecified: Secondary | ICD-10-CM

## 2023-08-23 DIAGNOSIS — R9431 Abnormal electrocardiogram [ECG] [EKG]: Secondary | ICD-10-CM

## 2023-08-23 DIAGNOSIS — I361 Nonrheumatic tricuspid (valve) insufficiency: Secondary | ICD-10-CM | POA: Diagnosis not present

## 2023-08-23 DIAGNOSIS — I498 Other specified cardiac arrhythmias: Secondary | ICD-10-CM

## 2023-08-23 DIAGNOSIS — I998 Other disorder of circulatory system: Secondary | ICD-10-CM

## 2023-08-23 DIAGNOSIS — I340 Nonrheumatic mitral (valve) insufficiency: Secondary | ICD-10-CM | POA: Diagnosis not present

## 2023-08-23 DIAGNOSIS — I739 Peripheral vascular disease, unspecified: Secondary | ICD-10-CM

## 2023-08-23 DIAGNOSIS — I351 Nonrheumatic aortic (valve) insufficiency: Secondary | ICD-10-CM | POA: Diagnosis not present

## 2023-08-23 DIAGNOSIS — R739 Hyperglycemia, unspecified: Secondary | ICD-10-CM

## 2023-08-27 ENCOUNTER — Encounter: Payer: Self-pay | Admitting: Cardiovascular Disease

## 2023-08-27 ENCOUNTER — Ambulatory Visit (INDEPENDENT_AMBULATORY_CARE_PROVIDER_SITE_OTHER): Payer: Medicare Other | Admitting: Cardiovascular Disease

## 2023-08-27 ENCOUNTER — Ambulatory Visit: Payer: Medicare Other | Admitting: Cardiovascular Disease

## 2023-08-27 VITALS — BP 138/72 | HR 80 | Ht 74.0 in | Wt 153.2 lb

## 2023-08-27 DIAGNOSIS — R9439 Abnormal result of other cardiovascular function study: Secondary | ICD-10-CM

## 2023-08-27 DIAGNOSIS — I739 Peripheral vascular disease, unspecified: Secondary | ICD-10-CM

## 2023-08-27 DIAGNOSIS — I998 Other disorder of circulatory system: Secondary | ICD-10-CM | POA: Diagnosis not present

## 2023-08-27 DIAGNOSIS — E782 Mixed hyperlipidemia: Secondary | ICD-10-CM

## 2023-08-27 DIAGNOSIS — I259 Chronic ischemic heart disease, unspecified: Secondary | ICD-10-CM

## 2023-08-27 DIAGNOSIS — R0602 Shortness of breath: Secondary | ICD-10-CM

## 2023-08-27 MED ORDER — METOPROLOL TARTRATE 50 MG PO TABS: ORAL_TABLET | ORAL | 0 refills | Status: DC

## 2023-08-27 NOTE — Progress Notes (Signed)
Cardiology Office Note   Date:  08/27/2023   ID:  Jeremy Padilla, DOB 1966/05/12, MRN [id number]  PCP:  Marisue Ivan, NP  Cardiologist:  Adrian Blackwater, MD      History of Present Illness: Jeremy Padilla is a 57 y.o. male who presents for No chief complaint on file.   Feeling fine      Past Medical History:  Diagnosis Date   Abscess 10/05/15   Diabetes mellitus without complication (HCC)    Herpes exposure    Hyperlipidemia    Hypertension      Past Surgical History:  Procedure Laterality Date   AMPUTATION Left 06/23/2020   Procedure: AMPUTATION ABOVE KNEE;  Surgeon: Annice Needy, MD;  Location: [facility];  Service: General;  Laterality: Left;   LOWER EXTREMITY ANGIOGRAPHY Left 04/09/2019   Procedure: LOWER EXTREMITY ANGIOGRAPHY;  Surgeon: Annice Needy, MD;  Location: [facility];  Service: Cardiovascular;  Laterality: Left;   LOWER EXTREMITY ANGIOGRAPHY Left 04/19/2020   Procedure: Lower Extremity Angiography;  Surgeon: Annice Needy, MD;  Location: [facility];  Service: Cardiovascular;  Laterality: Left;   LOWER EXTREMITY ANGIOGRAPHY Left 04/20/2020   Procedure: Lower Extremity Angiography;  Surgeon: Annice Needy, MD;  Location: [facility];  Service: Cardiovascular;  Laterality: Left;   LOWER EXTREMITY INTERVENTION N/A 05/30/2019   Procedure: LOWER EXTREMITY INTERVENTION;  Surgeon: Renford Dills, MD;  Location: [facility];  Service: Cardiovascular;  Laterality: N/A;   WRIST SURGERY Left      Current Outpatient Medications  Medication Sig Dispense Refill   metoprolol tartrate (LOPRESSOR) 50 MG tablet Take 1 tab night before and 1 tab 90 minutes prior to cta coronaries next morning 3 tablet 0   ascorbic acid (VITAMIN C) 250 MG tablet Take 1 tablet (250 mg total) by mouth 2 (two) times daily. 60 tablet 0   aspirin EC 81 MG tablet Take 81 mg by mouth daily.     Bempedoic Acid-Ezetimibe (NEXLIZET) 180-10 MG TABS Take  1 tablet by mouth daily.     ferrous sulfate 325 (65 FE) MG tablet Take 1 tablet (325 mg total) by mouth 2 (two) times daily with a meal. 60 tablet 3   glipiZIDE (GLUCOTROL XL) 5 MG 24 hr tablet Take 1 tablet (5 mg total) by mouth daily. 30 tablet 4   lisinopril (ZESTRIL) 40 MG tablet Take 40 mg by mouth daily.     meloxicam (MOBIC) 15 MG tablet Take 1 tablet (15 mg total) by mouth daily. 30 tablet 0   No current facility-administered medications for this visit.    Allergies:   Penicillins and Statins    Social History:   reports that he has never smoked. He has never been exposed to tobacco smoke. He has never used smokeless tobacco. He reports that he does not drink alcohol and does not use drugs.   Family History:  family history includes Heart disease in his father; Hyperlipidemia in his father; Hypertension in his father.    ROS:     Review of Systems  Constitutional: Negative.   HENT: Negative.    Eyes: Negative.   Respiratory: Negative.    Gastrointestinal: Negative.   Genitourinary: Negative.   Musculoskeletal: Negative.   Skin: Negative.   Neurological: Negative.   Endo/Heme/Allergies: Negative.   Psychiatric/Behavioral: Negative.    All other systems reviewed and are negative.     All other systems are reviewed and negative.  PHYSICAL EXAM: VS:  BP 138/72   Pulse 80   Ht 6\' 2"  (1.88 m)   Wt 153 lb 3.2 oz (69.5 kg)   SpO2 99%   BMI 19.67 kg/m  , BMI Body mass index is 19.67 kg/m. Last weight:  Wt Readings from Last 3 Encounters:  08/27/23 153 lb 3.2 oz (69.5 kg)  08/20/23 156 lb 12.8 oz (71.1 kg)  08/10/23 146 lb 3.2 oz (66.3 kg)     Physical Exam Vitals reviewed.  Constitutional:      Appearance: Normal appearance. He is normal weight.  HENT:     Head: Normocephalic.     Nose: Nose normal.     Mouth/Throat:     Mouth: Mucous membranes are moist.  Eyes:     Pupils: Pupils are equal, round, and reactive to light.  Cardiovascular:     Rate  and Rhythm: Normal rate and regular rhythm.     Pulses: Normal pulses.     Heart sounds: Normal heart sounds.  Pulmonary:     Effort: Pulmonary effort is normal.  Abdominal:     General: Abdomen is flat. Bowel sounds are normal.  Musculoskeletal:        General: Normal range of motion.     Cervical back: Normal range of motion.  Skin:    General: Skin is warm.  Neurological:     General: No focal deficit present.     Mental Status: He is alert.  Psychiatric:        Mood and Affect: Mood normal.       EKG:   Recent Labs: 07/17/2023: ALT 19; BUN 32; Creatinine, Ser 1.06; Hemoglobin 15.6; Platelets 208; Potassium 3.8; Sodium 140; TSH 0.644    Lipid Panel    Component Value Date/Time   CHOL 177 07/17/2023 1110   TRIG 68 07/17/2023 1110   HDL 55 07/17/2023 1110   CHOLHDL 3.2 07/17/2023 1110   LDLCALC 109 (H) 07/17/2023 1110      Other studies Reviewed: Additional studies/ records that were reviewed today include:  Review of the above records demonstrates:      08/03/2017    9:39 AM  PAD Screen  Previous PAD dx? No  Previous surgical procedure? No  Pain with walking? No  Feet/toe relief with dangling? No  Painful, non-healing ulcers? No  Extremities discolored? No      ASSESSMENT AND PLAN:    ICD-10-CM   1. PAD (peripheral artery disease) (HCC)  I73.9 CT CORONARY MORPH W/CTA COR W/SCORE W/CA W/CM &/OR WO/CM    Basic metabolic panel    metoprolol tartrate (LOPRESSOR) 50 MG tablet    2. Ischemia of left lower extremity  I99.8 CT CORONARY MORPH W/CTA COR W/SCORE W/CA W/CM &/OR WO/CM    Basic metabolic panel    metoprolol tartrate (LOPRESSOR) 50 MG tablet    3. Mixed hyperlipidemia  E78.2 CT CORONARY MORPH W/CTA COR W/SCORE W/CA W/CM &/OR WO/CM    Basic metabolic panel    metoprolol tartrate (LOPRESSOR) 50 MG tablet    4. Abnormal nuclear stress test  R94.39 CT CORONARY MORPH W/CTA COR W/SCORE W/CA W/CM &/OR WO/CM    Basic metabolic panel    metoprolol  tartrate (LOPRESSOR) 50 MG tablet   Ischaemia RCA, LCX terrritory, advise    5. SOB (shortness of breath)  R06.02 CT CORONARY MORPH W/CTA COR W/SCORE W/CA W/CM &/OR WO/CM    Basic metabolic panel    metoprolol tartrate (LOPRESSOR) 50 MG tablet  6. Chest pain due to myocardial ischemia, unspecified ischemic chest pain type  I25.9 CT CORONARY MORPH W/CTA COR W/SCORE W/CA W/CM &/OR WO/CM    Basic metabolic panel    metoprolol tartrate (LOPRESSOR) 50 MG tablet       Problem List Items Addressed This Visit       Cardiovascular and Mediastinum   PAD (peripheral artery disease) (HCC) - Primary   Relevant Medications   metoprolol tartrate (LOPRESSOR) 50 MG tablet   Other Relevant Orders   CT CORONARY MORPH W/CTA COR W/SCORE W/CA W/CM &/OR WO/CM   Basic metabolic panel   Ischemia of left lower extremity   Relevant Medications   metoprolol tartrate (LOPRESSOR) 50 MG tablet   Other Relevant Orders   CT CORONARY MORPH W/CTA COR W/SCORE W/CA W/CM &/OR WO/CM   Basic metabolic panel     Other   Mixed hyperlipidemia   Relevant Medications   metoprolol tartrate (LOPRESSOR) 50 MG tablet   Other Relevant Orders   CT CORONARY MORPH W/CTA COR W/SCORE W/CA W/CM &/OR WO/CM   Basic metabolic panel   Other Visit Diagnoses     Abnormal nuclear stress test       Ischaemia RCA, LCX terrritory, advise   Relevant Medications   metoprolol tartrate (LOPRESSOR) 50 MG tablet   Other Relevant Orders   CT CORONARY MORPH W/CTA COR W/SCORE W/CA W/CM &/OR WO/CM   Basic metabolic panel   SOB (shortness of breath)       Relevant Medications   metoprolol tartrate (LOPRESSOR) 50 MG tablet   Other Relevant Orders   CT CORONARY MORPH W/CTA COR W/SCORE W/CA W/CM &/OR WO/CM   Basic metabolic panel   Chest pain due to myocardial ischemia, unspecified ischemic chest pain type       Relevant Medications   metoprolol tartrate (LOPRESSOR) 50 MG tablet   Other Relevant Orders   CT CORONARY MORPH W/CTA COR  W/SCORE W/CA W/CM &/OR WO/CM   Basic metabolic panel          Disposition:   Return in about 4 weeks (around 09/24/2023) for ccta and f/u.    Total time spent: 30 minutes  Signed,  Adrian Blackwater, MD  08/27/2023 1:54 PM    [facility]

## 2023-08-28 ENCOUNTER — Ambulatory Visit: Payer: Medicare Other | Admitting: Cardiology

## 2023-09-20 ENCOUNTER — Other Ambulatory Visit: Payer: Medicare Other

## 2023-09-21 LAB — BASIC METABOLIC PANEL
BUN/Creatinine Ratio: 25 — ABNORMAL HIGH (ref 9–20)
BUN: 25 mg/dL — ABNORMAL HIGH (ref 6–24)
CO2: 24 mmol/L (ref 20–29)
Calcium: 9.6 mg/dL (ref 8.7–10.2)
Chloride: 99 mmol/L (ref 96–106)
Creatinine, Ser: 0.99 mg/dL (ref 0.76–1.27)
Glucose: 138 mg/dL — ABNORMAL HIGH (ref 70–99)
Potassium: 4.3 mmol/L (ref 3.5–5.2)
Sodium: 139 mmol/L (ref 134–144)
eGFR: 88 mL/min/{1.73_m2} (ref >59)

## 2023-09-21 LAB — URIC ACID: Uric Acid: 5.8 mg/dL (ref 3.8–8.4)

## 2023-09-25 ENCOUNTER — Ambulatory Visit (INDEPENDENT_AMBULATORY_CARE_PROVIDER_SITE_OTHER): Payer: Medicare Other

## 2023-09-25 DIAGNOSIS — R9439 Abnormal result of other cardiovascular function study: Secondary | ICD-10-CM

## 2023-09-25 DIAGNOSIS — R0602 Shortness of breath: Secondary | ICD-10-CM

## 2023-09-25 DIAGNOSIS — I739 Peripheral vascular disease, unspecified: Secondary | ICD-10-CM

## 2023-09-25 DIAGNOSIS — I998 Other disorder of circulatory system: Secondary | ICD-10-CM

## 2023-09-25 DIAGNOSIS — E782 Mixed hyperlipidemia: Secondary | ICD-10-CM

## 2023-09-25 DIAGNOSIS — I259 Chronic ischemic heart disease, unspecified: Secondary | ICD-10-CM | POA: Diagnosis not present

## 2023-09-25 MED ORDER — IOHEXOL 350 MG/ML SOLN
100.0000 mL | Freq: Once | INTRAVENOUS | Status: AC | PRN
  Administered 2023-09-25: 100 mL via INTRAVENOUS

## 2023-09-28 ENCOUNTER — Ambulatory Visit (INDEPENDENT_AMBULATORY_CARE_PROVIDER_SITE_OTHER): Payer: Medicare Other | Admitting: Cardiovascular Disease

## 2023-09-28 ENCOUNTER — Encounter: Payer: Self-pay | Admitting: Cardiovascular Disease

## 2023-09-28 VITALS — BP 130/70 | HR 62 | Ht 74.0 in | Wt 155.4 lb

## 2023-09-28 DIAGNOSIS — R739 Hyperglycemia, unspecified: Secondary | ICD-10-CM

## 2023-09-28 DIAGNOSIS — R0602 Shortness of breath: Secondary | ICD-10-CM

## 2023-09-28 DIAGNOSIS — E782 Mixed hyperlipidemia: Secondary | ICD-10-CM

## 2023-09-28 DIAGNOSIS — I998 Other disorder of circulatory system: Secondary | ICD-10-CM | POA: Diagnosis not present

## 2023-09-28 DIAGNOSIS — I259 Chronic ischemic heart disease, unspecified: Secondary | ICD-10-CM

## 2023-09-28 DIAGNOSIS — I739 Peripheral vascular disease, unspecified: Secondary | ICD-10-CM | POA: Diagnosis not present

## 2023-09-28 MED ORDER — NEXLIZET 180-10 MG PO TABS
1.0000 | ORAL_TABLET | Freq: Every day | ORAL | 1 refills | Status: DC
Start: 2023-09-28 — End: 2024-04-18

## 2023-09-28 NOTE — Progress Notes (Signed)
informed

## 2023-09-28 NOTE — Progress Notes (Signed)
Cardiology Office Note   Date:  09/28/2023   ID:  Jeremy Padilla, DOB 05-10-1966, MRN [id number]  PCP:  Marisue Ivan, NP  Cardiologist:  Adrian Blackwater, MD      History of Present Illness: Jeremy Padilla is a 57 y.o. male who presents for  Chief Complaint  Patient presents with   Follow-up    Ccta results    Patient denies any chest pain or shortness of breath      Past Medical History:  Diagnosis Date   Abscess 10/05/15   Diabetes mellitus without complication (HCC)    Herpes exposure    Hyperlipidemia    Hypertension      Past Surgical History:  Procedure Laterality Date   AMPUTATION Left 06/23/2020   Procedure: AMPUTATION ABOVE KNEE;  Surgeon: Annice Needy, MD;  Location: [facility] ORS;  Service: General;  Laterality: Left;   LOWER EXTREMITY ANGIOGRAPHY Left 04/09/2019   Procedure: LOWER EXTREMITY ANGIOGRAPHY;  Surgeon: Annice Needy, MD;  Location: [facility];  Service: Cardiovascular;  Laterality: Left;   LOWER EXTREMITY ANGIOGRAPHY Left 04/19/2020   Procedure: Lower Extremity Angiography;  Surgeon: Annice Needy, MD;  Location: [facility];  Service: Cardiovascular;  Laterality: Left;   LOWER EXTREMITY ANGIOGRAPHY Left 04/20/2020   Procedure: Lower Extremity Angiography;  Surgeon: Annice Needy, MD;  Location: [facility];  Service: Cardiovascular;  Laterality: Left;   LOWER EXTREMITY INTERVENTION N/A 05/30/2019   Procedure: LOWER EXTREMITY INTERVENTION;  Surgeon: Renford Dills, MD;  Location: [facility];  Service: Cardiovascular;  Laterality: N/A;   WRIST SURGERY Left      Current Outpatient Medications  Medication Sig Dispense Refill   ascorbic acid (VITAMIN C) 250 MG tablet Take 1 tablet (250 mg total) by mouth 2 (two) times daily. 60 tablet 0   aspirin EC 81 MG tablet Take 81 mg by mouth daily.     Bempedoic Acid-Ezetimibe (NEXLIZET) 180-10 MG TABS Take 1 tablet by mouth daily. 90 tablet 1   ferrous  sulfate 325 (65 FE) MG tablet Take 1 tablet (325 mg total) by mouth 2 (two) times daily with a meal. 60 tablet 3   glipiZIDE (GLUCOTROL XL) 5 MG 24 hr tablet Take 1 tablet (5 mg total) by mouth daily. 30 tablet 4   lisinopril (ZESTRIL) 40 MG tablet Take 40 mg by mouth daily.     meloxicam (MOBIC) 15 MG tablet Take 1 tablet (15 mg total) by mouth daily. 30 tablet 0   metoprolol tartrate (LOPRESSOR) 50 MG tablet Take 1 tab night before and 1 tab 90 minutes prior to cta coronaries next morning 3 tablet 0   No current facility-administered medications for this visit.    Allergies:   Penicillins and Statins    Social History:   reports that he has never smoked. He has never been exposed to tobacco smoke. He has never used smokeless tobacco. He reports that he does not drink alcohol and does not use drugs.   Family History:  family history includes Heart disease in his father; Hyperlipidemia in his father; Hypertension in his father.    ROS:     Review of Systems  Constitutional: Negative.   HENT: Negative.    Eyes: Negative.   Respiratory: Negative.    Gastrointestinal: Negative.   Genitourinary: Negative.   Musculoskeletal: Negative.   Skin: Negative.   Neurological: Negative.   Endo/Heme/Allergies: Negative.   Psychiatric/Behavioral: Negative.  All other systems reviewed and are negative.     All other systems are reviewed and negative.    PHYSICAL EXAM: VS:  BP 130/70   Pulse 62   Ht 6\' 2"  (1.88 m)   Wt 155 lb 6.4 oz (70.5 kg)   SpO2 98%   BMI 19.95 kg/m  , BMI Body mass index is 19.95 kg/m. Last weight:  Wt Readings from Last 3 Encounters:  09/28/23 155 lb 6.4 oz (70.5 kg)  08/27/23 153 lb 3.2 oz (69.5 kg)  08/20/23 156 lb 12.8 oz (71.1 kg)     Physical Exam    EKG:   Recent Labs: 07/17/2023: ALT 19; Hemoglobin 15.6; Platelets 208; TSH 0.644 09/20/2023: BUN 25; Creatinine, Ser 0.99; Potassium 4.3; Sodium 139    Lipid Panel    Component Value Date/Time    CHOL 177 07/17/2023 1110   TRIG 68 07/17/2023 1110   HDL 55 07/17/2023 1110   CHOLHDL 3.2 07/17/2023 1110   LDLCALC 109 (H) 07/17/2023 1110      Other studies Reviewed: Additional studies/ records that were reviewed today include:  Review of the above records demonstrates:      08/03/2017    9:39 AM  PAD Screen  Previous PAD dx? No  Previous surgical procedure? No  Pain with walking? No  Feet/toe relief with dangling? No  Painful, non-healing ulcers? No  Extremities discolored? No      ASSESSMENT AND PLAN:    ICD-10-CM   1. PAD (peripheral artery disease) (HCC)  I73.9 Bempedoic Acid-Ezetimibe (NEXLIZET) 180-10 MG TABS    2. Ischemia of left lower extremity  I99.8 Bempedoic Acid-Ezetimibe (NEXLIZET) 180-10 MG TABS    3. Mixed hyperlipidemia  E78.2 Bempedoic Acid-Ezetimibe (NEXLIZET) 180-10 MG TABS    4. SOB (shortness of breath)  R06.02 Bempedoic Acid-Ezetimibe (NEXLIZET) 180-10 MG TABS    5. Chest pain due to myocardial ischemia, unspecified ischemic chest pain type  I25.9 Bempedoic Acid-Ezetimibe (NEXLIZET) 180-10 MG TABS   CCTA showed calcium score to 229 with normal coronaries.  Continue Nexlizet and aspirin.    6. Hyperglycemia  R73.9 Bempedoic Acid-Ezetimibe (NEXLIZET) 180-10 MG TABS       Problem List Items Addressed This Visit       Cardiovascular and Mediastinum   PAD (peripheral artery disease) (HCC) - Primary   Relevant Medications   Bempedoic Acid-Ezetimibe (NEXLIZET) 180-10 MG TABS   Ischemia of left lower extremity   Relevant Medications   Bempedoic Acid-Ezetimibe (NEXLIZET) 180-10 MG TABS     Other   Hyperglycemia   Relevant Medications   Bempedoic Acid-Ezetimibe (NEXLIZET) 180-10 MG TABS   Mixed hyperlipidemia   Relevant Medications   Bempedoic Acid-Ezetimibe (NEXLIZET) 180-10 MG TABS   Other Visit Diagnoses     SOB (shortness of breath)       Relevant Medications   Bempedoic Acid-Ezetimibe (NEXLIZET) 180-10 MG TABS   Chest pain due  to myocardial ischemia, unspecified ischemic chest pain type       CCTA showed calcium score to 229 with normal coronaries.  Continue Nexlizet and aspirin.   Relevant Medications   Bempedoic Acid-Ezetimibe (NEXLIZET) 180-10 MG TABS          Disposition:   Return in about 3 months (around 12/29/2023).    Total time spent: 30 minutes  Signed,  Adrian Blackwater, MD  09/28/2023 3:16 PM    [facility]

## 2023-10-08 ENCOUNTER — Telehealth: Payer: Self-pay | Admitting: Cardiology

## 2023-10-08 NOTE — Telephone Encounter (Signed)
Patient left VM that he needs a new cholesterol med sent in. The one we sent him is not covered by his insurance. Please advise.

## 2023-10-09 ENCOUNTER — Other Ambulatory Visit: Payer: Self-pay | Admitting: Urology

## 2023-10-23 ENCOUNTER — Ambulatory Visit: Payer: Medicare Other | Admitting: Cardiology

## 2023-10-30 ENCOUNTER — Ambulatory Visit: Payer: Medicare Other | Admitting: Cardiology

## 2023-12-20 ENCOUNTER — Ambulatory Visit: Payer: Medicare Other | Admitting: Cardiology

## 2023-12-20 DIAGNOSIS — Z23 Encounter for immunization: Secondary | ICD-10-CM

## 2023-12-28 ENCOUNTER — Ambulatory Visit: Payer: Medicare Other | Admitting: Cardiovascular Disease

## 2024-01-24 NOTE — Patient Instructions (Addendum)
SURGICAL WAITING ROOM VISITATION  Patients having surgery or a procedure may have no more than 2 support people in the waiting area - these visitors may rotate.    Children under the age of 17 must have an adult with them who is not the patient.  Due to an increase in RSV and influenza rates and associated hospitalizations, children ages 56 and under may not visit patients in [facility] hospitals.  Visitors with respiratory illnesses are discouraged from visiting and should remain at home.  If the patient needs to stay at the hospital during part of their recovery, the visitor guidelines for inpatient rooms apply. Pre-op nurse will coordinate an appropriate time for 1 support person to accompany patient in pre-op.  This support person may not rotate.    Please refer to the [facility] website for the visitor guidelines for Inpatients (after your surgery is over and you are in a regular room).       Your procedure is scheduled on: 02/05/24   Report to [facility] Main Entrance    Report to admitting at  5:15 AM   Call this number if you have problems the morning of surgery [phone]   Do not eat food or drink liquids:After Midnight. Except sips of water to take meds.   After Midnight you may have the following liquids until ______ AM/ PM DAY OF SURGERY     Oral Hygiene is also important to reduce your risk of infection.                                    Remember - BRUSH YOUR TEETH THE MORNING OF SURGERY WITH YOUR REGULAR TOOTHPASTE  DENTURES WILL BE REMOVED PRIOR TO SURGERY PLEASE DO NOT APPLY "Poly grip" OR ADHESIVES!!!   Do NOT smoke after Midnight   Stop all vitamins and herbal supplements 7 days before surgery.   Take these medicines the morning of surgery with A SIP OF WATER:  Nexlizet(Bempedoic Acid-Ezetimibe), Tylenol if needed.               Do not take Lisinopril the morning of surgery.  DO NOT TAKE ANY ORAL DIABETIC MEDICATIONS DAY OF YOUR SURGERY              You may not have any metal on your body including hair pins, jewelry, and body piercing             Do not wear make-up, lotions, powders, perfumes/cologne, or deodorant              Men may shave face and neck.   Do not bring valuables to the hospital. [facility] IS NOT             RESPONSIBLE   FOR VALUABLES.   Contacts, glasses, dentures or bridgework may not be worn into surgery.  DO NOT BRING YOUR HOME MEDICATIONS TO THE HOSPITAL. PHARMACY WILL DISPENSE MEDICATIONS LISTED ON YOUR MEDICATION LIST TO YOU DURING YOUR ADMISSION IN THE HOSPITAL!    Patients discharged on the day of surgery will not be allowed to drive home.  Someone NEEDS to stay with you for the first 24 hours after anesthesia.   Special Instructions: Bring a copy of your healthcare power of attorney and living will documents the day of surgery if you haven't scanned them before.  Please read over the following fact sheets you were given: IF YOU HAVE QUESTIONS ABOUT YOUR PRE-OP INSTRUCTIONS PLEASE CALL [phone] [place] [place]   If you received a COVID test during your pre-op visit  it is requested that you wear a mask when out in public, stay away from anyone that may not be feeling well and notify your surgeon if you develop symptoms. If you test positive for Covid or have been in contact with anyone that has tested positive in the last 10 days please notify you surgeon.    [place] - Preparing for Surgery Before surgery, you can play an important role.  Because skin is not sterile, your skin needs to be as free of germs as possible.  You can reduce the number of germs on your skin by washing with CHG (chlorahexidine gluconate) soap before surgery.  CHG is an antiseptic cleaner which kills germs and bonds with the skin to continue killing germs even after washing. Please DO NOT use if you have an allergy to CHG or antibacterial soaps.  If your skin becomes reddened/irritated stop using the CHG and  inform your nurse when you arrive at Short Stay. Do not shave (including legs and underarms) for at least 48 hours prior to the first CHG shower.  You may shave your face/neck.  Please follow these instructions carefully:  1.  Shower with CHG Soap the night before surgery and the  morning of surgery.  2.  If you choose to wash your hair, wash your hair first as usual with your normal  shampoo.  3.  After you shampoo, rinse your hair and body thoroughly to remove the shampoo.                             4.  Use CHG as you would any other liquid soap.  You can apply chg directly to the skin and wash.  Gently with a scrungie or clean washcloth.  5.  Apply the CHG Soap to your body ONLY FROM THE NECK DOWN.   Do   not use on face/ open                           Wound or open sores. Avoid contact with eyes, ears mouth and   genitals (private parts).                       Wash face,  Genitals (private parts) with your normal soap.             6.  Wash thoroughly, paying special attention to the area where your    surgery  will be performed.  7.  Thoroughly rinse your body with warm water from the neck down.  8.  DO NOT shower/wash with your normal soap after using and rinsing off the CHG Soap.                9.  Pat yourself dry with a clean towel.            10.  Wear clean pajamas.            11.  Place clean sheets on your bed the night of your first shower and do not  sleep with pets. Day of Surgery : Do not apply any lotions/deodorants the morning of surgery.  Please wear clean clothes to the hospital/surgery center.  FAILURE TO FOLLOW THESE INSTRUCTIONS MAY RESULT IN THE CANCELLATION OF YOUR SURGERY  PATIENT SIGNATURE_________________________________  NURSE SIGNATURE__________________________________  ________________________________________________________________________

## 2024-01-24 NOTE — Progress Notes (Addendum)
COVID Vaccine received:  []  No [x]  Yes Date of any COVID positive Test in last 90 days: no PCP - Marisue Ivan NP Cardiologist - Adrian Blackwater MD @ [facility]  Chest x-ray -  EKG -  08/10/23 Epic Stress Test - 08/21/23 Epic ECHO - 08/23/23 Epic Cardiac Cath -   Bowel Prep - [x]  No  []   Yes ______  Pacemaker / ICD device [x]  No []  Yes   Spinal Cord Stimulator:[x]  No []  Yes       History of Sleep Apnea? [x]  No []  Yes   CPAP used?- [x]  No []  Yes    Does the patient monitor blood sugar?          [x]  No []  Yes  []  N/A  Patient has: []  NO Hx DM   [x]  Pre-DM                 []  DM1  []   DM2 Does patient have a [employer] or [employer]? []  No []  Yes   Fasting Blood Sugar Ranges-  Checks Blood Sugar ____0_ times a day  GLP1 agonist / usual dose - no GLP1 instructions:  SGLT-2 inhibitors / usual dose - no SGLT-2 instructions:   Blood Thinner / Instructions:no Aspirin Instructions:ASA 81 mg-Pt. Will call MD office to see when to stop.  Comments:   Activity level: Patient is able  to climb a flight of stairs without difficulty; [x]  No CP  [x]  No SOB, _   Patient can  perform ADLs without assistance.   Anesthesia review: HTN, DM, Murmur, PAD  Patient denies shortness of breath, fever, cough and chest pain at PAT appointment.  Patient verbalized understanding and agreement to the Pre-Surgical Instructions that were given to them at this PAT appointment. Patient was also educated of the need to review these PAT instructions again prior to his/her surgery.I reviewed the appropriate phone numbers to call if they have any and questions or concerns.

## 2024-01-25 ENCOUNTER — Other Ambulatory Visit: Payer: Self-pay

## 2024-01-25 ENCOUNTER — Encounter (HOSPITAL_COMMUNITY)
Admission: RE | Admit: 2024-01-25 | Discharge: 2024-01-25 | Disposition: A | Discharge status: Home / Self Care | Payer: Medicare Other | Source: Ambulatory Visit | Attending: Urology | Admitting: Urology

## 2024-01-25 ENCOUNTER — Encounter (HOSPITAL_COMMUNITY): Payer: Self-pay

## 2024-01-25 VITALS — BP 166/58 | HR 75 | Temp 98.0°F | Resp 16 | Ht 74.0 in | Wt 144.0 lb

## 2024-01-25 DIAGNOSIS — I10 Essential (primary) hypertension: Secondary | ICD-10-CM | POA: Diagnosis not present

## 2024-01-25 DIAGNOSIS — E119 Type 2 diabetes mellitus without complications: Secondary | ICD-10-CM | POA: Diagnosis not present

## 2024-01-25 DIAGNOSIS — Z01812 Encounter for preprocedural laboratory examination: Secondary | ICD-10-CM | POA: Diagnosis present

## 2024-01-25 DIAGNOSIS — Z79899 Other long term (current) drug therapy: Secondary | ICD-10-CM | POA: Diagnosis not present

## 2024-01-25 HISTORY — DX: Cardiac murmur, unspecified: R01.1

## 2024-01-25 LAB — HEMOGLOBIN A1C
Hgb A1c MFr Bld: 5.8 % — ABNORMAL HIGH (ref 4.8–5.6)
Mean Plasma Glucose: 119.76 mg/dL

## 2024-01-25 LAB — BASIC METABOLIC PANEL
Anion gap: 6 (ref 5–15)
BUN: 26 mg/dL — ABNORMAL HIGH (ref 6–20)
CO2: 27 mmol/L (ref 22–32)
Calcium: 9.5 mg/dL (ref 8.9–10.3)
Chloride: 105 mmol/L (ref 98–111)
Creatinine, Ser: 1.02 mg/dL (ref 0.61–1.24)
GFR, Estimated: >60 mL/min (ref >60)
Glucose, Bld: 135 mg/dL — ABNORMAL HIGH (ref 70–99)
Potassium: 4.5 mmol/L (ref 3.5–5.1)
Sodium: 138 mmol/L (ref 135–145)

## 2024-01-25 LAB — CBC
HCT: 45.6 % (ref 39.0–52.0)
Hemoglobin: 15.4 g/dL (ref 13.0–17.0)
MCH: 29.8 pg (ref 26.0–34.0)
MCHC: 33.8 g/dL (ref 30.0–36.0)
MCV: 88.4 fL (ref 80.0–100.0)
Platelets: 197 10*3/uL (ref 150–400)
RBC: 5.16 MIL/uL (ref 4.22–5.81)
RDW: 12.7 % (ref 11.5–15.5)
WBC: 5.3 10*3/uL (ref 4.0–10.5)
nRBC: 0 % (ref 0.0–0.2)

## 2024-01-25 LAB — GLUCOSE, CAPILLARY: Glucose-Capillary: 144 mg/dL — ABNORMAL HIGH (ref 70–99)

## 2024-01-27 LAB — URINE CULTURE: Culture: >=100000 — AB

## 2024-01-28 NOTE — Progress Notes (Signed)
Case: [id number] Date/Time: 02/05/24 0715   Procedures:      INSERTION OF INFLATABLE PENILE PROSTHESIS - 135 MINUTES NEEDED FOR CASE     POSSIBLE PENILE PLICATION   Anesthesia type: General   Pre-op diagnosis: ERECTILE DYSFUNCTION, PEYRONIES DISEASE   Location: [facility] ROOM 03 / [facility]   Surgeons: Despina Arias, MD       DISCUSSION: Jeremy Padilla is a 58 year old male who presents to PAT prior to surgery above.  Past medical history significant for hypertension, hyperlipidemia, heart murmur, bigeminy, PAD status post left AKA (in 2021), hx of CVA (seen on CT head), diabetes (A1c 5.8)  Patient follows with Cardiology for hx of bigeminy on EKG with risk factors for CAD (hx of uncontrolled HTN and DM with significant PAD resulting in LLE AKA). He underwent stress testing in 08/2023 which was positive for ischemia in the RCA/LCx territory. CTA coronaries was recommended which was done in 09/2023 which showed "normal coronaries". Echo showed normal EF, mild LVH, grade I DD, mild TR, MR, AI/AS. He was advised to continue medical therapy. Last seen on 09/28/23. Per Dr. Welton Flakes: "Patient denies any chest pain or shortness of breath". Advised to f/u in 3 months. Cardiac clearance signed (scanned in media on 11/09/23)  "Patient may proceed with surgery from a cardiac standpoint. Hold ASA for 7 days prior. Restart ASA day after"  VS: BP (!) 166/58 Comment: Pt. states he forgot to take his BP med this am.  Pulse 75   Temp 36.7 C (Oral)   Resp 16   Ht 6\' 2"  (1.88 m)   Wt 65.3 kg   SpO2 96%   BMI 18.49 kg/m   PROVIDERS: [facility]   LABS: Labs reviewed: Acceptable for surgery. Ucx positive. Dr Lafonda Mosses aware and will rx antibiotics. (all labs ordered are listed, but only abnormal results are displayed)  Labs Reviewed  URINE CULTURE - Abnormal; Notable for the following components:      Result Value   Culture >=100,000 COLONIES/mL STAPHYLOCOCCUS EPIDERMIDIS (*)    Organism ID,  Bacteria STAPHYLOCOCCUS EPIDERMIDIS (*)    All other components within normal limits  HEMOGLOBIN A1C - Abnormal; Notable for the following components:   Hgb A1c MFr Bld 5.8 (*)    All other components within normal limits  BASIC METABOLIC PANEL - Abnormal; Notable for the following components:   Glucose, Bld 135 (*)    BUN 26 (*)    All other components within normal limits  GLUCOSE, CAPILLARY - Abnormal; Notable for the following components:   Glucose-Capillary 144 (*)    All other components within normal limits  CBC     IMAGES:   EKG:   CV:  CTA coronary 09/25/23:  TEST CONCLUSIONS Quality of study: Good 1. Calcium score: 229 2. Right dominant system 3. Normal coronaries.   Echo 08/23/23:  ASSESSMENT Technically adequate study. Normal left ventricular systolic function. Mild left ventricular hypertrophy with GRADE 1 (relaxation abnormality) diastolic dysfunction. Normal right ventricular systolic function. Normal right ventricular diastolic function. Normal left ventricular wall motion. Normal right ventricular wall motion. Mild tricuspid regurgitation. Normal pulmonary artery pressure. Mild mitral regurgitation. Fibrocalcified aortic valve with mild aortic stenosis and  mild aortic regurgitation. No pericardial effusion. Moderately dilated left atrium Mild left ventricle hypertrophy  Stress test 08/21/23:  STRESSED BY: Adrian Blackwater, MD PROTOCOL: Persantine     DOSE  ADMINISTERED: 8.1 cc              ROUTE OF  ADMINISTRATION: IV MAX PRED HR: 161              85%: 137                                75%: 121 RESTING BP:  160/86            RESTING HR:  74       PEAK BP: 150/68       PEAK HR: 85   EXERCISE DURATION: 4 min injection REASON FOR TEST TERMINATION: Protocol end SYMPTOMS: None EKG RESULTS: NSR. 75/min. Frequent PVC's. No significant ST changes with persantine. IMAGE QUALITY: Good PERFUSION/WALL MOTION FINDINGS: EF = 62%. Moderate size and  intensity reversible basal and mid inferior and inferolateral, apical inferior wall defects, normal wall motion.    IMPRESSION: Ischemia in the RCA/LCX territories with normal LVEF, advise further workup as in CCTA.   Past Medical History:  Diagnosis Date   Abscess 10/05/2015   Diabetes mellitus without complication (HCC)    Heart murmur    Herpes exposure    Hyperlipidemia    Hypertension     Past Surgical History:  Procedure Laterality Date   AMPUTATION Left 06/23/2020   Procedure: AMPUTATION ABOVE KNEE;  Surgeon: Annice Needy, MD;  Location: [facility] ORS;  Service: General;  Laterality: Left;   LOWER EXTREMITY ANGIOGRAPHY Left 04/09/2019   Procedure: LOWER EXTREMITY ANGIOGRAPHY;  Surgeon: Annice Needy, MD;  Location: [facility];  Service: Cardiovascular;  Laterality: Left;   LOWER EXTREMITY ANGIOGRAPHY Left 04/19/2020   Procedure: Lower Extremity Angiography;  Surgeon: Annice Needy, MD;  Location: [facility];  Service: Cardiovascular;  Laterality: Left;   LOWER EXTREMITY ANGIOGRAPHY Left 04/20/2020   Procedure: Lower Extremity Angiography;  Surgeon: Annice Needy, MD;  Location: [facility];  Service: Cardiovascular;  Laterality: Left;   LOWER EXTREMITY INTERVENTION N/A 05/30/2019   Procedure: LOWER EXTREMITY INTERVENTION;  Surgeon: Renford Dills, MD;  Location: [facility];  Service: Cardiovascular;  Laterality: N/A;   WRIST SURGERY Left     MEDICATIONS:  acetaminophen (TYLENOL) 500 MG tablet   ascorbic acid (VITAMIN C) 250 MG tablet   aspirin EC 81 MG tablet   Bempedoic Acid-Ezetimibe (NEXLIZET) 180-10 MG TABS   Cholecalciferol (VITAMIN D-3) 125 MCG (5000 UT) TABS   ferrous sulfate 325 (65 FE) MG tablet   glipiZIDE (GLUCOTROL XL) 5 MG 24 hr tablet   lisinopril (ZESTRIL) 40 MG tablet   Magnesium 250 MG TABS   meloxicam (MOBIC) 15 MG tablet   metoprolol tartrate (LOPRESSOR) 50 MG tablet   Probiotic Product (ALIGN PO)   No current  facility-administered medications for this encounter.   Marcille Blanco [facility] Short Stay/Anesthesiology [facility] Phone [phone] 01/31/2024 10:42 AM

## 2024-01-29 ENCOUNTER — Ambulatory Visit (INDEPENDENT_AMBULATORY_CARE_PROVIDER_SITE_OTHER): Payer: Medicare Other | Admitting: Cardiology

## 2024-01-29 ENCOUNTER — Encounter: Payer: Self-pay | Admitting: Cardiology

## 2024-01-29 VITALS — BP 142/82 | HR 88 | Ht 74.0 in | Wt 150.0 lb

## 2024-01-29 DIAGNOSIS — T148XXA Other injury of unspecified body region, initial encounter: Secondary | ICD-10-CM | POA: Diagnosis not present

## 2024-01-29 DIAGNOSIS — Z0130 Encounter for examination of blood pressure without abnormal findings: Secondary | ICD-10-CM | POA: Diagnosis not present

## 2024-01-29 DIAGNOSIS — M79661 Pain in right lower leg: Secondary | ICD-10-CM

## 2024-01-29 DIAGNOSIS — R7303 Prediabetes: Secondary | ICD-10-CM

## 2024-01-29 MED ORDER — MUPIROCIN CALCIUM 2 % EX CREA: 1.0000 | TOPICAL_CREAM | Freq: Two times a day (BID) | CUTANEOUS | 2 refills | Status: DC

## 2024-01-29 NOTE — Progress Notes (Signed)
Established Patient Office Visit  Subjective:  Patient ID: Jeremy Padilla, male    DOB: 1966-01-30  Age: 58 y.o. MRN: [id number]  Chief Complaint  Patient presents with   Acute Visit    R Leg Pain    Patient in office for an acute visit, complaining of right leg pain. Patient scraped his right heal and calf against a concrete step. Patient has an abrasion on right heal. Patient scheduled for urology procedure, will be starting PO antibiotics for that. Will send in Bactroban to be used topically.     No other concerns at this time.   Past Medical History:  Diagnosis Date   Abscess 10/05/2015   Diabetes mellitus without complication (HCC)    Heart murmur    Herpes exposure    Hyperlipidemia    Hypertension     Past Surgical History:  Procedure Laterality Date   AMPUTATION Left 06/23/2020   Procedure: AMPUTATION ABOVE KNEE;  Surgeon: Annice Needy, MD;  Location: [facility];  Service: General;  Laterality: Left;   LOWER EXTREMITY ANGIOGRAPHY Left 04/09/2019   Procedure: LOWER EXTREMITY ANGIOGRAPHY;  Surgeon: Annice Needy, MD;  Location: [facility];  Service: Cardiovascular;  Laterality: Left;   LOWER EXTREMITY ANGIOGRAPHY Left 04/19/2020   Procedure: Lower Extremity Angiography;  Surgeon: Annice Needy, MD;  Location: [facility];  Service: Cardiovascular;  Laterality: Left;   LOWER EXTREMITY ANGIOGRAPHY Left 04/20/2020   Procedure: Lower Extremity Angiography;  Surgeon: Annice Needy, MD;  Location: [facility];  Service: Cardiovascular;  Laterality: Left;   LOWER EXTREMITY INTERVENTION N/A 05/30/2019   Procedure: LOWER EXTREMITY INTERVENTION;  Surgeon: Renford Dills, MD;  Location: [facility];  Service: Cardiovascular;  Laterality: N/A;   WRIST SURGERY Left     Social History   Socioeconomic History   Marital status: Widowed    Spouse name: Not on file   Number of children: Not on file   Years of education: Not on file   Highest  education level: Not on file  Occupational History   Occupation: applied for disability  Tobacco Use   Smoking status: Never    Passive exposure: Never   Smokeless tobacco: Never  Vaping Use   Vaping status: Never Used  Substance and Sexual Activity   Alcohol use: Yes    Alcohol/week: 1.0 standard drink of alcohol    Types: 1 Glasses of wine per week    Comment: 1 occasionally   Drug use: No   Sexual activity: Yes  Other Topics Concern   Not on file  Social History Narrative   Wife just passed away 05-03-2009   Social Drivers of Health   Financial Resource Strain: Low Risk  (06/01/2020)   Received from [facility], [facility]   Overall Financial Resource Strain (CARDIA)    Difficulty of Paying Living Expenses: Not hard at all  Food Insecurity: No Food Insecurity (06/01/2020)   Received from [facility], [facility]   Hunger Vital Sign    Worried About Running Out of Food in the Last Year: Never true    Ran Out of Food in the Last Year: Never true  Transportation Needs: No Transportation Needs (06/01/2020)   Received from [facility], [facility]   [facility] - Transportation    Lack of Transportation (Medical): No    Lack of Transportation (Non-Medical): No  Physical Activity: Not on file  Stress: Not  on file  Social Connections: Not on file  Intimate Partner Violence: Not on file    Family History  Problem Relation Age of Onset   Heart disease Father    Hyperlipidemia Father    Hypertension Father     Allergies  Allergen Reactions   Penicillins Rash    .Has patient had a PCN reaction causing immediate rash, facial/tongue/throat swelling, SOB or lightheadedness with hypotension: Unknown Has patient had a PCN reaction causing severe rash involving mucus membranes or skin necrosis: Unknown Has patient had a PCN reaction that required hospitalization: Unknown Has patient had a PCN reaction occurring within the last 10 years: Unknown If all of  the above answers are "NO", then may proceed with Cephalosporin use.  Patient Tolerates Cephalosporins    Statins Other (See Comments)    Outpatient Medications Prior to Visit  Medication Sig   aspirin EC 81 MG tablet Take 81 mg by mouth daily.   Bempedoic Acid-Ezetimibe (NEXLIZET) 180-10 MG TABS Take 1 tablet by mouth daily.   lisinopril (ZESTRIL) 40 MG tablet Take 40 mg by mouth daily.   [DISCONTINUED] ascorbic acid (VITAMIN C) 250 MG tablet Take 1 tablet (250 mg total) by mouth 2 (two) times daily. (Patient not taking: Reported on 01/21/2024)   [DISCONTINUED] ferrous sulfate 325 (65 FE) MG tablet Take 1 tablet (325 mg total) by mouth 2 (two) times daily with a meal. (Patient not taking: Reported on 01/21/2024)   [DISCONTINUED] glipiZIDE (GLUCOTROL XL) 5 MG 24 hr tablet Take 1 tablet (5 mg total) by mouth daily. (Patient not taking: Reported on 01/21/2024)   [DISCONTINUED] meloxicam (MOBIC) 15 MG tablet Take 1 tablet (15 mg total) by mouth daily. (Patient not taking: Reported on 01/21/2024)   [DISCONTINUED] metoprolol tartrate (LOPRESSOR) 50 MG tablet Take 1 tab night before and 1 tab 90 minutes prior to cta coronaries next morning (Patient not taking: Reported on 01/21/2024)   No facility-administered medications prior to visit.    Review of Systems  Constitutional: Negative.   HENT: Negative.    Eyes: Negative.   Respiratory: Negative.  Negative for shortness of breath.   Cardiovascular: Negative.  Negative for chest pain.  Gastrointestinal: Negative.  Negative for abdominal pain, constipation and diarrhea.  Genitourinary: Negative.   Musculoskeletal:  Negative for joint pain and myalgias.  Skin: Negative.   Neurological: Negative.  Negative for dizziness and headaches.  Endo/Heme/Allergies: Negative.   All other systems reviewed and are negative.      Objective:   BP (!) 142/82   Pulse 88   Ht 6\' 2"  (1.88 m)   Wt 150 lb (68 kg)   SpO2 96%   BMI 19.26 kg/m   Vitals:    01/29/24 1402  BP: (!) 142/82  Pulse: 88  Height: 6\' 2"  (1.88 m)  Weight: 150 lb (68 kg)  SpO2: 96%  BMI (Calculated): 19.25    Physical Exam Nursing note reviewed.  Constitutional:      Appearance: Normal appearance. He is normal weight.  HENT:     Head: Normocephalic and atraumatic.     Nose: Nose normal.     Mouth/Throat:     Mouth: Mucous membranes are moist.     Pharynx: Oropharynx is clear.  Eyes:     Extraocular Movements: Extraocular movements intact.     Conjunctiva/sclera: Conjunctivae normal.     Pupils: Pupils are equal, round, and reactive to light.  Cardiovascular:     Rate and Rhythm: Normal rate and regular rhythm.  Pulses: Normal pulses.     Heart sounds: Normal heart sounds.  Pulmonary:     Effort: Pulmonary effort is normal.     Breath sounds: Normal breath sounds.  Abdominal:     General: Abdomen is flat. Bowel sounds are normal.     Palpations: Abdomen is soft.  Musculoskeletal:        General: Normal range of motion.     Cervical back: Normal range of motion.       Legs:  Skin:    General: Skin is warm and dry.  Neurological:     General: No focal deficit present.     Mental Status: He is alert and oriented to person, place, and time.  Psychiatric:        Mood and Affect: Mood normal.        Behavior: Behavior normal.        Thought Content: Thought content normal.        Judgment: Judgment normal.      No results found for any visits on 01/29/24.  Recent Results (from the past 2160 hours)  Glucose, capillary     Status: Abnormal   Collection Time: 01/25/24 10:17 AM  Result Value Ref Range   Glucose-Capillary 144 (H) 70 - 99 mg/dL    Comment: Glucose reference range applies only to samples taken after fasting for at least 8 hours.  Hemoglobin A1c     Status: Abnormal   Collection Time: 01/25/24 10:34 AM  Result Value Ref Range   Hgb A1c MFr Bld 5.8 (H) 4.8 - 5.6 %    Comment: (NOTE) Pre diabetes:          5.7%-6.4%  Diabetes:               >6.4%  Glycemic control for   <7.0% adults with diabetes    Mean Plasma Glucose 119.76 mg/dL    Comment: Performed at [facility], [address] [place], [place] [address]  Basic metabolic panel per protocol     Status: Abnormal   Collection Time: 01/25/24 10:34 AM  Result Value Ref Range   Sodium 138 135 - 145 mmol/L   Potassium 4.5 3.5 - 5.1 mmol/L   Chloride 105 98 - 111 mmol/L   CO2 27 22 - 32 mmol/L   Glucose, Bld 135 (H) 70 - 99 mg/dL    Comment: Glucose reference range applies only to samples taken after fasting for at least 8 hours.   BUN 26 (H) 6 - 20 mg/dL   Creatinine, Ser 6.04 0.61 - 1.24 mg/dL   Calcium 9.5 8.9 - 54.0 mg/dL   GFR, Estimated >98 >11 mL/min    Comment: (NOTE) Calculated using the CKD-EPI Creatinine Equation (2021)    Anion gap 6 5 - 15    Comment: Performed at [facility], [address] [place], [place] [address]  CBC per protocol     Status: None   Collection Time: 01/25/24 10:34 AM  Result Value Ref Range   WBC 5.3 4.0 - 10.5 K/uL   RBC 5.16 4.22 - 5.81 MIL/uL   Hemoglobin 15.4 13.0 - 17.0 g/dL   HCT 29.5 62.1 - 30.8 %   MCV 88.4 80.0 - 100.0 fL   MCH 29.8 26.0 - 34.0 pg   MCHC 33.8 30.0 - 36.0 g/dL   RDW 65.7 84.6 - 96.2 %   Platelets 197 150 - 400 K/uL   nRBC 0.0 0.0 - 0.2 %  Comment: Performed at [facility], [address] [place], [place] [address]  Urine Culture     Status: Abnormal   Collection Time: 01/25/24 10:59 AM   Specimen: Urine, Clean Catch  Result Value Ref Range   Specimen Description      URINE, CLEAN CATCH Performed at [facility], [address] [place], [place] [address]    Special Requests      NONE Performed at [facility], [address], [place], [place] [address]    Culture >=100,000 COLONIES/mL STAPHYLOCOCCUS EPIDERMIDIS (A)    Report Status 01/27/2024 FINAL    Organism ID, Bacteria STAPHYLOCOCCUS  EPIDERMIDIS (A)       Susceptibility   Staphylococcus epidermidis - MIC*    CIPROFLOXACIN <=0.5 SENSITIVE Sensitive     GENTAMICIN <=0.5 SENSITIVE Sensitive     NITROFURANTOIN <=16 SENSITIVE Sensitive     OXACILLIN <=0.25 SENSITIVE Sensitive     TETRACYCLINE >=16 RESISTANT Resistant     VANCOMYCIN 1 SENSITIVE Sensitive     TRIMETH/SULFA <=10 SENSITIVE Sensitive     RIFAMPIN <=0.5 SENSITIVE Sensitive     Inducible Clindamycin NEGATIVE Sensitive     * >=100,000 COLONIES/mL STAPHYLOCOCCUS EPIDERMIDIS      Assessment & Plan:  Bactroban for right heal abrasion.  Problem List Items Addressed This Visit       Musculoskeletal and Integument   Skin abrasion - Primary    Return if symptoms worsen or fail to improve.   Total time spent: 25 minutes  [employer], NP  01/29/2024   This document may have been prepared by Dragon Voice Recognition software and as such may include unintentional dictation errors.

## 2024-02-04 MED ORDER — GENTAMICIN SULFATE 40 MG/ML IJ SOLN
5.0000 mg/kg | INTRAVENOUS | Status: AC
  Administered 2024-02-05: 350 mg via INTRAVENOUS
  Filled 2024-02-04: qty 8.75

## 2024-02-04 NOTE — Anesthesia Preprocedure Evaluation (Signed)
 Anesthesia Evaluation  Patient identified by MRN, date of birth, ID band Patient awake    Reviewed: Allergy & Precautions, NPO status , Patient's Chart, lab work & pertinent test results  History of Anesthesia Complications Negative for: history of anesthetic complications  Airway Mallampati: III  TM Distance: >3 FB Neck ROM: Full    Dental  (+) Dental Advisory Given, Teeth Intact   Pulmonary neg pulmonary ROS   Pulmonary exam normal        Cardiovascular hypertension, Pt. on medications + Peripheral Vascular Disease  Normal cardiovascular exam+ Valvular Problems/Murmurs    '24 TTE - Normal left ventricular systolic function. Mild left ventricular hypertrophy with GRADE 1 (relaxation abnormality) diastolic dysfunction. Mild tricuspid regurgitation. Mild mitral regurgitation. Fibrocalcified aortic valve with mild aortic stenosis and mild aortic regurgitation. Moderately dilated left atrium     Neuro/Psych negative neurological ROS  negative psych ROS   GI/Hepatic negative GI ROS, Neg liver ROS,,,  Endo/Other  diabetes, Type 2    Renal/GU negative Renal ROS     Musculoskeletal negative musculoskeletal ROS (+)    Abdominal   Peds  Hematology negative hematology ROS (+)   Anesthesia Other Findings Hx left AKA   Reproductive/Obstetrics                              Anesthesia Physical Anesthesia Plan  ASA: 3  Anesthesia Plan: General   Post-op Pain Management: Tylenol PO (pre-op)*   Induction: Intravenous  PONV Risk Score and Plan: 2 and Treatment may vary due to age or medical condition, Ondansetron, Dexamethasone and Midazolam  Airway Management Planned: LMA  Additional Equipment: None  Intra-op Plan:   Post-operative Plan: Extubation in OR  Informed Consent: I have reviewed the patients History and Physical, chart, labs and discussed the procedure including the risks,  benefits and alternatives for the proposed anesthesia with the patient or authorized representative who has indicated his/her understanding and acceptance.     Dental advisory given  Plan Discussed with: CRNA and Anesthesiologist  Anesthesia Plan Comments:          Anesthesia Quick Evaluation

## 2024-02-05 ENCOUNTER — Ambulatory Visit (HOSPITAL_COMMUNITY): Payer: Medicare Other | Admitting: Medical

## 2024-02-05 ENCOUNTER — Encounter (HOSPITAL_COMMUNITY)
Admission: RE | Disposition: A | Discharge status: Home / Self Care | Payer: Self-pay | Source: Home / Self Care | Attending: Urology

## 2024-02-05 ENCOUNTER — Encounter (HOSPITAL_COMMUNITY): Payer: Self-pay | Admitting: Urology

## 2024-02-05 ENCOUNTER — Ambulatory Visit (HOSPITAL_BASED_OUTPATIENT_CLINIC_OR_DEPARTMENT_OTHER): Payer: Self-pay | Admitting: Anesthesiology

## 2024-02-05 ENCOUNTER — Other Ambulatory Visit: Payer: Self-pay

## 2024-02-05 ENCOUNTER — Ambulatory Visit (HOSPITAL_COMMUNITY)
Admission: RE | Admit: 2024-02-05 | Discharge: 2024-02-05 | Disposition: A | Discharge status: Home / Self Care | Payer: Medicare Other | Attending: Urology | Admitting: Urology

## 2024-02-05 DIAGNOSIS — N529 Male erectile dysfunction, unspecified: Secondary | ICD-10-CM | POA: Diagnosis present

## 2024-02-05 DIAGNOSIS — E119 Type 2 diabetes mellitus without complications: Secondary | ICD-10-CM | POA: Diagnosis not present

## 2024-02-05 DIAGNOSIS — Z79899 Other long term (current) drug therapy: Secondary | ICD-10-CM | POA: Diagnosis not present

## 2024-02-05 DIAGNOSIS — I10 Essential (primary) hypertension: Secondary | ICD-10-CM

## 2024-02-05 DIAGNOSIS — N521 Erectile dysfunction due to diseases classified elsewhere: Secondary | ICD-10-CM

## 2024-02-05 DIAGNOSIS — N486 Induration penis plastica: Secondary | ICD-10-CM | POA: Diagnosis not present

## 2024-02-05 HISTORY — PX: PENILE PROSTHESIS IMPLANT: SHX240

## 2024-02-05 LAB — GLUCOSE, CAPILLARY
Glucose-Capillary: 108 mg/dL — ABNORMAL HIGH (ref 70–99)
Glucose-Capillary: 133 mg/dL — ABNORMAL HIGH (ref 70–99)
Glucose-Capillary: 132 mg/dL — ABNORMAL HIGH (ref 70–99)

## 2024-02-05 SURGERY — INSERTION, PENILE PROSTHESIS
Anesthesia: General | Site: Penis

## 2024-02-05 MED ORDER — OXYCODONE HCL 5 MG PO TABS: 5.0000 mg | ORAL_TABLET | Freq: Four times a day (QID) | ORAL | 0 refills | Status: DC | PRN

## 2024-02-05 MED ORDER — ORAL CARE MOUTH RINSE: 15.0000 mL | Freq: Once | OROMUCOSAL | Status: AC

## 2024-02-05 MED ORDER — VANCOMYCIN HCL IN DEXTROSE 1-5 GM/200ML-% IV SOLN
1000.0000 mg | INTRAVENOUS | Status: AC
  Administered 2024-02-05: 1000 mg via INTRAVENOUS
  Filled 2024-02-05: qty 200

## 2024-02-05 MED ORDER — OXYCODONE HCL 5 MG PO TABS: 5.0000 mg | ORAL_TABLET | Freq: Once | ORAL | Status: AC | PRN

## 2024-02-05 MED ORDER — OXYCODONE HCL 5 MG/5ML PO SOLN
5.0000 mg | Freq: Once | ORAL | Status: AC | PRN
Start: 2024-02-05 — End: 2024-02-05

## 2024-02-05 MED ORDER — FENTANYL CITRATE (PF) 100 MCG/2ML IJ SOLN
INTRAMUSCULAR | Status: AC
  Filled 2024-02-05: qty 2

## 2024-02-05 MED ORDER — MIDAZOLAM HCL 2 MG/2ML IJ SOLN
INTRAMUSCULAR | Status: AC
  Filled 2024-02-05: qty 2

## 2024-02-05 MED ORDER — FENTANYL CITRATE PF 50 MCG/ML IJ SOSY: 25.0000 ug | PREFILLED_SYRINGE | INTRAMUSCULAR | Status: DC | PRN

## 2024-02-05 MED ORDER — LACTATED RINGERS IV SOLN: INTRAVENOUS | Status: DC | PRN

## 2024-02-05 MED ORDER — ACETAMINOPHEN 500 MG PO TABS
1000.0000 mg | ORAL_TABLET | Freq: Once | ORAL | Status: AC
  Administered 2024-02-05: 1000 mg via ORAL
  Filled 2024-02-05: qty 2

## 2024-02-05 MED ORDER — MUPIROCIN 2 % EX OINT
1.0000 | TOPICAL_OINTMENT | Freq: Once | CUTANEOUS | Status: AC
  Administered 2024-02-05: 1 via NASAL
  Filled 2024-02-05: qty 22

## 2024-02-05 MED ORDER — PROPOFOL 10 MG/ML IV BOLUS
INTRAVENOUS | Status: DC | PRN
  Administered 2024-02-05: 200 mg via INTRAVENOUS
  Administered 2024-02-05: 80 mg via INTRAVENOUS

## 2024-02-05 MED ORDER — SULFAMETHOXAZOLE-TRIMETHOPRIM 800-160 MG PO TABS: 1.0000 | ORAL_TABLET | Freq: Two times a day (BID) | ORAL | 0 refills | Status: DC

## 2024-02-05 MED ORDER — VANCOMYCIN HCL IN DEXTROSE 1-5 GM/200ML-% IV SOLN: 1000.0000 mg | INTRAVENOUS | Status: DC

## 2024-02-05 MED ORDER — BUPIVACAINE HCL (PF) 0.5 % IJ SOLN
INTRAMUSCULAR | Status: AC
  Filled 2024-02-05: qty 30

## 2024-02-05 MED ORDER — MIDAZOLAM HCL 5 MG/5ML IJ SOLN
INTRAMUSCULAR | Status: DC | PRN
  Administered 2024-02-05: 2 mg via INTRAVENOUS

## 2024-02-05 MED ORDER — AMISULPRIDE (ANTIEMETIC) 5 MG/2ML IV SOLN: 10.0000 mg | Freq: Once | INTRAVENOUS | Status: DC | PRN

## 2024-02-05 MED ORDER — DEXAMETHASONE SODIUM PHOSPHATE 10 MG/ML IJ SOLN
INTRAMUSCULAR | Status: AC
  Filled 2024-02-05: qty 1

## 2024-02-05 MED ORDER — INSULIN ASPART 100 UNIT/ML IJ SOLN: 0.0000 [IU] | INTRAMUSCULAR | Status: DC | PRN

## 2024-02-05 MED ORDER — ROCURONIUM BROMIDE 100 MG/10ML IV SOLN
INTRAVENOUS | Status: DC | PRN
  Administered 2024-02-05: 30 mg via INTRAVENOUS

## 2024-02-05 MED ORDER — CHLORHEXIDINE GLUCONATE 0.12 % MT SOLN
15.0000 mL | Freq: Once | OROMUCOSAL | Status: AC
  Administered 2024-02-05: 15 mL via OROMUCOSAL

## 2024-02-05 MED ORDER — CHLORHEXIDINE GLUCONATE 4 % EX SOLN: Freq: Once | CUTANEOUS | Status: DC

## 2024-02-05 MED ORDER — VANCOMYCIN HCL 1 G IV SOLR
Freq: Once | INTRAVENOUS | Status: AC
  Administered 2024-02-05: 1000 mL
  Filled 2024-02-05: qty 20

## 2024-02-05 MED ORDER — SUGAMMADEX SODIUM 200 MG/2ML IV SOLN
INTRAVENOUS | Status: DC | PRN
Start: 2024-02-05 — End: 2024-02-05
  Administered 2024-02-05: 200 mg via INTRAVENOUS

## 2024-02-05 MED ORDER — PROPOFOL 10 MG/ML IV BOLUS
INTRAVENOUS | Status: AC
  Filled 2024-02-05: qty 20

## 2024-02-05 MED ORDER — LIDOCAINE HCL 1 % IJ SOLN
INTRAMUSCULAR | Status: AC
  Filled 2024-02-05: qty 20

## 2024-02-05 MED ORDER — SODIUM CHLORIDE 0.9 % IV SOLN: 12.5000 mg | INTRAVENOUS | Status: DC | PRN

## 2024-02-05 MED ORDER — PHENYLEPHRINE HCL (PRESSORS) 10 MG/ML IV SOLN: INTRAVENOUS | Status: DC | PRN

## 2024-02-05 MED ORDER — DEXAMETHASONE SODIUM PHOSPHATE 10 MG/ML IJ SOLN
INTRAMUSCULAR | Status: DC | PRN
  Administered 2024-02-05: 5 mg via INTRAVENOUS

## 2024-02-05 MED ORDER — ACETAMINOPHEN 500 MG PO TABS: 1000.0000 mg | ORAL_TABLET | Freq: Four times a day (QID) | ORAL | 0 refills | Status: DC

## 2024-02-05 MED ORDER — FLUCONAZOLE IN SODIUM CHLORIDE 200-0.9 MG/100ML-% IV SOLN
200.0000 mg | INTRAVENOUS | Status: DC
  Filled 2024-02-05: qty 100

## 2024-02-05 MED ORDER — LIDOCAINE HCL (CARDIAC) PF 100 MG/5ML IV SOSY
PREFILLED_SYRINGE | INTRAVENOUS | Status: DC | PRN
  Administered 2024-02-05: 80 mg via INTRAVENOUS

## 2024-02-05 MED ORDER — PHENYLEPHRINE HCL-NACL 20-0.9 MG/250ML-% IV SOLN
INTRAVENOUS | Status: DC | PRN
  Administered 2024-02-05: 25 ug/min via INTRAVENOUS

## 2024-02-05 MED ORDER — LACTATED RINGERS IV SOLN: INTRAVENOUS | Status: DC

## 2024-02-05 MED ORDER — IRRISEPT - 450ML BOTTLE WITH 0.05% CHG IN STERILE WATER, USP 99.95% OPTIME
TOPICAL | Status: DC | PRN
  Administered 2024-02-05: 900 mL via TOPICAL

## 2024-02-05 MED ORDER — FENTANYL CITRATE (PF) 100 MCG/2ML IJ SOLN
INTRAMUSCULAR | Status: DC | PRN
Start: 2024-02-05 — End: 2024-02-05
  Administered 2024-02-05 (×2): 50 ug via INTRAVENOUS

## 2024-02-05 MED ORDER — PHENYLEPHRINE HCL (PRESSORS) 10 MG/ML IV SOLN
INTRAVENOUS | Status: AC
  Filled 2024-02-05: qty 1

## 2024-02-05 MED ORDER — ONDANSETRON HCL 4 MG/2ML IJ SOLN
INTRAMUSCULAR | Status: AC
  Filled 2024-02-05: qty 2

## 2024-02-05 MED ORDER — PHENYLEPHRINE 80 MCG/ML (10ML) SYRINGE FOR IV PUSH (FOR BLOOD PRESSURE SUPPORT)
PREFILLED_SYRINGE | INTRAVENOUS | Status: DC | PRN
  Administered 2024-02-05 (×4): 80 ug via INTRAVENOUS
  Administered 2024-02-05: 40 ug via INTRAVENOUS

## 2024-02-05 MED ORDER — SODIUM CHLORIDE 0.9 % IR SOLN
Status: DC | PRN
  Administered 2024-02-05: 1000 mL

## 2024-02-05 MED ORDER — ONDANSETRON HCL 4 MG/2ML IJ SOLN
INTRAMUSCULAR | Status: DC | PRN
  Administered 2024-02-05: 4 mg via INTRAVENOUS

## 2024-02-05 MED ORDER — DEXAMETHASONE SODIUM PHOSPHATE 4 MG/ML IJ SOLN: INTRAMUSCULAR | Status: DC | PRN

## 2024-02-05 MED ORDER — OXYCODONE HCL 5 MG PO TABS
ORAL_TABLET | ORAL | Status: AC
  Administered 2024-02-05: 5 mg via ORAL
  Filled 2024-02-05: qty 1

## 2024-02-05 MED ORDER — BUPIVACAINE HCL 0.5 % IJ SOLN
INTRAMUSCULAR | Status: DC | PRN
  Administered 2024-02-05: 10 mL

## 2024-02-05 MED ORDER — CELECOXIB 200 MG PO CAPS: 200.0000 mg | ORAL_CAPSULE | Freq: Two times a day (BID) | ORAL | 1 refills | Status: AC

## 2024-02-05 MED ORDER — LIDOCAINE HCL 1 % IJ SOLN
INTRAMUSCULAR | Status: DC | PRN
  Administered 2024-02-05: 10 mL

## 2024-02-05 MED ORDER — EPHEDRINE SULFATE-NACL 50-0.9 MG/10ML-% IV SOSY
PREFILLED_SYRINGE | INTRAVENOUS | Status: DC | PRN
Start: 2024-02-05 — End: 2024-02-05
  Administered 2024-02-05 (×2): 5 mg via INTRAVENOUS

## 2024-02-05 MED ORDER — STERILE WATER FOR IRRIGATION IR SOLN
Status: DC | PRN
  Administered 2024-02-05: 1000 mL

## 2024-02-05 SURGICAL SUPPLY — 65 items
BAG URINE DRAIN 2000ML AR STRL (UROLOGICAL SUPPLIES) IMPLANT
BLADE SURG 15 STRL LF DISP TIS (BLADE) ×2 IMPLANT
BNDG COHESIVE 1.5X5 TAN NS LF (GAUZE/BANDAGES/DRESSINGS) ×2 IMPLANT
BNDG COHESIVE 2X5 TAN ST LF (GAUZE/BANDAGES/DRESSINGS) ×2 IMPLANT
BNDG GAUZE DERMACEA FLUFF 4 (GAUZE/BANDAGES/DRESSINGS) ×3 IMPLANT
BRIEF MESH DISP LRG (UNDERPADS AND DIAPERS) ×2 IMPLANT
CATH COUDE 5CC RIBBED (CATHETERS) ×2 IMPLANT
CHLORAPREP W/TINT 26 (MISCELLANEOUS) ×4 IMPLANT
CORD BIPOLAR FORCEPS 12FT (ELECTRODE) ×2 IMPLANT
COVER BACK TABLE 60X90IN (DRAPES) ×2 IMPLANT
COVER MAYO STAND STRL (DRAPES) ×2 IMPLANT
COVER SURGICAL LIGHT HANDLE (MISCELLANEOUS) ×2 IMPLANT
DERMABOND ADVANCED .7 DNX12 (GAUZE/BANDAGES/DRESSINGS) ×2 IMPLANT
DRAIN CHANNEL 10F 3/8 F FF (DRAIN) ×1 IMPLANT
DRAIN PENROSE 0.25X18 (DRAIN) ×2 IMPLANT
DRAPE INCISE IOBAN 66X45 STRL (DRAPES) ×2 IMPLANT
DRAPE LAPAROTOMY T 98X78 PEDS (DRAPES) ×2 IMPLANT
DRSG TEGADERM 4X4.75 (GAUZE/BANDAGES/DRESSINGS) ×1 IMPLANT
ELECT REM PT RETURN 15FT ADLT (MISCELLANEOUS) ×2 IMPLANT
EVACUATOR SILICONE 100CC (DRAIN) ×1 IMPLANT
GAUZE 4X4 16PLY ~~LOC~~+RFID DBL (SPONGE) ×2 IMPLANT
GAUZE SPONGE 4X4 12PLY STRL LF (GAUZE/BANDAGES/DRESSINGS) ×1 IMPLANT
GAUZE STRETCH 2X75IN STRL (MISCELLANEOUS) ×1 IMPLANT
GAUZE XEROFORM 1X8 LF (GAUZE/BANDAGES/DRESSINGS) ×1 IMPLANT
GLOVE BIO SURGEON STRL SZ7 (GLOVE) ×2 IMPLANT
GLOVE BIOGEL PI IND STRL 7.0 (GLOVE) ×2 IMPLANT
GOWN STRL REUS W/ TWL LRG LVL3 (GOWN DISPOSABLE) ×4 IMPLANT
GOWN STRL REUS W/ TWL XL LVL3 (GOWN DISPOSABLE) ×2 IMPLANT
HOLDER FOLEY CATH W/STRAP (MISCELLANEOUS) IMPLANT
IMPL SNAPCONE RTE CX 2.0 IMPLANT
IMPLANT RTE SNAPCONE CX 2.0 ×2 IMPLANT
IMPLANT SNAPCONE RTE CX 2.0 ×2 IMPLANT
JET LAVAGE IRRISEPT WOUND (IRRIGATION / IRRIGATOR) ×4 IMPLANT
KIT ACCESSORY AMS 700 PUMP (Urological Implant) ×1 IMPLANT
KIT BASIN OR (CUSTOM PROCEDURE TRAY) ×2 IMPLANT
KIT TURNOVER KIT A (KITS) ×1 IMPLANT
LAVAGE JET IRRISEPT WOUND (IRRIGATION / IRRIGATOR) IMPLANT
NDL HYPO 22X1.5 SAFETY MO (MISCELLANEOUS) ×1 IMPLANT
NEEDLE HYPO 22X1.5 SAFETY MO (MISCELLANEOUS) ×2 IMPLANT
NS IRRIG 1000ML POUR BTL (IV SOLUTION) ×2 IMPLANT
PACK GENERAL/GYN (CUSTOM PROCEDURE TRAY) ×2 IMPLANT
PENCIL SMOKE EVACUATOR (MISCELLANEOUS) ×2 IMPLANT
PLUG CATH AND CAP STRL 200 (CATHETERS) ×2 IMPLANT
PUMP PENILE AMS 700CX MS 12X21 (Erectile Restoration) ×1 IMPLANT
RESERVOIR FLAT IZ 100ML (Miscellaneous) ×1 IMPLANT
RETRACTOR DEEP SCROTAL PENILE (MISCELLANEOUS) ×1 IMPLANT
RETRACTOR WILSON SYSTEM (INSTRUMENTS) IMPLANT
SET COLLECT BLD 21X.75 12 PB G (NEEDLE) ×2 IMPLANT
SUCTION TUBE FRAZIER 12FR DISP (SUCTIONS) ×2 IMPLANT
SURGILUBE 2OZ TUBE FLIPTOP (MISCELLANEOUS) ×1 IMPLANT
SUT CHROMIC 3 0 SH 27 (SUTURE) ×4 IMPLANT
SUT CHROMIC 4 0 SH 27 (SUTURE) ×4 IMPLANT
SUT ETHIBOND 3-0 V-5 (SUTURE) ×6 IMPLANT
SUT ETHILON 3 0 PS 1 (SUTURE) ×1 IMPLANT
SUT MNCRL AB 4-0 PS2 18 (SUTURE) ×2 IMPLANT
SUT VIC AB 2-0 UR6 27 (SUTURE) ×8 IMPLANT
SUT VIC AB 3-0 SH 27X BRD (SUTURE) ×5 IMPLANT
SYR 10ML LL (SYRINGE) ×4 IMPLANT
SYR 20ML LL LF (SYRINGE) ×2 IMPLANT
SYR 50ML LL SCALE MARK (SYRINGE) ×6 IMPLANT
SYR CONTROL 10ML LL (SYRINGE) ×2 IMPLANT
TOWEL GREEN STERILE FF (TOWEL DISPOSABLE) ×2 IMPLANT
TOWEL OR 17X26 10 PK STRL BLUE (TOWEL DISPOSABLE) ×4 IMPLANT
TUBING CONNECTING 10 (TUBING) ×2 IMPLANT
WATER STERILE IRR 500ML POUR (IV SOLUTION) ×2 IMPLANT

## 2024-02-05 NOTE — H&P (Signed)
 H&P  History of Present Illness: Jeremy Padilla is a 58 y.o. year old M who presents today for insertion of an inflatable penile prosthesis  No acute complaints  Past Medical History:  Diagnosis Date   Abscess 10/05/2015   Diabetes mellitus without complication (HCC)    Heart murmur    Herpes exposure    Hyperlipidemia    Hypertension     Past Surgical History:  Procedure Laterality Date   AMPUTATION Left 06/23/2020   Procedure: AMPUTATION ABOVE KNEE;  Surgeon: Annice Needy, MD;  Location: [facility];  Service: General;  Laterality: Left;   LOWER EXTREMITY ANGIOGRAPHY Left 04/09/2019   Procedure: LOWER EXTREMITY ANGIOGRAPHY;  Surgeon: Annice Needy, MD;  Location: [facility];  Service: Cardiovascular;  Laterality: Left;   LOWER EXTREMITY ANGIOGRAPHY Left 04/19/2020   Procedure: Lower Extremity Angiography;  Surgeon: Annice Needy, MD;  Location: [facility];  Service: Cardiovascular;  Laterality: Left;   LOWER EXTREMITY ANGIOGRAPHY Left 04/20/2020   Procedure: Lower Extremity Angiography;  Surgeon: Annice Needy, MD;  Location: [facility];  Service: Cardiovascular;  Laterality: Left;   LOWER EXTREMITY INTERVENTION N/A 05/30/2019   Procedure: LOWER EXTREMITY INTERVENTION;  Surgeon: Renford Dills, MD;  Location: [facility];  Service: Cardiovascular;  Laterality: N/A;   WRIST SURGERY Left     Home Medications:  Current Meds  Medication Sig   acetaminophen (TYLENOL) 500 MG tablet Take 1,000 mg by mouth every 6 (six) hours as needed for moderate pain (pain score 4-6).   aspirin EC 81 MG tablet Take 81 mg by mouth daily.   Bempedoic Acid-Ezetimibe (NEXLIZET) 180-10 MG TABS Take 1 tablet by mouth daily.   Cholecalciferol (VITAMIN D-3) 125 MCG (5000 UT) TABS Take 5,000 Units by mouth daily.   lisinopril (ZESTRIL) 40 MG tablet Take 40 mg by mouth daily.   Magnesium 250 MG TABS Take 250 mg by mouth daily.   Probiotic Product (ALIGN PO) Take 1  tablet by mouth daily.    Allergies:  Allergies  Allergen Reactions   Penicillins Rash    .Has patient had a PCN reaction causing immediate rash, facial/tongue/throat swelling, SOB or lightheadedness with hypotension: Unknown Has patient had a PCN reaction causing severe rash involving mucus membranes or skin necrosis: Unknown Has patient had a PCN reaction that required hospitalization: Unknown Has patient had a PCN reaction occurring within the last 10 years: Unknown If all of the above answers are "NO", then may proceed with Cephalosporin use.  Patient Tolerates Cephalosporins    Statins Other (See Comments)    Family History  Problem Relation Age of Onset   Heart disease Father    Hyperlipidemia Father    Hypertension Father     Social History:  reports that he has never smoked. He has never been exposed to tobacco smoke. He has never used smokeless tobacco. He reports current alcohol use of about 1.0 standard drink of alcohol per week. He reports that he does not use drugs.  ROS: A complete review of systems was performed.  All systems are negative except for pertinent findings as noted.  Physical Exam:  Vital signs in last 24 hours: Temp:  [98 F (36.7 C)] 98 F (36.7 C) (02/25 0612) Pulse Rate:  [69] 69 (02/25 0612) Resp:  [18] 18 (02/25 0612) BP: (163)/(68) 163/68 (02/25 0612) SpO2:  [99 %] 99 % (02/25 0612) Weight:  [68 kg] 68 kg (02/25 0616) Constitutional:  Alert and oriented, No acute distress Cardiovascular: Regular rate and rhythm Respiratory: Normal respiratory effort, Lungs clear bilaterally GI: Abdomen is soft, nontender, nondistended, no abdominal masses Lymphatic: No lymphadenopathy Neurologic: Grossly intact, no focal deficits Psychiatric: Normal mood and affect   Laboratory Data:  No results for input(s): "WBC", "HGB", "HCT", "PLT" in the last 72 hours.  No results for input(s): "NA", "K", "CL", "GLUCOSE", "BUN", "CALCIUM", "CREATININE" in the  last 72 hours.  Invalid input(s): "CO3"   Results for orders placed or performed during the hospital encounter of 02/05/24 (from the past 24 hours)  Glucose, capillary     Status: Abnormal   Collection Time: 02/05/24  6:09 AM  Result Value Ref Range   Glucose-Capillary 108 (H) 70 - 99 mg/dL   No results found for this or any previous visit (from the past 240 hours).  Renal Function: No results for input(s): "CREATININE" in the last 168 hours. Estimated Creatinine Clearance: 75 mL/min (by C-G formula based on SCr of 1.02 mg/dL).  Radiologic Imaging: No results found.  Assessment:  Jeremy Padilla is a 58 y.o. year old M with ED refractory to other medical treatments  Plan:  To OR as planned for IPP. Procedure and risks reviewed, including but not limited to bleeding, infection, implant infection, implant malfunction, implant malplacement, erosion, damage to adjacent structures, pain, urinary retention. All questions answered   Irine Seal, MD 02/05/2024, 7:22 AM  [facility] Pager: [phone]

## 2024-02-05 NOTE — Op Note (Signed)
 PATIENT:  Jeremy Padilla  PRE-OPERATIVE DIAGNOSIS:  Organic erectile dysfunction  POST-OPERATIVE DIAGNOSIS:  Same  PROCEDURE:   3 piece inflatable penile prosthesis (BS/AMS) Injection of pharmacoagent into penis  SURGEON:  Irine Seal MD  ASST: Harrie Foreman PA  Assistant performed critical functions including exposure, retraction, suction, placement of reservoir and cylinders   INDICATION: He has had long-standing organic erectile dysfunction and refractory to other modes of treatment. He has elected to proceed with prosthesis implantation.  ANESTHESIA:  General  EBL:  Minimal  Device: 3 piece AMS CX 700: 99 cc reservoir, 21 cm cylinders and 2 cm rear-tip extenders on right and left sides  LOCAL MEDICATIONS USED:  None  SPECIMEN: None  DISPOSITION OF SPECIMEN:  N/A  Description of procedure: The patient was taken to the major operating room, placed on the table and administered general anesthesia in the supine position. His genitalia was then prepped with chlorhexidine x 2. He was draped in the usual sterile fashion, and I used [place] on the field. An official timeout was then performed.  A dorsal penile block was performed. A butterfly needle was then used to inject normal saline into the penis to give an artificial erection. There was no clinically significant curvature or deformity. I then injected 10 cc of lidocaine/marcaine into the penis.   A 14 French coude catheter was then placed in the bladder and the bladder was drained and the catheter was plugged. A midline penoscrotal incision was then made and the dissection was carried down to the corpora and urethra. The lonestar retractor was positioned so as to have excellent exposure. 2-0 Vicryl sutures were then placed proximally in each corpus cavernosum to serve as stay sutures. An incision was then made in the corpus cavernosum first on the left-hand side with the bovie. Jen Mow were used to gently dilate the opening. I  then dilated the corpus cavernosum with the a 12 Fr brooks dilator distally and proximally. Field goal post tests were performed and there was no evidence of perforations or crossover. I then irrigated the corpus cavernosum with antibiotic solution and measured the distance proximally and distally from the stay suture and was found to be 15 and 8 cm, respectively.I then turned my attention to the contralateral corpus cavernosum and placed my stay sutures, made my corporotomy and dilated the corpus cavernosum in an identical fashion. This was measured and also was found to be 15 cm proximally and 8 distally. It was irrigated with anastomotic solution as was the scrotum. I then chose an 21 cm cylinder set with 2 cm rear-tip extenders and these were prepped while I prepared the site for reservoir placement.  I then digitally probed into the Left external inguinal ring. My finger was used to poke through the posterior wall of the ring. I used my finger to ensure I was in the appropriate space, and to clear room for the reservoir. I irrigated the space with anastomotic solution and then placed the reservoir in this location. I then filled the reservoir with 99 cc of sterile saline, and checked to confirm proper position. There was minimal backpressure with the reservoir max-filled.  Attention was redirected to the corporotomies where the cylinders were then placed by first fixing the suture to the distal aspect of the right cylinder to a straight needle. This was then loaded on the [facility] inserter and passed through the corporotomy and distally. I then advanced the straight needle with the [facility] inserter out through the glans and  this was grasped with a hemostat and pulled through the glans and the suture was secured with a hemostat. I then performed an identical maneuver on the contralateral side. After this was performed I irrigated both corpus cavernosum; there was no evidence of urethral perforation. I inserted  the distal portion of the cylinder through the corporotomies and pulled this to the end of the corpora with the suture. The proximal aspect with the rear-tip extender was then passed through the corporotomy and into the seated position on each side. I then connected reservoir tubing to a syringe filled with sterile saline and inflated the device. I noted a good straight erection with both cylinders equidistant under the glans and no buckling of the cylinders. I therefore deflated the device and closed the corporotomies with used my previously placed stay sutures.   I then grasped the scrotal skin in the midline with a babcock, and used a hemostat to dissect down to the dependent-most portion of the scrotum. The nasal speculum was inserted into this space, and facilitated placement of the pump. The cylinder was then connected to the pump after excising the excess tubing with appropriate shodded hemostats in place and then I used the supplied connectors to make the connection. I then again cycled the device with the pump and it cycled properly. I deflated the device and pumped it up about three quarters of the way to aid with hemostasis. I irrigated the wound one last time with antibiotic irrigation and then closed the deep scrotal tissue over the tubing and pump with running 3-0 monocryl suture. I placed a 10 Fr blake drain over the corporotomies. A second layer was then closed over this first layer with running 3- 0 monocryl, and running skin suture w/ 4-0 monocryl performed. Incision dressed with dermabond.  A mummy wrap was applied. The catheter was removed, and drain connected to suction bulb and the patient was awakened and taken recovery room in stable and satisfactory condition. He tolerated the procedure well and there were no intraoperative complications. Needle sponge and instrument counts were correct at the end of the operation.

## 2024-02-05 NOTE — Anesthesia Postprocedure Evaluation (Signed)
 Anesthesia Post Note  Patient: Jeremy Padilla  Procedure(s) Performed: INSERTION OF INFLATABLE PENILE PROSTHESIS (Penis)     Patient location during evaluation: PACU Anesthesia Type: General Level of consciousness: awake and alert Pain management: pain level controlled Vital Signs Assessment: post-procedure vital signs reviewed and stable Respiratory status: spontaneous breathing, nonlabored ventilation and respiratory function stable Cardiovascular status: stable and blood pressure returned to baseline Anesthetic complications: no   No notable events documented.  Last Vitals:  Vitals:   02/05/24 1130 02/05/24 1145  BP: (!) 116/46 (!) 132/56  Pulse: 63 69  Resp:    Temp:    SpO2: 100% 99%    Last Pain:  Vitals:   02/05/24 1145  TempSrc:   PainSc: 3                  Beryle Lathe

## 2024-02-05 NOTE — Transfer of Care (Signed)
 Immediate Anesthesia Transfer of Care Note  Patient: Jeremy Padilla  Procedure(s) Performed: INSERTION OF INFLATABLE PENILE PROSTHESIS (Penis)  Patient Location: PACU  Anesthesia Type:General  Level of Consciousness: awake and alert   Airway & Oxygen Therapy: Patient Spontanous Breathing and Patient connected to nasal cannula oxygen  Post-op Assessment: Report given to RN and Post -op Vital signs reviewed and stable  Post vital signs: Reviewed and stable  Last Vitals:  Vitals Value Taken Time  BP 129/63 02/05/24 0938  Temp 36.4 C 02/05/24 0938  Pulse 85 02/05/24 0945  Resp 11 02/05/24 0945  SpO2 98 % 02/05/24 0945  Vitals shown include unfiled device data.  Last Pain:  Vitals:   02/05/24 0612  TempSrc: Oral         Complications: No notable events documented.

## 2024-02-05 NOTE — Anesthesia Procedure Notes (Signed)
 Procedure Name: Intubation Date/Time: 02/05/2024 7:54 AM  Performed by: Deri Fuelling, CRNAPre-anesthesia Checklist: Patient identified, Emergency Drugs available, Suction available and Patient being monitored Patient Re-evaluated:Patient Re-evaluated prior to induction Oxygen Delivery Method: Circle system utilized Preoxygenation: Pre-oxygenation with 100% oxygen Induction Type: IV induction Ventilation: Mask ventilation without difficulty Laryngoscope Size: Mac and 4 Grade View: Grade I Tube type: Oral Tube size: 7.5 mm Number of attempts: 1 Airway Equipment and Method: Stylet and Oral airway Placement Confirmation: ETT inserted through vocal cords under direct vision, positive ETCO2 and breath sounds checked- equal and bilateral Secured at: 22 cm Tube secured with: Tape Dental Injury: Teeth and Oropharynx as per pre-operative assessment

## 2024-02-05 NOTE — Discharge Instructions (Signed)

## 2024-02-06 ENCOUNTER — Encounter (HOSPITAL_COMMUNITY): Payer: Self-pay | Admitting: Urology

## 2024-03-12 ENCOUNTER — Encounter: Payer: Self-pay | Admitting: Dermatology

## 2024-03-12 ENCOUNTER — Ambulatory Visit (INDEPENDENT_AMBULATORY_CARE_PROVIDER_SITE_OTHER): Payer: Disability Insurance | Admitting: Dermatology

## 2024-03-12 DIAGNOSIS — W908XXA Exposure to other nonionizing radiation, initial encounter: Secondary | ICD-10-CM

## 2024-03-12 DIAGNOSIS — D692 Other nonthrombocytopenic purpura: Secondary | ICD-10-CM

## 2024-03-12 DIAGNOSIS — L820 Inflamed seborrheic keratosis: Secondary | ICD-10-CM | POA: Diagnosis not present

## 2024-03-12 DIAGNOSIS — L570 Actinic keratosis: Secondary | ICD-10-CM | POA: Diagnosis not present

## 2024-03-12 DIAGNOSIS — Z1283 Encounter for screening for malignant neoplasm of skin: Secondary | ICD-10-CM

## 2024-03-12 DIAGNOSIS — L821 Other seborrheic keratosis: Secondary | ICD-10-CM

## 2024-03-12 DIAGNOSIS — L814 Other melanin hyperpigmentation: Secondary | ICD-10-CM

## 2024-03-12 DIAGNOSIS — L97319 Non-pressure chronic ulcer of right ankle with unspecified severity: Secondary | ICD-10-CM

## 2024-03-12 DIAGNOSIS — L578 Other skin changes due to chronic exposure to nonionizing radiation: Secondary | ICD-10-CM

## 2024-03-12 DIAGNOSIS — S91011A Laceration without foreign body, right ankle, initial encounter: Secondary | ICD-10-CM

## 2024-03-12 DIAGNOSIS — S81811A Laceration without foreign body, right lower leg, initial encounter: Secondary | ICD-10-CM

## 2024-03-12 DIAGNOSIS — D1801 Hemangioma of skin and subcutaneous tissue: Secondary | ICD-10-CM

## 2024-03-12 DIAGNOSIS — D229 Melanocytic nevi, unspecified: Secondary | ICD-10-CM

## 2024-03-12 MED ORDER — MUPIROCIN 2 % EX OINT: 1.0000 | TOPICAL_OINTMENT | Freq: Every day | CUTANEOUS | 2 refills | Status: DC

## 2024-03-12 MED ORDER — DOXYCYCLINE MONOHYDRATE 100 MG PO CAPS: 100.0000 mg | ORAL_CAPSULE | Freq: Two times a day (BID) | ORAL | 0 refills | Status: AC

## 2024-03-12 NOTE — Progress Notes (Signed)
 New Patient Visit   Subjective  Jeremy Padilla is a 58 y.o. male who presents for the following: Skin Cancer Screening and Upper Body Skin Exam, no hx of skin cancer, no fhx of skin cancer, check wound R calf ~22m, used Mupirocin oint and ran out  The patient presents for Upper Body Skin Exam (UBSE) for skin cancer screening and mole check. The patient has spots, moles and lesions to be evaluated, some may be new or changing and the patient may have concern these could be cancer.  New patient referral from [employer] NP.  The following portions of the chart were reviewed this encounter and updated as appropriate: medications, allergies, medical history  Review of Systems:  No other skin or systemic complaints except as noted in HPI or Assessment and Plan.  Objective  Well appearing patient in no apparent distress; mood and affect are within normal limits.  All skin waist up  and right lower leg examined. Relevant physical exam findings are noted in the Assessment and Plan.  R temple lat brow x 3 (3) Pink scaly macules mid to upper back x 2, L scapula x 2, L temple x 1 (5) Stuck on waxy paps with erythema   Back      Assessment & Plan   AK (ACTINIC KERATOSIS) (3) R temple lat brow x 3 (3) Actinic keratoses are precancerous spots that appear secondary to cumulative UV radiation exposure/sun exposure over time. They are chronic with expected duration over 1 year. A portion of actinic keratoses will progress to squamous cell carcinoma of the skin. It is not possible to reliably predict which spots will progress to skin cancer and so treatment is recommended to prevent development of skin cancer.  Recommend daily broad spectrum sunscreen SPF 30+ to sun-exposed areas, reapply every 2 hours as needed.  Recommend staying in the shade or wearing long sleeves, sun glasses (UVA+UVB protection) and wide brim hats (4-inch brim around the entire circumference of the hat). Call  for new or changing lesions. Destruction of lesion - R temple lat brow x 3 (3) Complexity: simple   Destruction method: cryotherapy   Informed consent: discussed and consent obtained   Timeout:  patient name, date of birth, surgical site, and procedure verified Lesion destroyed using liquid nitrogen: Yes   Region frozen until ice ball extended beyond lesion: Yes   Outcome: patient tolerated procedure well with no complications   Post-procedure details: wound care instructions given   INFLAMED SEBORRHEIC KERATOSIS (5) mid to upper back x 2, L scapula x 2, L temple x 1 (5) Symptomatic, irritating, patient would like treated. Destruction of lesion - mid to upper back x 2, L scapula x 2, L temple x 1 (5) Complexity: simple   Destruction method: cryotherapy   Informed consent: discussed and consent obtained   Timeout:  patient name, date of birth, surgical site, and procedure verified Lesion destroyed using liquid nitrogen: Yes   Region frozen until ice ball extended beyond lesion: Yes   Outcome: patient tolerated procedure well with no complications   Post-procedure details: wound care instructions given   ACTINIC SKIN DAMAGE   SEBORRHEIC KERATOSIS   SKIN CANCER SCREENING   LENTIGO   MELANOCYTIC NEVUS, UNSPECIFIED LOCATION   PURPURA (HCC)   LACERATION OF RIGHT LEG EXCLUDING THIGH, INITIAL ENCOUNTER     Skin cancer screening performed today.   Actinic Damage - Chronic condition, secondary to cumulative UV/sun exposure - diffuse scaly erythematous macules with underlying  dyspigmentation - Recommend daily broad spectrum sunscreen SPF 30+ to sun-exposed areas, reapply every 2 hours as needed.  - Staying in the shade or wearing long sleeves, sun glasses (UVA+UVB protection) and wide brim hats (4-inch brim around the entire circumference of the hat) are also recommended for sun protection.  - Call for new or changing lesions.  Lentigines, Seborrheic Keratoses, Hemangiomas  - Benign normal skin lesions - Benign-appearing - Call for any changes  Melanocytic Nevi - Tan-brown and/or pink-flesh-colored symmetric macules and papules - Benign appearing on exam today - Observation - Call clinic for new or changing moles - Recommend daily use of broad spectrum spf 30+ sunscreen to sun-exposed areas.   Traumatic laceration of right posterior ankle with ULCER Secondary to trauma R post ankle Exam: laceration ulcer R post ankle Treatment Plan: Start Puracyn spray qd to clean wound Restart Mupirocin oint qd to wound followed by bandage Mechanical debridement today, cleaned with Puracyn followed by Mupirocin oint and bandage  If redness start spreading out from the wound, or red streaks emanating from the wound or if it looks like it is draining pus and it appears infected then : start Doxycycline 100mg  1 po bid with food and drink x 10 days (discussed my start now or if area worsens)  Doxycycline should be taken with food to prevent nausea. Do not lay down for 30 minutes after taking. Be cautious with sun exposure and use good sun protection while on this medication. Pregnant women should not take this medication.    Purpura - Chronic; persistent and recurrent.  Treatable, but not curable. - Violaceous macules and patches - Benign - Related to trauma, age, sun damage and/or use of blood thinners, chronic use of topical and/or oral steroids - Observe - Can use OTC arnica containing moisturizer such as Dermend Bruise Formula if desired - Call for worsening or other concerns   Return for 3-93m AK f/u, ISK f/u.  I, Ardis Rowan, RMA, am acting as scribe for Armida Sans, MD .   Documentation: I have reviewed the above documentation for accuracy and completeness, and I agree with the above.  Armida Sans, MD

## 2024-03-12 NOTE — Patient Instructions (Addendum)
 For wound on right ankle Start Puracyn spray once daily to clean wound After spraying with Puracyn apply Mupirocin ointment and a bandage  If you feel like wound is worsening start Doxycycline 100mg  1 pill twice a day with food and drink for 10 days    Due to recent changes in healthcare laws, you may see results of your pathology and/or laboratory studies on MyChart before the doctors have had a chance to review them. We understand that in some cases there may be results that are confusing or concerning to you. Please understand that not all results are received at the same time and often the doctors may need to interpret multiple results in order to provide you with the best plan of care or course of treatment. Therefore, we ask that you please give [place] 2 business days to thoroughly review all your results before contacting the office for clarification. Should we see a critical lab result, you will be contacted sooner.   If You Need Anything After Your Visit  If you have any questions or concerns for your doctor, please call our main line at [phone] and press option 4 to reach your doctor's medical assistant. If no one answers, please leave a voicemail as directed and we will return your call as soon as possible. Messages left after 4 pm will be answered the following business day.   You may also send [place] a message via MyChart. We typically respond to MyChart messages within 1-2 business days.  For prescription refills, please ask your pharmacy to contact our office. Our fax number is [phone].  If you have an urgent issue when the clinic is closed that cannot wait until the next business day, you can page your doctor at the number below.    Please note that while we do our best to be available for urgent issues outside of office hours, we are not available 24/7.   If you have an urgent issue and are unable to reach [place], you may choose to seek medical care at your doctor's office,  retail clinic, urgent care center, or emergency room.  If you have a medical emergency, please immediately call 911 or go to the emergency department.  Pager Numbers  - Dr. Gwen Pounds: [phone]  - Dr. Roseanne Reno: [phone]  - Dr. Katrinka Blazing: [phone]   In the event of inclement weather, please call our main line at [phone] for an update on the status of any delays or closures.  Dermatology Medication Tips: Please keep the boxes that topical medications come in in order to help keep track of the instructions about where and how to use these. Pharmacies typically print the medication instructions only on the boxes and not directly on the medication tubes.   If your medication is too expensive, please contact our office at [phone] option 4 or send [place] a message through MyChart.   We are unable to tell what your co-pay for medications will be in advance as this is different depending on your insurance coverage. However, we may be able to find a substitute medication at lower cost or fill out paperwork to get insurance to cover a needed medication.   If a prior authorization is required to get your medication covered by your insurance company, please allow [place] 1-2 business days to complete this process.  Drug prices often vary depending on where the prescription is filled and some pharmacies may offer cheaper prices.  The website www.goodrx.com contains coupons for medications through different pharmacies.  The prices here do not account for what the cost may be with help from insurance (it may be cheaper with your insurance), but the website can give you the price if you did not use any insurance.  - You can print the associated coupon and take it with your prescription to the pharmacy.  - You may also stop by our office during regular business hours and pick up a GoodRx coupon card.  - If you need your prescription sent electronically to a different pharmacy, notify our office  through [facility] or by phone at [phone] option 4.     Si Usted Necesita Algo Despus de Su Visita  Tambin puede enviarnos un mensaje a travs de Clinical cytogeneticist. Por lo general respondemos a los mensajes de MyChart en el transcurso de 1 a 2 das hbiles.  Para renovar recetas, por favor pida a su farmacia que se ponga en contacto con nuestra oficina. Annie Sable de fax es [place] [phone].  Si tiene un asunto urgente cuando la clnica est cerrada y que no puede esperar hasta el siguiente da hbil, puede llamar/localizar a su doctor(a) al nmero que aparece a continuacin.   Por favor, tenga en cuenta que aunque hacemos todo lo posible para estar disponibles para asuntos urgentes fuera del horario de [place], no estamos disponibles las 24 horas del da, los 7 [address]  [address] de la [place].   Si tiene un problema urgente y no puede comunicarse con nosotros, puede optar por buscar atencin mdica  en el consultorio de su doctor(a), en una clnica privada, en un centro de atencin urgente o en una sala de emergencias.  Si tiene Engineer, drilling, por favor llame inmediatamente al 911 o vaya a la sala de emergencias.  Nmeros de bper  - Dr. Gwen Pounds: [phone]  - Dra. Roseanne Reno: [phone]  - Dr. Katrinka Blazing: [phone]   En caso de inclemencias del tiempo, por favor llame a Lacy Duverney principal al [phone] para una actualizacin sobre el [place] de cualquier retraso o cierre.  Consejos para la medicacin en dermatologa: Por favor, guarde las cajas en las que vienen los medicamentos de uso tpico para ayudarle a seguir las instrucciones sobre dnde y cmo usarlos. Las farmacias generalmente imprimen las instrucciones del medicamento slo en las cajas y no directamente en los tubos del [place].   Si su medicamento es muy caro, por favor, pngase en contacto con Rolm Gala llamando al [phone] y presione la opcin 4 o envenos un mensaje a travs de Clinical cytogeneticist.   No  podemos decirle cul ser su copago por los medicamentos por adelantado ya que esto es diferente dependiendo de la cobertura de su seguro. Sin embargo, es posible que podamos encontrar un medicamento sustituto a Audiological scientist un formulario para que el seguro cubra el medicamento que se considera necesario.   Si se requiere una autorizacin previa para que su compaa de seguros [place] su medicamento, por favor permtanos de 1 a 2 das hbiles para completar [address]  Los precios de los medicamentos varan con frecuencia dependiendo del Environmental consultant de dnde se surte la receta y alguna farmacias pueden ofrecer precios ms baratos.  El sitio web www.goodrx.com tiene cupones para medicamentos de Health and safety inspector. Los precios aqu no tienen en cuenta lo que podra costar con la ayuda del seguro (puede ser ms barato con su seguro), pero el sitio web puede darle el precio si no utiliz Tourist information centre manager.  - Puede imprimir el cupn correspondiente y llevarlo con su  receta a la farmacia.  - Tambin puede pasar por nuestra oficina durante el horario de atencin regular y Education officer, museum una tarjeta de cupones de GoodRx.  - Si necesita que su receta se enve electrnicamente a una farmacia diferente, informe a nuestra oficina a travs de MyChart de [place] o por telfono llamando al [phone] y presione la opcin 4.

## 2024-03-13 ENCOUNTER — Other Ambulatory Visit: Payer: Self-pay

## 2024-03-13 ENCOUNTER — Ambulatory Visit: Admitting: Cardiology

## 2024-03-13 ENCOUNTER — Emergency Department (HOSPITAL_COMMUNITY)
Admission: EM | Admit: 2024-03-13 | Discharge: 2024-03-13 | Disposition: A | Discharge status: Home / Self Care | Attending: Emergency Medicine | Admitting: Emergency Medicine

## 2024-03-13 DIAGNOSIS — Z89612 Acquired absence of left leg above knee: Secondary | ICD-10-CM | POA: Diagnosis not present

## 2024-03-13 DIAGNOSIS — Z7982 Long term (current) use of aspirin: Secondary | ICD-10-CM | POA: Insufficient documentation

## 2024-03-13 DIAGNOSIS — M79662 Pain in left lower leg: Secondary | ICD-10-CM | POA: Diagnosis present

## 2024-03-13 DIAGNOSIS — L905 Scar conditions and fibrosis of skin: Secondary | ICD-10-CM | POA: Insufficient documentation

## 2024-03-13 DIAGNOSIS — M79605 Pain in left leg: Secondary | ICD-10-CM

## 2024-03-13 MED ORDER — OXYCODONE-ACETAMINOPHEN 5-325 MG PO TABS
1.0000 | ORAL_TABLET | Freq: Four times a day (QID) | ORAL | 0 refills | Status: AC | PRN
Start: 2024-03-13 — End: 2024-03-18

## 2024-03-13 MED ORDER — OXYCODONE-ACETAMINOPHEN 5-325 MG PO TABS: 1.0000 | ORAL_TABLET | Freq: Once | ORAL | Status: DC

## 2024-03-13 MED ORDER — KETOROLAC TROMETHAMINE 15 MG/ML IJ SOLN
15.0000 mg | Freq: Once | INTRAMUSCULAR | Status: AC
  Administered 2024-03-13: 15 mg via INTRAMUSCULAR
  Filled 2024-03-13: qty 1

## 2024-03-13 NOTE — ED Triage Notes (Signed)
 Patient reports pain at left upper leg stump onset yesterday denies injury.

## 2024-03-13 NOTE — ED Provider Notes (Signed)
 [place] EMERGENCY DEPARTMENT AT [facility] Provider Note   CSN: [id number] Arrival date & time: 03/13/24  0550     History  Chief Complaint  Patient presents with   Left Stump (leg) Pain     Jeremy Padilla is a 58 y.o. male with past medical history of heart murmur, PAD, BKA presents to emergency department for evaluation of left upper leg pain that started at 2300 last night.  Pain is intermittent and described as throbbing, sharp sensations causing difficulty with sleep.  He endorses that he typically has pain secondary to phantom pain but that is usually resolved with gabapentin and Tylenol. No improvement following home pain medications so sought ED evaluation. No recent trauma, fevers, skin changes to LLE  HPI     Home Medications Prior to Admission medications   Medication Sig Start Date End Date Taking? Authorizing Provider  oxyCODONE-acetaminophen (PERCOCET/ROXICET) 5-325 MG tablet Take 1 tablet by mouth every 6 (six) hours as needed for up to 5 days for severe pain (pain score 7-10). 03/13/24 03/18/24 Yes Judithann Sheen, PA  acetaminophen (TYLENOL) 500 MG tablet Take 1,000 mg by mouth every 6 (six) hours as needed for moderate pain (pain score 4-6).    [provider]  acetaminophen (TYLENOL) 500 MG tablet Take 2 tablets (1,000 mg total) by mouth every 6 (six) hours. 02/05/24   Despina Arias, MD  aspirin EC 81 MG tablet Take 81 mg by mouth daily.    [provider]  Bempedoic Acid-Ezetimibe (NEXLIZET) 180-10 MG TABS Take 1 tablet by mouth daily. 09/28/23   Laurier Nancy, MD  Cholecalciferol (VITAMIN D-3) 125 MCG (5000 UT) TABS Take 5,000 Units by mouth daily.    [provider]  doxycycline (MONODOX) 100 MG capsule Take 1 capsule (100 mg total) by mouth 2 (two) times daily for 10 days. Take with food and drink 03/12/24 03/22/24  Deirdre Evener, MD  lisinopril (ZESTRIL) 40 MG tablet Take 40 mg by mouth daily.    [provider]  Magnesium 250 MG TABS Take 250 mg by mouth daily.    [provider]  mupirocin cream (BACTROBAN) 2 % Apply 1 Application topically 2 (two) times daily. 01/29/24   Scoggins, Amber, NP  mupirocin ointment (BACTROBAN) 2 % Apply 1 Application topically daily. qd to wound on right ankle until healed 03/12/24   Deirdre Evener, MD  oxyCODONE (ROXICODONE) 5 MG immediate release tablet Take 1 tablet (5 mg total) by mouth every 6 (six) hours as needed. 02/05/24   Despina Arias, MD  Probiotic Product (ALIGN PO) Take 1 tablet by mouth daily.    [provider]  sulfamethoxazole-trimethoprim (BACTRIM DS) 800-160 MG tablet Take 1 tablet by mouth 2 (two) times daily. 02/05/24   Despina Arias, MD      Allergies    Penicillins and Statins    Review of Systems   Review of Systems  Constitutional:  Negative for chills, fatigue and fever.  Respiratory:  Negative for cough, chest tightness, shortness of breath and wheezing.   Cardiovascular:  Negative for chest pain and palpitations.  Gastrointestinal:  Negative for abdominal pain, constipation, diarrhea, nausea and vomiting.  Neurological:  Negative for dizziness, seizures, weakness, light-headedness, numbness and headaches.    Physical Exam Updated Vital Signs BP (!) 146/78   Pulse 80   Temp 97.9 F (36.6 C) (Oral)   Resp 16   SpO2 100%  Physical Exam Vitals and  nursing note reviewed.  Constitutional:      General: He is not in acute distress.    Appearance: Normal appearance.  HENT:     Head: Normocephalic and atraumatic.  Eyes:     Conjunctiva/sclera: Conjunctivae normal.  Cardiovascular:     Rate and Rhythm: Normal rate.     Pulses:          Femoral pulses are 2+ on the left side.      Dorsalis pedis pulses are 2+ on the right side.  Pulmonary:     Effort: Pulmonary effort is normal. No respiratory distress.  Musculoskeletal:     Comments: Old scar that appears well healed. No discoloration,  erythema, swelling, nor warmth to extremity. Not TTP. Sensation 2/2 of LLE     Left Lower Extremity: Left leg is amputated above knee.  Skin:    Coloration: Skin is not jaundiced or pale.  Neurological:     Mental Status: He is alert. Mental status is at baseline.     ED Results / Procedures / Treatments   Labs (all labs ordered are listed, but only abnormal results are displayed) Labs Reviewed - No data to display  EKG None  Radiology No results found.  Procedures Procedures    Medications Ordered in ED Medications  ketorolac (TORADOL) 15 MG/ML injection 15 mg (15 mg Intramuscular Given 03/13/24 4098)    ED Course/ Medical Decision Making/ A&P                                 Medical Decision Making Risk Prescription drug management.   Patient presents to the ED for concern of LLE pain, this involves an extensive number of treatment options, and is a complaint that carries with it a high risk of complications and morbidity.  The differential diagnosis includes DVT, neuropathy, phantom pain, cellulitis, ischemic limb   Co morbidities that complicate the patient evaluation  AKA   Additional history obtained:  Additional history obtained from Nursing   External records from outside source obtained and reviewed including triage RN note    Medicines ordered and prescription drug management:  I ordered medication including toradol and percocet  for pain  Reevaluation of the patient after these medicines showed that the patient improved I have reviewed the patients home medicines and have made adjustments as needed   Test Considered:  [place], XR     Problem List / ED Course:  LLE pain Considered DVT however amputation is above knee with larger vessels which should be less susceptible to clots. No RF such as recent travel, immobilization, surgery, tachycardia, SOB, CP. LLE is not discolored nor TTP Noninfectious appearing. Well perfused limp with normal  sensation. LLE Femoral artery 2+ Doesn't appear to have any new trauma or break in skin integrity from prosthetic leg. Will be following up with prosthetics soon to have new prosthetic made as this one is too big for him now Likely 2/2 peripheral neuropathy but will have pt f/u with PCP Provided toradol in ED and prescription for percocet for pain as he is driving home Discussed not taking his prescribed gabapentin and percocet together as it can be sedating and cause resp depression.    Reevaluation:  After the interventions noted above, I reevaluated the patient and found that they have :improved   Social Determinants of Health:  Has PCP   Dispostion:  After consideration of the diagnostic results and the  patients response to treatment, I feel that the patent would benefit from outpatient management with pain medicine.   Discussed ED workup, disposition, return to ED precautions with patient who expresses understanding agrees with plan.  All questions answered to their satisfaction.  They are agreeable to plan.  Discharge instructions provided on paperwork  Discussed patient with Dr. Wilkie Aye who agrees with plan  Final Clinical Impression(s) / ED Diagnoses Final diagnoses:  Pain of left lower extremity    Rx / DC Orders ED Discharge Orders          Ordered    oxyCODONE-acetaminophen (PERCOCET/ROXICET) 5-325 MG tablet  Every 6 hours PRN        03/13/24 0737              Judithann Sheen, PA 03/13/24 2214    Shon Baton, MD 03/14/24 [phone]

## 2024-03-13 NOTE — Discharge Instructions (Addendum)
 Thank you for letting us take care of you today. I have sent some percocet to your pharmacy to take for pain. Do not take tylenol with this. You can take your gabapentin but not at same time. Space 3-4hrs in between  No driving with percocet

## 2024-04-18 ENCOUNTER — Encounter: Payer: Self-pay | Admitting: Cardiology

## 2024-04-18 ENCOUNTER — Ambulatory Visit (INDEPENDENT_AMBULATORY_CARE_PROVIDER_SITE_OTHER): Admitting: Cardiology

## 2024-04-18 VITALS — BP 140/80 | HR 85 | Ht 74.0 in | Wt 149.4 lb

## 2024-04-18 DIAGNOSIS — I739 Peripheral vascular disease, unspecified: Secondary | ICD-10-CM | POA: Diagnosis not present

## 2024-04-18 DIAGNOSIS — R7303 Prediabetes: Secondary | ICD-10-CM

## 2024-04-18 DIAGNOSIS — Z89612 Acquired absence of left leg above knee: Secondary | ICD-10-CM | POA: Diagnosis not present

## 2024-04-18 MED ORDER — LISINOPRIL 40 MG PO TABS
40.0000 mg | ORAL_TABLET | Freq: Every day | ORAL | 1 refills | Status: AC
Start: 2024-04-18 — End: ?

## 2024-04-18 NOTE — Progress Notes (Signed)
 Established Patient Office Visit  Subjective:  Patient ID: Jeremy Padilla, male    DOB: 1966-12-08  Age: 58 y.o. MRN: [id number]  Chief Complaint  Patient presents with   Follow-up    Discuss referral    Patient in office for an acute visit, requesting a referral. Patient needing a new referral for physical therapy. Patient has had a left leg AK amputation. Patient has participated in physical therapy previously, was discontinued due to hypotension, per patient.     No other concerns at this time.   Past Medical History:  Diagnosis Date   Abscess 10/05/2015   Diabetes mellitus without complication (HCC)    Heart murmur    Herpes exposure    Hyperlipidemia    Hypertension     Past Surgical History:  Procedure Laterality Date   AMPUTATION Left 06/23/2020   Procedure: AMPUTATION ABOVE KNEE;  Surgeon: Celso College, MD;  Location: [facility];  Service: General;  Laterality: Left;   LOWER EXTREMITY ANGIOGRAPHY Left 04/09/2019   Procedure: LOWER EXTREMITY ANGIOGRAPHY;  Surgeon: Celso College, MD;  Location: [facility];  Service: Cardiovascular;  Laterality: Left;   LOWER EXTREMITY ANGIOGRAPHY Left 04/19/2020   Procedure: Lower Extremity Angiography;  Surgeon: Celso College, MD;  Location: [facility];  Service: Cardiovascular;  Laterality: Left;   LOWER EXTREMITY ANGIOGRAPHY Left 04/20/2020   Procedure: Lower Extremity Angiography;  Surgeon: Celso College, MD;  Location: [facility];  Service: Cardiovascular;  Laterality: Left;   LOWER EXTREMITY INTERVENTION N/A 05/30/2019   Procedure: LOWER EXTREMITY INTERVENTION;  Surgeon: Jackquelyn Mass, MD;  Location: [facility];  Service: Cardiovascular;  Laterality: N/A;   PENILE PROSTHESIS IMPLANT N/A 02/05/2024   Procedure: INSERTION OF INFLATABLE PENILE PROSTHESIS;  Surgeon: Mallie Seal, MD;  Location: [facility];  Service: Urology;  Laterality: N/A;  135 MINUTES NEEDED FOR CASE   WRIST SURGERY Left      Social History   Socioeconomic History   Marital status: Widowed    Spouse name: Not on file   Number of children: Not on file   Years of education: Not on file   Highest education level: Not on file  Occupational History   Occupation: applied for disability  Tobacco Use   Smoking status: Never    Passive exposure: Never   Smokeless tobacco: Never  Vaping Use   Vaping status: Never Used  Substance and Sexual Activity   Alcohol use: Yes    Alcohol/week: 1.0 standard drink of alcohol    Types: 1 Glasses of wine per week    Comment: 1 occasionally   Drug use: No   Sexual activity: Yes  Other Topics Concern   Not on file  Social History Narrative   Wife just passed away 04-25-2009   Social Drivers of Health   Financial Resource Strain: Low Risk  (06/01/2020)   Received from [facility], [facility]   Overall Financial Resource Strain (CARDIA)    Difficulty of Paying Living Expenses: Not hard at all  Food Insecurity: No Food Insecurity (06/01/2020)   Received from [facility], [facility]   Hunger Vital Sign    Worried About Running Out of Food in the Last Year: Never true    Ran Out of Food in the Last Year: Never true  Transportation Needs: No Transportation Needs (06/01/2020)   Received from [facility], [facility]   [facility] - Transportation  Lack of Transportation (Medical): No    Lack of Transportation (Non-Medical): No  Physical Activity: Not on file  Stress: Not on file  Social Connections: Not on file  Intimate Partner Violence: Not on file    Family History  Problem Relation Age of Onset   Heart disease Father    Hyperlipidemia Father    Hypertension Father     Allergies  Allergen Reactions   Penicillins Rash    .Has patient had a PCN reaction causing immediate rash, facial/tongue/throat swelling, SOB or lightheadedness with hypotension: Unknown Has patient had a PCN reaction causing severe rash involving mucus membranes or  skin necrosis: Unknown Has patient had a PCN reaction that required hospitalization: Unknown Has patient had a PCN reaction occurring within the last 10 years: Unknown If all of the above answers are "NO", then may proceed with Cephalosporin use.  Patient Tolerates Cephalosporins    Statins Other (See Comments)    Outpatient Medications Prior to Visit  Medication Sig   aspirin  EC 81 MG tablet Take 81 mg by mouth daily.   Cholecalciferol (VITAMIN D-3) 125 MCG (5000 UT) TABS Take 5,000 Units by mouth daily.   Magnesium  250 MG TABS Take 250 mg by mouth daily.   [DISCONTINUED] lisinopril  (ZESTRIL ) 40 MG tablet Take 40 mg by mouth daily.   [DISCONTINUED] acetaminophen  (TYLENOL ) 500 MG tablet Take 1,000 mg by mouth every 6 (six) hours as needed for moderate pain (pain score 4-6). (Patient not taking: Reported on 04/18/2024)   [DISCONTINUED] acetaminophen  (TYLENOL ) 500 MG tablet Take 2 tablets (1,000 mg total) by mouth every 6 (six) hours. (Patient not taking: Reported on 04/18/2024)   [DISCONTINUED] Bempedoic Acid-Ezetimibe (NEXLIZET ) 180-10 MG TABS Take 1 tablet by mouth daily. (Patient not taking: Reported on 04/18/2024)   [DISCONTINUED] mupirocin  cream (BACTROBAN ) 2 % Apply 1 Application topically 2 (two) times daily. (Patient not taking: Reported on 04/18/2024)   [DISCONTINUED] mupirocin  ointment (BACTROBAN ) 2 % Apply 1 Application topically daily. qd to wound on right ankle until healed   [DISCONTINUED] oxyCODONE  (ROXICODONE ) 5 MG immediate release tablet Take 1 tablet (5 mg total) by mouth every 6 (six) hours as needed. (Patient not taking: Reported on 04/18/2024)   [DISCONTINUED] Probiotic Product (ALIGN PO) Take 1 tablet by mouth daily. (Patient not taking: Reported on 04/18/2024)   [DISCONTINUED] sulfamethoxazole -trimethoprim  (BACTRIM  DS) 800-160 MG tablet Take 1 tablet by mouth 2 (two) times daily. (Patient not taking: Reported on 04/18/2024)   No facility-administered medications prior to visit.     Review of Systems  Constitutional: Negative.   HENT: Negative.    Eyes: Negative.   Respiratory: Negative.  Negative for shortness of breath.   Cardiovascular: Negative.  Negative for chest pain.  Gastrointestinal: Negative.  Negative for abdominal pain, constipation and diarrhea.  Genitourinary: Negative.   Musculoskeletal:  Negative for joint pain and myalgias.  Skin: Negative.   Neurological: Negative.  Negative for dizziness and headaches.  Endo/Heme/Allergies: Negative.   All other systems reviewed and are negative.      Objective:   BP (!) 140/80   Pulse 85   Ht 6\' 2"  (1.88 m)   Wt 149 lb 6.4 oz (67.8 kg)   SpO2 97%   BMI 19.18 kg/m   Vitals:   04/18/24 0856  BP: (!) 140/80  Pulse: 85  Height: 6\' 2"  (1.88 m)  Weight: 149 lb 6.4 oz (67.8 kg)  SpO2: 97%  BMI (Calculated): 19.17    Physical Exam Nursing note reviewed.  Constitutional:  Appearance: Normal appearance. He is normal weight.  HENT:     Head: Normocephalic and atraumatic.     Nose: Nose normal.     Mouth/Throat:     Mouth: Mucous membranes are moist.     Pharynx: Oropharynx is clear.  Eyes:     Extraocular Movements: Extraocular movements intact.     Conjunctiva/sclera: Conjunctivae normal.     Pupils: Pupils are equal, round, and reactive to light.  Cardiovascular:     Rate and Rhythm: Normal rate and regular rhythm.     Pulses: Normal pulses.     Heart sounds: Normal heart sounds.  Pulmonary:     Effort: Pulmonary effort is normal.     Breath sounds: Normal breath sounds.  Abdominal:     General: Abdomen is flat. Bowel sounds are normal.     Palpations: Abdomen is soft.  Musculoskeletal:        General: Normal range of motion.     Cervical back: Normal range of motion.  Skin:    General: Skin is warm and dry.  Neurological:     General: No focal deficit present.     Mental Status: He is alert and oriented to person, place, and time.  Psychiatric:        Mood and Affect:  Mood normal.        Behavior: Behavior normal.        Thought Content: Thought content normal.        Judgment: Judgment normal.      No results found for any visits on 04/18/24.  Recent Results (from the past 2160 hours)  Glucose, capillary     Status: Abnormal   Collection Time: 01/25/24 10:17 AM  Result Value Ref Range   Glucose-Capillary 144 (H) 70 - 99 mg/dL    Comment: Glucose reference range applies only to samples taken after fasting for at least 8 hours.  Hemoglobin A1c     Status: Abnormal   Collection Time: 01/25/24 10:34 AM  Result Value Ref Range   Hgb A1c MFr Bld 5.8 (H) 4.8 - 5.6 %    Comment: (NOTE) Pre diabetes:          5.7%-6.4%  Diabetes:              >6.4%  Glycemic control for   <7.0% adults with diabetes    Mean Plasma Glucose 119.76 mg/dL    Comment: Performed at [facility], [address], [place], [place] [address]  Basic metabolic panel per protocol     Status: Abnormal   Collection Time: 01/25/24 10:34 AM  Result Value Ref Range   Sodium 138 135 - 145 mmol/L   Potassium 4.5 3.5 - 5.1 mmol/L   Chloride 105 98 - 111 mmol/L   CO2 27 22 - 32 mmol/L   Glucose, Bld 135 (H) 70 - 99 mg/dL    Comment: Glucose reference range applies only to samples taken after fasting for at least 8 hours.   BUN 26 (H) 6 - 20 mg/dL   Creatinine, Ser 1.02 0.61 - 1.24 mg/dL   Calcium  9.5 8.9 - 10.3 mg/dL   GFR, Estimated >72 >53 mL/min    Comment: (NOTE) Calculated using the CKD-EPI Creatinine Equation (2021)    Anion gap 6 5 - 15    Comment: Performed at [facility], [address], [place], [place] [address]  CBC per protocol     Status: None   Collection Time: 01/25/24  10:34 AM  Result Value Ref Range   WBC 5.3 4.0 - 10.5 K/uL   RBC 5.16 4.22 - 5.81 MIL/uL   Hemoglobin 15.4 13.0 - 17.0 g/dL   HCT 95.2 84.1 - 32.4 %   MCV 88.4 80.0 - 100.0 fL   MCH 29.8 26.0 - 34.0 pg   MCHC 33.8 30.0 - 36.0 g/dL   RDW 40.1 02.7 - 25.3 %    Platelets 197 150 - 400 K/uL   nRBC 0.0 0.0 - 0.2 %    Comment: Performed at [facility], [address] [place], [place] [address]  Urine Culture     Status: Abnormal   Collection Time: 01/25/24 10:59 AM   Specimen: Urine, Clean Catch  Result Value Ref Range   Specimen Description      URINE, CLEAN CATCH Performed at [facility], [address] [place], [place] [address]    Special Requests      NONE Performed at [facility], [address], [place], [place] [address]    Culture >=100,000 COLONIES/mL STAPHYLOCOCCUS EPIDERMIDIS (A)    Report Status 01/27/2024 FINAL    Organism ID, Bacteria STAPHYLOCOCCUS EPIDERMIDIS (A)       Susceptibility   Staphylococcus epidermidis - MIC*    CIPROFLOXACIN <=0.5 SENSITIVE Sensitive     GENTAMICIN  <=0.5 SENSITIVE Sensitive     NITROFURANTOIN <=16 SENSITIVE Sensitive     OXACILLIN <=0.25 SENSITIVE Sensitive     TETRACYCLINE >=16 RESISTANT Resistant     VANCOMYCIN  1 SENSITIVE Sensitive     TRIMETH /SULFA  <=10 SENSITIVE Sensitive     RIFAMPIN <=0.5 SENSITIVE Sensitive     Inducible Clindamycin  NEGATIVE Sensitive     * >=100,000 COLONIES/mL STAPHYLOCOCCUS EPIDERMIDIS  Glucose, capillary     Status: Abnormal   Collection Time: 02/05/24  6:09 AM  Result Value Ref Range   Glucose-Capillary 108 (H) 70 - 99 mg/dL    Comment: Glucose reference range applies only to samples taken after fasting for at least 8 hours.  Glucose, capillary     Status: Abnormal   Collection Time: 02/05/24  9:40 AM  Result Value Ref Range   Glucose-Capillary 133 (H) 70 - 99 mg/dL    Comment: Glucose reference range applies only to samples taken after fasting for at least 8 hours.  Glucose, capillary     Status: Abnormal   Collection Time: 02/05/24 10:58 AM  Result Value Ref Range   Glucose-Capillary 132 (H) 70 - 99 mg/dL    Comment: Glucose reference range applies only to samples taken after fasting for at least  8 hours.      Assessment & Plan:  Referral sent to PT  Problem List Items Addressed This Visit       Cardiovascular and Mediastinum   PAD (peripheral artery disease) (HCC)   Relevant Medications   lisinopril  (ZESTRIL ) 40 MG tablet   Other Relevant Orders   Ambulatory referral to Physical Therapy     Other   Hx of AKA (above knee amputation), left (HCC) - Primary   Relevant Orders   Ambulatory referral to Physical Therapy    Return if symptoms worsen or fail to improve.   Total time spent: 25 minutes  Google, NP  04/18/2024   This document may have been prepared by Dragon Voice Recognition software and as such may include unintentional dictation errors.

## 2024-05-09 ENCOUNTER — Ambulatory Visit: Attending: Cardiology | Admitting: Physical Therapy

## 2024-05-09 ENCOUNTER — Encounter: Payer: Self-pay | Admitting: Physical Therapy

## 2024-05-09 ENCOUNTER — Other Ambulatory Visit: Payer: Self-pay

## 2024-05-09 VITALS — BP 171/78 | HR 79

## 2024-05-09 DIAGNOSIS — M6281 Muscle weakness (generalized): Secondary | ICD-10-CM | POA: Diagnosis present

## 2024-05-09 DIAGNOSIS — R2681 Unsteadiness on feet: Secondary | ICD-10-CM | POA: Insufficient documentation

## 2024-05-09 DIAGNOSIS — Z89612 Acquired absence of left leg above knee: Secondary | ICD-10-CM | POA: Diagnosis not present

## 2024-05-09 DIAGNOSIS — I739 Peripheral vascular disease, unspecified: Secondary | ICD-10-CM | POA: Diagnosis not present

## 2024-05-09 DIAGNOSIS — R2689 Other abnormalities of gait and mobility: Secondary | ICD-10-CM | POA: Insufficient documentation

## 2024-05-09 NOTE — Therapy (Signed)
 OUTPATIENT PHYSICAL THERAPY PROSTHETICS EVALUATION   Patient Name: Jeremy Padilla MRN: [id number] DOB:05-04-1966, 58 y.o., male Today's Date: 05/09/2024  PCP: Alica Antu, NP REFERRING PROVIDER: Alica Antu, NP  END OF SESSION:  PT End of Session - 05/09/24 1417     Visit Number 1    Number of Visits 17   16 + eval   Date for PT Re-Evaluation 07/18/24   pushed out due to scheduling delay   Authorization Type Medicare A & B with secondary Medicaid    Progress Note Due on Visit 10    PT Start Time 1400    PT Stop Time 1458    PT Time Calculation (min) 58 min    Equipment Utilized During Treatment Gait belt    Activity Tolerance Patient tolerated treatment well    Behavior During Therapy WFL for tasks assessed/performed             Past Medical History:  Diagnosis Date   Abscess 10/05/2015   Diabetes mellitus without complication (HCC)    Heart murmur    Herpes exposure    Hyperlipidemia    Hypertension    Past Surgical History:  Procedure Laterality Date   AMPUTATION Left 06/23/2020   Procedure: AMPUTATION ABOVE KNEE;  Surgeon: Celso College, MD;  Location: [facility];  Service: General;  Laterality: Left;   LOWER EXTREMITY ANGIOGRAPHY Left 04/09/2019   Procedure: LOWER EXTREMITY ANGIOGRAPHY;  Surgeon: Celso College, MD;  Location: [facility];  Service: Cardiovascular;  Laterality: Left;   LOWER EXTREMITY ANGIOGRAPHY Left 04/19/2020   Procedure: Lower Extremity Angiography;  Surgeon: Celso College, MD;  Location: [facility];  Service: Cardiovascular;  Laterality: Left;   LOWER EXTREMITY ANGIOGRAPHY Left 04/20/2020   Procedure: Lower Extremity Angiography;  Surgeon: Celso College, MD;  Location: [facility];  Service: Cardiovascular;  Laterality: Left;   LOWER EXTREMITY INTERVENTION N/A 05/30/2019   Procedure: LOWER EXTREMITY INTERVENTION;  Surgeon: Jackquelyn Mass, MD;  Location: [facility];  Service: Cardiovascular;   Laterality: N/A;   PENILE PROSTHESIS IMPLANT N/A 02/05/2024   Procedure: INSERTION OF INFLATABLE PENILE PROSTHESIS;  Surgeon: Mallie Seal, MD;  Location: [facility];  Service: Urology;  Laterality: N/A;  135 MINUTES NEEDED FOR CASE   WRIST SURGERY Left    Patient Active Problem List   Diagnosis Date Noted   Skin abrasion 01/29/2024   Edema of toe 08/20/2023   Toe infection 08/20/2023   Diabetic polyneuropathy associated with diabetes mellitus due to underlying condition (HCC) 05/18/2023   Benign mole 03/20/2023   Mixed hyperlipidemia 03/20/2023   Hx of AKA (above knee amputation), left (HCC) 09/10/2020   Protein-calorie malnutrition, severe 06/23/2020   Pressure injury of skin 06/22/2020   BKA stump complication (HCC) 06/21/2020   Hyponatremia 06/21/2020   Dehydration 06/21/2020   Ischemic leg 04/20/2020   Ischemia 04/16/2020   Ischemia of left lower extremity 05/30/2019   PAD (peripheral artery disease) (HCC) 04/08/2019   Lacunar infarction (HCC) 08/03/2017   Bruit of left carotid artery 08/03/2017   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) 08/02/2017   Abscess or cellulitis, neck 10/08/2015   Hyperglycemia 10/08/2015   Hypertension goal BP (blood pressure) < 140/90 10/08/2015   Heart murmur 10/08/2015    ONSET DATE: July 2021 (initial amputation)  REFERRING DIAG: Y40.347 (ICD-10-CM) - Hx of AKA (above knee amputation), left (HCC) I73.9 (ICD-10-CM) - PAD (peripheral artery disease) (HCC)  THERAPY DIAG:  Other abnormalities of gait and mobility  Unsteadiness on feet  Muscle weakness (generalized)  Rationale for Evaluation and Treatment: Rehabilitation  SUBJECTIVE:   SUBJECTIVE STATEMENT: Patient presents for re-evaluation, his BP was too low at last eval.  He was taken off some of his BP medicines and this has helped, but his BP sometimes swings high.  Today he ambulates in with standard walker that is broke to the point the left  handle does not hold itself in place.  He states he has a 2WW but he is not comfortable with the wheels.  He has tried a cane in his home but is not quite ready for this.  He is interested in trying loftstrands because he saw this on TV recently.  He reports his prosthetic presses into his groin and he is using a sock (uncertain of ply) to help with the height and comfort.  He states he talked to [employer] and they feel with PT progression he could get new prosthetic componentry.  He is also interested in getting a new standard walker to replace his broken one. Pt accompanied by: self (drives himself)  PERTINENT HISTORY: HTN (goal <140/90), DM2, lacunar infarction, PAD, heart murmur, diabetic retinopathy (treated with shots)  PAIN:  Are you having pain? 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 No  LIVING ENVIRONMENT: Lives with: sister comes and lives part-time Lives in: House/apartment Home Access: Stairs to enter Home layout: One level Stairs: Yes: External: 4-5 steps; can reach both Has following equipment at home: Single point cane, Walker - 2 wheeled, shower chair, Grab bars, and standard walker - no wheels  PLOF: Requires assistive device for independence  PATIENT GOALS: "to walk better and maybe get a better prosthetic, to be more independent"  OBJECTIVE:  Note: Objective measures were completed at Evaluation unless otherwise noted.  DIAGNOSTIC FINDINGS: No recent relevant imaging.  COGNITION: Overall cognitive status: Within functional limits for tasks assessed   SENSATION: Light touch: WFL  POSTURE: forward head  LOWER EXTREMITY ROM:  Active ROM Right eval Left eval  Hip flexion WNL WNL  Hip extension    Hip abduction    Hip adduction    Hip internal rotation    Hip external rotation    Knee flexion    Knee extension     Ankle dorsiflexion    Ankle plantarflexion    Ankle inversion    Ankle eversion     (Blank rows = not tested)  LOWER EXTREMITY MMT:  MMT Right eval Left eval  Hip flexion 5/5 5/5  Hip extension  4/5  Hip abduction " 4/5  Hip adduction "   Hip internal rotation    Hip external rotation    Knee flexion "   Knee extension "   Ankle dorsiflexion "   Ankle plantarflexion    Ankle inversion    Ankle eversion    (Blank rows = not tested)  BED MOBILITY:  Sit to supine Complete Independence Supine to sit Complete Independence Rolling to Right Complete Independence Rolling to Left Complete Independence  TRANSFERS: Sit to stand: SBA Stand to sit: SBA  GAIT: Gait pattern: step to pattern, decreased step length- Right, decreased step length- Left, decreased stance time- Right, decreased stance time- Left, decreased stride length, decreased hip/knee flexion- Right, decreased hip/knee flexion- Left, decreased  ankle dorsiflexion- Right, shuffling, poor foot clearance- Right, and poor foot clearance- Left Distance walked: various clinic distances Assistive device utilized: standard walker Level of assistance: SBA Gait velocity: To be assessed. ft/sec Comments: Pt uses excess UE strength to control broken walker handle.   FUNCTIONAL TESTS:  5 times sit to stand: To be assessed. 10 meter walk test: To be assessed. AMPPRO - To be assessed.  CURRENT PROSTHETIC WEAR ASSESSMENT: Patient is independent with: skin check, residual limb care, care of non-amputated limb, prosthetic cleaning, ply sock cleaning, proper wear schedule/adjustment, and proper weight-bearing schedule/adjustment Patient is dependent with: correct ply sock adjustment Donning prosthesis: Complete Independence Doffing prosthesis: Complete Independence Prosthetic wear tolerance: all awake hours/day, 7 days/week Prosthetic weight bearing tolerance: as long as needed w/ AD  Edema: none significant Residual limb  condition: mild scar invagination, well healed Prosthetic description: K1 - lanyard socket, single axis knee, sach foot, fixed ankle K code/activity level with prosthetic use: Level 1  PATIENT SURVEYS:  None applicable to chief complaint                                                                                                                              TREATMENT DATE: 05/09/2024   TREATMENT:  PATIENT EDUCATED ON FOLLOWING PROSTHETIC CARE: Other education  Residual limb care, Prosthetic cleaning, and Correct ply sock adjustment  PATIENT EDUCATION: Education details: Discussed liner cleaning - confirmed he has 2 new liners, AD needs and assessment in future, assessments to be used, BP parameters for therapy - monitor at home (he has monitor), checking blood glucose, initial HEP to address functional left hip weakness.  PT POC and goals to be assessed.  Vivewear and antiperspirant.  Person educated: Patient Education method: Explanation, Demonstration, and Handouts Education comprehension: verbalized understanding and needs further education  HOME EXERCISE PROGRAM: Access Code: [email or web address] URL: [email or web address] Date: 05/09/2024 Prepared by: Marilou Showman  Exercises - Sidelying Hip Abduction wtih Flexion and Extension (BKA)  - 1 x daily - 7 x weekly - 3 sets - 10 reps - Sidelying Hip Abduction (AKA)  - 1 x daily - 7 x weekly - 3 sets - 10 reps - Sidelying Hip Circles (AKA)  - 1 x daily - 7 x weekly - 3 sets - 10 reps - Supine Psoas Stretch (AKA)  - 1 x daily - 7 x weekly - 3 sets - 10 reps  ASSESSMENT:  CLINICAL IMPRESSION: Patient is a 57 y.o. male who was seen today for physical therapy evaluation and treatment for left AKA improved management and utilization.  Pt has a significant PMH of HTN (goal <140/90), DM2, lacunar infarction, PAD, heart murmur, and diabetic retinopathy (treated with shots).  Identified impairments include visual limitations,  reliance on sound leg during step to gait, DME needs to replace current AD, left hip weakness, decreased dynamic stability and decreased safety awareness.  Education provided to improve sock ply and sweat management for safety and comfort  of prosthetic use.  Evaluation was limited due to safety concerns, elevated BP outside recommended range, and education needs.  He would benefit from skilled PT to address impairments as noted and progress towards long term goals.  OBJECTIVE IMPAIRMENTS: Abnormal gait, decreased activity tolerance, decreased balance, decreased endurance, decreased knowledge of use of DME, decreased mobility, difficulty walking, decreased strength, decreased safety awareness, impaired vision/preception, improper body mechanics, postural dysfunction, and prosthetic dependency .   ACTIVITY LIMITATIONS: carrying, lifting, bending, standing, squatting, stairs, transfers, and locomotion level  PARTICIPATION LIMITATIONS: community activity, occupation, and yard work  PERSONAL FACTORS: Past/current experiences, Time since onset of injury/illness/exacerbation, and 3+ comorbidities: lacunar infarction, HTN, diabetic retinopathy are also affecting patient's functional outcome.   REHAB POTENTIAL: Good  CLINICAL DECISION MAKING: Evolving/moderate complexity  EVALUATION COMPLEXITY: Moderate   GOALS: Goals reviewed with patient? Yes  SHORT TERM GOALS: Target date: 06/06/2024  Pt will be independent and compliant with introductory prosthetic management, strengthening and standing balance HEP in order to maintain functional progress and improve mobility. Baseline:  Initiated on eval Goal status: INITIAL  2.  Pt will be independent in sock ply adjustment in order to improve wear tolerance and prosthetic fit. Baseline: unaware of sock ply and how to appropriately adjust for fit on eval Goal status: INITIAL  3.  5xSTS to be assessed w/ STG set as appropriate. Baseline:  To be  assessed. Goal status: INITIAL  4.  to be assessed w/ goal set as appropriate. Baseline: To be assessed. Goal status: INITIAL  5.  AMPPRO to be assessed w/ goal set as appropriate. Baseline: To be assessed. Goal status: INITIAL  LONG TERM GOALS: Target date: 07/04/2024  Pt will be independent and compliant with advanced prosthetic management, strengthening and standing balance HEP in order to maintain functional progress and improve mobility. Baseline: To be progressed. Goal status: INITIAL  2.   5xSTS to be assessed w/ LTG set as appropriate. Baseline: To be assessed. Goal status: INITIAL  3.  to be assessed w/ goal set as appropriate. Baseline: To be assessed. Goal status: INITIAL  4.  AMPPRO to be assessed w/ goal set as appropriate. Baseline: To be assessed. Goal status: INITIAL  5.  Pt will navigate 4 stairs using step-to pattern and bilateral rails at mod I level to demonstrate safety in home environment. Baseline: Pt reports ind at home Goal status: INITIAL  6.  Pt will initiate ambulation in clinic >/=50 ft with least restrictive cane option in order to demonstrate improved upright stability and prosthetic management. Baseline: Pt attempts a few feet with cane in home only Goal status: INITIAL   PLAN:  PT FREQUENCY: 2x/week  PT DURATION: 8 weeks  PLANNED INTERVENTIONS: 97164- PT Re-evaluation, 97750- Physical Performance Testing, 97110-Therapeutic exercises, 97530- Therapeutic activity, [id number]- Neuromuscular re-education, 97535- Self Care, 16109- Manual therapy, [id number]- Gait training, [phone]- Prosthetic Initial , [phone]- Electrical stimulation (manual), Patient/Family education, Balance training, Stair training, Vestibular training, and DME instructions  PLAN FOR NEXT SESSION: ASSESS 5xSTS, , and AMPPRO - set goals as appropriate.  Single leg bridges, work towards cane, progress HEP - standing balance/sink HEP?  Sock ply adjustment.  Determine benefit  from loftstrands vs standard walker - request order.  Did he bring 2WW?  Did he follow-up with [employer] regarding water /sand protection of current prosthetic?   Earlean Glaze, PT, DPT 05/09/2024, 3:31 PM

## 2024-05-09 NOTE — Patient Instructions (Signed)
 Access Code: [email or web address] URL: [email or web address] Date: 05/09/2024 Prepared by: Marilou Showman  Exercises - Sidelying Hip Abduction wtih Flexion and Extension (BKA)  - 1 x daily - 7 x weekly - 3 sets - 10 reps - Sidelying Hip Abduction (AKA)  - 1 x daily - 7 x weekly - 3 sets - 10 reps - Sidelying Hip Circles (AKA)  - 1 x daily - 7 x weekly - 3 sets - 10 reps - Supine Psoas Stretch (AKA)  - 1 x daily - 7 x weekly - 3 sets - 10 reps

## 2024-05-13 ENCOUNTER — Ambulatory Visit: Attending: Cardiology | Admitting: Physical Therapy

## 2024-05-13 ENCOUNTER — Encounter: Payer: Self-pay | Admitting: Physical Therapy

## 2024-05-13 DIAGNOSIS — M6281 Muscle weakness (generalized): Secondary | ICD-10-CM | POA: Diagnosis present

## 2024-05-13 DIAGNOSIS — R2681 Unsteadiness on feet: Secondary | ICD-10-CM | POA: Diagnosis present

## 2024-05-13 DIAGNOSIS — R2689 Other abnormalities of gait and mobility: Secondary | ICD-10-CM | POA: Insufficient documentation

## 2024-05-13 DIAGNOSIS — R269 Unspecified abnormalities of gait and mobility: Secondary | ICD-10-CM | POA: Insufficient documentation

## 2024-05-13 NOTE — Therapy (Unsigned)
 OUTPATIENT PHYSICAL THERAPY PROSTHETICS TREATMENT   Patient Name: Jeremy Padilla MRN: [id number] DOB:10/22/1966, 58 y.o., male Today's Date: 05/13/2024  PCP: Alica Antu, NP REFERRING PROVIDER: Alica Antu, NP  END OF SESSION:  PT End of Session - 05/13/24 1634     Visit Number 2    Number of Visits 17   16 + eval   Date for PT Re-Evaluation 07/18/24   pushed out due to scheduling delay   Authorization Type Medicare A & B with secondary Medicaid    Progress Note Due on Visit 10    PT Start Time 1621    PT Stop Time 1708    PT Time Calculation (min) 47 min    Equipment Utilized During Treatment Gait belt    Activity Tolerance Patient tolerated treatment well    Behavior During Therapy WFL for tasks assessed/performed             Past Medical History:  Diagnosis Date   Abscess 10/05/2015   Diabetes mellitus without complication (HCC)    Heart murmur    Herpes exposure    Hyperlipidemia    Hypertension    Past Surgical History:  Procedure Laterality Date   AMPUTATION Left 06/23/2020   Procedure: AMPUTATION ABOVE KNEE;  Surgeon: Celso College, MD;  Location: [facility];  Service: General;  Laterality: Left;   LOWER EXTREMITY ANGIOGRAPHY Left 04/09/2019   Procedure: LOWER EXTREMITY ANGIOGRAPHY;  Surgeon: Celso College, MD;  Location: [facility];  Service: Cardiovascular;  Laterality: Left;   LOWER EXTREMITY ANGIOGRAPHY Left 04/19/2020   Procedure: Lower Extremity Angiography;  Surgeon: Celso College, MD;  Location: [facility];  Service: Cardiovascular;  Laterality: Left;   LOWER EXTREMITY ANGIOGRAPHY Left 04/20/2020   Procedure: Lower Extremity Angiography;  Surgeon: Celso College, MD;  Location: [facility];  Service: Cardiovascular;  Laterality: Left;   LOWER EXTREMITY INTERVENTION N/A 05/30/2019   Procedure: LOWER EXTREMITY INTERVENTION;  Surgeon: Jackquelyn Mass, MD;  Location: [facility];  Service: Cardiovascular;   Laterality: N/A;   PENILE PROSTHESIS IMPLANT N/A 02/05/2024   Procedure: INSERTION OF INFLATABLE PENILE PROSTHESIS;  Surgeon: Mallie Seal, MD;  Location: [facility];  Service: Urology;  Laterality: N/A;  135 MINUTES NEEDED FOR CASE   WRIST SURGERY Left    Patient Active Problem List   Diagnosis Date Noted   Skin abrasion 01/29/2024   Edema of toe 08/20/2023   Toe infection 08/20/2023   Diabetic polyneuropathy associated with diabetes mellitus due to underlying condition (HCC) 05/18/2023   Benign mole 03/20/2023   Mixed hyperlipidemia 03/20/2023   Hx of AKA (above knee amputation), left (HCC) 09/10/2020   Protein-calorie malnutrition, severe 06/23/2020   Pressure injury of skin 06/22/2020   BKA stump complication (HCC) 06/21/2020   Hyponatremia 06/21/2020   Dehydration 06/21/2020   Ischemic leg 04/20/2020   Ischemia 04/16/2020   Ischemia of left lower extremity 05/30/2019   PAD (peripheral artery disease) (HCC) 04/08/2019   Lacunar infarction (HCC) 08/03/2017   Bruit of left carotid artery 08/03/2017   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) 08/02/2017   Abscess or cellulitis, neck 10/08/2015   Hyperglycemia 10/08/2015   Hypertension goal BP (blood pressure) < 140/90 10/08/2015   Heart murmur 10/08/2015    ONSET DATE: July 2021 (initial amputation)  REFERRING DIAG: W09.811 (ICD-10-CM) - Hx of AKA (above knee amputation), left (HCC) I73.9 (ICD-10-CM) - PAD (peripheral artery disease) (HCC)  THERAPY DIAG:  Other abnormalities of gait and mobility  Unsteadiness on feet  Muscle weakness (generalized)  Abnormality of gait and mobility  Rationale for Evaluation and Treatment: Rehabilitation  SUBJECTIVE:   SUBJECTIVE STATEMENT: Patient presents for first treatment following evaluation.  He has had no falls and denies pain.  His prosthetic socket is fitting better today as he doubled his typical sock ply and this improved his groin  comfort.  He is unsure what ply he is wearing.  He ambulates with 2WW.   Pt accompanied by: self (drives himself)  PERTINENT HISTORY: HTN (goal <140/90), DM2, lacunar infarction, PAD, heart murmur, diabetic retinopathy (treated with shots)  PAIN:  Are you having pain? 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 No  LIVING ENVIRONMENT: Lives with: sister comes and lives part-time Lives in: House/apartment Home Access: Stairs to enter Home layout: One level Stairs: Yes: External: 4-5 steps; can reach both Has following equipment at home: Single point cane, Walker - 2 wheeled, shower chair, Grab bars, and standard walker - no wheels  PLOF: Requires assistive device for independence  PATIENT GOALS: "to walk better and maybe get a better prosthetic, to be more independent"  OBJECTIVE:  Note: Objective measures were completed at Evaluation unless otherwise noted.  DIAGNOSTIC FINDINGS: No recent relevant imaging.  COGNITION: Overall cognitive status: Within functional limits for tasks assessed   SENSATION: Light touch: WFL  POSTURE: forward head  LOWER EXTREMITY ROM:  Active ROM Right eval Left eval  Hip flexion WNL WNL  Hip extension    Hip abduction    Hip adduction    Hip internal rotation    Hip external rotation    Knee flexion    Knee extension    Ankle dorsiflexion    Ankle plantarflexion    Ankle inversion    Ankle eversion     (Blank rows = not tested)  LOWER EXTREMITY MMT:  MMT Right eval Left eval  Hip flexion 5/5 5/5  Hip extension  4/5  Hip abduction " 4/5  Hip adduction "   Hip internal rotation    Hip external rotation    Knee flexion "   Knee extension "   Ankle dorsiflexion "   Ankle plantarflexion    Ankle inversion    Ankle eversion    (Blank rows = not tested)  BED MOBILITY:  Sit to supine  Complete Independence Supine to sit Complete Independence Rolling to Right Complete Independence Rolling to Left Complete Independence  TRANSFERS: Sit to stand: SBA Stand to sit: SBA  GAIT: Gait pattern: step to pattern, decreased step length- Right, decreased step length- Left, decreased stance time- Right, decreased stance time- Left, decreased stride length, decreased hip/knee flexion- Right, decreased hip/knee flexion- Left, decreased ankle dorsiflexion- Right, shuffling, poor foot clearance- Right, and poor foot clearance- Left Distance walked: various clinic distances Assistive device utilized: standard walker Level of assistance: SBA Gait velocity: To be assessed. ft/sec Comments: Pt uses excess UE strength to control broken walker handle.   FUNCTIONAL TESTS:  5 times sit to stand: To be assessed. 10 meter walk test: To be assessed. AMPPRO - To be assessed.  CURRENT PROSTHETIC WEAR ASSESSMENT: Patient is independent with: skin check, residual limb care, care of non-amputated limb, prosthetic cleaning, ply sock cleaning, proper wear schedule/adjustment, and proper weight-bearing schedule/adjustment Patient is  dependent with: correct ply sock adjustment Donning prosthesis: Complete Independence Doffing prosthesis: Complete Independence Prosthetic wear tolerance: all awake hours/day, 7 days/week Prosthetic weight bearing tolerance: as long as needed w/ AD  Edema: none significant Residual limb condition: mild scar invagination, well healed Prosthetic description: K1 - lanyard socket, single axis knee, sach foot, fixed ankle K code/activity level with prosthetic use: Level 1  PATIENT SURVEYS:  None applicable to chief complaint                                                                                                                              TREATMENT DATE: 05/13/2024 -Assessed sock ply in private room, yellow band but no markings indicating ply, thickness feels  like that of typical 5-ply and pt is wearing 2 of these folded over rim of socket -5xSTS:  12.75 sec w/ BUE and moderate use of momentum to initiate stand - :  24.69 sec using 2WW SBA = 0.41 m/sec OR 1.34 ft/sec -AMPPRO: AMPUTEE MOBILITY PREDICTOR ASSESSMENT TOOL Initial instructions: Client is seated in a hard chair with arms. The following manoeuvres are tested with or without the use of the prosthesis.  Advise the person of each task or group of tasks prior to performance.  Please avoid unnecessary chatter throughout the test.  Safety First, no task should be performed if either the tester or client is uncertain of a safe outcome.  1. Sitting Balance: Sit forward in a chair with arms folded across chest for 60s. Cannot sit upright independently for 60s Can sit upright independently for 60s = 0 = 1  1  2. Sitting reach:  Reach forwards and grasp the ruler.  (Tester holds ruler 12in beyond extended arms midline to the sternum) Does not attempt Cannot grasp or requires arm support Reaches forward and successfully grasps item.  = 0 = 1  = 2    2  3. Chair to chair transfer: 2 chairs at 90. Pt. may choose direction and use their upper limbs. Cannot do or requires physical assistance Performs independently, but appears unsteady Performs independently, appears to be steady and safe = 0  = 1 = 2  1  4. Arises from a chair: Ask pt. to fold arms across chest and stand. If unable, use arms or assistive device. Unable without help (physical assistance) Able, uses arms/assist device to help Able, without using arms = 0  = 1 = 2   1  5. Attempts to arise from a chair: (stopwatch ready) If attempt in no. 4. was without arms then ignore and allow another attempt without penalty. Unable without help (physical assistance) Able requires >1 attempt Able to rise one attempt = 0  = 1 = 2   0  6. Immediate Standing Balance: (first 5s) Begin timing immediately. Unsteady (staggers,  moves foot, sways ) Steady using walking aid or other support Steady without walker or other support = 0 = 1  = 2  1  7. Standing Balance (30s): (stopwatch ready) For item no.'s 7 & 8, first attempt is without assistive device.  If support is required allow after first attempt Unsteady Steady but uses walking aid or other support Standing without support = 0  = 1 = 2    2  8. Single limb standing balance: (stopwatch ready) Time the duration of single limb standing on both the sound and prosthetic limb up to 30s.   Grade the quality, not the time.  *Eliminate item 8 for AMPnoPRO*  Sound side  30 seconds  Prosthetic side 30 seconds Non-prosthetic side Unsteady Steady but uses walking aid or other support for 30s Single-limb standing without support for 30s  Prosthetic Side Unsteady Steady but uses walking aid or other support for 30s Single-limb standing without support for 30s  = 0 = 1  = 2    = 0  = 1  = 2    1     1   9. Standing reach: Reach forward and grasp the ruler.  (Tester holds ruler 12in beyond extended arm(s) midline to the sternum) Does not attempt Cannot grasp or requires arm support on assistive device Reaches forward and successfully grasps item no support = 0  = 1  = 2   1  10. Nudge test: With feet as close together as possible, examiner pushes lightly on pt.'s sternum with palm of hand 3 times (toes should rise) Begins to fall Staggers, grabs, catches self ore uses assistive device Steady = 0  = 1 = 2   1  11. Eyes Closed: (at maximum position #7) If support is required grade as unsteady. Unsteady or grips assistive device Steady without any use of assistive device = 0  = 1  1    12. Pick up objects off the floor: Pick up a pencil off the floor placed midline 12in in front of foot. Unable to pick up object and return to standing Performs with some help (table, chair, walking aid etc) Performs independently (without  help) = 0  = 1   = 2  2  13. Sitting down:  Ask pt. to fold arms across chest and sit. If unable, use arm or assistive device. Unsafe (misjudged distance, falls into chair ) Uses arms, assistive device or not a smooth motion Safe, smooth motion = 0  = 1 = 2   2  14. Initiation of gait: (immediately after told to "go") Any hesitancy or multiple attempts to start No hesitancy = 0 = 1  1  15. Step length and height: Walk a measured distance of 29ft twice (up and back). Four scores are required or two scores (a. & b.) for each leg. "Marked deviation" is defined as extreme substitute movements to avoid clearing the floor. a. Swing Foot Does not advance a minimum of 12in Advances a minimum of 12in  b. Foot Clearance Foot does not completely clear floor without deviation Foot completely clears floor without marked deviation  = 0  = 1   = 0 = 1 Prosthesis  0   1 Sound  0   1  16. Step Continuity Stopping or discontinuity between steps (stop & go gait) Steps appear continuous = 0  = 1  1  17. Turning:  180 degree turn when returning to chair. Unable to turn, requires intervention to prevent falling Greater than three steps but completes task without intervention No more than three continuous steps with or without assistive aid =  0  = 1  = 2    1  18. Variable cadence:  Walk a distance of 23ft fast as possible safely 4 times.  (Speeds may vary from slow to fast and fast to slow varying cadence) Unable to vary cadence in a controlled manner Asymmetrical increase in cadence controlled manner Symmetrical increase in speed in a controlled manner  = 0 = 1 = 2      1  19. Stepping over an obstacle: Place a movable box of 4in in height in the walking path. Cannot step over the box Catches foot, interrupts stride Steps over without interrupting stride = 0 = 1 = 2   0  20. Stairs (must have at least 2 steps):  Try to go up and down these stairs without  holding on to the railing.  Don't hesitate to permit pt. to hold on to rail.  Safety First, if examiner feels that any risk in involved omit and score as 0.  Ascending Unsteady, cannot do One step at a time, or must hold on to railing or device Step over step, does not hold onto the railing or device  Descending Unsteady, cannot do One step at a time, or must hold on to railing or device Step over step, does not hold onto the railing or device  = 0  = 1 = 2    = 0  = 1 = 2   1     1   21. Assistive device selection:  Add points for the use of an assistive device if used for two or more items.  If testing without prosthesis use of appropriate assistive device is mandatory.   Bed bound Wheelchair / Parallel Bars Walker Crutches (axillary or forearm) Cane (straight or quad) None = 0 = 1 = 2 = 3 = 4 = 5    2    Total Score      AMPnoPRO    N/A  /43                          AMPPRO      27 /47     K LEVEL (converted from AMP score)  AMPnoPRO   K0 = (0-8)    K1= (9-20)    K2 = (21-28)    K3 = (29-36)    K4 = (37-43)  AMPPRO    K1 = (15-26)      K2 = (27-36)     K3 = (37-42)     K4 = (43-47)   TREATMENT:  PATIENT EDUCATED ON FOLLOWING PROSTHETIC CARE: Other education  Residual limb care, Prosthetic cleaning, and Correct ply sock adjustment  PATIENT EDUCATION: Education details: Process for obtaining higher level componentry - plan to contact [employer] at [facility] assessment regarding potential changes to prosthetic to meet current functional level.  Using 2WW and working towards LRAD in future sessions.  Continue HEP.  Person educated: Patient Education method: Explanation, Demonstration, and Handouts Education comprehension: verbalized understanding and needs further education  HOME EXERCISE PROGRAM: Access Code: [email or web address] URL: [email or web address] Date: 05/09/2024 Prepared by: Marilou Showman  Exercises - Sidelying Hip Abduction wtih Flexion and  Extension (BKA)  - 1 x daily - 7 x weekly - 3 sets - 10 reps - Sidelying Hip Abduction (AKA)  - 1 x daily - 7 x weekly - 3 sets - 10 reps - Sidelying Hip Circles (AKA)  - 1 x daily - 7 x weekly -  3 sets - 10 reps - Supine Psoas Stretch (AKA)  - 1 x daily - 7 x weekly - 3 sets - 10 reps  ASSESSMENT:  CLINICAL IMPRESSION: Patient is a 58 y.o. male who was seen today for physical therapy evaluation and treatment for left AKA improved management and utilization.  Pt has a significant PMH of HTN (goal <140/90), DM2, lacunar infarction, PAD, heart murmur, and diabetic retinopathy (treated with shots).  Identified impairments include visual limitations, reliance on sound leg during step to gait, DME needs to replace current AD, left hip weakness, decreased dynamic stability and decreased safety awareness.  Education provided to improve sock ply and sweat management for safety and comfort of prosthetic use.  Evaluation was limited due to safety concerns, elevated BP outside recommended range, and education needs.  He would benefit from skilled PT to address impairments as noted and progress towards long term goals.  OBJECTIVE IMPAIRMENTS: Abnormal gait, decreased activity tolerance, decreased balance, decreased endurance, decreased knowledge of use of DME, decreased mobility, difficulty walking, decreased strength, decreased safety awareness, impaired vision/preception, improper body mechanics, postural dysfunction, and prosthetic dependency .   ACTIVITY LIMITATIONS: carrying, lifting, bending, standing, squatting, stairs, transfers, and locomotion level  PARTICIPATION LIMITATIONS: community activity, occupation, and yard work  PERSONAL FACTORS: Past/current experiences, Time since onset of injury/illness/exacerbation, and 3+ comorbidities: lacunar infarction, HTN, diabetic retinopathy are also affecting patient's functional outcome.   REHAB POTENTIAL: Good  CLINICAL DECISION MAKING: Evolving/moderate  complexity  EVALUATION COMPLEXITY: Moderate   GOALS: Goals reviewed with patient? Yes  SHORT TERM GOALS: Target date: 06/06/2024  Pt will be independent and compliant with introductory prosthetic management, strengthening and standing balance HEP in order to maintain functional progress and improve mobility. Baseline:  Initiated on eval Goal status: INITIAL  2.  Pt will be independent in sock ply adjustment in order to improve wear tolerance and prosthetic fit. Baseline: unaware of sock ply and how to appropriately adjust for fit on eval Goal status: INITIAL  3.  Pt will decrease 5xSTS to </=12 seconds w/ no more than single UE support and minimum momentum to initiate transfer in order to demonstrate decreased risk for falls and improved functional bilateral LE strength and power. Baseline:  12.75 sec w/ BUE and moderate use of momentum to initiate stand (6/3) Goal status: INITIAL  4.  Pt will demonstrate a gait speed of >/=1.54 feet/sec in order to decrease risk for falls. Baseline: 1.34 ft/sec w/ 2WW SBA (6/3) Goal status: INITIAL  5.  AMPPRO to be assessed w/ goal set as appropriate.  *** Baseline: To be assessed. Goal status: INITIAL  LONG TERM GOALS: Target date: 07/04/2024  Pt will be independent and compliant with advanced prosthetic management, strengthening and standing balance HEP in order to maintain functional progress and improve mobility. Baseline: To be progressed. Goal status: INITIAL  2.   5xSTS to be assessed w/ LTG set as appropriate. Baseline: Assessed 6/3 - no LTG needed at this time. Goal status: REVISED - D/C'd  3.  Pt will demonstrate a gait speed of >/=1.74 feet/sec in order to decrease risk for falls. Baseline: 1.34 ft/sec w/ 2WW SBA (6/3) Goal status: INITIAL  4.  AMPPRO to be assessed w/ goal set as appropriate. *** Baseline: To be assessed. Goal status: INITIAL  5.  Pt will navigate 4 stairs using step-to pattern and bilateral rails at mod I  level to demonstrate safety in home environment. Baseline: Pt reports ind at home Goal status: INITIAL  6.  Pt will initiate ambulation in clinic >/=50 ft with least restrictive cane option in order to demonstrate improved upright stability and prosthetic management. Baseline: Pt attempts a few feet with cane in home only Goal status: INITIAL   PLAN:  PT FREQUENCY: 2x/week  PT DURATION: 8 weeks  PLANNED INTERVENTIONS: [id number]- PT Re-evaluation, 97750- Physical Performance Testing, 97110-Therapeutic exercises, 97530- Therapeutic activity, [id number]- Neuromuscular re-education, 97535- Self Care, 16109- Manual therapy, [id number]- Gait training, [phone]- Prosthetic Initial , [phone]- Electrical stimulation (manual), Patient/Family education, Balance training, Stair training, Vestibular training, and DME instructions  PLAN FOR NEXT SESSION: Single leg bridges, work towards cane, progress HEP - standing balance/sink HEP?  Sock ply adjustment.  Determine benefit from loftstrands vs standard walker - request order.  Did he bring 2WW?  Did he follow-up with [employer] regarding water /sand protection of current prosthetic?  If progressing on AMPPRO at [facility] - contact [employer] regarding possible prosthetic changes.  Earlean Glaze, PT, DPT 05/13/2024, 5:12 PM

## 2024-05-15 ENCOUNTER — Encounter: Payer: Self-pay | Admitting: Cardiology

## 2024-05-20 ENCOUNTER — Ambulatory Visit

## 2024-05-20 VITALS — BP 173/81 | HR 79

## 2024-05-20 DIAGNOSIS — R269 Unspecified abnormalities of gait and mobility: Secondary | ICD-10-CM

## 2024-05-20 DIAGNOSIS — R2689 Other abnormalities of gait and mobility: Secondary | ICD-10-CM | POA: Diagnosis not present

## 2024-05-20 DIAGNOSIS — R2681 Unsteadiness on feet: Secondary | ICD-10-CM

## 2024-05-20 DIAGNOSIS — M6281 Muscle weakness (generalized): Secondary | ICD-10-CM

## 2024-05-20 NOTE — Therapy (Signed)
 OUTPATIENT PHYSICAL THERAPY PROSTHETICS TREATMENT   Patient Name: Jeremy Padilla MRN: [id number] DOB:1966-09-16, 58 y.o., male Today's Date: 05/20/2024  PCP: Alica Antu, NP REFERRING PROVIDER: Alica Antu, NP  END OF SESSION:  PT End of Session - 05/20/24 1258     Visit Number 3    Number of Visits 17    Date for PT Re-Evaluation 07/18/24    Authorization Type Medicare A & B with secondary Medicaid    Progress Note Due on Visit 10    PT Start Time 1315    PT Stop Time 1355    PT Time Calculation (min) 40 min    Equipment Utilized During Treatment Gait belt    Activity Tolerance Patient tolerated treatment well    Behavior During Therapy WFL for tasks assessed/performed             Past Medical History:  Diagnosis Date   Abscess 10/05/2015   Diabetes mellitus without complication (HCC)    Heart murmur    Herpes exposure    Hyperlipidemia    Hypertension    Past Surgical History:  Procedure Laterality Date   AMPUTATION Left 06/23/2020   Procedure: AMPUTATION ABOVE KNEE;  Surgeon: Celso College, MD;  Location: [facility];  Service: General;  Laterality: Left;   LOWER EXTREMITY ANGIOGRAPHY Left 04/09/2019   Procedure: LOWER EXTREMITY ANGIOGRAPHY;  Surgeon: Celso College, MD;  Location: [facility];  Service: Cardiovascular;  Laterality: Left;   LOWER EXTREMITY ANGIOGRAPHY Left 04/19/2020   Procedure: Lower Extremity Angiography;  Surgeon: Celso College, MD;  Location: [facility];  Service: Cardiovascular;  Laterality: Left;   LOWER EXTREMITY ANGIOGRAPHY Left 04/20/2020   Procedure: Lower Extremity Angiography;  Surgeon: Celso College, MD;  Location: [facility];  Service: Cardiovascular;  Laterality: Left;   LOWER EXTREMITY INTERVENTION N/A 05/30/2019   Procedure: LOWER EXTREMITY INTERVENTION;  Surgeon: Jackquelyn Mass, MD;  Location: [facility];  Service: Cardiovascular;  Laterality: N/A;   PENILE PROSTHESIS IMPLANT N/A  02/05/2024   Procedure: INSERTION OF INFLATABLE PENILE PROSTHESIS;  Surgeon: Mallie Seal, MD;  Location: [facility];  Service: Urology;  Laterality: N/A;  135 MINUTES NEEDED FOR CASE   WRIST SURGERY Left    Patient Active Problem List   Diagnosis Date Noted   Skin abrasion 01/29/2024   Edema of toe 08/20/2023   Toe infection 08/20/2023   Diabetic polyneuropathy associated with diabetes mellitus due to underlying condition (HCC) 05/18/2023   Benign mole 03/20/2023   Mixed hyperlipidemia 03/20/2023   Hx of AKA (above knee amputation), left (HCC) 09/10/2020   Protein-calorie malnutrition, severe 06/23/2020   Pressure injury of skin 06/22/2020   BKA stump complication (HCC) 06/21/2020   Hyponatremia 06/21/2020   Dehydration 06/21/2020   Ischemic leg 04/20/2020   Ischemia 04/16/2020   Ischemia of left lower extremity 05/30/2019   PAD (peripheral artery disease) (HCC) 04/08/2019   Lacunar infarction (HCC) 08/03/2017   Bruit of left carotid artery 08/03/2017   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) 08/02/2017   Abscess or cellulitis, neck 10/08/2015   Hyperglycemia 10/08/2015   Hypertension goal BP (blood pressure) < 140/90 10/08/2015   Heart murmur 10/08/2015    ONSET DATE: July 2021 (initial amputation)  REFERRING DIAG: W09.811 (ICD-10-CM) - Hx of AKA (above knee amputation), left (HCC) I73.9 (ICD-10-CM) - PAD (peripheral artery disease) (HCC)  THERAPY DIAG:  Other abnormalities of gait and mobility  Unsteadiness on feet  Muscle weakness (generalized)  Abnormality of gait and mobility  Rationale for Evaluation and Treatment: Rehabilitation  SUBJECTIVE:   SUBJECTIVE STATEMENT: Patient reports doing well. Walking with 2WW. 2ply socks on.  Pt accompanied by: self (drives himself)  PERTINENT HISTORY: HTN (goal <140/90), DM2, lacunar infarction, PAD, heart murmur, diabetic retinopathy (treated with shots)  PAIN:  Are you having  pain? 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                                                                                                                            TREATMENT Theract: -completed 1-3 of sink HEP at the ballet bars with intermittent UE support   -hip width BOS  -shorter limit of stability to the L noted and discussed implications with patient  -completed #4-6 with consistent B UE support  -x8 sit <> stand no UE with emphasis on equal weight distribution and power in B LE (currently 70/30 split, per patient report, to R LE)   PATIENT EDUCATION: Education details: sink HEP (see below) Person educated: Patient Education method: Explanation, Demonstration, and Handouts Education comprehension: verbalized understanding and needs further education  HOME EXERCISE PROGRAM: Access Code: [email or web address] URL: [email or web address] Date: 05/09/2024 Prepared by: Marilou Showman  Exercises - Sidelying Hip Abduction wtih Flexion and Extension (BKA)  - 1 x daily - 7 x weekly - 3 sets - 10 reps - Sidelying Hip Abduction (AKA)  - 1 x daily - 7 x weekly - 3 sets - 10 reps - Sidelying Hip Circles (AKA)  - 1 x daily - 7 x weekly - 3 sets - 10 reps - Supine Psoas Stretch (AKA)  - 1 x daily - 7 x weekly - 3 sets - 10 reps  AT [facility] FIND YOUR MIDLINE POSITION AND PLACE FEET EQUAL DISTANCE FROM THE MIDLINE.  Try to find this position when standing still for activities.    USE TAPE ON FLOOR TO MARK THE MIDLINE POSITION. You also should try to feel with your limb pressure in socket.  You are trying to feel with limb what you used to feel with the bottom of your foot.   Side to Side Shift: Moving your hips only (not shoulders): move weight onto your  left leg, HOLD/FEEL.  Move back to equal weight on each leg, HOLD/FEEL. Move weight onto your right leg, HOLD/FEEL. Move back to equal weight on each leg, HOLD/FEEL. Repeat.  Start with both hands on sink, progress to right hand only, then no hands.  Front to Back Shift: Moving your hips only (not shoulders): move your weight forward onto your toes, HOLD/FEEL. Move your weight back to equal Flat Foot on both legs, HOLD/FEEL. Move your weight back onto  your heels, HOLD/FEEL. Move your weight back to equal on both legs, HOLD/FEEL. Repeat.   tart with both hands on sink, progress to right hand only, then no hands. Moving Cones / Cups: With equal weight on each leg: Hold on with one hand the first time, then progress to no hand supports. Move cups from one side of sink to the other. Place cups ~2" out of your reach, progress to 10" beyond reach.  Place one hand in middle of sink and reach with other hand. Do both arms.  Then hover one hand and move cups with other hand. -just use a crossbody reach to end of safe weight shift ROM Overhead/Upward Reaching: alternated reaching up to top cabinets or ceiling if no cabinets present. Keep equal weight on each leg. Start with one hand support on counter while other hand reaches and progress to no hand support with reaching.  ace one hand in middle of sink and reach with other hand. Do both arms.  Then hover one hand and move cups with other hand.  5.   Looking Over Shoulders: With equal weight on each leg: alternate turning to look over your shoulders with one hand support on counter as needed.  Start with head motions only to look in front of shoulder, then even with shoulder and progress to looking behind you. To look to side, move head /eyes, then shoulder on side looking pulls back, shift more weight to side looking and pull hip back. ace one hand in middle of sink and let go with other hand so your shoulder can pull back. Switch hands to look other way.   Then hover  one hand and move cups with other hand.  6.  Stepping with leg that is not amputated:  Move items under cabinet out of your way. Shift your hips/pelvis so weight on prosthesis. SLOWLY step other leg so front of foot is in cabinet. Then step back to floor. - just hike the hip and place the foot back in the starting spot  ASSESSMENT:  CLINICAL IMPRESSION: Patient seen for skilled PT session with emphasis on initiating sink HEP to progress toward equal weight distribution and weight shift. Patient with understandably shorter limit of stability to the L. Limited in A/P weight shift as well given type of knee joint patient currently has. Discussed functional implications of limits of stability and carryover to gait mechanics (step-to vs step- through gait pattern). Continue POC.   OBJECTIVE IMPAIRMENTS: Abnormal gait, decreased activity tolerance, decreased balance, decreased endurance, decreased knowledge of use of DME, decreased mobility, difficulty walking, decreased strength, decreased safety awareness, impaired vision/preception, improper body mechanics, postural dysfunction, and prosthetic dependency .   ACTIVITY LIMITATIONS: carrying, lifting, bending, standing, squatting, stairs, transfers, and locomotion level  PARTICIPATION LIMITATIONS: community activity, occupation, and yard work  PERSONAL FACTORS: Past/current experiences, Time since onset of injury/illness/exacerbation, and 3+ comorbidities: lacunar infarction, HTN, diabetic retinopathy are also affecting patient's functional outcome.   REHAB POTENTIAL: Good  CLINICAL DECISION MAKING: Evolving/moderate complexity  EVALUATION COMPLEXITY: Moderate   GOALS: Goals reviewed with patient? Yes  SHORT TERM GOALS: Target date: 06/06/2024  Pt will be independent and compliant with introductory prosthetic management, strengthening and standing balance HEP in order to maintain functional progress and improve mobility. Baseline:  Initiated on  eval Goal status: INITIAL  2.  Pt will be independent in sock ply adjustment in order to improve wear tolerance and prosthetic fit. Baseline: unaware of sock ply and how to appropriately adjust  for fit on eval Goal status: INITIAL  3.  Pt will decrease 5xSTS to </=12 seconds w/ no more than single UE support and minimum momentum to initiate transfer in order to demonstrate decreased risk for falls and improved functional bilateral LE strength and power. Baseline:  12.75 sec w/ BUE and moderate use of momentum to initiate stand (6/3) Goal status: INITIAL  4.  Pt will demonstrate a gait speed of >/=1.54 feet/sec in order to decrease risk for falls. Baseline: 1.34 ft/sec w/ 2WW SBA (6/3) Goal status: INITIAL  5.  Pt will improve AMPPRO to >/=32/47 in order to demonstrate improved functional capacity on current prosthetic componentry. Baseline: 27/47 (6/3) Goal status: INITIAL  LONG TERM GOALS: Target date: 07/04/2024  Pt will be independent and compliant with advanced prosthetic management, strengthening and standing balance HEP in order to maintain functional progress and improve mobility. Baseline: To be progressed. Goal status: INITIAL  2.   5xSTS to be assessed w/ LTG set as appropriate. Baseline: Assessed 6/3 - no LTG needed at this time. Goal status: REVISED - D/C'd  3.  Pt will demonstrate a gait speed of >/=1.74 feet/sec in order to decrease risk for falls. Baseline: 1.34 ft/sec w/ 2WW SBA (6/3) Goal status: INITIAL  4.  Pt will improve AMPPRO to >/=37/47 in order to demonstrate improved functional capacity on current prosthetic componentry. Baseline: 27/47 (6/3) Goal status: INITIAL  5.  Pt will navigate 4 stairs using step-to pattern and bilateral rails at mod I level to demonstrate safety in home environment. Baseline: Pt reports ind at home Goal status: INITIAL  6.  Pt will initiate ambulation in clinic >/=50 ft with least restrictive cane option in order to  demonstrate improved upright stability and prosthetic management. Baseline: Pt attempts a few feet with cane in home only Goal status: INITIAL   PLAN:  PT FREQUENCY: 2x/week  PT DURATION: 8 weeks  PLANNED INTERVENTIONS: 97164- PT Re-evaluation, 97750- Physical Performance Testing, 97110-Therapeutic exercises, 97530- Therapeutic activity, [id number]- Neuromuscular re-education, 97535- Self Care, [id number]- Manual therapy, [id number]- Gait training, [phone]- Prosthetic Initial , [phone]- Electrical stimulation (manual), Patient/Family education, Balance training, Stair training, Vestibular training, and DME instructions  PLAN FOR NEXT SESSION: Single leg bridges, work towards cane, progress HEP - standing balance/sink HEP?  Sock ply adjustment.  Determine benefit from loftstrands vs 2WW - AD order (holding on standard walker order request)?  Did he follow-up with [employer] regarding water /sand protection of current prosthetic?  If progressing on AMPPRO at [facility] - contact [employer] regarding possible prosthetic changes.  Rebecca Campus, PT Rebecca Campus, PT, DPT, CBIS  05/20/2024, 1:58 PM

## 2024-05-23 ENCOUNTER — Ambulatory Visit

## 2024-05-23 VITALS — BP 178/81 | HR 80

## 2024-05-23 DIAGNOSIS — R2689 Other abnormalities of gait and mobility: Secondary | ICD-10-CM

## 2024-05-23 DIAGNOSIS — M6281 Muscle weakness (generalized): Secondary | ICD-10-CM

## 2024-05-23 DIAGNOSIS — R269 Unspecified abnormalities of gait and mobility: Secondary | ICD-10-CM

## 2024-05-23 DIAGNOSIS — R2681 Unsteadiness on feet: Secondary | ICD-10-CM

## 2024-05-23 NOTE — Therapy (Signed)
 OUTPATIENT PHYSICAL THERAPY PROSTHETICS TREATMENT   Patient Name: Jeremy Padilla MRN: [id number] DOB:[birth year], 58 y.o., male Today's Date: 05/23/2024  PCP: Alica Antu, NP REFERRING PROVIDER: Alica Antu, NP  END OF SESSION:  PT End of Session - 05/23/24 1305     Visit Number 4    Number of Visits 17    Date for PT Re-Evaluation 07/18/24    Authorization Type Medicare A & B with secondary Medicaid    Progress Note Due on Visit 10    PT Start Time 1314    PT Stop Time 1400    PT Time Calculation (min) 46 min    Equipment Utilized During Treatment Gait belt    Activity Tolerance Patient tolerated treatment well    Behavior During Therapy WFL for tasks assessed/performed          Past Medical History:  Diagnosis Date   Abscess 10/05/2015   Diabetes mellitus without complication (HCC)    Heart murmur    Herpes exposure    Hyperlipidemia    Hypertension    Past Surgical History:  Procedure Laterality Date   AMPUTATION Left 06/23/2020   Procedure: AMPUTATION ABOVE KNEE;  Surgeon: Celso College, MD;  Location: [facility];  Service: General;  Laterality: Left;   LOWER EXTREMITY ANGIOGRAPHY Left 04/09/2019   Procedure: LOWER EXTREMITY ANGIOGRAPHY;  Surgeon: Celso College, MD;  Location: [facility];  Service: Cardiovascular;  Laterality: Left;   LOWER EXTREMITY ANGIOGRAPHY Left 04/19/2020   Procedure: Lower Extremity Angiography;  Surgeon: Celso College, MD;  Location: [facility];  Service: Cardiovascular;  Laterality: Left;   LOWER EXTREMITY ANGIOGRAPHY Left 04/20/2020   Procedure: Lower Extremity Angiography;  Surgeon: Celso College, MD;  Location: [facility];  Service: Cardiovascular;  Laterality: Left;   LOWER EXTREMITY INTERVENTION N/A 05/30/2019   Procedure: LOWER EXTREMITY INTERVENTION;  Surgeon: Jackquelyn Mass, MD;  Location: [facility];  Service: Cardiovascular;  Laterality: N/A;   PENILE PROSTHESIS IMPLANT N/A  02/05/2024   Procedure: INSERTION OF INFLATABLE PENILE PROSTHESIS;  Surgeon: Mallie Seal, MD;  Location: [facility];  Service: Urology;  Laterality: N/A;  135 MINUTES NEEDED FOR CASE   WRIST SURGERY Left    Patient Active Problem List   Diagnosis Date Noted   Skin abrasion 01/29/2024   Edema of toe 08/20/2023   Toe infection 08/20/2023   Diabetic polyneuropathy associated with diabetes mellitus due to underlying condition (HCC) 05/18/2023   Benign mole 03/20/2023   Mixed hyperlipidemia 03/20/2023   Hx of AKA (above knee amputation), left (HCC) 09/10/2020   Protein-calorie malnutrition, severe 06/23/2020   Pressure injury of skin 06/22/2020   BKA stump complication (HCC) 06/21/2020   Hyponatremia 06/21/2020   Dehydration 06/21/2020   Ischemic leg 04/20/2020   Ischemia 04/16/2020   Ischemia of left lower extremity 05/30/2019   PAD (peripheral artery disease) (HCC) 04/08/2019   Lacunar infarction (HCC) 08/03/2017   Bruit of left carotid artery 08/03/2017   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) 08/02/2017   Abscess or cellulitis, neck 10/08/2015   Hyperglycemia 10/08/2015   Hypertension goal BP (blood pressure) < 140/90 10/08/2015   Heart murmur 10/08/2015    ONSET DATE: July 2021 (initial amputation)  REFERRING DIAG: I69.629 (ICD-10-CM) - Hx of AKA (above knee amputation), left (HCC) I73.9 (ICD-10-CM) - PAD (peripheral artery disease) (HCC)  THERAPY DIAG:  Other abnormalities of gait and mobility  Unsteadiness on feet  Muscle weakness (generalized)  Abnormality of gait and mobility  Rationale for Evaluation and Treatment: Rehabilitation  SUBJECTIVE:   SUBJECTIVE STATEMENT: Patient reports doing well. Walking with 2WW. 2ply socks on.  Pt accompanied by: self (drives himself)  PERTINENT HISTORY: HTN (goal <140/90), DM2, lacunar infarction, PAD, heart murmur, diabetic retinopathy (treated with shots)  PAIN:  Are you having  pain? 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   PATIENT GOALS: to walk better and maybe get a better prosthetic, to be more independent                                                                                                                            TREATMENT Theract: -education re: safe BP parameters and f/u with PCP/cards -R posterior chain strengthening with U LE bridges  -prone L hip extension   Gait: -62' with RW + CGA with emphasis on longer R step length   -seated rest to assess VS (stable)  -115' RW + CGA with emphasis on longer R step length   -possibly impacted by tight L hip flexors and/or tight R HS -pre gait: anterior step + weight shift to target with B UE support on RW  -progressed to black band resistance  -carryover to overground gait with longer step length noted    PATIENT EDUCATION: Education details: continue HEP, Animator Person educated: Patient Education method: Explanation, Demonstration, and Handouts Education comprehension: verbalized understanding and needs further education  HOME EXERCISE PROGRAM: Access Code: [email or web address] URL: [email or web address] Date: 05/09/2024 Prepared by: Marilou Showman  Exercises - Sidelying Hip Abduction wtih Flexion and Extension (BKA)  - 1 x daily - 7 x weekly - 3 sets - 10 reps - Sidelying Hip Abduction (AKA)  - 1 x daily - 7 x weekly - 3 sets - 10 reps - Sidelying Hip Circles (AKA)  - 1 x daily - 7 x weekly - 3 sets - 10 reps - Supine Psoas Stretch (AKA)  - 1 x daily - 7 x weekly - 3 sets - 10 reps  AT [facility] FIND YOUR MIDLINE POSITION AND PLACE FEET EQUAL DISTANCE FROM THE MIDLINE.  Try to find this position when standing still for activities.    USE TAPE ON FLOOR TO MARK THE MIDLINE POSITION. You also should try to feel with your limb pressure in socket.  You are trying to feel with limb what you used to feel with the bottom of your foot.    Side to Side Shift: Moving your hips only (not shoulders): move weight onto your left leg, HOLD/FEEL.  Move back to equal weight on each leg, HOLD/FEEL. Move weight onto your right leg, HOLD/FEEL. Move back to equal weight on each leg, HOLD/FEEL. Repeat.  Start with both hands on sink, progress to right hand only, then no hands.  Front to Back Shift: Moving your hips only (not shoulders): move your weight forward onto your toes,  HOLD/FEEL. Move your weight back to equal Flat Foot on both legs, HOLD/FEEL. Move your weight back onto your heels, HOLD/FEEL. Move your weight back to equal on both legs, HOLD/FEEL. Repeat.   tart with both hands on sink, progress to right hand only, then no hands. Moving Cones / Cups: With equal weight on each leg: Hold on with one hand the first time, then progress to no hand supports. Move cups from one side of sink to the other. Place cups ~2" out of your reach, progress to 10" beyond reach.  Place one hand in middle of sink and reach with other hand. Do both arms.  Then hover one hand and move cups with other hand. -just use a crossbody reach to end of safe weight shift ROM Overhead/Upward Reaching: alternated reaching up to top cabinets or ceiling if no cabinets present. Keep equal weight on each leg. Start with one hand support on counter while other hand reaches and progress to no hand support with reaching.  ace one hand in middle of sink and reach with other hand. Do both arms.  Then hover one hand and move cups with other hand.  5.   Looking Over Shoulders: With equal weight on each leg: alternate turning to look over your shoulders with one hand support on counter as needed.  Start with head motions only to look in front of shoulder, then even with shoulder and progress to looking behind you. To look to side, move head /eyes, then shoulder on side looking pulls back, shift more weight to side looking and pull hip back. ace one hand in middle of sink and let go with other  hand so your shoulder can pull back. Switch hands to look other way.   Then hover one hand and move cups with other hand.  6.  Stepping with leg that is not amputated:  Move items under cabinet out of your way. Shift your hips/pelvis so weight on prosthesis. SLOWLY step other leg so front of foot is in cabinet. Then step back to floor. - just hike the hip and place the foot back in the starting spot  ASSESSMENT:  CLINICAL IMPRESSION: Patient seen for skilled PT session with emphasis on progressing gait mechanics with RW. Good carryover from pre-gait tasks to overground gait noted with intermittent verbal cues for upright posture and decreased reliance on RW. Continue POC.   OBJECTIVE IMPAIRMENTS: Abnormal gait, decreased activity tolerance, decreased balance, decreased endurance, decreased knowledge of use of DME, decreased mobility, difficulty walking, decreased strength, decreased safety awareness, impaired vision/preception, improper body mechanics, postural dysfunction, and prosthetic dependency .   ACTIVITY LIMITATIONS: carrying, lifting, bending, standing, squatting, stairs, transfers, and locomotion level  PARTICIPATION LIMITATIONS: community activity, occupation, and yard work  PERSONAL FACTORS: Past/current experiences, Time since onset of injury/illness/exacerbation, and 3+ comorbidities: lacunar infarction, HTN, diabetic retinopathy are also affecting patient's functional outcome.   REHAB POTENTIAL: Good  CLINICAL DECISION MAKING: Evolving/moderate complexity  EVALUATION COMPLEXITY: Moderate   GOALS: Goals reviewed with patient? Yes  SHORT TERM GOALS: Target date: 06/06/2024  Pt will be independent and compliant with introductory prosthetic management, strengthening and standing balance HEP in order to maintain functional progress and improve mobility. Baseline:  Initiated on eval Goal status: INITIAL  2.  Pt will be independent in sock ply adjustment in order to improve  wear tolerance and prosthetic fit. Baseline: unaware of sock ply and how to appropriately adjust for fit on eval Goal status: INITIAL  3.  Pt  will decrease 5xSTS to </=12 seconds w/ no more than single UE support and minimum momentum to initiate transfer in order to demonstrate decreased risk for falls and improved functional bilateral LE strength and power. Baseline:  12.75 sec w/ BUE and moderate use of momentum to initiate stand (6/3) Goal status: INITIAL  4.  Pt will demonstrate a gait speed of >/=1.54 feet/sec in order to decrease risk for falls. Baseline: 1.34 ft/sec w/ 2WW SBA (6/3) Goal status: INITIAL  5.  Pt will improve AMPPRO to >/=32/47 in order to demonstrate improved functional capacity on current prosthetic componentry. Baseline: 27/47 (6/3) Goal status: INITIAL  LONG TERM GOALS: Target date: 07/04/2024  Pt will be independent and compliant with advanced prosthetic management, strengthening and standing balance HEP in order to maintain functional progress and improve mobility. Baseline: To be progressed. Goal status: INITIAL  2.   5xSTS to be assessed w/ LTG set as appropriate. Baseline: Assessed 6/3 - no LTG needed at this time. Goal status: REVISED - D/C'd  3.  Pt will demonstrate a gait speed of >/=1.74 feet/sec in order to decrease risk for falls. Baseline: 1.34 ft/sec w/ 2WW SBA (6/3) Goal status: INITIAL  4.  Pt will improve AMPPRO to >/=37/47 in order to demonstrate improved functional capacity on current prosthetic componentry. Baseline: 27/47 (6/3) Goal status: INITIAL  5.  Pt will navigate 4 stairs using step-to pattern and bilateral rails at mod I level to demonstrate safety in home environment. Baseline: Pt reports ind at home Goal status: INITIAL  6.  Pt will initiate ambulation in clinic >/=50 ft with least restrictive cane option in order to demonstrate improved upright stability and prosthetic management. Baseline: Pt attempts a few feet with cane  in home only Goal status: INITIAL   PLAN:  PT FREQUENCY: 2x/week  PT DURATION: 8 weeks  PLANNED INTERVENTIONS: 97164- PT Re-evaluation, 97750- Physical Performance Testing, 97110-Therapeutic exercises, 97530- Therapeutic activity, [id number]- Neuromuscular re-education, 97535- Self Care, [id number]- Manual therapy, [id number]- Gait training, [phone]- Prosthetic Initial , [phone]- Electrical stimulation (manual), Patient/Family education, Balance training, Stair training, Vestibular training, and DME instructions  PLAN FOR NEXT SESSION: Single leg bridges, work towards cane, progress HEP - standing balance/sink HEP?  Sock ply adjustment.  Determine benefit from loftstrands vs 2WW - AD order (holding on standard walker order request)?  Did he follow-up with [employer] regarding water /sand protection of current prosthetic?  If progressing on AMPPRO at [facility] - contact [employer] regarding possible prosthetic changes.  Rebecca Campus, PT Rebecca Campus, PT, DPT, CBIS  05/23/2024, 2:58 PM

## 2024-05-27 ENCOUNTER — Ambulatory Visit

## 2024-05-27 VITALS — BP 164/92 | HR 81

## 2024-05-27 DIAGNOSIS — R2689 Other abnormalities of gait and mobility: Secondary | ICD-10-CM

## 2024-05-27 DIAGNOSIS — R269 Unspecified abnormalities of gait and mobility: Secondary | ICD-10-CM

## 2024-05-27 DIAGNOSIS — M6281 Muscle weakness (generalized): Secondary | ICD-10-CM

## 2024-05-27 DIAGNOSIS — R2681 Unsteadiness on feet: Secondary | ICD-10-CM

## 2024-05-27 NOTE — Therapy (Signed)
 OUTPATIENT PHYSICAL THERAPY PROSTHETICS TREATMENT   Patient Name: Jeremy Padilla MRN: [id number] DOB:[birth year], 58 y.o., male Today's Date: 05/27/2024  PCP: Alica Antu, NP REFERRING PROVIDER: Alica Antu, NP  END OF SESSION:  PT End of Session - 05/27/24 1320     Visit Number 5    Number of Visits 17    Date for PT Re-Evaluation 07/18/24    Authorization Type Medicare A & B with secondary Medicaid    Progress Note Due on Visit 10    PT Start Time 1317    PT Stop Time 1358    PT Time Calculation (min) 41 min    Equipment Utilized During Treatment Gait belt    Activity Tolerance Patient tolerated treatment well    Behavior During Therapy WFL for tasks assessed/performed          Past Medical History:  Diagnosis Date   Abscess 10/05/2015   Diabetes mellitus without complication (HCC)    Heart murmur    Herpes exposure    Hyperlipidemia    Hypertension    Past Surgical History:  Procedure Laterality Date   AMPUTATION Left 06/23/2020   Procedure: AMPUTATION ABOVE KNEE;  Surgeon: Celso College, MD;  Location: [facility];  Service: General;  Laterality: Left;   LOWER EXTREMITY ANGIOGRAPHY Left 04/09/2019   Procedure: LOWER EXTREMITY ANGIOGRAPHY;  Surgeon: Celso College, MD;  Location: [facility];  Service: Cardiovascular;  Laterality: Left;   LOWER EXTREMITY ANGIOGRAPHY Left 04/19/2020   Procedure: Lower Extremity Angiography;  Surgeon: Celso College, MD;  Location: [facility];  Service: Cardiovascular;  Laterality: Left;   LOWER EXTREMITY ANGIOGRAPHY Left 04/20/2020   Procedure: Lower Extremity Angiography;  Surgeon: Celso College, MD;  Location: [facility];  Service: Cardiovascular;  Laterality: Left;   LOWER EXTREMITY INTERVENTION N/A 05/30/2019   Procedure: LOWER EXTREMITY INTERVENTION;  Surgeon: Jackquelyn Mass, MD;  Location: [facility];  Service: Cardiovascular;  Laterality: N/A;   PENILE PROSTHESIS IMPLANT N/A  02/05/2024   Procedure: INSERTION OF INFLATABLE PENILE PROSTHESIS;  Surgeon: Mallie Seal, MD;  Location: [facility];  Service: Urology;  Laterality: N/A;  135 MINUTES NEEDED FOR CASE   WRIST SURGERY Left    Patient Active Problem List   Diagnosis Date Noted   Skin abrasion 01/29/2024   Edema of toe 08/20/2023   Toe infection 08/20/2023   Diabetic polyneuropathy associated with diabetes mellitus due to underlying condition (HCC) 05/18/2023   Benign mole 03/20/2023   Mixed hyperlipidemia 03/20/2023   Hx of AKA (above knee amputation), left (HCC) 09/10/2020   Protein-calorie malnutrition, severe 06/23/2020   Pressure injury of skin 06/22/2020   BKA stump complication (HCC) 06/21/2020   Hyponatremia 06/21/2020   Dehydration 06/21/2020   Ischemic leg 04/20/2020   Ischemia 04/16/2020   Ischemia of left lower extremity 05/30/2019   PAD (peripheral artery disease) (HCC) 04/08/2019   Lacunar infarction (HCC) 08/03/2017   Bruit of left carotid artery 08/03/2017   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) 08/02/2017   Abscess or cellulitis, neck 10/08/2015   Hyperglycemia 10/08/2015   Hypertension goal BP (blood pressure) < 140/90 10/08/2015   Heart murmur 10/08/2015    ONSET DATE: July 2021 (initial amputation)  REFERRING DIAG: W09.811 (ICD-10-CM) - Hx of AKA (above knee amputation), left (HCC) I73.9 (ICD-10-CM) - PAD (peripheral artery disease) (HCC)  THERAPY DIAG:  Other abnormalities of gait and mobility  Unsteadiness on feet  Muscle weakness (generalized)  Abnormality of gait and mobility  Rationale for Evaluation and Treatment: Rehabilitation  SUBJECTIVE:   SUBJECTIVE STATEMENT: Patient reports doing well. Did talk to NP who added a half a tablet of BP rx as of yesterday. Denies falls. Does have a bloodshot L eye from a tree branch, per patient report.  Pt accompanied by: self (drives himself)  PERTINENT HISTORY: HTN (goal <140/90),  DM2, lacunar infarction, PAD, heart murmur, diabetic retinopathy (treated with shots)  PAIN:  Are you having pain? 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   PATIENT GOALS: to walk better and maybe get a better prosthetic, to be more independent                                                                                                                            TREATMENT Therex:  - UE ergometer 5 mins fwd, 5 mins backward   Theract: -lateral step retrieving squigz to place on mirror for increased weight shift and weight bearing through prosthetic  -ed on BP parameters  PATIENT EDUCATION: Education details: continue HEP, monitor BP Person educated: Patient Education method: Explanation, Demonstration, and Handouts Education comprehension: verbalized understanding and needs further education  HOME EXERCISE PROGRAM: Access Code: [email or web address] URL: [email or web address] Date: 05/09/2024 Prepared by: Marilou Showman  Exercises - Sidelying Hip Abduction wtih Flexion and Extension (BKA)  - 1 x daily - 7 x weekly - 3 sets - 10 reps - Sidelying Hip Abduction (AKA)  - 1 x daily - 7 x weekly - 3 sets - 10 reps - Sidelying Hip Circles (AKA)  - 1 x daily - 7 x weekly - 3 sets - 10 reps - Supine Psoas Stretch (AKA)  - 1 x daily - 7 x weekly - 3 sets - 10 reps  AT [facility] FIND YOUR MIDLINE POSITION AND PLACE FEET EQUAL DISTANCE FROM THE MIDLINE.  Try to find this position when standing still for activities.    USE TAPE ON FLOOR TO MARK THE MIDLINE POSITION. You also should try to feel with your limb pressure in socket.  You are trying to feel with limb what you used to feel with the bottom of your foot.   Side to Side Shift: Moving your hips only (not shoulders): move weight onto your left leg, HOLD/FEEL.  Move back to equal weight on each leg, HOLD/FEEL. Move weight onto your right leg, HOLD/FEEL. Move back to equal weight on each  leg, HOLD/FEEL. Repeat.  Start with both hands on sink, progress to right hand only, then no hands.  Front to Back Shift: Moving your hips only (not shoulders): move your weight forward onto your toes, HOLD/FEEL. Move your weight back to equal Flat Foot on both legs, HOLD/FEEL. Move your weight back onto your heels, HOLD/FEEL. Move your weight back to equal on both legs, HOLD/FEEL. Repeat.   tart with both hands on sink, progress to right  hand only, then no hands. Moving Cones / Cups: With equal weight on each leg: Hold on with one hand the first time, then progress to no hand supports. Move cups from one side of sink to the other. Place cups ~2" out of your reach, progress to 10" beyond reach.  Place one hand in middle of sink and reach with other hand. Do both arms.  Then hover one hand and move cups with other hand. -just use a crossbody reach to end of safe weight shift ROM Overhead/Upward Reaching: alternated reaching up to top cabinets or ceiling if no cabinets present. Keep equal weight on each leg. Start with one hand support on counter while other hand reaches and progress to no hand support with reaching.  ace one hand in middle of sink and reach with other hand. Do both arms.  Then hover one hand and move cups with other hand.  5.   Looking Over Shoulders: With equal weight on each leg: alternate turning to look over your shoulders with one hand support on counter as needed.  Start with head motions only to look in front of shoulder, then even with shoulder and progress to looking behind you. To look to side, move head /eyes, then shoulder on side looking pulls back, shift more weight to side looking and pull hip back. ace one hand in middle of sink and let go with other hand so your shoulder can pull back. Switch hands to look other way.   Then hover one hand and move cups with other hand.  6.  Stepping with leg that is not amputated:  Move items under cabinet out of your way. Shift your hips/pelvis  so weight on prosthesis. SLOWLY step other leg so front of foot is in cabinet. Then step back to floor. - just hike the hip and place the foot back in the starting spot  ASSESSMENT:  CLINICAL IMPRESSION: Patient seen for skilled PT session with emphasis on CV conditioning and progressing dynamic standing balance with prosthetic. Large amplitude step encouraged for carryover to overground gait. PT does note preferred gait pattern of very short and narrow stride length and step width. Appropriate stability with lateral weight shift onto prosthetic noted. Continue POC.   OBJECTIVE IMPAIRMENTS: Abnormal gait, decreased activity tolerance, decreased balance, decreased endurance, decreased knowledge of use of DME, decreased mobility, difficulty walking, decreased strength, decreased safety awareness, impaired vision/preception, improper body mechanics, postural dysfunction, and prosthetic dependency .   ACTIVITY LIMITATIONS: carrying, lifting, bending, standing, squatting, stairs, transfers, and locomotion level  PARTICIPATION LIMITATIONS: community activity, occupation, and yard work  PERSONAL FACTORS: Past/current experiences, Time since onset of injury/illness/exacerbation, and 3+ comorbidities: lacunar infarction, HTN, diabetic retinopathy are also affecting patient's functional outcome.   REHAB POTENTIAL: Good  CLINICAL DECISION MAKING: Evolving/moderate complexity  EVALUATION COMPLEXITY: Moderate   GOALS: Goals reviewed with patient? Yes  SHORT TERM GOALS: Target date: 06/06/2024  Pt will be independent and compliant with introductory prosthetic management, strengthening and standing balance HEP in order to maintain functional progress and improve mobility. Baseline:  Initiated on eval Goal status: INITIAL  2.  Pt will be independent in sock ply adjustment in order to improve wear tolerance and prosthetic fit. Baseline: unaware of sock ply and how to appropriately adjust for fit on  eval Goal status: INITIAL  3.  Pt will decrease 5xSTS to </=12 seconds w/ no more than single UE support and minimum momentum to initiate transfer in order to demonstrate decreased risk for  falls and improved functional bilateral LE strength and power. Baseline:  12.75 sec w/ BUE and moderate use of momentum to initiate stand (6/3) Goal status: INITIAL  4.  Pt will demonstrate a gait speed of >/=1.54 feet/sec in order to decrease risk for falls. Baseline: 1.34 ft/sec w/ 2WW SBA (6/3) Goal status: INITIAL  5.  Pt will improve AMPPRO to >/=32/47 in order to demonstrate improved functional capacity on current prosthetic componentry. Baseline: 27/47 (6/3) Goal status: INITIAL  LONG TERM GOALS: Target date: 07/04/2024  Pt will be independent and compliant with advanced prosthetic management, strengthening and standing balance HEP in order to maintain functional progress and improve mobility. Baseline: To be progressed. Goal status: INITIAL  2.   5xSTS to be assessed w/ LTG set as appropriate. Baseline: Assessed 6/3 - no LTG needed at this time. Goal status: REVISED - D/C'd  3.  Pt will demonstrate a gait speed of >/=1.74 feet/sec in order to decrease risk for falls. Baseline: 1.34 ft/sec w/ 2WW SBA (6/3) Goal status: INITIAL  4.  Pt will improve AMPPRO to >/=37/47 in order to demonstrate improved functional capacity on current prosthetic componentry. Baseline: 27/47 (6/3) Goal status: INITIAL  5.  Pt will navigate 4 stairs using step-to pattern and bilateral rails at mod I level to demonstrate safety in home environment. Baseline: Pt reports ind at home Goal status: INITIAL  6.  Pt will initiate ambulation in clinic >/=50 ft with least restrictive cane option in order to demonstrate improved upright stability and prosthetic management. Baseline: Pt attempts a few feet with cane in home only Goal status: INITIAL   PLAN:  PT FREQUENCY: 2x/week  PT DURATION: 8 weeks  PLANNED  INTERVENTIONS: [id number]- PT Re-evaluation, 97750- Physical Performance Testing, 97110-Therapeutic exercises, 97530- Therapeutic activity, [id number]- Neuromuscular re-education, 97535- Self Care, [id number]- Manual therapy, [id number]- Gait training, [phone]- Prosthetic Initial , [phone]- Electrical stimulation (manual), Patient/Family education, Balance training, Stair training, Vestibular training, and DME instructions  PLAN FOR NEXT SESSION: Single leg bridges, work towards cane, progress HEP - standing balance/sink HEP?  Sock ply adjustment.  Determine benefit from loftstrands vs 2WW - AD order (holding on standard walker order request)?  Did he follow-up with [employer] regarding water /sand protection of current prosthetic?  If progressing on AMPPRO at [facility] - contact [employer] regarding possible prosthetic changes.  Rebecca Campus, PT Rebecca Campus, PT, DPT, CBIS  05/27/2024, 2:20 PM

## 2024-05-30 ENCOUNTER — Other Ambulatory Visit: Payer: Self-pay | Admitting: Cardiology

## 2024-05-30 ENCOUNTER — Telehealth: Payer: Self-pay

## 2024-05-30 ENCOUNTER — Ambulatory Visit

## 2024-05-30 VITALS — BP 160/70 | HR 57

## 2024-05-30 DIAGNOSIS — R2681 Unsteadiness on feet: Secondary | ICD-10-CM

## 2024-05-30 DIAGNOSIS — R2689 Other abnormalities of gait and mobility: Secondary | ICD-10-CM

## 2024-05-30 DIAGNOSIS — R269 Unspecified abnormalities of gait and mobility: Secondary | ICD-10-CM

## 2024-05-30 DIAGNOSIS — M6281 Muscle weakness (generalized): Secondary | ICD-10-CM

## 2024-05-30 MED ORDER — HYDROXYZINE HCL 25 MG PO TABS: 50.0000 mg | ORAL_TABLET | Freq: Four times a day (QID) | ORAL | 4 refills | Status: DC | PRN

## 2024-05-30 NOTE — Therapy (Signed)
 OUTPATIENT PHYSICAL THERAPY PROSTHETICS TREATMENT   Patient Name: Jeremy Padilla MRN: [id number] DOB:March 06, 1966, 58 y.o., male Today's Date: 05/30/2024  PCP: Alica Antu, NP REFERRING PROVIDER: Alica Antu, NP  END OF SESSION:  PT End of Session - 05/30/24 1252     Visit Number 6    Number of Visits 17    Date for PT Re-Evaluation 07/18/24    Authorization Type Medicare A & B with secondary Medicaid    Progress Note Due on Visit 10    PT Start Time 1315    PT Stop Time 1354    PT Time Calculation (min) 39 min    Activity Tolerance Patient tolerated treatment well    Behavior During Therapy [facility] for tasks assessed/performed          Past Medical History:  Diagnosis Date   Abscess 10/05/2015   Diabetes mellitus without complication (HCC)    Heart murmur    Herpes exposure    Hyperlipidemia    Hypertension    Past Surgical History:  Procedure Laterality Date   AMPUTATION Left 06/23/2020   Procedure: AMPUTATION ABOVE KNEE;  Surgeon: Celso College, MD;  Location: [facility];  Service: General;  Laterality: Left;   LOWER EXTREMITY ANGIOGRAPHY Left 04/09/2019   Procedure: LOWER EXTREMITY ANGIOGRAPHY;  Surgeon: Celso College, MD;  Location: [facility];  Service: Cardiovascular;  Laterality: Left;   LOWER EXTREMITY ANGIOGRAPHY Left 04/19/2020   Procedure: Lower Extremity Angiography;  Surgeon: Celso College, MD;  Location: [facility];  Service: Cardiovascular;  Laterality: Left;   LOWER EXTREMITY ANGIOGRAPHY Left 04/20/2020   Procedure: Lower Extremity Angiography;  Surgeon: Celso College, MD;  Location: [facility];  Service: Cardiovascular;  Laterality: Left;   LOWER EXTREMITY INTERVENTION N/A 05/30/2019   Procedure: LOWER EXTREMITY INTERVENTION;  Surgeon: Jackquelyn Mass, MD;  Location: [facility];  Service: Cardiovascular;  Laterality: N/A;   PENILE PROSTHESIS IMPLANT N/A 02/05/2024   Procedure: INSERTION OF INFLATABLE PENILE  PROSTHESIS;  Surgeon: Mallie Seal, MD;  Location: [facility];  Service: Urology;  Laterality: N/A;  135 MINUTES NEEDED FOR CASE   WRIST SURGERY Left    Patient Active Problem List   Diagnosis Date Noted   Skin abrasion 01/29/2024   Edema of toe 08/20/2023   Toe infection 08/20/2023   Diabetic polyneuropathy associated with diabetes mellitus due to underlying condition (HCC) 05/18/2023   Benign mole 03/20/2023   Mixed hyperlipidemia 03/20/2023   Hx of AKA (above knee amputation), left (HCC) 09/10/2020   Protein-calorie malnutrition, severe 06/23/2020   Pressure injury of skin 06/22/2020   BKA stump complication (HCC) 06/21/2020   Hyponatremia 06/21/2020   Dehydration 06/21/2020   Ischemic leg 04/20/2020   Ischemia 04/16/2020   Ischemia of left lower extremity 05/30/2019   PAD (peripheral artery disease) (HCC) 04/08/2019   Lacunar infarction (HCC) 08/03/2017   Bruit of left carotid artery 08/03/2017   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) 08/02/2017   Abscess or cellulitis, neck 10/08/2015   Hyperglycemia 10/08/2015   Hypertension goal BP (blood pressure) < 140/90 10/08/2015   Heart murmur 10/08/2015    ONSET DATE: July 2021 (initial amputation)  REFERRING DIAG: W09.811 (ICD-10-CM) - Hx of AKA (above knee amputation), left (HCC) I73.9 (ICD-10-CM) - PAD (peripheral artery disease) (HCC)  THERAPY DIAG:  Other abnormalities of gait and mobility  Unsteadiness on feet  Muscle weakness (generalized)  Abnormality of gait and  mobility  Rationale for Evaluation and Treatment: Rehabilitation  SUBJECTIVE:   SUBJECTIVE STATEMENT: Patient reports doing fair. Does have a rash on R medial posterior forearm, he reports it's poison oak/ivy. Denies falls.  Pt accompanied by: self (drives himself)  PERTINENT HISTORY: HTN (goal <140/90), DM2, lacunar infarction, PAD, heart murmur, diabetic retinopathy (treated with shots)  PAIN:  Are you  having pain? 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   PATIENT GOALS: to walk better and maybe get a better prosthetic, to be more independent                                                                                                                            TREATMENT Therex:  - UE ergometer hills level 2, 5 mins fwd, 5 mins backward  -hooklying bridges with emphasis on eccentric lowering -sidelying hip abduction with emphasis on neutral pelvic positioning  -prone hip extension -standing 3lb DB punch  -shoulder press  -trunk twist + punch   PATIENT EDUCATION: Education details: continue HEP, monitor BP Person educated: Patient Education method: Explanation, Demonstration, and Handouts Education comprehension: verbalized understanding and needs further education  HOME EXERCISE PROGRAM: Access Code: [email or web address] URL: [email or web address] Date: 05/09/2024 Prepared by: Marilou Showman  Exercises - Sidelying Hip Abduction wtih Flexion and Extension (BKA)  - 1 x daily - 7 x weekly - 3 sets - 10 reps - Sidelying Hip Abduction (AKA)  - 1 x daily - 7 x weekly - 3 sets - 10 reps - Sidelying Hip Circles (AKA)  - 1 x daily - 7 x weekly - 3 sets - 10 reps - Supine Psoas Stretch (AKA)  - 1 x daily - 7 x weekly - 3 sets - 10 reps  AT [facility] FIND YOUR MIDLINE POSITION AND PLACE FEET EQUAL DISTANCE FROM THE MIDLINE.  Try to find this position when standing still for activities.    USE TAPE ON FLOOR TO MARK THE MIDLINE POSITION. You also should try to feel with your limb pressure in socket.  You are trying to feel with limb what you used to feel with the bottom of your foot.   Side to Side Shift: Moving your hips only (not shoulders): move weight onto your left leg, HOLD/FEEL.  Move back to equal weight on each leg, HOLD/FEEL. Move weight onto your right leg, HOLD/FEEL. Move back to equal weight on each leg, HOLD/FEEL. Repeat.   Start with both hands on sink, progress to right hand only, then no hands.  Front to Back Shift: Moving your hips only (not shoulders): move your weight forward onto your toes, HOLD/FEEL. Move your weight back to equal Flat Foot on both legs, HOLD/FEEL. Move your weight back onto your heels, HOLD/FEEL. Move your weight back to equal on both legs, HOLD/FEEL. Repeat.   tart with both hands on sink, progress to right hand only, then no hands. Moving Cones / Cups: With equal  weight on each leg: Hold on with one hand the first time, then progress to no hand supports. Move cups from one side of sink to the other. Place cups ~2" out of your reach, progress to 10" beyond reach.  Place one hand in middle of sink and reach with other hand. Do both arms.  Then hover one hand and move cups with other hand. -just use a crossbody reach to end of safe weight shift ROM Overhead/Upward Reaching: alternated reaching up to top cabinets or ceiling if no cabinets present. Keep equal weight on each leg. Start with one hand support on counter while other hand reaches and progress to no hand support with reaching.  ace one hand in middle of sink and reach with other hand. Do both arms.  Then hover one hand and move cups with other hand.  5.   Looking Over Shoulders: With equal weight on each leg: alternate turning to look over your shoulders with one hand support on counter as needed.  Start with head motions only to look in front of shoulder, then even with shoulder and progress to looking behind you. To look to side, move head /eyes, then shoulder on side looking pulls back, shift more weight to side looking and pull hip back. ace one hand in middle of sink and let go with other hand so your shoulder can pull back. Switch hands to look other way.   Then hover one hand and move cups with other hand.  6.  Stepping with leg that is not amputated:  Move items under cabinet out of your way. Shift your hips/pelvis so weight on prosthesis.  SLOWLY step other leg so front of foot is in cabinet. Then step back to floor. - just hike the hip and place the foot back in the starting spot  ASSESSMENT:  CLINICAL IMPRESSION: Patient seen for skilled PT session with emphasis on global strengthening. Since he has increased his BP rx, his diastolic BP is slightly more labile, but he remains asymptomatic. Challenged by proximal LE strengthening in appropriate alignment with preference to compensate with lower trunk or hip flexors. He would benefit from further hip abd and extensor strengthening. Continue POC.   OBJECTIVE IMPAIRMENTS: Abnormal gait, decreased activity tolerance, decreased balance, decreased endurance, decreased knowledge of use of DME, decreased mobility, difficulty walking, decreased strength, decreased safety awareness, impaired vision/preception, improper body mechanics, postural dysfunction, and prosthetic dependency .   ACTIVITY LIMITATIONS: carrying, lifting, bending, standing, squatting, stairs, transfers, and locomotion level  PARTICIPATION LIMITATIONS: community activity, occupation, and yard work  PERSONAL FACTORS: Past/current experiences, Time since onset of injury/illness/exacerbation, and 3+ comorbidities: lacunar infarction, HTN, diabetic retinopathy are also affecting patient's functional outcome.   REHAB POTENTIAL: Good  CLINICAL DECISION MAKING: Evolving/moderate complexity  EVALUATION COMPLEXITY: Moderate   GOALS: Goals reviewed with patient? Yes  SHORT TERM GOALS: Target date: 06/06/2024  Pt will be independent and compliant with introductory prosthetic management, strengthening and standing balance HEP in order to maintain functional progress and improve mobility. Baseline:  Initiated on eval Goal status: INITIAL  2.  Pt will be independent in sock ply adjustment in order to improve wear tolerance and prosthetic fit. Baseline: unaware of sock ply and how to appropriately adjust for fit on  eval Goal status: INITIAL  3.  Pt will decrease 5xSTS to </=12 seconds w/ no more than single UE support and minimum momentum to initiate transfer in order to demonstrate decreased risk for falls and improved functional bilateral LE  strength and power. Baseline:  12.75 sec w/ BUE and moderate use of momentum to initiate stand (6/3) Goal status: INITIAL  4.  Pt will demonstrate a gait speed of >/=1.54 feet/sec in order to decrease risk for falls. Baseline: 1.34 ft/sec w/ 2WW SBA (6/3) Goal status: INITIAL  5.  Pt will improve AMPPRO to >/=32/47 in order to demonstrate improved functional capacity on current prosthetic componentry. Baseline: 27/47 (6/3) Goal status: INITIAL  LONG TERM GOALS: Target date: 07/04/2024  Pt will be independent and compliant with advanced prosthetic management, strengthening and standing balance HEP in order to maintain functional progress and improve mobility. Baseline: To be progressed. Goal status: INITIAL  2.   5xSTS to be assessed w/ LTG set as appropriate. Baseline: Assessed 6/3 - no LTG needed at this time. Goal status: REVISED - D/C'd  3.  Pt will demonstrate a gait speed of >/=1.74 feet/sec in order to decrease risk for falls. Baseline: 1.34 ft/sec w/ 2WW SBA (6/3) Goal status: INITIAL  4.  Pt will improve AMPPRO to >/=37/47 in order to demonstrate improved functional capacity on current prosthetic componentry. Baseline: 27/47 (6/3) Goal status: INITIAL  5.  Pt will navigate 4 stairs using step-to pattern and bilateral rails at mod I level to demonstrate safety in home environment. Baseline: Pt reports ind at home Goal status: INITIAL  6.  Pt will initiate ambulation in clinic >/=50 ft with least restrictive cane option in order to demonstrate improved upright stability and prosthetic management. Baseline: Pt attempts a few feet with cane in home only Goal status: INITIAL   PLAN:  PT FREQUENCY: 2x/week  PT DURATION: 8 weeks  PLANNED  INTERVENTIONS: 97164- PT Re-evaluation, 97750- Physical Performance Testing, 97110-Therapeutic exercises, 97530- Therapeutic activity, [id number]- Neuromuscular re-education, 97535- Self Care, [id number]- Manual therapy, [id number]- Gait training, [phone]- Prosthetic Initial , [phone]- Electrical stimulation (manual), Patient/Family education, Balance training, Stair training, Vestibular training, and DME instructions  PLAN FOR NEXT SESSION: CHECK STG. Single leg bridges, work towards cane, progress HEP - standing balance/sink HEP?  Sock ply adjustment.  Determine benefit from loftstrands vs 2WW - AD order (holding on standard walker order request)?  Did he follow-up with [employer] regarding water /sand protection of current prosthetic?  If progressing on AMPPRO at [facility] - contact [employer] regarding possible prosthetic changes.  Rebecca Campus, PT Rebecca Campus, PT, DPT, CBIS  05/30/2024, 2:02 PM

## 2024-05-30 NOTE — Telephone Encounter (Signed)
 Patient has poison oak and is requesting hydroxyzine to be sent to [facility] for him

## 2024-06-03 ENCOUNTER — Telehealth: Payer: Self-pay

## 2024-06-03 NOTE — Telephone Encounter (Signed)
 Patient called stating that the poison oak is spreading to his face and arms, do you need him to make an appt or just a nurse visit for a depo 80mg  shot?

## 2024-06-04 ENCOUNTER — Ambulatory Visit: Admitting: Physical Therapy

## 2024-06-04 ENCOUNTER — Ambulatory Visit (INDEPENDENT_AMBULATORY_CARE_PROVIDER_SITE_OTHER)

## 2024-06-04 DIAGNOSIS — L258 Unspecified contact dermatitis due to other agents: Secondary | ICD-10-CM | POA: Diagnosis not present

## 2024-06-04 MED ORDER — METHYLPREDNISOLONE ACETATE 40 MG/ML IJ SUSP
40.0000 mg | Freq: Once | INTRAMUSCULAR | Status: AC
  Administered 2024-06-04: 40 mg via INTRAMUSCULAR

## 2024-06-06 ENCOUNTER — Other Ambulatory Visit: Payer: Self-pay | Admitting: Cardiology

## 2024-06-06 ENCOUNTER — Encounter: Admitting: Physical Therapy

## 2024-06-06 ENCOUNTER — Telehealth: Payer: Self-pay

## 2024-06-06 MED ORDER — PREDNISONE 20 MG PO TABS: ORAL_TABLET | ORAL | 0 refills | Status: DC

## 2024-06-06 NOTE — Telephone Encounter (Signed)
 Patient called stating that he came in Wednesday for shot for poison ivy and its cleared up on arms, but his face, back, & neck is still very irritated and was advised to call back if it wasn't getting any better and we would send in a RX for him, [facility]

## 2024-06-06 NOTE — Progress Notes (Signed)
 SABRA

## 2024-06-10 ENCOUNTER — Ambulatory Visit: Attending: Cardiology | Admitting: Physical Therapy

## 2024-06-10 ENCOUNTER — Encounter: Payer: Self-pay | Admitting: Physical Therapy

## 2024-06-10 VITALS — BP 151/79 | HR 74

## 2024-06-10 DIAGNOSIS — R2681 Unsteadiness on feet: Secondary | ICD-10-CM | POA: Diagnosis present

## 2024-06-10 DIAGNOSIS — R2689 Other abnormalities of gait and mobility: Secondary | ICD-10-CM | POA: Insufficient documentation

## 2024-06-10 DIAGNOSIS — M6281 Muscle weakness (generalized): Secondary | ICD-10-CM | POA: Insufficient documentation

## 2024-06-10 DIAGNOSIS — R269 Unspecified abnormalities of gait and mobility: Secondary | ICD-10-CM | POA: Insufficient documentation

## 2024-06-10 NOTE — Therapy (Signed)
 OUTPATIENT PHYSICAL THERAPY PROSTHETICS TREATMENT   Patient Name: Jeremy Padilla MRN: [id number] DOB:[birth year], 58 y.o., male Today's Date: 06/10/2024  PCP: Carin Gauze, NP REFERRING PROVIDER: Carin Gauze, NP  END OF SESSION:  PT End of Session - 06/10/24 1305     Visit Number 7    Number of Visits 17    Date for PT Re-Evaluation 07/18/24    Authorization Type Medicare A & B with secondary Medicaid    Progress Note Due on Visit 10    PT Start Time 1315    PT Stop Time 1400    PT Time Calculation (min) 45 min    Equipment Utilized During Treatment Gait belt    Activity Tolerance Patient tolerated treatment well    Behavior During Therapy WFL for tasks assessed/performed          Past Medical History:  Diagnosis Date   Abscess 10/05/2015   Diabetes mellitus without complication (HCC)    Heart murmur    Herpes exposure    Hyperlipidemia    Hypertension    Past Surgical History:  Procedure Laterality Date   AMPUTATION Left 06/23/2020   Procedure: AMPUTATION ABOVE KNEE;  Surgeon: Marea Selinda RAMAN, MD;  Location: [facility];  Service: General;  Laterality: Left;   LOWER EXTREMITY ANGIOGRAPHY Left 04/09/2019   Procedure: LOWER EXTREMITY ANGIOGRAPHY;  Surgeon: Marea Selinda RAMAN, MD;  Location: [facility];  Service: Cardiovascular;  Laterality: Left;   LOWER EXTREMITY ANGIOGRAPHY Left 04/19/2020   Procedure: Lower Extremity Angiography;  Surgeon: Marea Selinda RAMAN, MD;  Location: [facility];  Service: Cardiovascular;  Laterality: Left;   LOWER EXTREMITY ANGIOGRAPHY Left 04/20/2020   Procedure: Lower Extremity Angiography;  Surgeon: Marea Selinda RAMAN, MD;  Location: [facility];  Service: Cardiovascular;  Laterality: Left;   LOWER EXTREMITY INTERVENTION N/A 05/30/2019   Procedure: LOWER EXTREMITY INTERVENTION;  Surgeon: Jama Cordella MATSU, MD;  Location: [facility];  Service: Cardiovascular;  Laterality: N/A;   PENILE PROSTHESIS IMPLANT N/A  02/05/2024   Procedure: INSERTION OF INFLATABLE PENILE PROSTHESIS;  Surgeon: Lovie Arlyss CROME, MD;  Location: [facility];  Service: Urology;  Laterality: N/A;  135 MINUTES NEEDED FOR CASE   WRIST SURGERY Left    Patient Active Problem List   Diagnosis Date Noted   Skin abrasion 01/29/2024   Edema of toe 08/20/2023   Toe infection 08/20/2023   Diabetic polyneuropathy associated with diabetes mellitus due to underlying condition (HCC) 05/18/2023   Benign mole 03/20/2023   Mixed hyperlipidemia 03/20/2023   Hx of AKA (above knee amputation), left (HCC) 09/10/2020   Protein-calorie malnutrition, severe 06/23/2020   Pressure injury of skin 06/22/2020   BKA stump complication (HCC) 06/21/2020   Hyponatremia 06/21/2020   Dehydration 06/21/2020   Ischemic leg 04/20/2020   Ischemia 04/16/2020   Ischemia of left lower extremity 05/30/2019   PAD (peripheral artery disease) (HCC) 04/08/2019   Lacunar infarction (HCC) 08/03/2017   Bruit of left carotid artery 08/03/2017   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) 08/02/2017   Abscess or cellulitis, neck 10/08/2015   Hyperglycemia 10/08/2015   Hypertension goal BP (blood pressure) < 140/90 10/08/2015   Heart murmur 10/08/2015    ONSET DATE: July 2021 (initial amputation)  REFERRING DIAG: S10.387 (ICD-10-CM) - Hx of AKA (above knee amputation), left (HCC) I73.9 (ICD-10-CM) - PAD (peripheral artery disease) (HCC)  THERAPY DIAG:  Other abnormalities of gait and mobility  Unsteadiness on feet  Muscle weakness (generalized)  Abnormality of gait and mobility  Rationale for Evaluation and Treatment: Rehabilitation  SUBJECTIVE:   SUBJECTIVE STATEMENT: He is finishing his prednisone  this week.  He reports his BP has been elevated because of this.  His poison oak is healing well though.  Denies falls.  Pt accompanied by: self (drives himself)  PERTINENT HISTORY: HTN (goal <140/90), DM2, lacunar infarction,  PAD, heart murmur, diabetic retinopathy (treated with shots)  PAIN:  Are you having pain? 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  -Verbally reviewed Hep - pt having most difficulty with STS w/o UE support, but all others going okay -pt still unsure of ply he wears at baseline due to wear of socks - instructed to call [employer] for more information on brand and ply so he can better track; has not had much need for adjustment but understands he should add if socket is loose -5xSTS (attempting w/o UE support - intermittent touch to initiate stand needed):  22.21 sec -5xSTS w/ BUE support:  11.47 sec, most momentum use on initial 2 stands - :  26.50 sec w/ 2WW mod I = 0.38 m/sec OR 1.25 ft/sec AMPUTEE MOBILITY PREDICTOR ASSESSMENT TOOL Initial instructions: Client is seated in a hard chair with arms. The following manoeuvres are tested with or without the use of the prosthesis.  Advise the person of each task or group of tasks prior to performance.  Please avoid unnecessary chatter throughout the test.  Safety First, no task should be performed if either the tester or client is uncertain of a safe outcome.  1. Sitting Balance: Sit forward in a chair with arms folded across chest for 60s. Cannot sit upright independently for 60s Can sit upright independently for 60s = 0 = 1  1  2. Sitting reach:  Reach forwards and grasp the ruler.  (Tester holds ruler 12in beyond extended arms midline to the sternum) Does not attempt Cannot grasp or requires arm support Reaches  forward and successfully grasps item.  = 0 = 1  = 2    2  3. Chair to chair transfer: 2 chairs at 90. Pt. may choose direction and use their upper limbs. Cannot do or requires physical assistance Performs independently, but appears unsteady Performs independently, appears to be steady and safe = 0  = 1 = 2  1  4. Arises from a chair: Ask pt. to fold arms across chest and stand. If  unable, use arms or assistive device. Unable without help (physical assistance) Able, uses arms/assist device to help Able, without using arms = 0  = 1 = 2   2  5. Attempts to arise from a chair: (stopwatch ready) If attempt in no. 4. was without arms then ignore and allow another attempt without penalty. Unable without help (physical assistance) Able requires >1 attempt Able to rise one attempt = 0  = 1 = 2   2  6. Immediate Standing Balance: (first 5s) Begin timing immediately. Unsteady (staggers, moves foot, sways ) Steady using walking aid or other support Steady without walker or other support = 0 = 1  = 2  2  7. Standing Balance (30s): (stopwatch ready) For item no.'s 7 & 8, first attempt is without assistive device.  If support is required allow after first attempt Unsteady Steady but uses walking aid or other support Standing without support = 0  = 1 = 2    2  8. Single limb standing balance: (stopwatch ready) Time the duration of single limb standing on both the sound and prosthetic limb up to 30s.   Grade the quality, not the time.  *Eliminate item 8 for AMPnoPRO*  Sound side  30 seconds  Prosthetic side 30 seconds Non-prosthetic side Unsteady Steady but uses walking aid or other support for 30s Single-limb standing without support for 30s  Prosthetic Side Unsteady Steady but uses walking aid or other support for 30s Single-limb standing without support for 30s  = 0 = 1  = 2    = 0  = 1  = 2    2     1   9. Standing reach: Reach forward and  grasp the ruler.  (Tester holds ruler 12in beyond extended arm(s) midline to the sternum) Does not attempt Cannot grasp or requires arm support on assistive device Reaches forward and successfully grasps item no support = 0  = 1  = 2   1  10. Nudge test: With feet as close together as possible, examiner pushes lightly on pt.'s sternum with palm of hand 3 times (toes should rise) Begins to fall Staggers, grabs, catches self ore uses assistive device Steady = 0  = 1 = 2   0  11. Eyes Closed: (at maximum position #7) If support is required grade as unsteady. Unsteady or grips assistive device Steady without any use of assistive device = 0  = 1  1    12. Pick up objects off the floor: Pick up a pencil off the floor placed midline 12in in front of foot. Unable to pick up object and return to standing Performs with some help (table, chair, walking aid etc) Performs independently (without help) = 0  = 1   = 2  1  13. Sitting down:  Ask pt. to fold arms across chest and sit. If unable, use arm or assistive device. Unsafe (misjudged distance, falls into chair ) Uses arms, assistive device or not a smooth motion Safe, smooth motion = 0  = 1 = 2   2  14. Initiation of gait: (immediately after told to "go") Any hesitancy or multiple attempts to start No hesitancy = 0 = 1  1  15. Step length and height: Walk a measured distance of 23ft twice (up and back). Four scores are required or two scores (a. & b.) for each leg. "Marked deviation" is defined as extreme substitute movements to avoid clearing the floor. a. Swing Foot  Does not advance a minimum of 12in Advances a minimum of 12in  b. Foot Clearance Foot does not completely clear floor without deviation Foot completely clears floor without marked deviation  = 0  = 1   = 0 = 1 Prosthesis  0   1 Sound  0   1  16. Step Continuity Stopping or discontinuity between steps (stop & go gait) Steps appear  continuous = 0  = 1  0  17. Turning:  180 degree turn when returning to chair. Unable to turn, requires intervention to prevent falling Greater than three steps but completes task without intervention No more than three continuous steps with or without assistive aid = 0  = 1  = 2    1  18. Variable cadence:  Walk a distance of 16ft fast as possible safely 4 times.  (Speeds may vary from slow to fast and fast to slow varying cadence) Unable to vary cadence in a controlled manner Asymmetrical increase in cadence controlled manner Symmetrical increase in speed in a controlled manner  = 0 = 1 = 2      1  19. Stepping over an obstacle: Place a movable box of 4in in height in the walking path. Cannot step over the box Catches foot, interrupts stride Steps over without interrupting stride = 0 = 1 = 2   1  20. Stairs (must have at least 2 steps):  Try to go up and down these stairs without holding on to the railing.  Don't hesitate to permit pt. to hold on to rail.  Safety First, if examiner feels that any risk in involved omit and score as 0.  Ascending Unsteady, cannot do One step at a time, or must hold on to railing or device Step over step, does not hold onto the railing or device  Descending Unsteady, cannot do One step at a time, or must hold on to railing or device Step over step, does not hold onto the railing or device  = 0  = 1 = 2    = 0  = 1 = 2   1     1   21. Assistive device selection:  Add points for the use of an assistive device if used for two or more items.  If testing without prosthesis use of appropriate assistive device is mandatory.   Bed bound Wheelchair / Parallel Bars Walker Crutches (axillary or forearm) Cane (straight or quad) None = 0 = 1 = 2 = 3 = 4 = 5    2    Total Score      AMPnoPRO    N/A  /43                          AMPPRO      30 /47     K LEVEL (converted from AMP score)  AMPnoPRO   K0 = (0-8)    K1=  (9-20)    K2 = (21-28)    K3 = (29-36)    K4 = (37-43)  AMPPRO    K1 = (15-26)      K2 = (27-36)     K3 = (37-42)     K4 = (43-47)  PATIENT EDUCATION: Education details: continue HEP, monitor BP, progress towards goals - discussed ongoing POC regarding K level progression. Person educated: Patient Education method: Explanation, Demonstration, and Handouts Education comprehension: verbalized understanding and needs further education  HOME EXERCISE  PROGRAM: Access Code: [email or web address] URL: [email or web address] Date: 05/09/2024 Prepared by: Daved Bull  Exercises - Sidelying Hip Abduction wtih Flexion and Extension (BKA)  - 1 x daily - 7 x weekly - 3 sets - 10 reps - Sidelying Hip Abduction (AKA)  - 1 x daily - 7 x weekly - 3 sets - 10 reps - Sidelying Hip Circles (AKA)  - 1 x daily - 7 x weekly - 3 sets - 10 reps - Supine Psoas Stretch (AKA)  - 1 x daily - 7 x weekly - 3 sets - 10 reps  AT [facility] FIND YOUR MIDLINE POSITION AND PLACE FEET EQUAL DISTANCE FROM THE MIDLINE.  Try to find this position when standing still for activities.    USE TAPE ON FLOOR TO MARK THE MIDLINE POSITION. You also should try to feel with your limb pressure in socket.  You are trying to feel with limb what you used to feel with the bottom of your foot.   Side to Side Shift: Moving your hips only (not shoulders): move weight onto your left leg, HOLD/FEEL.  Move back to equal weight on each leg, HOLD/FEEL. Move weight onto your right leg, HOLD/FEEL. Move back to equal weight on each leg, HOLD/FEEL. Repeat.  Start with both hands on sink, progress to right hand only, then no hands.  Front to Back Shift: Moving your hips only (not shoulders): move your weight forward onto your toes, HOLD/FEEL. Move your weight back to equal Flat Foot on both legs, HOLD/FEEL. Move your weight back onto your heels, HOLD/FEEL. Move your weight back to equal on both legs, HOLD/FEEL. Repeat.   tart with both hands on sink,  progress to right hand only, then no hands. Moving Cones / Cups: With equal weight on each leg: Hold on with one hand the first time, then progress to no hand supports. Move cups from one side of sink to the other. Place cups ~2" out of your reach, progress to 10" beyond reach.  Place one hand in middle of sink and reach with other hand. Do both arms.  Then hover one hand and move cups with other hand. -just use a crossbody reach to end of safe weight shift ROM Overhead/Upward Reaching: alternated reaching up to top cabinets or ceiling if no cabinets present. Keep equal weight on each leg. Start with one hand support on counter while other hand reaches and progress to no hand support with reaching.  ace one hand in middle of sink and reach with other hand. Do both arms.  Then hover one hand and move cups with other hand.  5.   Looking Over Shoulders: With equal weight on each leg: alternate turning to look over your shoulders with one hand support on counter as needed.  Start with head motions only to look in front of shoulder, then even with shoulder and progress to looking behind you. To look to side, move head /eyes, then shoulder on side looking pulls back, shift more weight to side looking and pull hip back. ace one hand in middle of sink and let go with other hand so your shoulder can pull back. Switch hands to look other way.   Then hover one hand and move cups with other hand.  6.  Stepping with leg that is not amputated:  Move items under cabinet out of your way. Shift your hips/pelvis so weight on prosthesis. SLOWLY step other leg so front of foot is in cabinet. Then step back to floor. -  just hike the hip and place the foot back in the starting spot  ASSESSMENT:  CLINICAL IMPRESSION: Patient seen for skilled PT session with emphasis on STG assessment.  He has made modest progress on AMPPRO scoring 30/47 indicating he remains at a K2 level.  His standing and immediate standing balance appear  improved with less upper body assistance.  His gait speed and mechanics remain essentially unchanged.  He has variable carryover of pre-gait style tasks with intermittent step through gait, but PT to work on improving this consistently.  He may benefit from treadmill training in the future w/ likely need for harness or +2 assistance for safety.  Will continue per POC.  OBJECTIVE IMPAIRMENTS: Abnormal gait, decreased activity tolerance, decreased balance, decreased endurance, decreased knowledge of use of DME, decreased mobility, difficulty walking, decreased strength, decreased safety awareness, impaired vision/preception, improper body mechanics, postural dysfunction, and prosthetic dependency .   ACTIVITY LIMITATIONS: carrying, lifting, bending, standing, squatting, stairs, transfers, and locomotion level  PARTICIPATION LIMITATIONS: community activity, occupation, and yard work  PERSONAL FACTORS: Past/current experiences, Time since onset of injury/illness/exacerbation, and 3+ comorbidities: lacunar infarction, HTN, diabetic retinopathy are also affecting patient's functional outcome.   REHAB POTENTIAL: Good  CLINICAL DECISION MAKING: Evolving/moderate complexity  EVALUATION COMPLEXITY: Moderate   GOALS: Goals reviewed with patient? Yes  SHORT TERM GOALS: Target date: 06/06/2024  Pt will be independent and compliant with introductory prosthetic management, strengthening and standing balance HEP in order to maintain functional progress and improve mobility. Baseline:  Ind and compliant per report 7/1 Goal status: MET  2.  Pt will be independent in sock ply adjustment in order to improve wear tolerance and prosthetic fit. Baseline: pt still unsure of ply he wears at baseline due to wear of socks, has not had much need for adjustment but understands he should add if socket is loose (7/1) Goal status: PARTIALLY MET  3.  Pt will decrease 5xSTS to </=12 seconds w/ no more than single UE  support and minimum momentum to initiate transfer in order to demonstrate decreased risk for falls and improved functional bilateral LE strength and power. Baseline:  12.75 sec w/ BUE and moderate use of momentum to initiate stand (6/3); 11.47 sec w/ BUE, most momentum use on initial 2 stands (7/1) Goal status: PARTIALLY MET  4.  Pt will demonstrate a gait speed of >/=1.54 feet/sec in order to decrease risk for falls. Baseline: 1.34 ft/sec w/ 2WW SBA (6/3); 1.25 ft/sec w/ 2WW mod I (7/1) Goal status: NOT MET  5.  Pt will improve AMPPRO to >/=32/47 in order to demonstrate improved functional capacity on current prosthetic componentry. Baseline: 27/47 (6/3); 30/47 (7/1) Goal status: IN PROGRESS  LONG TERM GOALS: Target date: 07/04/2024  Pt will be independent and compliant with advanced prosthetic management, strengthening and standing balance HEP in order to maintain functional progress and improve mobility. Baseline: To be progressed. Goal status: INITIAL  2.   5xSTS to be assessed w/ LTG set as appropriate. Baseline: Assessed 6/3 - no LTG needed at this time. Goal status: REVISED - D/C'd  3.  Pt will demonstrate a gait speed of >/=1.74 feet/sec in order to decrease risk for falls. Baseline: 1.34 ft/sec w/ 2WW SBA (6/3) Goal status: INITIAL  4.  Pt will improve AMPPRO to >/=37/47 in order to demonstrate improved functional capacity on current prosthetic componentry. Baseline: 27/47 (6/3) Goal status: INITIAL  5.  Pt will navigate 4 stairs using step-to pattern and bilateral rails at mod I  level to demonstrate safety in home environment. Baseline: Pt reports ind at home Goal status: INITIAL  6.  Pt will initiate ambulation in clinic >/=50 ft with least restrictive cane option in order to demonstrate improved upright stability and prosthetic management. Baseline: Pt attempts a few feet with cane in home only Goal status: INITIAL   PLAN:  PT FREQUENCY: 2x/week  PT DURATION: 8  weeks  PLANNED INTERVENTIONS: [id number]- PT Re-evaluation, 97750- Physical Performance Testing, 97110-Therapeutic exercises, 97530- Therapeutic activity, [id number]- Neuromuscular re-education, 97535- Self Care, [id number]- Manual therapy, [id number]- Gait training, [phone]- Prosthetic Initial , [phone]- Electrical stimulation (manual), Patient/Family education, Balance training, Stair training, Vestibular training, and DME instructions  PLAN FOR NEXT SESSION: Single leg bridges, work towards cane, progress HEP.  Sock ply adjustment - did he call [employer]?  Determine benefit from loftstrands vs 2WW - AD order (holding on standard walker order request)?  Did he follow-up with [employer] regarding water /sand protection of current prosthetic?  Continue pre-gait and consider treadmill training for gait mechanics including stride and width of BOS.  4-way stepping?  If progressing on AMPPRO at LTG - contact [employer] regarding possible prosthetic changes.  Daved KATHEE Bull, PT, DPT  06/10/2024, 2:48 PM

## 2024-06-11 ENCOUNTER — Telehealth: Payer: Self-pay | Admitting: Cardiology

## 2024-06-11 ENCOUNTER — Other Ambulatory Visit: Payer: Self-pay | Admitting: Cardiology

## 2024-06-11 NOTE — Telephone Encounter (Signed)
 Patient needs order sent to [employer] for prosthesis supplies and socks. The prosthesis supplies should cover everything that he needs.

## 2024-06-11 NOTE — Progress Notes (Signed)
 opened in error

## 2024-06-12 ENCOUNTER — Encounter: Payer: Self-pay | Admitting: Physical Therapy

## 2024-06-12 ENCOUNTER — Other Ambulatory Visit: Payer: Self-pay

## 2024-06-12 ENCOUNTER — Other Ambulatory Visit: Payer: Self-pay | Admitting: Cardiology

## 2024-06-12 ENCOUNTER — Ambulatory Visit: Admitting: Physical Therapy

## 2024-06-12 VITALS — BP 129/88 | HR 86

## 2024-06-12 DIAGNOSIS — M6281 Muscle weakness (generalized): Secondary | ICD-10-CM

## 2024-06-12 DIAGNOSIS — R2681 Unsteadiness on feet: Secondary | ICD-10-CM

## 2024-06-12 DIAGNOSIS — R2689 Other abnormalities of gait and mobility: Secondary | ICD-10-CM

## 2024-06-12 DIAGNOSIS — Z89612 Acquired absence of left leg above knee: Secondary | ICD-10-CM

## 2024-06-12 DIAGNOSIS — R269 Unspecified abnormalities of gait and mobility: Secondary | ICD-10-CM

## 2024-06-12 NOTE — Telephone Encounter (Signed)
 Informed and will have them fax us  information

## 2024-06-12 NOTE — Therapy (Signed)
 OUTPATIENT PHYSICAL THERAPY PROSTHETICS TREATMENT   Patient Name: Jeremy Padilla MRN: [id number] DOB:[birth year], 58 y.o., male Today's Date: 06/12/2024  PCP: Carin Gauze, NP REFERRING PROVIDER: Carin Gauze, NP  END OF SESSION:  PT End of Session - 06/12/24 1549     Visit Number 8    Number of Visits 17    Date for PT Re-Evaluation 07/18/24    Authorization Type Medicare A & B with secondary Medicaid    Progress Note Due on Visit 10    PT Start Time 1535    PT Stop Time 1624    PT Time Calculation (min) 49 min    Equipment Utilized During Treatment Gait belt    Activity Tolerance Patient tolerated treatment well    Behavior During Therapy WFL for tasks assessed/performed          Past Medical History:  Diagnosis Date   Abscess 10/05/2015   Diabetes mellitus without complication (HCC)    Heart murmur    Herpes exposure    Hyperlipidemia    Hypertension    Past Surgical History:  Procedure Laterality Date   AMPUTATION Left 06/23/2020   Procedure: AMPUTATION ABOVE KNEE;  Surgeon: Marea Selinda RAMAN, MD;  Location: [facility];  Service: General;  Laterality: Left;   LOWER EXTREMITY ANGIOGRAPHY Left 04/09/2019   Procedure: LOWER EXTREMITY ANGIOGRAPHY;  Surgeon: Marea Selinda RAMAN, MD;  Location: [facility];  Service: Cardiovascular;  Laterality: Left;   LOWER EXTREMITY ANGIOGRAPHY Left 04/19/2020   Procedure: Lower Extremity Angiography;  Surgeon: Marea Selinda RAMAN, MD;  Location: [facility];  Service: Cardiovascular;  Laterality: Left;   LOWER EXTREMITY ANGIOGRAPHY Left 04/20/2020   Procedure: Lower Extremity Angiography;  Surgeon: Marea Selinda RAMAN, MD;  Location: [facility];  Service: Cardiovascular;  Laterality: Left;   LOWER EXTREMITY INTERVENTION N/A 05/30/2019   Procedure: LOWER EXTREMITY INTERVENTION;  Surgeon: Jama Cordella MATSU, MD;  Location: [facility];  Service: Cardiovascular;  Laterality: N/A;   PENILE PROSTHESIS IMPLANT N/A  02/05/2024   Procedure: INSERTION OF INFLATABLE PENILE PROSTHESIS;  Surgeon: Lovie Arlyss CROME, MD;  Location: [facility];  Service: Urology;  Laterality: N/A;  135 MINUTES NEEDED FOR CASE   WRIST SURGERY Left    Patient Active Problem List   Diagnosis Date Noted   Skin abrasion 01/29/2024   Edema of toe 08/20/2023   Toe infection 08/20/2023   Diabetic polyneuropathy associated with diabetes mellitus due to underlying condition (HCC) 05/18/2023   Benign mole 03/20/2023   Mixed hyperlipidemia 03/20/2023   Hx of AKA (above knee amputation), left (HCC) 09/10/2020   Protein-calorie malnutrition, severe 06/23/2020   Pressure injury of skin 06/22/2020   BKA stump complication (HCC) 06/21/2020   Hyponatremia 06/21/2020   Dehydration 06/21/2020   Ischemic leg 04/20/2020   Ischemia 04/16/2020   Ischemia of left lower extremity 05/30/2019   PAD (peripheral artery disease) (HCC) 04/08/2019   Lacunar infarction (HCC) 08/03/2017   Bruit of left carotid artery 08/03/2017   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) 08/02/2017   Abscess or cellulitis, neck 10/08/2015   Hyperglycemia 10/08/2015   Hypertension goal BP (blood pressure) < 140/90 10/08/2015   Heart murmur 10/08/2015    ONSET DATE: July 2021 (initial amputation)  REFERRING DIAG: S10.387 (ICD-10-CM) - Hx of AKA (above knee amputation), left (HCC) I73.9 (ICD-10-CM) - PAD (peripheral artery disease) (HCC)  THERAPY DIAG:  Other abnormalities of gait and mobility  Unsteadiness on feet  Muscle weakness (generalized)  Abnormality of gait and mobility  Rationale for Evaluation and Treatment: Rehabilitation  SUBJECTIVE:   SUBJECTIVE STATEMENT: His right arm started itching again just before therapy but he took some medicine and is okay.  He is afraid his BP is still high.  He called [facility] and his PCP and they are coordinating to get him new socks.  He did not know he was supposed to get new socks  every 6-8 months.  Denies falls.  Pt accompanied by: self (drives himself)  PERTINENT HISTORY: HTN (goal <140/90), DM2, lacunar infarction, PAD, heart murmur, diabetic retinopathy (treated with shots)  PAIN:  Are you having pain? 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 -Holding alternating stride position > wide stance > narrow stance for ring toss until able to get all rings on far target, SBA -Step to dot target alternating LE w/ 4lb slam ball in stride, CGA -STS w/ 4lb slam ball x12, CGA  PATIENT EDUCATION: Education details: continue HEP - continue STS focus and can add 4-way stepping at counter and STS w/ stride step w/ hand support, monitor BP Person educated: Patient Education method: Explanation, Demonstration, and Handouts Education comprehension: verbalized understanding and needs further education  HOME EXERCISE PROGRAM: Access Code: [email or web address] URL: [email or web address] Date: 05/09/2024 Prepared by: Daved Bull  Exercises - Sidelying Hip Abduction wtih Flexion and Extension (BKA)  - 1 x daily - 7 x weekly - 3 sets - 10 reps - Sidelying Hip Abduction (AKA)  - 1 x daily - 7 x weekly - 3 sets - 10 reps - Sidelying Hip Circles (AKA)  - 1 x daily - 7 x weekly - 3 sets - 10 reps - Supine Psoas Stretch (AKA)  - 1 x daily - 7 x weekly - 3 sets - 10 reps  AT [facility] FIND YOUR MIDLINE POSITION AND PLACE FEET EQUAL DISTANCE FROM THE MIDLINE.  Try to find this  position when standing still for activities.    USE TAPE ON FLOOR TO MARK THE MIDLINE POSITION. You also should try to feel with your limb pressure in socket.  You are trying to feel with limb what you used to feel with the bottom of your foot.   Side to Side Shift: Moving your hips only (not shoulders): move weight onto your left leg, HOLD/FEEL.  Move back to equal weight on each leg,  HOLD/FEEL. Move weight onto your right leg, HOLD/FEEL. Move back to equal weight on each leg, HOLD/FEEL. Repeat.  Start with both hands on sink, progress to right hand only, then no hands.  Front to Back Shift: Moving your hips only (not shoulders): move your weight forward onto your toes, HOLD/FEEL. Move your weight back to equal Flat Foot on both legs, HOLD/FEEL. Move your weight back onto your heels, HOLD/FEEL. Move your weight back to equal on both legs, HOLD/FEEL. Repeat.   tart with both hands on sink, progress to right hand only, then no hands. Moving Cones / Cups: With equal weight on each leg: Hold on with one hand the first time, then progress to no hand supports. Move cups from one side of sink to the other. Place cups ~2" out of your reach, progress to 10" beyond reach.  Place one hand in middle of sink and reach with other hand. Do both arms.  Then hover one hand and move cups with other hand. -just use a crossbody reach to end of safe weight shift ROM Overhead/Upward Reaching: alternated reaching up to top cabinets or ceiling if no cabinets present. Keep equal weight on each leg. Start with one hand support on counter while other hand reaches and progress to no hand support with reaching.  ace one hand in middle of sink and reach with other hand. Do both arms.  Then hover one hand and move cups with other hand.  5.   Looking Over Shoulders: With equal weight on each leg: alternate turning to look over your shoulders with one hand support on counter as needed.  Start with head motions only to look in front of  shoulder, then even with shoulder and progress to looking behind you. To look to side, move head /eyes, then shoulder on side looking pulls back, shift more weight to side looking and pull hip back. ace one hand in middle of sink and let go with other hand so your shoulder can pull back. Switch hands to look other way.   Then hover one hand and move cups with other hand.  6.  Stepping with leg that is not amputated:  Move items under cabinet out of your way. Shift your hips/pelvis so weight on prosthesis. SLOWLY step other leg so front of foot is in cabinet. Then step back to floor. - just hike the hip and place the foot back in the starting spot  ASSESSMENT:  CLINICAL IMPRESSION: Patient seen for skilled PT session with emphasis on pre-gait mechanics.  He has ongoing step to quick cadence limiting safe progression to cane option.  PT continues to emphasize increased stride and step through focus and added HEP tasks to reflect work today.  His static stability is improving well.  Will continue per POC.  OBJECTIVE IMPAIRMENTS: Abnormal gait, decreased activity tolerance, decreased balance, decreased endurance, decreased knowledge of use of DME, decreased mobility, difficulty walking, decreased strength, decreased safety awareness, impaired vision/preception, improper body mechanics, postural dysfunction, and prosthetic dependency .   ACTIVITY LIMITATIONS: carrying, lifting, bending, standing, squatting, stairs, transfers, and locomotion level  PARTICIPATION LIMITATIONS: community activity, occupation, and yard work  PERSONAL FACTORS: Past/current experiences, Time since onset of injury/illness/exacerbation, and 3+ comorbidities: lacunar infarction, HTN, diabetic retinopathy are also affecting patient's functional outcome.   REHAB POTENTIAL: Good  CLINICAL DECISION MAKING: Evolving/moderate complexity  EVALUATION COMPLEXITY: Moderate   GOALS: Goals reviewed with patient? Yes  SHORT TERM  GOALS: Target date: 06/06/2024  Pt will be independent  and compliant with introductory prosthetic management, strengthening and standing balance HEP in order to maintain functional progress and improve mobility. Baseline:  Ind and compliant per report 7/1 Goal status: MET  2.  Pt will be independent in sock ply adjustment in order to improve wear tolerance and prosthetic fit. Baseline: pt still unsure of ply he wears at baseline due to wear of socks, has not had much need for adjustment but understands he should add if socket is loose (7/1) Goal status: PARTIALLY MET  3.  Pt will decrease 5xSTS to </=12 seconds w/ no more than single UE support and minimum momentum to initiate transfer in order to demonstrate decreased risk for falls and improved functional bilateral LE strength and power. Baseline:  12.75 sec w/ BUE and moderate use of momentum to initiate stand (6/3); 11.47 sec w/ BUE, most momentum use on initial 2 stands (7/1) Goal status: PARTIALLY MET  4.  Pt will demonstrate a gait speed of >/=1.54 feet/sec in order to decrease risk for falls. Baseline: 1.34 ft/sec w/ 2WW SBA (6/3); 1.25 ft/sec w/ 2WW mod I (7/1) Goal status: NOT MET  5.  Pt will improve AMPPRO to >/=32/47 in order to demonstrate improved functional capacity on current prosthetic componentry. Baseline: 27/47 (6/3); 30/47 (7/1) Goal status: IN PROGRESS  LONG TERM GOALS: Target date: 07/04/2024  Pt will be independent and compliant with advanced prosthetic management, strengthening and standing balance HEP in order to maintain functional progress and improve mobility. Baseline: To be progressed. Goal status: INITIAL  2.   5xSTS to be assessed w/ LTG set as appropriate. Baseline: Assessed 6/3 - no LTG needed at this time. Goal status: REVISED - D/C'd  3.  Pt will demonstrate a gait speed of >/=1.74 feet/sec in order to decrease risk for falls. Baseline: 1.34 ft/sec w/ 2WW SBA (6/3) Goal status: INITIAL  4.  Pt  will improve AMPPRO to >/=37/47 in order to demonstrate improved functional capacity on current prosthetic componentry. Baseline: 27/47 (6/3) Goal status: INITIAL  5.  Pt will navigate 4 stairs using step-to pattern and bilateral rails at mod I level to demonstrate safety in home environment. Baseline: Pt reports ind at home Goal status: INITIAL  6.  Pt will initiate ambulation in clinic >/=50 ft with least restrictive cane option in order to demonstrate improved upright stability and prosthetic management. Baseline: Pt attempts a few feet with cane in home only Goal status: INITIAL   PLAN:  PT FREQUENCY: 2x/week  PT DURATION: 8 weeks  PLANNED INTERVENTIONS: [id number]- PT Re-evaluation, 97750- Physical Performance Testing, 97110-Therapeutic exercises, 97530- Therapeutic activity, [id number]- Neuromuscular re-education, 97535- Self Care, [id number]- Manual therapy, [id number]- Gait training, [phone]- Prosthetic Initial , [phone]- Electrical stimulation (manual), Patient/Family education, Balance training, Stair training, Vestibular training, and DME instructions  PLAN FOR NEXT SESSION: Single leg bridges, work towards cane, progress HEP.  Sock ply adjustment - did he call [facility]?  Determine benefit from loftstrands vs 2WW - AD order (holding on standard walker order request)?  Did he follow-up with [facility] regarding water /sand protection of current prosthetic?  Continue pre-gait and consider treadmill training for gait mechanics including stride and width of BOS.  4-way stepping?  If progressing on AMPPRO at LTG - contact [facility] regarding possible prosthetic changes.  Daved KATHEE Bull, PT, DPT  06/12/2024, 4:28 PM

## 2024-06-17 ENCOUNTER — Encounter: Payer: Self-pay | Admitting: Physical Therapy

## 2024-06-17 ENCOUNTER — Ambulatory Visit: Admitting: Physical Therapy

## 2024-06-17 VITALS — BP 135/93 | HR 93

## 2024-06-17 DIAGNOSIS — R2689 Other abnormalities of gait and mobility: Secondary | ICD-10-CM

## 2024-06-17 DIAGNOSIS — R2681 Unsteadiness on feet: Secondary | ICD-10-CM

## 2024-06-17 DIAGNOSIS — R269 Unspecified abnormalities of gait and mobility: Secondary | ICD-10-CM

## 2024-06-17 DIAGNOSIS — M6281 Muscle weakness (generalized): Secondary | ICD-10-CM
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
## 2024-06-18 ENCOUNTER — Telehealth: Payer: Self-pay

## 2024-06-18 NOTE — Telephone Encounter (Signed)
 Pt called saying they are still waiting for their prosthesis supply referral as they need it for PT appointments. Please advise.

## 2024-06-19 ENCOUNTER — Encounter: Payer: Self-pay | Admitting: Physical Therapy

## 2024-06-19 ENCOUNTER — Ambulatory Visit: Admitting: Physical Therapy

## 2024-06-19 VITALS — BP 136/76 | HR 76

## 2024-06-19 DIAGNOSIS — R269 Unspecified abnormalities of gait and mobility: Secondary | ICD-10-CM

## 2024-06-19 DIAGNOSIS — R2681 Unsteadiness on feet: Secondary | ICD-10-CM

## 2024-06-19 DIAGNOSIS — R2689 Other abnormalities of gait and mobility: Secondary | ICD-10-CM | POA: Diagnosis not present

## 2024-06-19 DIAGNOSIS — M6281 Muscle weakness (generalized): Secondary | ICD-10-CM

## 2024-06-19 NOTE — Therapy (Signed)
 OUTPATIENT PHYSICAL THERAPY PROSTHETICS TREATMENT   Patient Name: Jeremy Padilla MRN: [id number] DOB:01-Aug-1966, 58 y.o., male Today's Date: 06/19/2024  PCP: Carin Gauze, NP REFERRING PROVIDER: Carin Gauze, NP  END OF SESSION:  PT End of Session - 06/19/24 1549     Visit Number 10    Number of Visits 17    Date for PT Re-Evaluation 07/18/24    Authorization Type Medicare A & B with secondary Medicaid    Progress Note Due on Visit 10    PT Start Time 1543    PT Stop Time 1629    PT Time Calculation (min) 46 min    Equipment Utilized During Treatment Gait belt    Activity Tolerance Patient tolerated treatment well    Behavior During Therapy WFL for tasks assessed/performed          Past Medical History:  Diagnosis Date   Abscess 10/05/2015   Diabetes mellitus without complication (HCC)    Heart murmur    Herpes exposure    Hyperlipidemia    Hypertension    Past Surgical History:  Procedure Laterality Date   AMPUTATION Left 06/23/2020   Procedure: AMPUTATION ABOVE KNEE;  Surgeon: Marea Selinda RAMAN, MD;  Location: [facility];  Service: General;  Laterality: Left;   LOWER EXTREMITY ANGIOGRAPHY Left 04/09/2019   Procedure: LOWER EXTREMITY ANGIOGRAPHY;  Surgeon: Marea Selinda RAMAN, MD;  Location: [facility];  Service: Cardiovascular;  Laterality: Left;   LOWER EXTREMITY ANGIOGRAPHY Left 04/19/2020   Procedure: Lower Extremity Angiography;  Surgeon: Marea Selinda RAMAN, MD;  Location: [facility];  Service: Cardiovascular;  Laterality: Left;   LOWER EXTREMITY ANGIOGRAPHY Left 04/20/2020   Procedure: Lower Extremity Angiography;  Surgeon: Marea Selinda RAMAN, MD;  Location: [facility];  Service: Cardiovascular;  Laterality: Left;   LOWER EXTREMITY INTERVENTION N/A 05/30/2019   Procedure: LOWER EXTREMITY INTERVENTION;  Surgeon: Jama Cordella MATSU, MD;  Location: [facility];  Service: Cardiovascular;  Laterality: N/A;   PENILE PROSTHESIS IMPLANT N/A  02/05/2024   Procedure: INSERTION OF INFLATABLE PENILE PROSTHESIS;  Surgeon: Lovie Arlyss CROME, MD;  Location: [facility];  Service: Urology;  Laterality: N/A;  135 MINUTES NEEDED FOR CASE   WRIST SURGERY Left    Patient Active Problem List   Diagnosis Date Noted   Skin abrasion 01/29/2024   Edema of toe 08/20/2023   Toe infection 08/20/2023   Diabetic polyneuropathy associated with diabetes mellitus due to underlying condition (HCC) 05/18/2023   Benign mole 03/20/2023   Mixed hyperlipidemia 03/20/2023   Hx of AKA (above knee amputation), left (HCC) 09/10/2020   Protein-calorie malnutrition, severe 06/23/2020   Pressure injury of skin 06/22/2020   BKA stump complication (HCC) 06/21/2020   Hyponatremia 06/21/2020   Dehydration 06/21/2020   Ischemic leg 04/20/2020   Ischemia 04/16/2020   Ischemia of left lower extremity 05/30/2019   PAD (peripheral artery disease) (HCC) 04/08/2019   Lacunar infarction (HCC) 08/03/2017   Bruit of left carotid artery 08/03/2017   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) 08/02/2017   Abscess or cellulitis, neck 10/08/2015   Hyperglycemia 10/08/2015   Hypertension goal BP (blood pressure) < 140/90 10/08/2015   Heart murmur 10/08/2015    ONSET DATE: July 2021 (initial amputation)  REFERRING DIAG: S10.387 (ICD-10-CM) - Hx of AKA (above knee amputation), left (HCC) I73.9 (ICD-10-CM) - PAD (peripheral artery disease) (HCC)  THERAPY DIAG:  Other abnormalities of gait and mobility  Unsteadiness on feet  Muscle weakness (generalized)  Abnormality of gait and mobility  Rationale for Evaluation and Treatment: Rehabilitation  SUBJECTIVE:   SUBJECTIVE STATEMENT: He called [facility] and his doctor and plans to follow-up about the order if he does not hear back in coming days.  He was working outside right before coming here.  He his finished with his steroid.  Denies falls.  He has been practicing the counter additions to  his HEP from prior 2 visits. Pt accompanied by: self (drives himself)  PERTINENT HISTORY: HTN (goal <140/90), DM2, lacunar infarction, PAD, heart murmur, diabetic retinopathy (treated with shots)  PAIN:  Are you having pain? 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   PATIENT GOALS: to walk better and maybe get a better prosthetic, to be more independent                                                                                                                            TREATMENT LUE in sitting prior to goal assessment: Today's Vitals   06/19/24 1552  BP: 136/76  Pulse: 76   -Arm bike on level 5.0 x 8 minutes backwards only cued for periscapular engagement for postural control -Several reps of 41ft distance using RUE only in // bars working on reciprocal stepping and anterior pelvic translation of the LLE to improve stance phase > x200 ft practicing reciprocal stepping w/ 2WW SBA > x80 ft CGA w/ SBQC working on step to pattern w/ good upright stability > standalone cane x48 ft CGA progressing away from ballet bar w/ good stability, but pt fatigues much quicker requiring seated recovery following task  PATIENT EDUCATION: Education details: continue HEP - continue STS focus and can add 4-way stepping at counter and STS w/ stride step w/ hand support - can also add hip hike and IR/ER w/ hip flexion/extension in SLS at bar as well, monitor BP Person educated: Patient Education method: Explanation, Demonstration, and Handouts Education comprehension: verbalized understanding and needs further education  HOME EXERCISE PROGRAM: Access Code: [email or web address] URL: [email or web address] Date: 05/09/2024 Prepared by: Daved Bull  Exercises - Sidelying Hip Abduction wtih Flexion and Extension (BKA)  - 1 x daily - 7 x weekly - 3 sets - 10 reps - Sidelying Hip Abduction (AKA)  - 1 x daily - 7 x weekly - 3 sets - 10 reps - Sidelying  Hip Circles (AKA)  - 1 x daily - 7 x weekly - 3 sets - 10 reps - Supine Psoas Stretch (AKA)  - 1 x daily - 7 x weekly - 3 sets - 10 reps  AT [facility] FIND YOUR MIDLINE POSITION AND PLACE FEET EQUAL DISTANCE FROM THE MIDLINE.  Try to find this position when standing still for activities.    USE TAPE ON FLOOR TO MARK THE MIDLINE POSITION. You also should try to feel with your limb pressure in socket.  You are trying to feel with limb what  you used to feel with the bottom of your foot.   Side to Side Shift: Moving your hips only (not shoulders): move weight onto your left leg, HOLD/FEEL.  Move back to equal weight on each leg, HOLD/FEEL. Move weight onto your right leg, HOLD/FEEL. Move back to equal weight on each leg, HOLD/FEEL. Repeat.  Start with both hands on sink, progress to right hand only, then no hands.  Front to Back Shift: Moving your hips only (not shoulders): move your weight forward onto your toes, HOLD/FEEL. Move your weight back to equal Flat Foot on both legs, HOLD/FEEL. Move your weight back onto your heels, HOLD/FEEL. Move your weight back to equal on both legs, HOLD/FEEL. Repeat.   tart with both hands on sink, progress to right hand only, then no hands. Moving Cones / Cups: With equal weight on each leg: Hold on with one hand the first time, then progress to no hand supports. Move cups from one side of sink to the other. Place cups ~2" out of your reach, progress to 10" beyond reach.  Place one hand in middle of sink and reach with other hand. Do both arms.  Then hover one hand and move cups with other hand. -just use a crossbody reach to end of safe weight shift ROM Overhead/Upward Reaching: alternated reaching up to top cabinets or ceiling if no cabinets present. Keep equal weight on each leg. Start with one hand support on counter while other hand reaches and progress to no hand support with reaching.  ace one hand in middle of sink and reach with other hand. Do both arms.  Then hover one  hand and move cups with other hand.  5.   Looking Over Shoulders: With equal weight on each leg: alternate turning to look over your shoulders with one hand support on counter as needed.  Start with head motions only to look in front of shoulder, then even with shoulder and progress to looking behind you. To look to side, move head /eyes, then shoulder on side looking pulls back, shift more weight to side looking and pull hip back. ace one hand in middle of sink and let go with other hand so your shoulder can pull back. Switch hands to look other way.   Then hover one hand and move cups with other hand.  6.  Stepping with leg that is not amputated:  Move items under cabinet out of your way. Shift your hips/pelvis so weight on prosthesis. SLOWLY step other leg so front of foot is in cabinet. Then step back to floor. - just hike the hip and place the foot back in the starting spot  ASSESSMENT:  CLINICAL IMPRESSION: Patient seen for skilled PT session with emphasis on progression to use of a cane in clinic.  Did not recommend trying at home until further practice managing fatigue with household distances.  He does well with some improvement in carryover to 2WW using step through pattern as practiced in // bars w/ decreased UE support.  His prosthetic SLS and anterior translation is improving and he is more acknowledging of engagement with this portion of gait.  He is making great progress towards goals and PT to continue per POC.  OBJECTIVE IMPAIRMENTS: Abnormal gait, decreased activity tolerance, decreased balance, decreased endurance, decreased knowledge of use of DME, decreased mobility, difficulty walking, decreased strength, decreased safety awareness, impaired vision/preception, improper body mechanics, postural dysfunction, and prosthetic dependency .   ACTIVITY LIMITATIONS: carrying, lifting, bending, standing, squatting, stairs,  transfers, and locomotion level  PARTICIPATION LIMITATIONS: community  activity, occupation, and yard work  PERSONAL FACTORS: Past/current experiences, Time since onset of injury/illness/exacerbation, and 3+ comorbidities: lacunar infarction, HTN, diabetic retinopathy are also affecting patient's functional outcome.   REHAB POTENTIAL: Good  CLINICAL DECISION MAKING: Evolving/moderate complexity  EVALUATION COMPLEXITY: Moderate   GOALS: Goals reviewed with patient? Yes  SHORT TERM GOALS: Target date: 06/06/2024  Pt will be independent and compliant with introductory prosthetic management, strengthening and standing balance HEP in order to maintain functional progress and improve mobility. Baseline:  Ind and compliant per report 7/1 Goal status: MET  2.  Pt will be independent in sock ply adjustment in order to improve wear tolerance and prosthetic fit. Baseline: pt still unsure of ply he wears at baseline due to wear of socks, has not had much need for adjustment but understands he should add if socket is loose (7/1) Goal status: PARTIALLY MET  3.  Pt will decrease 5xSTS to </=12 seconds w/ no more than single UE support and minimum momentum to initiate transfer in order to demonstrate decreased risk for falls and improved functional bilateral LE strength and power. Baseline:  12.75 sec w/ BUE and moderate use of momentum to initiate stand (6/3); 11.47 sec w/ BUE, most momentum use on initial 2 stands (7/1) Goal status: PARTIALLY MET  4.  Pt will demonstrate a gait speed of >/=1.54 feet/sec in order to decrease risk for falls. Baseline: 1.34 ft/sec w/ 2WW SBA (6/3); 1.25 ft/sec w/ 2WW mod I (7/1) Goal status: NOT MET  5.  Pt will improve AMPPRO to >/=32/47 in order to demonstrate improved functional capacity on current prosthetic componentry. Baseline: 27/47 (6/3); 30/47 (7/1) Goal status: IN PROGRESS  LONG TERM GOALS: Target date: 07/04/2024  Pt will be independent and compliant with advanced prosthetic management, strengthening and standing balance  HEP in order to maintain functional progress and improve mobility. Baseline: To be progressed. Goal status: INITIAL  2.   5xSTS to be assessed w/ LTG set as appropriate. Baseline: Assessed 6/3 - no LTG needed at this time. Goal status: REVISED - D/C'd  3.  Pt will demonstrate a gait speed of >/=1.74 feet/sec in order to decrease risk for falls. Baseline: 1.34 ft/sec w/ 2WW SBA (6/3) Goal status: INITIAL  4.  Pt will improve AMPPRO to >/=37/47 in order to demonstrate improved functional capacity on current prosthetic componentry. Baseline: 27/47 (6/3) Goal status: INITIAL  5.  Pt will navigate 4 stairs using step-to pattern and bilateral rails at mod I level to demonstrate safety in home environment. Baseline: Pt reports ind at home Goal status: INITIAL  6.  Pt will initiate ambulation in clinic >/=50 ft with least restrictive cane option in order to demonstrate improved upright stability and prosthetic management. Baseline: Pt attempts a few feet with cane in home only Goal status: INITIAL   PLAN:  PT FREQUENCY: 2x/week  PT DURATION: 8 weeks  PLANNED INTERVENTIONS: [id number]- PT Re-evaluation, 97750- Physical Performance Testing, 97110-Therapeutic exercises, 97530- Therapeutic activity, [id number]- Neuromuscular re-education, 97535- Self Care, [id number]- Manual therapy, [id number]- Gait training, [phone]- Prosthetic Initial , [phone]- Electrical stimulation (manual), Patient/Family education, Balance training, Stair training, Vestibular training, and DME instructions  PLAN FOR NEXT SESSION:  work towards cane, progress HEP.  Sock ply adjustment - did he call [facility]?  Determine benefit from loftstrands vs 2WW - AD order (holding on standard walker order request)?  Did he follow-up with [facility] regarding water /sand protection of current prosthetic?  Continue  pre-gait and consider treadmill training for gait mechanics including stride and width of BOS.   If progressing on AMPPRO at LTG - contact [facility]  regarding possible prosthetic changes.  Daved KATHEE Bull, PT, DPT  06/19/2024, 5:32 PM

## 2024-06-20 ENCOUNTER — Encounter: Admitting: Physical Therapy

## 2024-06-23 ENCOUNTER — Telehealth: Payer: Self-pay

## 2024-06-23 NOTE — Telephone Encounter (Signed)
 Pt called again about an update on his prosthesis supplies as he needs it to go to pt. Please advise.

## 2024-06-24 ENCOUNTER — Telehealth: Payer: Self-pay | Admitting: Physical Therapy

## 2024-06-24 ENCOUNTER — Encounter: Payer: Self-pay | Admitting: Physical Therapy

## 2024-06-24 ENCOUNTER — Ambulatory Visit: Admitting: Physical Therapy

## 2024-06-24 VITALS — BP 114/61 | HR 84

## 2024-06-24 DIAGNOSIS — R2689 Other abnormalities of gait and mobility: Secondary | ICD-10-CM | POA: Diagnosis not present

## 2024-06-24 DIAGNOSIS — M6281 Muscle weakness (generalized): Secondary | ICD-10-CM

## 2024-06-24 DIAGNOSIS — R269 Unspecified abnormalities of gait and mobility: Secondary | ICD-10-CM

## 2024-06-24 DIAGNOSIS — R2681 Unsteadiness on feet: Secondary | ICD-10-CM

## 2024-06-24 NOTE — Therapy (Signed)
 OUTPATIENT PHYSICAL THERAPY PROSTHETICS TREATMENT   Patient Name: Jeremy Padilla MRN: [id number] DOB:10/20/1966, 58 y.o., male Today's Date: 06/24/2024  PCP: Carin Gauze, NP REFERRING PROVIDER: Carin Gauze, NP  END OF SESSION:  PT End of Session - 06/24/24 1327     Visit Number 11    Number of Visits 17    Date for PT Re-Evaluation 07/18/24    Authorization Type Medicare A & B with secondary Medicaid    Progress Note Due on Visit 10    PT Start Time 1317    PT Stop Time 1400    PT Time Calculation (min) 43 min    Equipment Utilized During Treatment Gait belt    Activity Tolerance Patient tolerated treatment well;Other (comment)   stomach ache   Behavior During Therapy [facility] for tasks assessed/performed           Past Medical History:  Diagnosis Date   Abscess 10/05/2015   Diabetes mellitus without complication (HCC)    Heart murmur    Herpes exposure    Hyperlipidemia    Hypertension    Past Surgical History:  Procedure Laterality Date   AMPUTATION Left 06/23/2020   Procedure: AMPUTATION ABOVE KNEE;  Surgeon: Marea Selinda RAMAN, MD;  Location: [facility] OR[facility];  Service: General;  Laterality: Left;   LOWER EXTREMITY ANGIOGRAPHY Left 04/09/2019   Procedure: LOWER EXTREMITY ANGIOGRAPHY;  Surgeon: Marea Selinda RAMAN, MD;  Location: [facility];  Service: Cardiovascular;  Laterality: Left;   LOWER EXTREMITY ANGIOGRAPHY Left 04/19/2020   Procedure: Lower Extremity Angiography;  Surgeon: Marea Selinda RAMAN, MD;  Location: [facility];  Service: Cardiovascular;  Laterality: Left;   LOWER EXTREMITY ANGIOGRAPHY Left 04/20/2020   Procedure: Lower Extremity Angiography;  Surgeon: Marea Selinda RAMAN, MD;  Location: [facility];  Service: Cardiovascular;  Laterality: Left;   LOWER EXTREMITY INTERVENTION N/A 05/30/2019   Procedure: LOWER EXTREMITY INTERVENTION;  Surgeon: Jama Cordella MATSU, MD;  Location: [facility];  Service: Cardiovascular;  Laterality: N/A;    PENILE PROSTHESIS IMPLANT N/A 02/05/2024   Procedure: INSERTION OF INFLATABLE PENILE PROSTHESIS;  Surgeon: Lovie Arlyss CROME, MD;  Location: [facility];  Service: Urology;  Laterality: N/A;  135 MINUTES NEEDED FOR CASE   WRIST SURGERY Left    Patient Active Problem List   Diagnosis Date Noted   Skin abrasion 01/29/2024   Edema of toe 08/20/2023   Toe infection 08/20/2023   Diabetic polyneuropathy associated with diabetes mellitus due to underlying condition (HCC) 05/18/2023   Benign mole 03/20/2023   Mixed hyperlipidemia 03/20/2023   Hx of AKA (above knee amputation), left (HCC) 09/10/2020   Protein-calorie malnutrition, severe 06/23/2020   Pressure injury of skin 06/22/2020   BKA stump complication (HCC) 06/21/2020   Hyponatremia 06/21/2020   Dehydration 06/21/2020   Ischemic leg 04/20/2020   Ischemia 04/16/2020   Ischemia of left lower extremity 05/30/2019   PAD (peripheral artery disease) (HCC) 04/08/2019   Lacunar infarction (HCC) 08/03/2017   Bruit of left carotid artery 08/03/2017   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) 08/02/2017   Abscess or cellulitis, neck 10/08/2015   Hyperglycemia 10/08/2015   Hypertension goal BP (blood pressure) < 140/90 10/08/2015   Heart murmur 10/08/2015    ONSET DATE: July 2021 (initial amputation)  REFERRING DIAG: S10.387 (ICD-10-CM) - Hx of AKA (above knee amputation), left (HCC) I73.9 (ICD-10-CM) - PAD (peripheral artery disease) (HCC)  THERAPY DIAG:  Other abnormalities of gait and  mobility  Unsteadiness on feet  Muscle weakness (generalized)  Abnormality of gait and mobility  Rationale for Evaluation and Treatment: Rehabilitation  SUBJECTIVE:   SUBJECTIVE STATEMENT: He requests therapist reach out to [facility] regarding sock order - PT informs him she will reach out to doctor for order and fax it to [facility].  He was working outside right before coming here.  His stomach has been upset today and  kept him up last night though he is unsure why.  Denies falls.   Pt accompanied by: self (drives himself)  PERTINENT HISTORY: HTN (goal <140/90), DM2, lacunar infarction, PAD, heart murmur, diabetic retinopathy (treated with shots)  PAIN:  Are you having pain? 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   PATIENT GOALS: to walk better and maybe get a better prosthetic, to be more independent                                                                                                                            TREATMENT LUE in sitting prior to interventions: Today's Vitals   06/24/24 1324  BP: 114/61  Pulse: 84    -Arm bike on level 6.0 x 8 minutes (4 minutes forwards and 4 minutes backwards) cued for periscapular engagement for postural control  -Gait training w/ standalone cane SBA-CGA x345' continuously, he has mild right waver w/o LOB, good step-to pattern and sequencing of cane maintained throughout.  Min cues for improved BOS and stride. -Ramp and curb completed x1 at CGA-minA for anterior weight shift w/ ascent and cues for sequencing  PATIENT EDUCATION: Education details: continue HEP - continue STS focus and can add 4-way stepping at counter and STS w/ stride step w/ hand support - can also add hip hike and IR/ER w/ hip flexion/extension in SLS at bar as well, monitor BP; informed pt of order request sent to [employer], NP while he was on arm bike.  Discussed OOP purchase options for cane - pt declined printout - discouraged using cane until further practice in clinic and requested he bring his personal one to clinic once purchased. Person educated: Patient Education method: Explanation, Demonstration, and Handouts Education comprehension: verbalized understanding and needs further education  HOME EXERCISE PROGRAM: Access Code: [email or web address] URL: [email or web address] Date: 05/09/2024 Prepared by: Daved Bull  Exercises - Sidelying Hip Abduction wtih Flexion and Extension (BKA)  - 1 x daily - 7 x weekly - 3 sets - 10 reps - Sidelying Hip Abduction (AKA)  - 1 x daily - 7 x weekly - 3 sets - 10 reps - Sidelying Hip Circles (AKA)  - 1 x daily - 7 x weekly - 3 sets - 10 reps - Supine Psoas Stretch (AKA)  - 1 x daily - 7 x weekly - 3 sets - 10 reps  AT [facility] FIND YOUR MIDLINE POSITION AND PLACE FEET EQUAL DISTANCE FROM THE MIDLINE.  Try to find this position when  standing still for activities.    USE TAPE ON FLOOR TO MARK THE MIDLINE POSITION. You also should try to feel with your limb pressure in socket.  You are trying to feel with limb what you used to feel with the bottom of your foot.   Side to Side Shift: Moving your hips only (not shoulders): move weight onto your left leg, HOLD/FEEL.  Move back to equal weight on each leg, HOLD/FEEL. Move weight onto your right leg, HOLD/FEEL. Move back to equal weight on each leg, HOLD/FEEL. Repeat.  Start with both hands on sink, progress to right hand only, then no hands.  Front to Back Shift: Moving your hips only (not shoulders): move your weight forward onto your toes, HOLD/FEEL. Move your weight back to equal Flat Foot on both legs, HOLD/FEEL. Move your weight back onto your heels, HOLD/FEEL. Move your weight back to equal on both legs, HOLD/FEEL. Repeat.   tart with both hands on sink, progress to right hand only, then no hands. Moving Cones / Cups: With equal weight on each leg: Hold on with one hand the first time, then progress to no hand supports. Move cups from one side of sink to the other. Place cups ~2" out of your reach, progress to 10" beyond reach.  Place one hand in middle of sink and reach with other hand. Do both arms.  Then hover one hand and move cups with other hand. -just use a crossbody reach to end of safe weight shift ROM Overhead/Upward Reaching: alternated reaching up to top cabinets or ceiling if no cabinets present. Keep equal  weight on each leg. Start with one hand support on counter while other hand reaches and progress to no hand support with reaching.  ace one hand in middle of sink and reach with other hand. Do both arms.  Then hover one hand and move cups with other hand.  5.   Looking Over Shoulders: With equal weight on each leg: alternate turning to look over your shoulders with one hand support on counter as needed.  Start with head motions only to look in front of shoulder, then even with shoulder and progress to looking behind you. To look to side, move head /eyes, then shoulder on side looking pulls back, shift more weight to side looking and pull hip back. ace one hand in middle of sink and let go with other hand so your shoulder can pull back. Switch hands to look other way.   Then hover one hand and move cups with other hand.  6.  Stepping with leg that is not amputated:  Move items under cabinet out of your way. Shift your hips/pelvis so weight on prosthesis. SLOWLY step other leg so front of foot is in cabinet. Then step back to floor. - just hike the hip and place the foot back in the starting spot  ASSESSMENT:  CLINICAL IMPRESSION: Patient seen for skilled PT session with emphasis on progression to use of a cane in clinic.  He demonstrates improved tolerance with 345 feet covered w/ use of standalone cane continuously.  He continues to need work on his stride and BOS as he safely progresses to cane use for more than level surfaces.  He was greatly challenged by ramp and curb step this visit and would benefit from repeated challenge with this.  Continue per POC.  OBJECTIVE IMPAIRMENTS: Abnormal gait, decreased activity tolerance, decreased balance, decreased endurance, decreased knowledge of use of DME, decreased mobility, difficulty walking, decreased  strength, decreased safety awareness, impaired vision/preception, improper body mechanics, postural dysfunction, and prosthetic dependency .   ACTIVITY  LIMITATIONS: carrying, lifting, bending, standing, squatting, stairs, transfers, and locomotion level  PARTICIPATION LIMITATIONS: community activity, occupation, and yard work  PERSONAL FACTORS: Past/current experiences, Time since onset of injury/illness/exacerbation, and 3+ comorbidities: lacunar infarction, HTN, diabetic retinopathy are also affecting patient's functional outcome.   REHAB POTENTIAL: Good  CLINICAL DECISION MAKING: Evolving/moderate complexity  EVALUATION COMPLEXITY: Moderate   GOALS: Goals reviewed with patient? Yes  SHORT TERM GOALS: Target date: 06/06/2024  Pt will be independent and compliant with introductory prosthetic management, strengthening and standing balance HEP in order to maintain functional progress and improve mobility. Baseline:  Ind and compliant per report 7/1 Goal status: MET  2.  Pt will be independent in sock ply adjustment in order to improve wear tolerance and prosthetic fit. Baseline: pt still unsure of ply he wears at baseline due to wear of socks, has not had much need for adjustment but understands he should add if socket is loose (7/1) Goal status: PARTIALLY MET  3.  Pt will decrease 5xSTS to </=12 seconds w/ no more than single UE support and minimum momentum to initiate transfer in order to demonstrate decreased risk for falls and improved functional bilateral LE strength and power. Baseline:  12.75 sec w/ BUE and moderate use of momentum to initiate stand (6/3); 11.47 sec w/ BUE, most momentum use on initial 2 stands (7/1) Goal status: PARTIALLY MET  4.  Pt will demonstrate a gait speed of >/=1.54 feet/sec in order to decrease risk for falls. Baseline: 1.34 ft/sec w/ 2WW SBA (6/3); 1.25 ft/sec w/ 2WW mod I (7/1) Goal status: NOT MET  5.  Pt will improve AMPPRO to >/=32/47 in order to demonstrate improved functional capacity on current prosthetic componentry. Baseline: 27/47 (6/3); 30/47 (7/1) Goal status: IN PROGRESS  LONG TERM  GOALS: Target date: 07/04/2024  Pt will be independent and compliant with advanced prosthetic management, strengthening and standing balance HEP in order to maintain functional progress and improve mobility. Baseline: To be progressed. Goal status: INITIAL  2.   5xSTS to be assessed w/ LTG set as appropriate. Baseline: Assessed 6/3 - no LTG needed at this time. Goal status: REVISED - D/C'd  3.  Pt will demonstrate a gait speed of >/=1.74 feet/sec in order to decrease risk for falls. Baseline: 1.34 ft/sec w/ 2WW SBA (6/3) Goal status: INITIAL  4.  Pt will improve AMPPRO to >/=37/47 in order to demonstrate improved functional capacity on current prosthetic componentry. Baseline: 27/47 (6/3) Goal status: INITIAL  5.  Pt will navigate 4 stairs using step-to pattern and bilateral rails at mod I level to demonstrate safety in home environment. Baseline: Pt reports ind at home Goal status: INITIAL  6.  Pt will initiate ambulation in clinic >/=50 ft with least restrictive cane option in order to demonstrate improved upright stability and prosthetic management. Baseline: Pt attempts a few feet with cane in home only Goal status: INITIAL   PLAN:  PT FREQUENCY: 2x/week  PT DURATION: 8 weeks  PLANNED INTERVENTIONS: 97164- PT Re-evaluation, 97750- Physical Performance Testing, 97110-Therapeutic exercises, 97530- Therapeutic activity, [id number]- Neuromuscular re-education, 97535- Self Care, [id number]- Manual therapy, [id number]- Gait training, [phone]- Prosthetic Initial , [phone]- Electrical stimulation (manual), Patient/Family education, Balance training, Stair training, Vestibular training, and DME instructions  PLAN FOR NEXT SESSION:  work towards cane, progress HEP.  Sock ply adjustment - did we get sock order from [facility]?  Determine  benefit from loftstrands vs 2WW - AD order (holding on standard walker order request)?  Did he follow-up with [facility] regarding water /sand protection of current prosthetic?  Standalone cane curb/ramp/stairs  If progressing on AMPPRO at LTG - contact [facility] regarding possible prosthetic changes.  Daved KATHEE Bull, PT, DPT  06/24/2024, 2:04 PM

## 2024-06-24 NOTE — Telephone Encounter (Signed)
 Jeremy Pollen, NP,  Jeremy Padilla  was treated by PT on 06/24/2024.  The patient would benefit from order for new prosthetic socks from [facility]. If you agree, please place an order in [facility]que in [facility] or fax the order to [phone].  You may also send order directly to [facility].  Thank you,  Daved Bull, PT, DPT  [facility] [address] [place], [place]  [address] Phone:  [phone] Fax:  [phone]

## 2024-06-26 ENCOUNTER — Other Ambulatory Visit: Payer: Self-pay | Admitting: Cardiology

## 2024-06-26 DIAGNOSIS — Z89612 Acquired absence of left leg above knee: Secondary | ICD-10-CM

## 2024-06-27 ENCOUNTER — Encounter: Payer: Self-pay | Admitting: Internal Medicine

## 2024-06-27 ENCOUNTER — Ambulatory Visit

## 2024-06-27 ENCOUNTER — Ambulatory Visit: Admitting: Internal Medicine

## 2024-06-27 VITALS — BP 142/72 | HR 71 | Ht 74.0 in | Wt 145.0 lb

## 2024-06-27 DIAGNOSIS — L237 Allergic contact dermatitis due to plants, except food: Secondary | ICD-10-CM | POA: Diagnosis not present

## 2024-06-27 DIAGNOSIS — I739 Peripheral vascular disease, unspecified: Secondary | ICD-10-CM

## 2024-06-27 DIAGNOSIS — I10 Essential (primary) hypertension: Secondary | ICD-10-CM | POA: Diagnosis not present

## 2024-06-27 DIAGNOSIS — Z89612 Acquired absence of left leg above knee: Secondary | ICD-10-CM

## 2024-06-27 DIAGNOSIS — E782 Mixed hyperlipidemia: Secondary | ICD-10-CM

## 2024-06-27 DIAGNOSIS — R7303 Prediabetes: Secondary | ICD-10-CM

## 2024-06-27 MED ORDER — CETIRIZINE HCL 10 MG PO TABS: 10.0000 mg | ORAL_TABLET | Freq: Every day | ORAL | 2 refills | Status: AC

## 2024-06-27 MED ORDER — METHYLPREDNISOLONE 4 MG PO TBPK: ORAL_TABLET | ORAL | 0 refills | Status: DC

## 2024-06-27 NOTE — Progress Notes (Signed)
 Established Patient Office Visit  Subjective:  Patient ID: Jeremy Padilla, male    DOB: Dec 26, 1965  Age: 58 y.o. MRN: [id number]  Chief Complaint  Patient presents with   [facility]    Patient comes in with recurrence of a pruritic rash on his arms and forehead from contact with poison ivy.  Patient was previously treated for similar complaints on 06/04/2024.  Patient thinks that he had completely cleaned out his backyard but apparently he got another exposure, or it could be a continuation of the previous exposure. Denies chest pain or shortness of breath. On exam there is very tiny reddish rash on exposed areas of skin.  Will send in prescription for prednisone  taper and to continue Atarax  at bedtime.  Patient will eventually switch to Zyrtec.    No other concerns at this time.   Past Medical History:  Diagnosis Date   Abscess 10/05/2015   Diabetes mellitus without complication (HCC)    Heart murmur    Herpes exposure    Hyperlipidemia    Hypertension     Past Surgical History:  Procedure Laterality Date   AMPUTATION Left 06/23/2020   Procedure: AMPUTATION ABOVE KNEE;  Surgeon: Marea Selinda RAMAN, MD;  Location: [facility];  Service: General;  Laterality: Left;   LOWER EXTREMITY ANGIOGRAPHY Left 04/09/2019   Procedure: LOWER EXTREMITY ANGIOGRAPHY;  Surgeon: Marea Selinda RAMAN, MD;  Location: [facility];  Service: Cardiovascular;  Laterality: Left;   LOWER EXTREMITY ANGIOGRAPHY Left 04/19/2020   Procedure: Lower Extremity Angiography;  Surgeon: Marea Selinda RAMAN, MD;  Location: [facility];  Service: Cardiovascular;  Laterality: Left;   LOWER EXTREMITY ANGIOGRAPHY Left 04/20/2020   Procedure: Lower Extremity Angiography;  Surgeon: Marea Selinda RAMAN, MD;  Location: [facility];  Service: Cardiovascular;  Laterality: Left;   LOWER EXTREMITY INTERVENTION N/A 05/30/2019   Procedure: LOWER EXTREMITY INTERVENTION;  Surgeon: Jama Cordella MATSU, MD;  Location: [facility]  [facility];  Service: Cardiovascular;  Laterality: N/A;   PENILE PROSTHESIS IMPLANT N/A 02/05/2024   Procedure: INSERTION OF INFLATABLE PENILE PROSTHESIS;  Surgeon: Lovie Arlyss CROME, MD;  Location: [facility];  Service: Urology;  Laterality: N/A;  135 MINUTES NEEDED FOR CASE   WRIST SURGERY Left     Social History   Socioeconomic History   Marital status: Widowed    Spouse name: Not on file   Number of children: Not on file   Years of education: Not on file   Highest education level: Not on file  Occupational History   Occupation: applied for disability  Tobacco Use   Smoking status: Never    Passive exposure: Never   Smokeless tobacco: Never  Vaping Use   Vaping status: Never Used  Substance and Sexual Activity   Alcohol use: Yes    Alcohol/week: 1.0 standard drink of alcohol    Types: 1 Glasses of wine per week    Comment: 1 occasionally   Drug use: No   Sexual activity: Yes  Other Topics Concern   Not on file  Social History Narrative   Wife just passed away Apr 18, 2009   Social Drivers of Health   Financial Resource Strain: Low Risk  (06/01/2020)   Received from [facility]   Overall Financial Resource Strain (CARDIA)    Difficulty of Paying Living Expenses: Not hard at all  Food Insecurity: No Food Insecurity (06/01/2020)   Received from [facility]   Hunger Vital Sign    Within  the past 12 months, you worried that your food would run out before you got the money to buy more.: Never true    Within the past 12 months, the food you bought just didn't last and you didn't have money to get more.: Never true  Transportation Needs: No Transportation Needs (06/01/2020)   Received from [facility] - Transportation    Lack of Transportation (Medical): No    Lack of Transportation (Non-Medical): No  Physical Activity: Not on file  Stress: Not on file  Social Connections: Not on file  Intimate Partner Violence: Not on file    Family History  Problem Relation  Age of Onset   Heart disease Father    Hyperlipidemia Father    Hypertension Father     Allergies  Allergen Reactions   Penicillins Rash    .Has patient had a PCN reaction causing immediate rash, facial/tongue/throat swelling, SOB or lightheadedness with hypotension: Unknown Has patient had a PCN reaction causing severe rash involving mucus membranes or skin necrosis: Unknown Has patient had a PCN reaction that required hospitalization: Unknown Has patient had a PCN reaction occurring within the last 10 years: Unknown If all of the above answers are NO, then may proceed with Cephalosporin use.  Patient Tolerates Cephalosporins    Statins Other (See Comments)    Outpatient Medications Prior to Visit  Medication Sig   aspirin  EC 81 MG tablet Take 81 mg by mouth daily.   Cholecalciferol (VITAMIN D-3) 125 MCG (5000 UT) TABS Take 5,000 Units by mouth daily.   hydrOXYzine  (ATARAX ) 25 MG tablet Take 2 tablets (50 mg total) by mouth every 6 (six) hours as needed for itching.   lisinopril  (ZESTRIL ) 40 MG tablet Take 1 tablet (40 mg total) by mouth daily.   Magnesium  250 MG TABS Take 250 mg by mouth daily.   [DISCONTINUED] predniSONE  (DELTASONE ) 20 MG tablet Take 2 tablets (40 mg) by mouth for 5 days (Patient not taking: Reported on 06/27/2024)   No facility-administered medications prior to visit.    Review of Systems  Constitutional: Negative.  Negative for chills, fever, malaise/fatigue and weight loss.  HENT: Negative.  Negative for sore throat.   Eyes: Negative.   Respiratory: Negative.  Negative for cough and shortness of breath.   Cardiovascular: Negative.  Negative for chest pain, palpitations and leg swelling.  Gastrointestinal: Negative.  Negative for abdominal pain, constipation, diarrhea, heartburn, nausea and vomiting.  Genitourinary: Negative.  Negative for dysuria and flank pain.  Musculoskeletal: Negative.  Negative for joint pain and myalgias.  Skin:  Positive for  itching and rash.  Neurological: Negative.  Negative for dizziness, tingling, tremors and headaches.  Endo/Heme/Allergies: Negative.   Psychiatric/Behavioral: Negative.  Negative for depression and suicidal ideas. The patient is not nervous/anxious.        Objective:   BP (!) 142/72   Pulse 71   Ht 6' 2 (1.88 m)   Wt 145 lb (65.8 kg)   SpO2 99%   BMI 18.62 kg/m   Vitals:   06/27/24 1010  BP: (!) 142/72  Pulse: 71  Height: 6' 2 (1.88 m)  Weight: 145 lb (65.8 kg)  SpO2: 99%  BMI (Calculated): 18.61    Physical Exam Vitals and nursing note reviewed.  Constitutional:      Appearance: Normal appearance.  HENT:     Head: Normocephalic and atraumatic.     Nose: Nose normal.     Mouth/Throat:     Mouth:  Mucous membranes are moist.     Pharynx: Oropharynx is clear.  Eyes:     Conjunctiva/sclera: Conjunctivae normal.     Pupils: Pupils are equal, round, and reactive to light.  Cardiovascular:     Rate and Rhythm: Normal rate and regular rhythm.     Pulses: Normal pulses.     Heart sounds: Normal heart sounds.  Pulmonary:     Effort: Pulmonary effort is normal.     Breath sounds: Normal breath sounds.  Abdominal:     General: Bowel sounds are normal.     Palpations: Abdomen is soft.  Musculoskeletal:        General: Normal range of motion.     Cervical back: Normal range of motion.  Skin:    General: Skin is warm and dry.  Neurological:     General: No focal deficit present.     Mental Status: He is alert and oriented to person, place, and time.  Psychiatric:        Mood and Affect: Mood normal.        Behavior: Behavior normal.        Judgment: Judgment normal.      No results found for any visits on 06/27/24.  No results found for this or any previous visit (from the past 2160 hours).    Assessment & Plan:  Prednisone  taper and Atarax .  To return in 2 weeks for blood work and blood pressure monitoring. Problem List Items Addressed This Visit      PAD (peripheral artery disease) (HCC)   Hx of AKA (above knee amputation), left (HCC)   Mixed hyperlipidemia   Other Visit Diagnoses       Contact dermatitis due to poison oak    -  Primary     Allergic dermatitis due to poison ivy       Relevant Medications   methylPREDNISolone  (MEDROL  DOSEPAK) 4 MG TBPK tablet   cetirizine (ZYRTEC ALLERGY) 10 MG tablet     Prediabetes         Essential hypertension, benign           Return in about 2 weeks (around 07/11/2024).   Total time spent: 30 minutes  FERNAND FREDY RAMAN, MD  06/27/2024   This document may have been prepared by [facility] Voice Recognition software and as such may include unintentional dictation errors.

## 2024-06-30 NOTE — Telephone Encounter (Signed)
 Order is being faxed back today, Alan has signed a hard copy

## 2024-07-01 ENCOUNTER — Encounter: Payer: Self-pay | Admitting: Physical Therapy

## 2024-07-01 ENCOUNTER — Ambulatory Visit: Admitting: Physical Therapy

## 2024-07-01 VITALS — BP 106/65 | HR 82

## 2024-07-01 DIAGNOSIS — M6281 Muscle weakness (generalized): Secondary | ICD-10-CM

## 2024-07-01 DIAGNOSIS — R2689 Other abnormalities of gait and mobility: Secondary | ICD-10-CM | POA: Diagnosis not present

## 2024-07-01 DIAGNOSIS — R269 Unspecified abnormalities of gait and mobility: Secondary | ICD-10-CM

## 2024-07-01 DIAGNOSIS — R2681 Unsteadiness on feet: Secondary | ICD-10-CM

## 2024-07-01 NOTE — Therapy (Signed)
 OUTPATIENT PHYSICAL THERAPY PROSTHETICS TREATMENT   Patient Name: Jeremy Padilla MRN: [id number] DOB:[birth year], 58 y.o., male Today's Date: 07/01/2024  PCP: Carin Gauze, NP REFERRING PROVIDER: Carin Gauze, NP  END OF SESSION:  PT End of Session - 07/01/24 1327     Visit Number 12    Number of Visits 17    Date for PT Re-Evaluation 07/18/24    Authorization Type Medicare A & B with secondary Medicaid    Progress Note Due on Visit 10    PT Start Time 1319    PT Stop Time 1404    PT Time Calculation (min) 45 min    Equipment Utilized During Treatment Gait belt    Activity Tolerance Patient tolerated treatment well;Other (comment)   stomach ache   Behavior During Therapy [facility] for tasks assessed/performed           Past Medical History:  Diagnosis Date   Abscess 10/05/2015   Diabetes mellitus without complication (HCC)    Heart murmur    Herpes exposure    Hyperlipidemia    Hypertension    Past Surgical History:  Procedure Laterality Date   AMPUTATION Left 06/23/2020   Procedure: AMPUTATION ABOVE KNEE;  Surgeon: Marea Selinda RAMAN, MD;  Location: [facility];  Service: General;  Laterality: Left;   LOWER EXTREMITY ANGIOGRAPHY Left 04/09/2019   Procedure: LOWER EXTREMITY ANGIOGRAPHY;  Surgeon: Marea Selinda RAMAN, MD;  Location: [facility];  Service: Cardiovascular;  Laterality: Left;   LOWER EXTREMITY ANGIOGRAPHY Left 04/19/2020   Procedure: Lower Extremity Angiography;  Surgeon: Marea Selinda RAMAN, MD;  Location: [facility];  Service: Cardiovascular;  Laterality: Left;   LOWER EXTREMITY ANGIOGRAPHY Left 04/20/2020   Procedure: Lower Extremity Angiography;  Surgeon: Marea Selinda RAMAN, MD;  Location: [facility];  Service: Cardiovascular;  Laterality: Left;   LOWER EXTREMITY INTERVENTION N/A 05/30/2019   Procedure: LOWER EXTREMITY INTERVENTION;  Surgeon: Jama Cordella MATSU, MD;  Location: [facility];  Service: Cardiovascular;  Laterality: N/A;    PENILE PROSTHESIS IMPLANT N/A 02/05/2024   Procedure: INSERTION OF INFLATABLE PENILE PROSTHESIS;  Surgeon: Lovie Arlyss CROME, MD;  Location: [facility];  Service: Urology;  Laterality: N/A;  135 MINUTES NEEDED FOR CASE   WRIST SURGERY Left    Patient Active Problem List   Diagnosis Date Noted   Skin abrasion 01/29/2024   Edema of toe 08/20/2023   Toe infection 08/20/2023   Diabetic polyneuropathy associated with diabetes mellitus due to underlying condition (HCC) 05/18/2023   Benign mole 03/20/2023   Mixed hyperlipidemia 03/20/2023   Hx of AKA (above knee amputation), left (HCC) 09/10/2020   Protein-calorie malnutrition, severe 06/23/2020   Pressure injury of skin 06/22/2020   BKA stump complication (HCC) 06/21/2020   Hyponatremia 06/21/2020   Dehydration 06/21/2020   Ischemic leg 04/20/2020   Ischemia 04/16/2020   Ischemia of left lower extremity 05/30/2019   PAD (peripheral artery disease) (HCC) 04/08/2019   Lacunar infarction (HCC) 08/03/2017   Bruit of left carotid artery 08/03/2017   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) 08/02/2017   Abscess or cellulitis, neck 10/08/2015   Hyperglycemia 10/08/2015   Hypertension goal BP (blood pressure) < 140/90 10/08/2015   Heart murmur 10/08/2015    ONSET DATE: July 2021 (initial amputation)  REFERRING DIAG: S10.387 (ICD-10-CM) - Hx of AKA (above knee amputation), left (HCC) I73.9 (ICD-10-CM) - PAD (peripheral artery disease) (HCC)  THERAPY DIAG:  Other abnormalities of gait and  mobility  Unsteadiness on feet  Muscle weakness (generalized)  Abnormality of gait and mobility  Rationale for Evaluation and Treatment: Rehabilitation  SUBJECTIVE:   SUBJECTIVE STATEMENT: He has not heard about his prosthetic socks.  He is dealing with poison oak recurrence and back on a steroid for another day or so.  Denies falls.   Pt accompanied by: self (drives himself)  PERTINENT HISTORY: HTN (goal <140/90),  DM2, lacunar infarction, PAD, heart murmur, diabetic retinopathy (treated with shots)  PAIN:  Are you having pain? 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   PATIENT GOALS: to walk better and maybe get a better prosthetic, to be more independent                                                                                                                            TREATMENT LUE in sitting prior to interventions: Today's Vitals   07/01/24 1337  BP: 106/65  Pulse: 82   -Arm bike on level 6.0 x 9 minutes (5 minutes forwards and 4 minutes backwards) cued for periscapular engagement for postural control -Adjusted walker height with improved posture and step through gait mod I x115'  -Gait training w/ [facility] SBA-CGA [phone] continuously, he has slower pace than with standalone cane but mildly improved step through pattern for initial portion of distance (~200 ft).  He has diminished step length and stride with fatigue, but improved stability and maintained upright.  Single lateral LOB approaching mat at end of distance due fatigue.  Pt able to self-right CGA.  Edu on how to safely practice with SPC at home in hallway.     PATIENT EDUCATION: Education details: continue HEP - continue STS focus and can add 4-way stepping at counter and STS w/ stride step w/ hand support - can also add hip hike and IR/ER w/ hip flexion/extension in SLS at bar as well, monitor BP; informed pt of order for socks sent to [facility] by NP and to follow-up with [facility] at end of week.  Practicing w/ SPC at home.  Schedule adjustments - cancelled 7/25 and added f/u visit next week. Person educated: Patient Education method: Explanation, Demonstration, and Handouts Education comprehension: verbalized understanding and needs further education  HOME EXERCISE PROGRAM: Access Code: [email or web address] URL: [email or web address] Date: 05/09/2024 Prepared by: Daved Bull  Exercises - Sidelying Hip Abduction wtih Flexion and Extension (BKA)  - 1 x daily - 7 x weekly - 3 sets - 10 reps - Sidelying Hip Abduction (AKA)  - 1 x daily - 7 x weekly - 3 sets - 10 reps - Sidelying Hip Circles (AKA)  - 1 x daily - 7 x weekly - 3 sets - 10 reps - Supine Psoas Stretch (AKA)  - 1 x daily - 7 x weekly - 3 sets - 10 reps  AT [facility] FIND YOUR MIDLINE POSITION AND PLACE FEET EQUAL DISTANCE FROM THE MIDLINE.  Try to  find this position when standing still for activities.    USE TAPE ON FLOOR TO MARK THE MIDLINE POSITION. You also should try to feel with your limb pressure in socket.  You are trying to feel with limb what you used to feel with the bottom of your foot.   Side to Side Shift: Moving your hips only (not shoulders): move weight onto your left leg, HOLD/FEEL.  Move back to equal weight on each leg, HOLD/FEEL. Move weight onto your right leg, HOLD/FEEL. Move back to equal weight on each leg, HOLD/FEEL. Repeat.  Start with both hands on sink, progress to right hand only, then no hands.  Front to Back Shift: Moving your hips only (not shoulders): move your weight forward onto your toes, HOLD/FEEL. Move your weight back to equal Flat Foot on both legs, HOLD/FEEL. Move your weight back onto your heels, HOLD/FEEL. Move your weight back to equal on both legs, HOLD/FEEL. Repeat.   tart with both hands on sink, progress to right hand only, then no hands. Moving Cones / Cups: With equal weight on each leg: Hold on with one hand the first time, then progress to no hand supports. Move cups from one side of sink to the other. Place cups ~2" out of your reach, progress to 10" beyond reach.  Place one hand in middle of sink and reach with other hand. Do both arms.  Then hover one hand and move cups with other hand. -just use a crossbody reach to end of safe weight shift ROM Overhead/Upward Reaching: alternated reaching up to top cabinets or ceiling if no cabinets present. Keep equal  weight on each leg. Start with one hand support on counter while other hand reaches and progress to no hand support with reaching.  ace one hand in middle of sink and reach with other hand. Do both arms.  Then hover one hand and move cups with other hand.  5.   Looking Over Shoulders: With equal weight on each leg: alternate turning to look over your shoulders with one hand support on counter as needed.  Start with head motions only to look in front of shoulder, then even with shoulder and progress to looking behind you. To look to side, move head /eyes, then shoulder on side looking pulls back, shift more weight to side looking and pull hip back. ace one hand in middle of sink and let go with other hand so your shoulder can pull back. Switch hands to look other way.   Then hover one hand and move cups with other hand.  6.  Stepping with leg that is not amputated:  Move items under cabinet out of your way. Shift your hips/pelvis so weight on prosthesis. SLOWLY step other leg so front of foot is in cabinet. Then step back to floor. - just hike the hip and place the foot back in the starting spot  ASSESSMENT:  CLINICAL IMPRESSION: Patient seen for skilled PT session with emphasis on progression to use of a [facility] for safe practice continuation at home.  He does very well from an endurance standpoint tolerating 575 ft at no more than CGA.  His mechanics are slowly improving to more consistent step through pattern.  PT to continue working on gait training and improved prosthetic management once pt obtains socks from [facility].  He is due for LTG assessment at next scheduled visit with plan for recertification w/ anticipated progress. Continue per POC.  OBJECTIVE IMPAIRMENTS: Abnormal gait, decreased activity tolerance, decreased  balance, decreased endurance, decreased knowledge of use of DME, decreased mobility, difficulty walking, decreased strength, decreased safety awareness, impaired vision/preception, improper  body mechanics, postural dysfunction, and prosthetic dependency .   ACTIVITY LIMITATIONS: carrying, lifting, bending, standing, squatting, stairs, transfers, and locomotion level  PARTICIPATION LIMITATIONS: community activity, occupation, and yard work  PERSONAL FACTORS: Past/current experiences, Time since onset of injury/illness/exacerbation, and 3+ comorbidities: lacunar infarction, HTN, diabetic retinopathy are also affecting patient's functional outcome.   REHAB POTENTIAL: Good  CLINICAL DECISION MAKING: Evolving/moderate complexity  EVALUATION COMPLEXITY: Moderate   GOALS: Goals reviewed with patient? Yes  SHORT TERM GOALS: Target date: 06/06/2024  Pt will be independent and compliant with introductory prosthetic management, strengthening and standing balance HEP in order to maintain functional progress and improve mobility. Baseline:  Ind and compliant per report 7/1 Goal status: MET  2.  Pt will be independent in sock ply adjustment in order to improve wear tolerance and prosthetic fit. Baseline: pt still unsure of ply he wears at baseline due to wear of socks, has not had much need for adjustment but understands he should add if socket is loose (7/1) Goal status: PARTIALLY MET  3.  Pt will decrease 5xSTS to </=12 seconds w/ no more than single UE support and minimum momentum to initiate transfer in order to demonstrate decreased risk for falls and improved functional bilateral LE strength and power. Baseline:  12.75 sec w/ BUE and moderate use of momentum to initiate stand (6/3); 11.47 sec w/ BUE, most momentum use on initial 2 stands (7/1) Goal status: PARTIALLY MET  4.  Pt will demonstrate a gait speed of >/=1.54 feet/sec in order to decrease risk for falls. Baseline: 1.34 ft/sec w/ 2WW SBA (6/3); 1.25 ft/sec w/ 2WW mod I (7/1) Goal status: NOT MET  5.  Pt will improve AMPPRO to >/=32/47 in order to demonstrate improved functional capacity on current prosthetic  componentry. Baseline: 27/47 (6/3); 30/47 (7/1) Goal status: IN PROGRESS  LONG TERM GOALS: Target date: 07/04/2024  Pt will be independent and compliant with advanced prosthetic management, strengthening and standing balance HEP in order to maintain functional progress and improve mobility. Baseline: To be progressed. Goal status: INITIAL  2.   5xSTS to be assessed w/ LTG set as appropriate. Baseline: Assessed 6/3 - no LTG needed at this time. Goal status: REVISED - D/C'd  3.  Pt will demonstrate a gait speed of >/=1.74 feet/sec in order to decrease risk for falls. Baseline: 1.34 ft/sec w/ 2WW SBA (6/3) Goal status: INITIAL  4.  Pt will improve AMPPRO to >/=37/47 in order to demonstrate improved functional capacity on current prosthetic componentry. Baseline: 27/47 (6/3) Goal status: INITIAL  5.  Pt will navigate 4 stairs using step-to pattern and bilateral rails at mod I level to demonstrate safety in home environment. Baseline: Pt reports ind at home Goal status: INITIAL  6.  Pt will initiate ambulation in clinic >/=50 ft with least restrictive cane option in order to demonstrate improved upright stability and prosthetic management. Baseline: Pt attempts a few feet with cane in home only Goal status: INITIAL   PLAN:  PT FREQUENCY: 2x/week  PT DURATION: 8 weeks  PLANNED INTERVENTIONS: [id number]- PT Re-evaluation, 97750- Physical Performance Testing, 97110-Therapeutic exercises, 97530- Therapeutic activity, [id number]- Neuromuscular re-education, 97535- Self Care, [id number]- Manual therapy, [id number]- Gait training, [phone]- Prosthetic Initial , [phone]- Electrical stimulation (manual), Patient/Family education, Balance training, Stair training, Vestibular training, and DME instructions  PLAN FOR NEXT SESSION:  work towards cane, progress HEP.  Sock ply adjustment - did he get socks?  Determine benefit from loftstrands vs 2WW - AD order (holding on standard walker order request)?  Did he follow-up  with [facility] regarding water /sand protection of current prosthetic? SPC cane curb/ramp/stairs;    ASSESS LTGs - Re-Cert 2x/wk for 8 wks!  If progressing on AMPPRO at LTG - contact [facility] regarding possible prosthetic changes.  Daved KATHEE Bull, PT, DPT  07/01/2024, 5:27 PM

## 2024-07-04 ENCOUNTER — Ambulatory Visit: Admitting: Physical Therapy

## 2024-07-08 ENCOUNTER — Encounter: Payer: Self-pay | Admitting: Physical Therapy

## 2024-07-08 ENCOUNTER — Ambulatory Visit: Payer: Self-pay | Admitting: Physical Therapy

## 2024-07-08 VITALS — BP 109/79 | HR 89

## 2024-07-08 DIAGNOSIS — R269 Unspecified abnormalities of gait and mobility: Secondary | ICD-10-CM

## 2024-07-08 DIAGNOSIS — R2681 Unsteadiness on feet: Secondary | ICD-10-CM

## 2024-07-08 DIAGNOSIS — R2689 Other abnormalities of gait and mobility: Secondary | ICD-10-CM | POA: Diagnosis not present

## 2024-07-08 DIAGNOSIS — M6281 Muscle weakness (generalized): Secondary | ICD-10-CM

## 2024-07-08 NOTE — Therapy (Signed)
 OUTPATIENT PHYSICAL THERAPY PROSTHETICS TREATMENT - RE-certification   Patient Name: Jeremy Padilla MRN: [id number] DOB:15-Jul-1966, 58 y.o., male Today's Date: 07/08/2024  PCP: Carin Gauze, NP REFERRING PROVIDER: Carin Gauze, NP  END OF SESSION:  PT End of Session - 07/08/24 1630     Visit Number 13    Number of Visits 25   17 + 8   Date for PT Re-Evaluation 09/19/24   pushed out due to scheduling delay   Authorization Type Medicare A & B with secondary Medicaid    Progress Note Due on Visit 20    PT Start Time 1616    PT Stop Time 1704    PT Time Calculation (min) 48 min    Equipment Utilized During Treatment Gait belt    Activity Tolerance Patient tolerated treatment well    Behavior During Therapy WFL for tasks assessed/performed           Past Medical History:  Diagnosis Date   Abscess 10/05/2015   Diabetes mellitus without complication (HCC)    Heart murmur    Herpes exposure    Hyperlipidemia    Hypertension    Past Surgical History:  Procedure Laterality Date   AMPUTATION Left 06/23/2020   Procedure: AMPUTATION ABOVE KNEE;  Surgeon: Marea Selinda RAMAN, MD;  Location: [facility];  Service: General;  Laterality: Left;   LOWER EXTREMITY ANGIOGRAPHY Left 04/09/2019   Procedure: LOWER EXTREMITY ANGIOGRAPHY;  Surgeon: Marea Selinda RAMAN, MD;  Location: [facility];  Service: Cardiovascular;  Laterality: Left;   LOWER EXTREMITY ANGIOGRAPHY Left 04/19/2020   Procedure: Lower Extremity Angiography;  Surgeon: Marea Selinda RAMAN, MD;  Location: [facility];  Service: Cardiovascular;  Laterality: Left;   LOWER EXTREMITY ANGIOGRAPHY Left 04/20/2020   Procedure: Lower Extremity Angiography;  Surgeon: Marea Selinda RAMAN, MD;  Location: [facility];  Service: Cardiovascular;  Laterality: Left;   LOWER EXTREMITY INTERVENTION N/A 05/30/2019   Procedure: LOWER EXTREMITY INTERVENTION;  Surgeon: Jama Cordella MATSU, MD;  Location: [facility];  Service:  Cardiovascular;  Laterality: N/A;   PENILE PROSTHESIS IMPLANT N/A 02/05/2024   Procedure: INSERTION OF INFLATABLE PENILE PROSTHESIS;  Surgeon: Lovie Arlyss CROME, MD;  Location: [facility];  Service: Urology;  Laterality: N/A;  135 MINUTES NEEDED FOR CASE   WRIST SURGERY Left    Patient Active Problem List   Diagnosis Date Noted   Skin abrasion 01/29/2024   Edema of toe 08/20/2023   Toe infection 08/20/2023   Diabetic polyneuropathy associated with diabetes mellitus due to underlying condition (HCC) 05/18/2023   Benign mole 03/20/2023   Mixed hyperlipidemia 03/20/2023   Hx of AKA (above knee amputation), left (HCC) 09/10/2020   Protein-calorie malnutrition, severe 06/23/2020   Pressure injury of skin 06/22/2020   BKA stump complication (HCC) 06/21/2020   Hyponatremia 06/21/2020   Dehydration 06/21/2020   Ischemic leg 04/20/2020   Ischemia 04/16/2020   Ischemia of left lower extremity 05/30/2019   PAD (peripheral artery disease) (HCC) 04/08/2019   Lacunar infarction (HCC) 08/03/2017   Bruit of left carotid artery 08/03/2017   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) 08/02/2017   Abscess or cellulitis, neck 10/08/2015   Hyperglycemia 10/08/2015   Hypertension goal BP (blood pressure) < 140/90 10/08/2015   Heart murmur 10/08/2015    ONSET DATE: July 2021 (initial amputation)  REFERRING DIAG: S10.387 (ICD-10-CM) - Hx of AKA (above knee amputation), left (HCC) I73.9 (ICD-10-CM) - PAD (peripheral artery disease) (HCC)  THERAPY DIAG:  Other abnormalities of gait and mobility  Unsteadiness on feet  Muscle weakness (generalized)  Abnormality of gait and mobility  Rationale for Evaluation and Treatment: Rehabilitation  SUBJECTIVE:   SUBJECTIVE STATEMENT: He has been stacking wood today using his outside walker to carry 5 gallon buckets to his pile.  Now he is worried about poison oak again.  Denies falls.  He has not heard about his socks yet,  but is to pick up a copy of the order to take to [place] tomorrow at lab appt. Pt accompanied by: self (drives himself)  PERTINENT HISTORY: HTN (goal <140/90), DM2, lacunar infarction, PAD, heart murmur, diabetic retinopathy (treated with shots)  PAIN:  Are you having pain? 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   PATIENT GOALS: to walk better and maybe get a better prosthetic, to be more independent                                                                                                                            TREATMENT LUE in sitting prior to interventions: Today's Vitals   07/08/24 1633  BP: 109/79  Pulse: 89   -Verbally reviewed HEP -mod I x4 stairs step to pattern bil rails - :  23.90 sec w/ 2WW mod I = 0.42 m/sec OR 1.38 ft/sec -Pt ambulates w/ [facility] x115' at [facility] level w/ step to pattern  AMPUTEE MOBILITY PREDICTOR ASSESSMENT TOOL Initial instructions: Client is seated in a hard chair with arms. The following manoeuvres are tested with or without the use of the prosthesis.  Advise the person of each task or group of tasks prior to performance.  Please avoid unnecessary chatter throughout the test.  Safety First, no task should be performed if either the tester or client is uncertain of a safe outcome.  1. Sitting Balance: Sit forward in a chair with arms folded across chest for 60s. Cannot sit upright independently for 60s Can sit upright independently for 60s = 0 = 1  1  2. Sitting reach:  Reach forwards and grasp the ruler.  (Tester holds ruler 12in beyond extended arms midline to the sternum) Does not attempt Cannot grasp or requires arm support Reaches forward and successfully grasps item.  = 0 = 1  = 2    2  3. Chair to chair transfer: 2 chairs at 90. Pt. may choose direction and use their upper limbs. Cannot do or requires physical assistance Performs independently, but appears unsteady Performs  independently, appears to be steady and safe = 0  = 1 = 2  2  4. Arises from a chair: Ask pt. to fold arms across chest and stand. If unable, use arms or assistive device. Unable without help (physical assistance) Able, uses arms/assist device to help Able, without using arms = 0  = 1 = 2   2  5. Attempts to arise from a chair: (stopwatch ready) If  attempt in no. 4. was without arms then ignore and allow another attempt without penalty. Unable without help (physical assistance) Able requires >1 attempt Able to rise one attempt = 0  = 1 = 2   2  6. Immediate Standing Balance: (first 5s) Begin timing immediately. Unsteady (staggers, moves foot, sways ) Steady using walking aid or other support Steady without walker or other support = 0 = 1  = 2  2  7. Standing Balance (30s): (stopwatch ready) For item no.'s 7 & 8, first attempt is without assistive device.  If support is required allow after first attempt Unsteady Steady but uses walking aid or other support Standing without support = 0  = 1 = 2    2  8. Single limb standing balance: (stopwatch ready) Time the duration of single limb standing on both the sound and prosthetic limb up to 30s.   Grade the quality, not the time.  *Eliminate item 8 for AMPnoPRO*  Sound side  20.53 seconds  Prosthetic side 30 seconds Non-prosthetic side Unsteady Steady but uses walking aid or other support for 30s Single-limb standing without support for 30s  Prosthetic Side Unsteady Steady but uses walking aid or other support for 30s Single-limb standing without support for 30s  = 0 = 1  = 2    = 0  = 1  = 2    1     1   9. Standing reach: Reach forward and grasp the ruler.  (Tester holds ruler 12in beyond extended arm(s) midline to the sternum) Does not attempt Cannot grasp or requires arm support on assistive device Reaches forward and successfully grasps item no support = 0  = 1  = 2   2  10. Nudge  test: With feet as close together as possible, examiner pushes lightly on pt.'s sternum with palm of hand 3 times (toes should rise) Begins to fall Staggers, grabs, catches self ore uses assistive device Steady = 0  = 1 = 2   2  11. Eyes Closed: (at maximum position #7) If support is required grade as unsteady. Unsteady or grips assistive device Steady without any use of assistive device = 0  = 1  1    12. Pick up objects off the floor: Pick up a pencil off the floor placed midline 12in in front of foot. Unable to pick up object and return to standing Performs with some help (table, chair, walking aid etc) Performs independently (without help) = 0  = 1   = 2  2  13. Sitting down:  Ask pt. to fold arms across chest and sit. If unable, use arm or assistive device. Unsafe (misjudged distance, falls into chair ) Uses arms, assistive device or not a smooth motion Safe, smooth motion = 0  = 1 = 2   2  14. Initiation of gait: (immediately after told to "go") Any hesitancy or multiple attempts to start No hesitancy = 0 = 1  1  15. Step length and height: Walk a measured distance of 47ft twice (up and back). Four scores are required or two scores (a. & b.) for each leg. "Marked deviation" is defined as extreme substitute movements to avoid clearing the floor. a. Swing Foot Does not advance a minimum of 12in Advances a minimum of 12in  b. Foot Clearance Foot does not completely clear floor without deviation Foot completely clears floor without marked deviation  = 0  = 1   = 0 =  1 Prosthesis  1   1 Sound  0   1  16. Step Continuity Stopping or discontinuity between steps (stop & go gait) Steps appear continuous = 0  = 1  0  17. Turning:  180 degree turn when returning to chair. Unable to turn, requires intervention to prevent falling Greater than three steps but completes task without intervention No more than three continuous steps with or without  assistive aid = 0  = 1  = 2    2  18. Variable cadence:  Walk a distance of 79ft fast as possible safely 4 times.  (Speeds may vary from slow to fast and fast to slow varying cadence) Unable to vary cadence in a controlled manner Asymmetrical increase in cadence controlled manner Symmetrical increase in speed in a controlled manner  = 0 = 1 = 2      1  19. Stepping over an obstacle: Place a movable box of 4in in height in the walking path. Cannot step over the box Catches foot, interrupts stride Steps over without interrupting stride = 0 = 1 = 2   1  20. Stairs (must have at least 2 steps):  Try to go up and down these stairs without holding on to the railing.  Don't hesitate to permit pt. to hold on to rail.  Safety First, if examiner feels that any risk in involved omit and score as 0.  Ascending Unsteady, cannot do One step at a time, or must hold on to railing or device Step over step, does not hold onto the railing or device  Descending Unsteady, cannot do One step at a time, or must hold on to railing or device Step over step, does not hold onto the railing or device  = 0  = 1 = 2    = 0  = 1 = 2   1     1   21. Assistive device selection:  Add points for the use of an assistive device if used for two or more items.  If testing without prosthesis use of appropriate assistive device is mandatory.   Bed bound Wheelchair / Parallel Bars Walker Crutches (axillary or forearm) Cane (straight or quad) None = 0 = 1 = 2 = 3 = 4 = 5    4    Total Score      AMPnoPRO    N/A  /43                          AMPPRO      38 /47     K LEVEL (converted from AMP score)  AMPnoPRO   K0 = (0-8)    K1= (9-20)    K2 = (21-28)    K3 = (29-36)    K4 = (37-43)  AMPPRO    K1 = (15-26)      K2 = (27-36)     K3 = (37-42)     K4 = (43-47)   PATIENT EDUCATION: Education details: continue HEP - continue STS focus and can add 4-way stepping at counter and STS w/  stride step w/ hand support - can also add hip hike and IR/ER w/ hip flexion/extension in SLS at bar as well, monitor BP; informed pt of order for socks sent to [facility] by NP and to follow-up with [facility] at end of week.  Practicing w/ SPC at home.  Schedule adjustments - cancelled 7/25 and added f/u visit  next week. Person educated: Patient Education method: Explanation, Demonstration, and Handouts Education comprehension: verbalized understanding and needs further education  HOME EXERCISE PROGRAM: Access Code: [email or web address] URL: [email or web address] Date: 05/09/2024 Prepared by: Daved Bull  Exercises - Sidelying Hip Abduction wtih Flexion and Extension (BKA)  - 1 x daily - 7 x weekly - 3 sets - 10 reps - Sidelying Hip Abduction (AKA)  - 1 x daily - 7 x weekly - 3 sets - 10 reps - Sidelying Hip Circles (AKA)  - 1 x daily - 7 x weekly - 3 sets - 10 reps - Supine Psoas Stretch (AKA)  - 1 x daily - 7 x weekly - 3 sets - 10 reps  AT [facility] FIND YOUR MIDLINE POSITION AND PLACE FEET EQUAL DISTANCE FROM THE MIDLINE.  Try to find this position when standing still for activities.    USE TAPE ON FLOOR TO MARK THE MIDLINE POSITION. You also should try to feel with your limb pressure in socket.  You are trying to feel with limb what you used to feel with the bottom of your foot.   Side to Side Shift: Moving your hips only (not shoulders): move weight onto your left leg, HOLD/FEEL.  Move back to equal weight on each leg, HOLD/FEEL. Move weight onto your right leg, HOLD/FEEL. Move back to equal weight on each leg, HOLD/FEEL. Repeat.  Start with both hands on sink, progress to right hand only, then no hands.  Front to Back Shift: Moving your hips only (not shoulders): move your weight forward onto your toes, HOLD/FEEL. Move your weight back to equal Flat Foot on both legs, HOLD/FEEL. Move your weight back onto your heels, HOLD/FEEL. Move your weight back to equal on both legs, HOLD/FEEL.  Repeat.   tart with both hands on sink, progress to right hand only, then no hands. Moving Cones / Cups: With equal weight on each leg: Hold on with one hand the first time, then progress to no hand supports. Move cups from one side of sink to the other. Place cups ~2" out of your reach, progress to 10" beyond reach.  Place one hand in middle of sink and reach with other hand. Do both arms.  Then hover one hand and move cups with other hand. -just use a crossbody reach to end of safe weight shift ROM Overhead/Upward Reaching: alternated reaching up to top cabinets or ceiling if no cabinets present. Keep equal weight on each leg. Start with one hand support on counter while other hand reaches and progress to no hand support with reaching.  ace one hand in middle of sink and reach with other hand. Do both arms.  Then hover one hand and move cups with other hand.  5.   Looking Over Shoulders: With equal weight on each leg: alternate turning to look over your shoulders with one hand support on counter as needed.  Start with head motions only to look in front of shoulder, then even with shoulder and progress to looking behind you. To look to side, move head /eyes, then shoulder on side looking pulls back, shift more weight to side looking and pull hip back. ace one hand in middle of sink and let go with other hand so your shoulder can pull back. Switch hands to look other way.   Then hover one hand and move cups with other hand.  6.  Stepping with leg that is not amputated:  Move items under cabinet out of your way. Shift your  hips/pelvis so weight on prosthesis. SLOWLY step other leg so front of foot is in cabinet. Then step back to floor. - just hike the hip and place the foot back in the starting spot  ASSESSMENT:  CLINICAL IMPRESSION: Assessed LTG this visit with pt making great progress towards goals as written.  Planning for re-certification to further work on community distance and Multimedia programmer  with SPC option.  Pt has made great progress in his use of cane in home and was encouraged to continue this practice.  His prosthetic componentry limits him with community obstacles like stairs and inclines and based on progression to K3 level with AMPPRO assessment this visit he may benefit from componentry upgrades so PT will reach out to prosthetist regarding consultation for this.  Continue per POC.  OBJECTIVE IMPAIRMENTS: Abnormal gait, decreased activity tolerance, decreased balance, decreased endurance, decreased knowledge of use of DME, decreased mobility, difficulty walking, decreased strength, decreased safety awareness, impaired vision/preception, improper body mechanics, postural dysfunction, and prosthetic dependency .   ACTIVITY LIMITATIONS: carrying, lifting, bending, standing, squatting, stairs, transfers, and locomotion level  PARTICIPATION LIMITATIONS: community activity, occupation, and yard work  PERSONAL FACTORS: Past/current experiences, Time since onset of injury/illness/exacerbation, and 3+ comorbidities: lacunar infarction, HTN, diabetic retinopathy are also affecting patient's functional outcome.   REHAB POTENTIAL: Good  CLINICAL DECISION MAKING: Evolving/moderate complexity  EVALUATION COMPLEXITY: Moderate   GOALS: Goals reviewed with patient? Yes  SHORT TERM GOALS: Target date: 06/06/2024  Pt will be independent and compliant with introductory prosthetic management, strengthening and standing balance HEP in order to maintain functional progress and improve mobility. Baseline:  Ind and compliant per report 7/1 Goal status: MET  2.  Pt will be independent in sock ply adjustment in order to improve wear tolerance and prosthetic fit. Baseline: pt still unsure of ply he wears at baseline due to wear of socks, has not had much need for adjustment but understands he should add if socket is loose (7/1) Goal status: PARTIALLY MET  3.  Pt will decrease 5xSTS to </=12  seconds w/ no more than single UE support and minimum momentum to initiate transfer in order to demonstrate decreased risk for falls and improved functional bilateral LE strength and power. Baseline:  12.75 sec w/ BUE and moderate use of momentum to initiate stand (6/3); 11.47 sec w/ BUE, most momentum use on initial 2 stands (7/1) Goal status: PARTIALLY MET  4.  Pt will demonstrate a gait speed of >/=1.54 feet/sec in order to decrease risk for falls. Baseline: 1.34 ft/sec w/ 2WW SBA (6/3); 1.25 ft/sec w/ 2WW mod I (7/1) Goal status: NOT MET  5.  Pt will improve AMPPRO to >/=32/47 in order to demonstrate improved functional capacity on current prosthetic componentry. Baseline: 27/47 (6/3); 30/47 (7/1) Goal status: IN PROGRESS  LONG TERM GOALS: Target date: 07/04/2024  Pt will be independent and compliant with advanced prosthetic management, strengthening and standing balance HEP in order to maintain functional progress and improve mobility. Baseline: Reports ind and compliance (7/29) Goal status: MET  2.   5xSTS to be assessed w/ LTG set as appropriate. Baseline: Assessed 6/3 - no LTG needed at this time. Goal status: REVISED - D/C'd  3.  Pt will demonstrate a gait speed of >/=1.74 feet/sec in order to decrease risk for falls. Baseline: 1.34 ft/sec w/ 2WW SBA (6/3); 1.38 ft/sec (7/29) Goal status: NOT MET  4.  Pt will improve AMPPRO to >/=37/47 in order to demonstrate improved functional capacity on current  prosthetic componentry. Baseline: 27/47 (6/3); 38/47 (7/29) Goal status: MET  5.  Pt will navigate 4 stairs using step-to pattern and bilateral rails at mod I level to demonstrate safety in home environment. Baseline: Pt reports ind at home; mod I x4 stairs step to (7/29) Goal status:  MET  6.  Pt will initiate ambulation in clinic >/=50 ft with least restrictive cane option in order to demonstrate improved upright stability and prosthetic management. Baseline: Pt attempts a few  feet with cane in home only; [facility] x115' at [facility] (7/29) Goal status: MET  GOALS (at 2/70 re-cert): Goals reviewed with patient? Yes  SHORT TERM GOALS: Target date: 08/08/2024  1.  Pt will be independent in sock ply adjustment in order to improve wear tolerance and prosthetic fit. Baseline: pt waiting to obtain new socks (7/29) Goal status: INITIAL  2.  Pt will demonstrate a gait speed of >/=1.54 feet/sec in order to decrease risk for falls. Baseline: 1.38 ft/sec (7/29) Goal status: INITIAL  3.  Pt will perform curb step using least restrictive cane option at no more than SBA level in order to improve community obstacle management. Baseline: CGA-minA Goal status: INITIAL  LONG TERM GOALS: Target date: 09/05/2024  1.  Pt will demonstrate a gait speed of >/=1.74 feet/sec in order to decrease risk for falls. Baseline: 1.38 ft/sec (7/29) Goal status:  INITIAL  2.  Pt will ambulate up and down ramp w/ SPC at no more than SBA in order to demonstrate improved community management. Baseline: CGA-minA Goal status: INITIAL  3.  Pt will navigate 4 stairs using step-to pattern unilateral rail and cane as needed at mod I level to demonstrate safety in home and community environments. Baseline: mod I x4 stairs step to bil rails (7/29) Goal status:  INITIAL  4.  Pt will ambulate >/=500 feet over level and unlevel ground with least restrictive cane option and no more than SBA level of assist to promote household and community access. Baseline: SPC x115' at [facility] level surface only (7/29) Goal status: INITIAL  PLAN:  PT FREQUENCY: 2x/week + 1x/wk  PT DURATION: 8 weeks + 8 weeks  PLANNED INTERVENTIONS: [id number]- PT Re-evaluation, 97750- Physical Performance Testing, 97110-Therapeutic exercises, 97530- Therapeutic activity, [id number]- Neuromuscular re-education, 97535- Self Care, [id number]- Manual therapy, [id number]- Gait training, [phone]- Prosthetic Initial , [phone]- Electrical stimulation (manual),  Patient/Family education, Balance training, Stair training, Vestibular training, and DME instructions  PLAN FOR NEXT SESSION:  work towards cane, progress HEP.  Sock ply adjustment - did he get socks?  Determine benefit from loftstrands vs 2WW - AD order (holding on standard walker order request)?  SPC cane curb/ramp/stairs  Daved KATHEE Bull, PT, DPT  07/08/2024, 6:21 PM

## 2024-07-09 ENCOUNTER — Other Ambulatory Visit

## 2024-07-09 DIAGNOSIS — I259 Chronic ischemic heart disease, unspecified: Secondary | ICD-10-CM

## 2024-07-09 DIAGNOSIS — I739 Peripheral vascular disease, unspecified: Secondary | ICD-10-CM

## 2024-07-09 DIAGNOSIS — R0602 Shortness of breath: Secondary | ICD-10-CM

## 2024-07-09 DIAGNOSIS — I998 Other disorder of circulatory system: Secondary | ICD-10-CM

## 2024-07-09 DIAGNOSIS — E782 Mixed hyperlipidemia: Secondary | ICD-10-CM

## 2024-07-09 DIAGNOSIS — I10 Essential (primary) hypertension: Secondary | ICD-10-CM

## 2024-07-09 DIAGNOSIS — E0842 Diabetes mellitus due to underlying condition with diabetic polyneuropathy: Secondary | ICD-10-CM

## 2024-07-09 DIAGNOSIS — R9439 Abnormal result of other cardiovascular function study: Secondary | ICD-10-CM

## 2024-07-10 ENCOUNTER — Ambulatory Visit: Payer: Self-pay | Admitting: Cardiology

## 2024-07-10 LAB — CMP14+EGFR
ALT: 16 IU/L (ref 0–44)
AST: 19 IU/L (ref 0–40)
Albumin: 4.5 g/dL (ref 3.8–4.9)
Alkaline Phosphatase: 117 IU/L (ref 44–121)
BUN/Creatinine Ratio: 29 — ABNORMAL HIGH (ref 9–20)
BUN: 32 mg/dL — ABNORMAL HIGH (ref 6–24)
Bilirubin Total: 0.6 mg/dL (ref 0.0–1.2)
CO2: 22 mmol/L (ref 20–29)
Calcium: 9.7 mg/dL (ref 8.7–10.2)
Chloride: 100 mmol/L (ref 96–106)
Creatinine, Ser: 1.09 mg/dL (ref 0.76–1.27)
Globulin, Total: 1.9 g/dL (ref 1.5–4.5)
Glucose: 126 mg/dL — ABNORMAL HIGH (ref 70–99)
Potassium: 4.2 mmol/L (ref 3.5–5.2)
Sodium: 137 mmol/L (ref 134–144)
Total Protein: 6.4 g/dL (ref 6.0–8.5)
eGFR: 78 mL/min/1.73 (ref >59)

## 2024-07-10 LAB — CBC WITH DIFFERENTIAL/PLATELET
Basophils Absolute: 0 x10E3/uL (ref 0.0–0.2)
Basos: 1 %
EOS (ABSOLUTE): 0.2 x10E3/uL (ref 0.0–0.4)
Eos: 3 %
Hematocrit: 46.5 % (ref 37.5–51.0)
Hemoglobin: 15 g/dL (ref 13.0–17.7)
Immature Grans (Abs): 0 x10E3/uL (ref 0.0–0.1)
Immature Granulocytes: 0 %
Lymphocytes Absolute: 1.9 x10E3/uL (ref 0.7–3.1)
Lymphs: 34 %
MCH: 29.5 pg (ref 26.6–33.0)
MCHC: 32.3 g/dL (ref 31.5–35.7)
MCV: 91 fL (ref 79–97)
Monocytes Absolute: 0.4 x10E3/uL (ref 0.1–0.9)
Monocytes: 7 %
Neutrophils Absolute: 3.1 x10E3/uL (ref 1.4–7.0)
Neutrophils: 55 %
Platelets: 211 x10E3/uL (ref 150–450)
RBC: 5.09 x10E6/uL (ref 4.14–5.80)
RDW: 13.1 % (ref 11.6–15.4)
WBC: 5.5 x10E3/uL (ref 3.4–10.8)

## 2024-07-10 LAB — LIPID PANEL
Chol/HDL Ratio: 3.6 ratio (ref 0.0–5.0)
Cholesterol, Total: 206 mg/dL — ABNORMAL HIGH (ref 100–199)
HDL: 58 mg/dL (ref >39)
LDL Chol Calc (NIH): 136 mg/dL — ABNORMAL HIGH (ref 0–99)
Triglycerides: 69 mg/dL (ref 0–149)
VLDL Cholesterol Cal: 12 mg/dL (ref 5–40)

## 2024-07-10 LAB — HEMOGLOBIN A1C
Est. average glucose Bld gHb Est-mCnc: 140 mg/dL
Hgb A1c MFr Bld: 6.5 % — ABNORMAL HIGH (ref 4.8–5.6)

## 2024-07-10 LAB — TSH: TSH: 0.682 u[IU]/mL (ref 0.450–4.500)

## 2024-07-11 ENCOUNTER — Encounter: Payer: Self-pay | Admitting: Cardiology

## 2024-07-11 ENCOUNTER — Ambulatory Visit: Admitting: Cardiology

## 2024-07-11 VITALS — BP 126/72 | HR 89 | Ht 74.0 in | Wt 143.6 lb

## 2024-07-11 DIAGNOSIS — E782 Mixed hyperlipidemia: Secondary | ICD-10-CM | POA: Diagnosis not present

## 2024-07-11 DIAGNOSIS — I10 Essential (primary) hypertension: Secondary | ICD-10-CM | POA: Diagnosis not present

## 2024-07-11 DIAGNOSIS — E0842 Diabetes mellitus due to underlying condition with diabetic polyneuropathy: Secondary | ICD-10-CM | POA: Diagnosis not present

## 2024-07-11 MED ORDER — GLIPIZIDE ER 5 MG PO TB24: 5.0000 mg | ORAL_TABLET | Freq: Every day | ORAL | 4 refills | Status: AC

## 2024-07-11 MED ORDER — PRAVASTATIN SODIUM 10 MG PO TABS: 10.0000 mg | ORAL_TABLET | Freq: Every day | ORAL | 5 refills | Status: DC

## 2024-07-11 NOTE — Progress Notes (Signed)
 Established Patient Office Visit  Subjective:  Patient ID: Jeremy Padilla, male    DOB: 1966-02-06  Age: 58 y.o. MRN: [id number]  Chief Complaint  Patient presents with   Follow-up    2 week follow up.     Patient in office for 2 week follow up, discuss recent lab work. Patient doing well, no complaints today. States poison ivy has cleared up.  Discussed recent lab work. Hgb A1c elevated, will restart glipizide . Did not tolerate metformin . LDL elevated, intolerant to atorvastatin  and rosuvastatin, will try pravastatin.  Will recheck in 3 months.     No other concerns at this time.   Past Medical History:  Diagnosis Date   Abscess 10/05/2015   Diabetes mellitus without complication (HCC)    Heart murmur    Herpes exposure    Hyperlipidemia    Hypertension     Past Surgical History:  Procedure Laterality Date   AMPUTATION Left 06/23/2020   Procedure: AMPUTATION ABOVE KNEE;  Surgeon: Marea Selinda RAMAN, MD;  Location: [facility];  Service: General;  Laterality: Left;   LOWER EXTREMITY ANGIOGRAPHY Left 04/09/2019   Procedure: LOWER EXTREMITY ANGIOGRAPHY;  Surgeon: Marea Selinda RAMAN, MD;  Location: [facility];  Service: Cardiovascular;  Laterality: Left;   LOWER EXTREMITY ANGIOGRAPHY Left 04/19/2020   Procedure: Lower Extremity Angiography;  Surgeon: Marea Selinda RAMAN, MD;  Location: [facility];  Service: Cardiovascular;  Laterality: Left;   LOWER EXTREMITY ANGIOGRAPHY Left 04/20/2020   Procedure: Lower Extremity Angiography;  Surgeon: Marea Selinda RAMAN, MD;  Location: [facility];  Service: Cardiovascular;  Laterality: Left;   LOWER EXTREMITY INTERVENTION N/A 05/30/2019   Procedure: LOWER EXTREMITY INTERVENTION;  Surgeon: Jama Cordella MATSU, MD;  Location: [facility];  Service: Cardiovascular;  Laterality: N/A;   PENILE PROSTHESIS IMPLANT N/A 02/05/2024   Procedure: INSERTION OF INFLATABLE PENILE PROSTHESIS;  Surgeon: Lovie Arlyss CROME, MD;  Location: [facility];   Service: Urology;  Laterality: N/A;  135 MINUTES NEEDED FOR CASE   WRIST SURGERY Left     Social History   Socioeconomic History   Marital status: Widowed    Spouse name: Not on file   Number of children: Not on file   Years of education: Not on file   Highest education level: Not on file  Occupational History   Occupation: applied for disability  Tobacco Use   Smoking status: Never    Passive exposure: Never   Smokeless tobacco: Never  Vaping Use   Vaping status: Never Used  Substance and Sexual Activity   Alcohol use: Yes    Alcohol/week: 1.0 standard drink of alcohol    Types: 1 Glasses of wine per week    Comment: 1 occasionally   Drug use: No   Sexual activity: Yes  Other Topics Concern   Not on file  Social History Narrative   Wife just passed away 04/10/2009   Social Drivers of Health   Financial Resource Strain: Low Risk  (06/01/2020)   Received from [facility]   Overall Financial Resource Strain (CARDIA)    Difficulty of Paying Living Expenses: Not hard at all  Food Insecurity: No Food Insecurity (06/01/2020)   Received from [facility]   Hunger Vital Sign    Within the past 12 months, you worried that your food would run out before you got the money to buy more.: Never true    Within the past 12 months, the food you bought  just didn't last and you didn't have money to get more.: Never true  Transportation Needs: No Transportation Needs (06/01/2020)   Received from [facility] - [facility]    Lack of Transportation (Medical): No    Lack of Transportation (Non-Medical): No  Physical Activity: Not on file  Stress: Not on file  Social Connections: Not on file  Intimate Partner Violence: Not on file    Family History  Problem Relation Age of Onset   Heart disease Father    Hyperlipidemia Father    Hypertension Father     Allergies  Allergen Reactions   Penicillins Rash    .Has patient had a PCN reaction causing immediate  rash, facial/tongue/throat swelling, SOB or lightheadedness with hypotension: Unknown Has patient had a PCN reaction causing severe rash involving mucus membranes or skin necrosis: Unknown Has patient had a PCN reaction that required hospitalization: Unknown Has patient had a PCN reaction occurring within the last 10 years: Unknown If all of the above answers are NO, then may proceed with Cephalosporin use.  Patient Tolerates Cephalosporins    Statins Other (See Comments)    Outpatient Medications Prior to Visit  Medication Sig   aspirin  EC 81 MG tablet Take 81 mg by mouth daily.   cetirizine  (ZYRTEC  ALLERGY) 10 MG tablet Take 1 tablet (10 mg total) by mouth daily.   Cholecalciferol (VITAMIN D-3) 125 MCG (5000 UT) TABS Take 5,000 Units by mouth daily.   lisinopril  (ZESTRIL ) 40 MG tablet Take 1 tablet (40 mg total) by mouth daily.   Magnesium  250 MG TABS Take 250 mg by mouth daily.   [DISCONTINUED] hydrOXYzine  (ATARAX ) 25 MG tablet Take 2 tablets (50 mg total) by mouth every 6 (six) hours as needed for itching. (Patient not taking: Reported on 07/11/2024)   [DISCONTINUED] methylPREDNISolone  (MEDROL  DOSEPAK) 4 MG TBPK tablet Use as directed (Patient not taking: Reported on 07/11/2024)   No facility-administered medications prior to visit.    Review of Systems  Constitutional: Negative.   HENT: Negative.    Eyes: Negative.   Respiratory: Negative.  Negative for shortness of breath.   Cardiovascular: Negative.  Negative for chest pain.  Gastrointestinal: Negative.  Negative for abdominal pain, constipation and diarrhea.  Genitourinary: Negative.   Musculoskeletal:  Negative for joint pain and myalgias.  Skin: Negative.   Neurological: Negative.  Negative for dizziness and headaches.  Endo/Heme/Allergies: Negative.   All other systems reviewed and are negative.      Objective:   BP 126/72   Pulse 89   Ht 6' 2 (1.88 m)   Wt 143 lb 9.6 oz (65.1 kg)   SpO2 99%   BMI 18.44 kg/m    Vitals:   07/11/24 1033  BP: 126/72  Pulse: 89  Height: 6' 2 (1.88 m)  Weight: 143 lb 9.6 oz (65.1 kg)  SpO2: 99%  BMI (Calculated): 18.43    Physical Exam Nursing note reviewed.  Constitutional:      Appearance: Normal appearance. He is normal weight.  HENT:     Head: Normocephalic and atraumatic.     Nose: Nose normal.     Mouth/Throat:     Mouth: Mucous membranes are moist.     Pharynx: Oropharynx is clear.  Eyes:     Extraocular Movements: Extraocular movements intact.     Conjunctiva/sclera: Conjunctivae normal.     Pupils: Pupils are equal, round, and reactive to light.  Cardiovascular:     Rate and Rhythm: Normal rate  and regular rhythm.     Pulses: Normal pulses.     Heart sounds: Murmur heard.  Pulmonary:     Effort: Pulmonary effort is normal.     Breath sounds: Normal breath sounds.  Abdominal:     General: Abdomen is flat. Bowel sounds are normal.     Palpations: Abdomen is soft.  Musculoskeletal:        General: Normal range of motion.     Cervical back: Normal range of motion.  Skin:    General: Skin is warm and dry.  Neurological:     General: No focal deficit present.     Mental Status: He is alert and oriented to person, place, and time.  Psychiatric:        Mood and Affect: Mood normal.        Behavior: Behavior normal.        Thought Content: Thought content normal.        Judgment: Judgment normal.      No results found for any visits on 07/11/24.  Recent Results (from the past 2160 hours)  CMP14+EGFR     Status: Abnormal   Collection Time: 07/09/24  9:37 AM  Result Value Ref Range   Glucose 126 (H) 70 - 99 mg/dL   BUN 32 (H) 6 - 24 mg/dL   Creatinine, Ser 8.90 0.76 - 1.27 mg/dL   eGFR 78 >40 fO/fpw/8.26   BUN/Creatinine Ratio 29 (H) 9 - 20   Sodium 137 134 - 144 mmol/L   Potassium 4.2 3.5 - 5.2 mmol/L   Chloride 100 96 - 106 mmol/L   CO2 22 20 - 29 mmol/L   Calcium  9.7 8.7 - 10.2 mg/dL   Total Protein 6.4 6.0 - 8.5 g/dL    Albumin 4.5 3.8 - 4.9 g/dL   Globulin, Total 1.9 1.5 - 4.5 g/dL   Bilirubin Total 0.6 0.0 - 1.2 mg/dL   Alkaline Phosphatase 117 44 - 121 IU/L   AST 19 0 - 40 IU/L   ALT 16 0 - 44 IU/L  Lipid panel     Status: Abnormal   Collection Time: 07/09/24  9:37 AM  Result Value Ref Range   Cholesterol, Total 206 (H) 100 - 199 mg/dL   Triglycerides 69 0 - 149 mg/dL   HDL 58 >60 mg/dL   VLDL Cholesterol Cal 12 5 - 40 mg/dL   LDL Chol Calc (NIH) 863 (H) 0 - 99 mg/dL   Chol/HDL Ratio 3.6 0.0 - 5.0 ratio    Comment:                                   T. Chol/HDL Ratio                                             Men  Women                               1/2 Avg.Risk  3.4    3.3                                   Avg.Risk  5.0    4.4  2X Avg.Risk  9.6    7.1                                3X Avg.Risk 23.4   11.0   CBC with Differential/Platelet     Status: None   Collection Time: 07/09/24  9:37 AM  Result Value Ref Range   WBC 5.5 3.4 - 10.8 x10E3/uL   RBC 5.09 4.14 - 5.80 x10E6/uL   Hemoglobin 15.0 13.0 - 17.7 g/dL   Hematocrit 53.4 62.4 - 51.0 %   MCV 91 79 - 97 fL   MCH 29.5 26.6 - 33.0 pg   MCHC 32.3 31.5 - 35.7 g/dL   RDW 86.8 88.3 - 84.5 %   Platelets 211 150 - 450 x10E3/uL   Neutrophils 55 Not Estab. %   Lymphs 34 Not Estab. %   Monocytes 7 Not Estab. %   Eos 3 Not Estab. %   Basos 1 Not Estab. %   Neutrophils Absolute 3.1 1.4 - 7.0 x10E3/uL   Lymphocytes Absolute 1.9 0.7 - 3.1 x10E3/uL   Monocytes Absolute 0.4 0.1 - 0.9 x10E3/uL   EOS (ABSOLUTE) 0.2 0.0 - 0.4 x10E3/uL   Basophils Absolute 0.0 0.0 - 0.2 x10E3/uL   Immature Granulocytes 0 Not Estab. %   Immature Grans (Abs) 0.0 0.0 - 0.1 x10E3/uL  Hemoglobin A1c     Status: Abnormal   Collection Time: 07/09/24  9:37 AM  Result Value Ref Range   Hgb A1c MFr Bld 6.5 (H) 4.8 - 5.6 %    Comment:          Prediabetes: 5.7 - 6.4          Diabetes: >6.4          Glycemic control for adults with diabetes:  <7.0    Est. average glucose Bld gHb Est-mCnc 140 mg/dL  TSH     Status: None   Collection Time: 07/09/24  9:37 AM  Result Value Ref Range   TSH 0.682 0.450 - 4.500 uIU/mL      Assessment & Plan:  Glipizide  for DM Pravastatin for elevated LDL  Problem List Items Addressed This Visit       Cardiovascular and Mediastinum   Hypertension goal BP (blood pressure) < 140/90 - Primary   Relevant Medications   pravastatin (PRAVACHOL) 10 MG tablet     Endocrine   Diabetic polyneuropathy associated with diabetes mellitus due to underlying condition (HCC)   Relevant Medications   glipiZIDE  (GLUCOTROL  XL) 5 MG 24 hr tablet   pravastatin (PRAVACHOL) 10 MG tablet     Other   Mixed hyperlipidemia   Relevant Medications   pravastatin (PRAVACHOL) 10 MG tablet    Return in about 4 months (around 11/10/2024) for fasting labs prior.   Total time spent: 25 minutes  [employer], NP  07/11/2024   This document may have been prepared by Dragon Voice Recognition software and as such may include unintentional dictation errors.

## 2024-07-15 ENCOUNTER — Ambulatory Visit: Admitting: Dermatology

## 2024-07-16 ENCOUNTER — Encounter: Payer: Self-pay | Admitting: Physical Therapy

## 2024-07-16 ENCOUNTER — Ambulatory Visit: Attending: Cardiology | Admitting: Physical Therapy

## 2024-07-16 VITALS — BP 176/59 | HR 80

## 2024-07-16 DIAGNOSIS — R2689 Other abnormalities of gait and mobility: Secondary | ICD-10-CM | POA: Diagnosis present

## 2024-07-16 DIAGNOSIS — R2681 Unsteadiness on feet: Secondary | ICD-10-CM | POA: Insufficient documentation

## 2024-07-16 DIAGNOSIS — R269 Unspecified abnormalities of gait and mobility: Secondary | ICD-10-CM | POA: Insufficient documentation

## 2024-07-16 DIAGNOSIS — M6281 Muscle weakness (generalized): Secondary | ICD-10-CM | POA: Diagnosis present

## 2024-07-16 NOTE — Therapy (Signed)
 OUTPATIENT PHYSICAL THERAPY PROSTHETICS TREATMENT   Patient Name: Greydon Betke MRN: [id number] DOB:1966-08-22, 58 y.o., male Today's Date: 07/16/2024  PCP: Carin Gauze, NP REFERRING PROVIDER: Carin Gauze, NP  END OF SESSION:  PT End of Session - 07/16/24 1617     Visit Number 14    Number of Visits 25   17 + 8   Date for PT Re-Evaluation 09/19/24   pushed out due to scheduling delay   Authorization Type Medicare A & B with secondary Medicaid    Progress Note Due on Visit 20    PT Start Time 1616    PT Stop Time 1700    PT Time Calculation (min) 44 min    Equipment Utilized During Treatment Gait belt    Activity Tolerance Patient tolerated treatment well    Behavior During Therapy WFL for tasks assessed/performed           Past Medical History:  Diagnosis Date   Abscess 10/05/2015   Diabetes mellitus without complication (HCC)    Heart murmur    Herpes exposure    Hyperlipidemia    Hypertension    Past Surgical History:  Procedure Laterality Date   AMPUTATION Left 06/23/2020   Procedure: AMPUTATION ABOVE KNEE;  Surgeon: Marea Selinda RAMAN, MD;  Location: [facility];  Service: General;  Laterality: Left;   LOWER EXTREMITY ANGIOGRAPHY Left 04/09/2019   Procedure: LOWER EXTREMITY ANGIOGRAPHY;  Surgeon: Marea Selinda RAMAN, MD;  Location: [facility];  Service: Cardiovascular;  Laterality: Left;   LOWER EXTREMITY ANGIOGRAPHY Left 04/19/2020   Procedure: Lower Extremity Angiography;  Surgeon: Marea Selinda RAMAN, MD;  Location: [facility];  Service: Cardiovascular;  Laterality: Left;   LOWER EXTREMITY ANGIOGRAPHY Left 04/20/2020   Procedure: Lower Extremity Angiography;  Surgeon: Marea Selinda RAMAN, MD;  Location: [facility];  Service: Cardiovascular;  Laterality: Left;   LOWER EXTREMITY INTERVENTION N/A 05/30/2019   Procedure: LOWER EXTREMITY INTERVENTION;  Surgeon: Jama Cordella MATSU, MD;  Location: [facility];  Service: Cardiovascular;  Laterality:  N/A;   PENILE PROSTHESIS IMPLANT N/A 02/05/2024   Procedure: INSERTION OF INFLATABLE PENILE PROSTHESIS;  Surgeon: Lovie Arlyss CROME, MD;  Location: [facility];  Service: Urology;  Laterality: N/A;  135 MINUTES NEEDED FOR CASE   WRIST SURGERY Left    Patient Active Problem List   Diagnosis Date Noted   Skin abrasion 01/29/2024   Edema of toe 08/20/2023   Toe infection 08/20/2023   Diabetic polyneuropathy associated with diabetes mellitus due to underlying condition (HCC) 05/18/2023   Benign mole 03/20/2023   Mixed hyperlipidemia 03/20/2023   Hx of AKA (above knee amputation), left (HCC) 09/10/2020   Protein-calorie malnutrition, severe 06/23/2020   Pressure injury of skin 06/22/2020   BKA stump complication (HCC) 06/21/2020   Hyponatremia 06/21/2020   Dehydration 06/21/2020   Ischemic leg 04/20/2020   Ischemia 04/16/2020   Ischemia of left lower extremity 05/30/2019   PAD (peripheral artery disease) (HCC) 04/08/2019   Lacunar infarction (HCC) 08/03/2017   Bruit of left carotid artery 08/03/2017   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) 08/02/2017   Abscess or cellulitis, neck 10/08/2015   Hyperglycemia 10/08/2015   Hypertension goal BP (blood pressure) < 140/90 10/08/2015   Heart murmur 10/08/2015    ONSET DATE: July 2021 (initial amputation)  REFERRING DIAG: S10.387 (ICD-10-CM) - Hx of AKA (above knee amputation), left (HCC) I73.9 (ICD-10-CM) - PAD (peripheral artery disease) (HCC)  THERAPY  DIAG:  Other abnormalities of gait and mobility  Unsteadiness on feet  Muscle weakness (generalized)  Abnormality of gait and mobility  Rationale for Evaluation and Treatment: Rehabilitation  SUBJECTIVE:   SUBJECTIVE STATEMENT: Denies falls.  He has an appt with Garrel at [place] on 8/19 about his socks.  He reports his PCP put him on cholesterol and diabetes medication. Pt accompanied by: self (drives himself)  PERTINENT HISTORY: HTN (goal  <140/90), DM2, lacunar infarction, PAD, heart murmur, diabetic retinopathy (treated with shots)  PAIN:  Are you having pain? 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  Cues for AD approximation to prevent excessive forward lean.  CURB:  Level of Assistance: CGA Assistive device utilized: Single point cane Curb Comments: Performed x4 w/ cues for approximation to step off to improve eccentric quad control.  STAIRS:  Level of Assistance: CGA and Min A  Stair Negotiation Technique: Step to Pattern Forwards With use of AD: SPC with No Rails  Number of Stairs: 2x4   Height of Stairs: 6  Comments: Cues for sequencing, cane placement for improved stability on descent, facilitation of R hip forward in stance especially with descent.  Pt greatly fatigued in R quad following task - increased difficulty w/ concentric activation w/ fatigue.  GAIT: Gait pattern: step to pattern, step through pattern, decreased arm swing- Left, decreased step length- Right, decreased stance time- Left, decreased stride length, trunk flexed, and narrow BOS Distance walked: various clinic distances Assistive device utilized: Single point cane Level of assistance: CGA Comments: Cues to continue working on stride and upright posture.  -Arm bike at level 6.0 x4 minutes forwards and x4 minutes backwards for cardiovascular endurance and periscapular strength for  postural support.  PATIENT EDUCATION: Education details: continue HEP - continue STS focus and can add 4-way stepping at counter and STS w/ stride step w/ hand support - can also add hip hike and IR/ER w/ hip flexion/extension in SLS at bar as well, monitor BP; ask [employer] about prosthetic component upgrades.  Practicing w/ SPC at home - focus on walking level surfaces for now. Person educated: Patient Education method: Explanation, Demonstration, and Handouts Education comprehension: verbalized understanding and needs further education  HOME EXERCISE PROGRAM: Access Code: [email or web address] URL: [email or web address] Date: 05/09/2024 Prepared by: Daved Bull  Exercises - Sidelying Hip Abduction wtih Flexion and  Extension (BKA)  - 1 x daily - 7 x weekly - 3 sets - 10 reps - Sidelying Hip Abduction (AKA)  - 1 x daily - 7 x weekly - 3 sets - 10 reps - Sidelying Hip Circles (AKA)  - 1 x daily - 7 x weekly - 3 sets - 10 reps - Supine Psoas Stretch (AKA)  - 1 x daily - 7 x weekly - 3 sets - 10 reps  AT [facility] FIND YOUR MIDLINE POSITION AND PLACE FEET EQUAL DISTANCE FROM THE MIDLINE.  Try to find this position when standing still for activities.    USE TAPE ON FLOOR TO MARK THE MIDLINE POSITION. You also should try to feel with your limb pressure in socket.  You are trying to feel with limb what you used to feel with the bottom of your foot.   Side to Side Shift: Moving your hips only (not shoulders): move weight onto your left leg, HOLD/FEEL.  Move back to equal weight on each leg, HOLD/FEEL. Move weight onto your right leg, HOLD/FEEL. Move back to equal weight on each leg, HOLD/FEEL. Repeat.  Start with both hands on sink, progress to right hand only, then no hands.  Front to Back Shift: Moving your hips only (not shoulders): move your weight forward onto your toes, HOLD/FEEL. Move your weight back to equal Flat Foot on both legs, HOLD/FEEL. Move your weight back onto your heels,  HOLD/FEEL. Move your weight back to equal on both legs, HOLD/FEEL. Repeat.   tart with both hands on sink, progress to right hand only, then no hands. Moving Cones / Cups: With equal weight on each leg: Hold on with one hand the first time, then progress to no hand supports. Move cups from one side of sink to the other. Place cups ~2" out of your reach, progress to 10" beyond reach.  Place one hand in middle of sink and reach with other hand. Do both arms.  Then hover one hand and move cups with other hand. -just use a crossbody reach to end of safe weight shift ROM Overhead/Upward Reaching: alternated reaching up to top cabinets or ceiling if no cabinets present. Keep equal weight on each leg. Start with one hand support on counter while other hand reaches and progress to no hand support with reaching.  ace one hand in middle of sink and reach with other hand. Do both arms.  Then hover one hand and move cups with other hand.  5.   Looking Over Shoulders: With equal weight on each leg: alternate turning to look over your shoulders with one hand support on counter as needed.  Start with head motions only to look in front of shoulder, then even with shoulder and progress to looking behind you. To look to side, move head /eyes, then shoulder on side looking pulls back, shift more weight to side looking and pull hip back. ace one hand in middle of sink and let go with other hand so your shoulder can pull back. Switch hands to look other way.   Then hover one hand and move cups with other hand.  6.  Stepping with leg that is not amputated:  Move items under cabinet out of your way. Shift your hips/pelvis so weight on prosthesis. SLOWLY step other leg so front of foot is in cabinet. Then step back to floor. - just hike the hip and place the foot back in the starting spot  ASSESSMENT:  CLINICAL IMPRESSION: Focus of skilled  PT session today on gait training with SPC.  He is able to perform 6 curb step and ramp at  CGA level w/ modifications to BOS.  Stairs were fatiguing and he would benefit from further practice to decrease support so he is better able to manage community obstacles.  His general mobility with LRAD is greatly improving already and he stands to benefit from future prosthetic modifications based on ongoing improvements in performance with current componentry.  Continue per POC.  OBJECTIVE IMPAIRMENTS: Abnormal gait, decreased activity tolerance, decreased balance, decreased endurance, decreased knowledge of use of DME, decreased mobility, difficulty walking, decreased strength, decreased safety awareness, impaired vision/preception, improper body mechanics, postural dysfunction, and prosthetic dependency .   ACTIVITY LIMITATIONS: carrying, lifting, bending, standing, squatting, stairs, transfers, and locomotion level  PARTICIPATION LIMITATIONS: community activity, occupation, and yard work  PERSONAL FACTORS: Past/current experiences, Time since onset of injury/illness/exacerbation, and 3+ comorbidities: lacunar infarction, HTN, diabetic retinopathy are also affecting patient's functional outcome.   REHAB POTENTIAL: Good  CLINICAL DECISION MAKING: Evolving/moderate complexity  EVALUATION COMPLEXITY: Moderate   GOALS: Goals reviewed with patient? Yes  SHORT TERM GOALS: Target date: 06/06/2024  Pt will be independent and compliant with introductory prosthetic management, strengthening and standing balance HEP in order to maintain functional progress and improve mobility. Baseline:  Ind and compliant per report 7/1 Goal status: MET  2.  Pt will be independent in sock ply adjustment in order to improve wear tolerance and prosthetic fit. Baseline: pt still unsure of ply he wears at baseline due to wear of socks, has not had much need for adjustment but understands he should add if socket is loose (7/1) Goal status: PARTIALLY MET  3.  Pt will decrease 5xSTS to </=12 seconds w/ no more than  single UE support and minimum momentum to initiate transfer in order to demonstrate decreased risk for falls and improved functional bilateral LE strength and power. Baseline:  12.75 sec w/ BUE and moderate use of momentum to initiate stand (6/3); 11.47 sec w/ BUE, most momentum use on initial 2 stands (7/1) Goal status: PARTIALLY MET  4.  Pt will demonstrate a gait speed of >/=1.54 feet/sec in order to decrease risk for falls. Baseline: 1.34 ft/sec w/ 2WW SBA (6/3); 1.25 ft/sec w/ 2WW mod I (7/1) Goal status: NOT MET  5.  Pt will improve AMPPRO to >/=32/47 in order to demonstrate improved functional capacity on current prosthetic componentry. Baseline: 27/47 (6/3); 30/47 (7/1) Goal status: IN PROGRESS  LONG TERM GOALS: Target date: 07/04/2024  Pt will be independent and compliant with advanced prosthetic management, strengthening and standing balance HEP in order to maintain functional progress and improve mobility. Baseline: Reports ind and compliance (7/29) Goal status: MET  2.   5xSTS to be assessed w/ LTG set as appropriate. Baseline: Assessed 6/3 - no LTG needed at this time. Goal status: REVISED - D/C'd  3.  Pt will demonstrate a gait speed of >/=1.74 feet/sec in order to decrease risk for falls. Baseline: 1.34 ft/sec w/ 2WW SBA (6/3); 1.38 ft/sec (7/29) Goal status: NOT MET  4.  Pt will improve AMPPRO to >/=37/47 in order to demonstrate improved functional capacity on current prosthetic componentry. Baseline: 27/47 (6/3); 38/47 (7/29) Goal status: MET  5.  Pt will navigate 4 stairs using step-to pattern and bilateral rails at mod I level to demonstrate safety in home environment. Baseline: Pt reports ind at home; mod I x4 stairs step to (7/29) Goal status:  MET  6.  Pt will initiate ambulation in clinic >/=50 ft with least restrictive cane option in order to demonstrate improved upright stability and prosthetic management. Baseline: Pt attempts a few feet with cane in home  only; [facility] x115' at [facility] (7/29) Goal status: MET  GOALS (at 2/70 re-cert): Goals reviewed with patient? Yes  SHORT TERM GOALS: Target date: 08/08/2024  1.  Pt will be independent in sock ply adjustment in order to improve wear tolerance and prosthetic fit. Baseline: pt waiting to obtain new socks (7/29) Goal status: INITIAL  2.  Pt will demonstrate a gait speed of >/=1.54 feet/sec in order to decrease risk for falls. Baseline: 1.38 ft/sec (7/29) Goal status: INITIAL  3.  Pt will perform curb step using least restrictive cane option at no more than SBA level in order to improve community obstacle management. Baseline: CGA-minA Goal status: INITIAL  LONG TERM GOALS: Target date: 09/05/2024  1.  Pt will demonstrate a gait speed of >/=1.74 feet/sec in order to decrease risk for falls. Baseline: 1.38 ft/sec (7/29) Goal status:  INITIAL  2.  Pt will ambulate up and down ramp w/ SPC at no more than SBA in order to demonstrate improved community management. Baseline: CGA-minA Goal status: INITIAL  3.  Pt will navigate 4 stairs using step-to pattern unilateral rail and cane as needed at mod I level to demonstrate safety in home and community environments. Baseline: mod I x4 stairs step to bil rails (7/29) Goal status:  INITIAL  4.  Pt will ambulate >/=500 feet over level and unlevel ground with least restrictive cane option and no more than SBA level of assist to promote household and community access. Baseline: SPC x115' at [facility] level surface only (7/29) Goal status: INITIAL  PLAN:  PT FREQUENCY: 2x/week + 1x/wk  PT DURATION: 8 weeks + 8 weeks  PLANNED INTERVENTIONS: [id number]- PT Re-evaluation, 97750- Physical Performance Testing, 97110-Therapeutic exercises, 97530- Therapeutic activity, [id number]- Neuromuscular re-education, 97535- Self Care, [id number]- Manual therapy, [id number]- Gait training, [phone]- Prosthetic Initial , [phone]- Electrical stimulation (manual), Patient/Family education,  Balance training, Stair training, Vestibular training, and DME instructions  PLAN FOR NEXT SESSION:  work towards cane, progress HEP.  Sock ply adjustment - did he get socks?  Determine benefit from loftstrands vs 2WW - AD order (holding on standard walker order request)?  SPC cane curb/ramp/stairs - continue per pt request, esp stairs!  Outside if weather nice!  Daved KATHEE Bull, PT, DPT  07/16/2024, 5:01 PM

## 2024-07-22 ENCOUNTER — Encounter: Payer: Self-pay | Admitting: Physical Therapy

## 2024-07-22 ENCOUNTER — Ambulatory Visit: Admitting: Physical Therapy

## 2024-07-22 VITALS — BP 142/73 | HR 79

## 2024-07-22 DIAGNOSIS — M6281 Muscle weakness (generalized): Secondary | ICD-10-CM

## 2024-07-22 DIAGNOSIS — R2689 Other abnormalities of gait and mobility: Secondary | ICD-10-CM | POA: Diagnosis not present

## 2024-07-22 DIAGNOSIS — R2681 Unsteadiness on feet: Secondary | ICD-10-CM

## 2024-07-22 DIAGNOSIS — R269 Unspecified abnormalities of gait and mobility: Secondary | ICD-10-CM

## 2024-07-22 NOTE — Therapy (Signed)
 OUTPATIENT PHYSICAL THERAPY PROSTHETICS TREATMENT   Patient Name: Jeremy Padilla MRN: [id number] DOB:1966/01/15, 58 y.o., male Today's Date: 07/22/2024  PCP: Carin Gauze, NP REFERRING PROVIDER: Carin Gauze, NP  END OF SESSION:  PT End of Session - 07/22/24 1531     Visit Number 15    Number of Visits 25   17 + 8   Date for PT Re-Evaluation 09/19/24   pushed out due to scheduling delay   Authorization Type Medicare A & B with secondary Medicaid    Progress Note Due on Visit 20    PT Start Time 1525    PT Stop Time 1614    PT Time Calculation (min) 49 min    Equipment Utilized During Treatment Gait belt    Activity Tolerance Patient tolerated treatment well    Behavior During Therapy WFL for tasks assessed/performed           Past Medical History:  Diagnosis Date   Abscess 10/05/2015   Diabetes mellitus without complication (HCC)    Heart murmur    Herpes exposure    Hyperlipidemia    Hypertension    Past Surgical History:  Procedure Laterality Date   AMPUTATION Left 06/23/2020   Procedure: AMPUTATION ABOVE KNEE;  Surgeon: Marea Selinda RAMAN, MD;  Location: [facility];  Service: General;  Laterality: Left;   LOWER EXTREMITY ANGIOGRAPHY Left 04/09/2019   Procedure: LOWER EXTREMITY ANGIOGRAPHY;  Surgeon: Marea Selinda RAMAN, MD;  Location: [facility];  Service: Cardiovascular;  Laterality: Left;   LOWER EXTREMITY ANGIOGRAPHY Left 04/19/2020   Procedure: Lower Extremity Angiography;  Surgeon: Marea Selinda RAMAN, MD;  Location: [facility];  Service: Cardiovascular;  Laterality: Left;   LOWER EXTREMITY ANGIOGRAPHY Left 04/20/2020   Procedure: Lower Extremity Angiography;  Surgeon: Marea Selinda RAMAN, MD;  Location: [facility];  Service: Cardiovascular;  Laterality: Left;   LOWER EXTREMITY INTERVENTION N/A 05/30/2019   Procedure: LOWER EXTREMITY INTERVENTION;  Surgeon: Jama Cordella MATSU, MD;  Location: [facility];  Service: Cardiovascular;   Laterality: N/A;   PENILE PROSTHESIS IMPLANT N/A 02/05/2024   Procedure: INSERTION OF INFLATABLE PENILE PROSTHESIS;  Surgeon: Lovie Arlyss CROME, MD;  Location: [facility];  Service: Urology;  Laterality: N/A;  135 MINUTES NEEDED FOR CASE   WRIST SURGERY Left    Patient Active Problem List   Diagnosis Date Noted   Skin abrasion 01/29/2024   Edema of toe 08/20/2023   Toe infection 08/20/2023   Diabetic polyneuropathy associated with diabetes mellitus due to underlying condition (HCC) 05/18/2023   Benign mole 03/20/2023   Mixed hyperlipidemia 03/20/2023   Hx of AKA (above knee amputation), left (HCC) 09/10/2020   Protein-calorie malnutrition, severe 06/23/2020   Pressure injury of skin 06/22/2020   BKA stump complication (HCC) 06/21/2020   Hyponatremia 06/21/2020   Dehydration 06/21/2020   Ischemic leg 04/20/2020   Ischemia 04/16/2020   Ischemia of left lower extremity 05/30/2019   PAD (peripheral artery disease) (HCC) 04/08/2019   Lacunar infarction (HCC) 08/03/2017   Bruit of left carotid artery 08/03/2017   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) 08/02/2017   Abscess or cellulitis, neck 10/08/2015   Hyperglycemia 10/08/2015   Hypertension goal BP (blood pressure) < 140/90 10/08/2015   Heart murmur 10/08/2015    ONSET DATE: July 2021 (initial amputation)  REFERRING DIAG: S10.387 (ICD-10-CM) - Hx of AKA (above knee amputation), left (HCC) I73.9 (ICD-10-CM) - PAD (peripheral artery disease) (HCC)  THERAPY  DIAG:  Other abnormalities of gait and mobility  Unsteadiness on feet  Muscle weakness (generalized)  Abnormality of gait and mobility  Rationale for Evaluation and Treatment: Rehabilitation  SUBJECTIVE:   SUBJECTIVE STATEMENT: Denies falls.  He has an appt with Garrel at [place] on 8/19 about his socks.  He continues to ambulate w/ 2WW using step to quick cadence. Pt accompanied by: self (drives himself)  PERTINENT HISTORY: HTN (goal  <140/90), DM2, lacunar infarction, PAD, heart murmur, diabetic retinopathy (treated with shots)  PAIN:  Are you having pain? 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  Pt fatigues quickly with this task due to prior activity finding this task most challenging today.  Practiced x1 outside w/ CGA as pt cautious because he cannot see the slope - cues to use contrast of grass against sidewalk with some improvement in approach on descent.  CURB:  Level of Assistance: SBA and CGA Assistive device utilized: Single point cane Curb Comments: Performed x3 w/ better approximation and carryover of sequencing from stair practice prior.  Performed x4 outside progressing to SBA with cuing to improve width of BOS so pt does not land in narrow stance.  STAIRS:  Level of Assistance: SBA and CGA  Stair Negotiation Technique: Step to Pattern Forwards With use of AD: SPC with No Rails  Number of Stairs: 3x4   Height of Stairs: 6  Comments: Cues for sequencing, cane placement for improved stability on descent, facilitation of R hip forward in stance especially with descent.  Less fatigue today and improved stride stability on descent. SBA on ascent w/ intermittent CGA on descent.  GAIT: Gait pattern: step to pattern, step through pattern, decreased arm swing- Left, decreased step length- Right, decreased stance time- Left, decreased stride length,  and narrow BOS Distance walked: 200 ft + 200 ft + 30 ft x2 (outside over [employer]) Assistive device utilized: Single point cane Level of assistance: CGA Comments: Cues to continue working on stride length and step through.  His posture is better this visit.  Some IR of prosthetic w/ increased distance, but likely related to limb volume fluctuation or perspiration with prolonged activity - pt seeing [facility] 8/19.  PATIENT EDUCATION: Education details: continue HEP - continue STS focus and can add 4-way stepping at counter and STS w/ stride step w/ hand support -  can also add hip hike and IR/ER w/ hip flexion/extension in SLS at bar as well, monitor BP; ask [facility] about prosthetic component upgrades.  Practicing w/ SPC at home - focus on walking level surfaces for now.  Energy conservation and breathing for eccentric push and fatigue management. Person educated: Patient Education method: Explanation, Demonstration, and Handouts Education comprehension: verbalized understanding and needs further education  HOME EXERCISE PROGRAM: Access Code: [email or web address] URL: [email or web address] Date: 05/09/2024 Prepared by: Daved Bull  Exercises - Sidelying Hip Abduction wtih Flexion and Extension (BKA)  - 1 x daily - 7 x weekly - 3 sets - 10 reps - Sidelying Hip Abduction (AKA)  - 1 x daily - 7 x weekly - 3 sets - 10 reps - Sidelying Hip Circles (AKA)  - 1 x daily - 7 x weekly - 3 sets - 10 reps - Supine Psoas Stretch (AKA)  - 1 x daily - 7 x weekly - 3 sets - 10 reps  AT [facility] [place] FIND YOUR MIDLINE POSITION AND PLACE FEET EQUAL DISTANCE FROM THE MIDLINE.  Try to find this position when standing still for activities.    USE TAPE ON FLOOR TO MARK THE MIDLINE POSITION. You also should try to feel with your limb pressure in socket.  You are trying to feel with limb what you used to feel with the bottom of your foot.   Side to Side Shift: Moving your hips only (not shoulders): move  weight onto your left leg, HOLD/FEEL.  Move back to equal weight on each leg, HOLD/FEEL. Move weight onto your right leg, HOLD/FEEL. Move back to equal weight on each leg, HOLD/FEEL. Repeat.  Start with both hands on sink, progress to right hand only, then no hands.  Front to Back Shift: Moving your hips only (not shoulders): move your weight forward onto your toes, HOLD/FEEL. Move your weight back to equal Flat Foot on both legs, HOLD/FEEL. Move your weight back onto your heels, HOLD/FEEL. Move your weight back to equal on both legs, HOLD/FEEL. Repeat.   tart with both hands on sink, progress to right hand only, then no hands. Moving Cones / Cups: With equal weight on each leg: Hold on with one hand the first time, then progress to no hand supports. Move cups from one side of sink to the other. Place cups ~2" out of your reach, progress to 10" beyond reach.  Place one hand in middle of sink and reach with other hand. Do both arms.  Then hover one hand and move cups with other hand. -just use a crossbody reach to end of safe weight shift ROM Overhead/Upward Reaching: alternated reaching up to top cabinets or ceiling if no cabinets present. Keep equal weight on each leg. Start with one hand support on counter while other hand reaches and progress to no hand support with reaching.  ace one hand in middle of sink and reach with other hand. Do both arms.  Then hover one hand and move cups with other hand.  5.   Looking Over Shoulders: With equal weight on each leg: alternate turning to look over your shoulders with one hand support on counter as needed.  Start with head motions only to look in front of shoulder, then even with shoulder and progress to looking behind you. To look to side, move head /eyes, then shoulder on side looking pulls back, shift more weight to side looking and pull hip back. ace one hand in middle of sink and let go with  other hand so your shoulder can pull back. Switch hands to look other  way.   Then hover one hand and move cups with other hand.  6.  Stepping with leg that is not amputated:  Move items under cabinet out of your way. Shift your hips/pelvis so weight on prosthesis. SLOWLY step other leg so front of foot is in cabinet. Then step back to floor. - just hike the hip and place the foot back in the starting spot  ASSESSMENT:  CLINICAL IMPRESSION: Emphasis of skilled session on progression of gait with SPC to outdoor obstacle management.  His guarding requirements decreased to no more than CGA today for stairs, ramp, curb, and level and sidewalk ambulation.  He continues to have apprehension with ramp decline likely due to fixed ankle component and needed hip stability.  PT continues to address high level balance and functional strength to improve gait mechanics, but feel he may benefit from ankle componentry upgrades.  Pt to see prosthetist 8/19 for further assessment.  Continue per POC.  OBJECTIVE IMPAIRMENTS: Abnormal gait, decreased activity tolerance, decreased balance, decreased endurance, decreased knowledge of use of DME, decreased mobility, difficulty walking, decreased strength, decreased safety awareness, impaired vision/preception, improper body mechanics, postural dysfunction, and prosthetic dependency .   ACTIVITY LIMITATIONS: carrying, lifting, bending, standing, squatting, stairs, transfers, and locomotion level  PARTICIPATION LIMITATIONS: community activity, occupation, and yard work  PERSONAL FACTORS: Past/current experiences, Time since onset of injury/illness/exacerbation, and 3+ comorbidities: lacunar infarction, HTN, diabetic retinopathy are also affecting patient's functional outcome.   REHAB POTENTIAL: Good  CLINICAL DECISION MAKING: Evolving/moderate complexity  EVALUATION COMPLEXITY: Moderate   GOALS: Goals reviewed with patient? Yes  SHORT TERM GOALS: Target date: 06/06/2024  Pt will be independent and compliant with introductory  prosthetic management, strengthening and standing balance HEP in order to maintain functional progress and improve mobility. Baseline:  Ind and compliant per report 7/1 Goal status: MET  2.  Pt will be independent in sock ply adjustment in order to improve wear tolerance and prosthetic fit. Baseline: pt still unsure of ply he wears at baseline due to wear of socks, has not had much need for adjustment but understands he should add if socket is loose (7/1) Goal status: PARTIALLY MET  3.  Pt will decrease 5xSTS to </=12 seconds w/ no more than single UE support and minimum momentum to initiate transfer in order to demonstrate decreased risk for falls and improved functional bilateral LE strength and power. Baseline:  12.75 sec w/ BUE and moderate use of momentum to initiate stand (6/3); 11.47 sec w/ BUE, most momentum use on initial 2 stands (7/1) Goal status: PARTIALLY MET  4.  Pt will demonstrate a gait speed of >/=1.54 feet/sec in order to decrease risk for falls. Baseline: 1.34 ft/sec w/ 2WW SBA (6/3); 1.25 ft/sec w/ 2WW mod I (7/1) Goal status: NOT MET  5.  Pt will improve AMPPRO to >/=32/47 in order to demonstrate improved functional capacity on current prosthetic componentry. Baseline: 27/47 (6/3); 30/47 (7/1) Goal status: IN PROGRESS  LONG TERM GOALS: Target date: 07/04/2024  Pt will be independent and compliant with advanced prosthetic management, strengthening and standing balance HEP in order to maintain functional progress and improve mobility. Baseline: Reports ind and compliance (7/29) Goal status: MET  2.   5xSTS to be assessed w/ LTG set as appropriate. Baseline: Assessed 6/3 - no LTG needed at this time. Goal status: REVISED - D/C'd  3.  Pt will demonstrate a gait speed  of >/=1.74 feet/sec in order to decrease risk for falls. Baseline: 1.34 ft/sec w/ 2WW SBA (6/3); 1.38 ft/sec (7/29) Goal status: NOT MET  4.  Pt will improve AMPPRO to >/=37/47 in order to demonstrate  improved functional capacity on current prosthetic componentry. Baseline: 27/47 (6/3); 38/47 (7/29) Goal status: MET  5.  Pt will navigate 4 stairs using step-to pattern and bilateral rails at mod I level to demonstrate safety in home environment. Baseline: Pt reports ind at home; mod I x4 stairs step to (7/29) Goal status:  MET  6.  Pt will initiate ambulation in clinic >/=50 ft with least restrictive cane option in order to demonstrate improved upright stability and prosthetic management. Baseline: Pt attempts a few feet with cane in home only; [facility] x115' at [facility] (7/29) Goal status: MET  GOALS (at 2/70 re-cert): Goals reviewed with patient? Yes  SHORT TERM GOALS: Target date: 08/08/2024  1.  Pt will be independent in sock ply adjustment in order to improve wear tolerance and prosthetic fit. Baseline: pt waiting to obtain new socks (7/29) Goal status: INITIAL  2.  Pt will demonstrate a gait speed of >/=1.54 feet/sec in order to decrease risk for falls. Baseline: 1.38 ft/sec (7/29) Goal status: INITIAL  3.  Pt will perform curb step using least restrictive cane option at no more than SBA level in order to improve community obstacle management. Baseline: CGA-minA Goal status: INITIAL  LONG TERM GOALS: Target date: 09/05/2024  1.  Pt will demonstrate a gait speed of >/=1.74 feet/sec in order to decrease risk for falls. Baseline: 1.38 ft/sec (7/29) Goal status:  INITIAL  2.  Pt will ambulate up and down ramp w/ SPC at no more than SBA in order to demonstrate improved community management. Baseline: CGA-minA Goal status: INITIAL  3.  Pt will navigate 4 stairs using step-to pattern unilateral rail and cane as needed at mod I level to demonstrate safety in home and community environments. Baseline: mod I x4 stairs step to bil rails (7/29) Goal status:  INITIAL  4.  Pt will ambulate >/=500 feet over level and unlevel ground with least restrictive cane option and no more than SBA  level of assist to promote household and community access. Baseline: SPC x115' at [facility] level surface only (7/29) Goal status: INITIAL  PLAN:  PT FREQUENCY: 2x/week + 1x/wk  PT DURATION: 8 weeks + 8 weeks  PLANNED INTERVENTIONS: [id number]- PT Re-evaluation, 97750- Physical Performance Testing, 97110-Therapeutic exercises, 97530- Therapeutic activity, [id number]- Neuromuscular re-education, 97535- Self Care, [id number]- Manual therapy, [id number]- Gait training, [phone]- Prosthetic Initial , [phone]- Electrical stimulation (manual), Patient/Family education, Balance training, Stair training, Vestibular training, and DME instructions  PLAN FOR NEXT SESSION:  work towards cane, progress HEP.  Sock ply adjustment - did he get socks?  Determine benefit from loftstrands vs 2WW - AD order (holding on standard walker order request)?  SPC cane curb/ramp/stairs - continue per pt request, esp stairs!  Outside if weather nice!  Daved KATHEE Bull, PT, DPT  07/22/2024, 4:16 PM

## 2024-07-28 ENCOUNTER — Telehealth: Payer: Self-pay

## 2024-07-28 ENCOUNTER — Other Ambulatory Visit: Payer: Self-pay | Admitting: Cardiology

## 2024-07-28 MED ORDER — PREDNISONE 20 MG PO TABS
ORAL_TABLET | ORAL | 0 refills | Status: DC
Start: 2024-07-28 — End: 2024-09-17

## 2024-07-28 NOTE — Telephone Encounter (Signed)
 Patient LM stating that he's got poison oak on his face and arm again and wanting to know what you can send him in

## 2024-07-28 NOTE — Telephone Encounter (Signed)
 Pt states he is still itching on his face from the poison oak, wants to know if he needs to come into be seen or if you could just send him something in

## 2024-07-30 ENCOUNTER — Encounter: Payer: Self-pay | Admitting: Physical Therapy

## 2024-07-30 ENCOUNTER — Ambulatory Visit: Admitting: Physical Therapy

## 2024-07-30 VITALS — BP 156/83 | HR 77

## 2024-07-30 DIAGNOSIS — R2681 Unsteadiness on feet: Secondary | ICD-10-CM

## 2024-07-30 DIAGNOSIS — M6281 Muscle weakness (generalized): Secondary | ICD-10-CM

## 2024-07-30 DIAGNOSIS — R269 Unspecified abnormalities of gait and mobility: Secondary | ICD-10-CM

## 2024-07-30 DIAGNOSIS — R2689 Other abnormalities of gait and mobility: Secondary | ICD-10-CM | POA: Diagnosis not present

## 2024-07-30 NOTE — Therapy (Signed)
 OUTPATIENT PHYSICAL THERAPY PROSTHETICS TREATMENT   Patient Name: Jeremy Padilla MRN: [id number] DOB:[birth year], 58 y.o., male Today's Date: 07/30/2024  PCP: Carin Gauze, NP REFERRING PROVIDER: Carin Gauze, NP  END OF SESSION:  PT End of Session - 07/30/24 1546     Visit Number 16    Number of Visits 25   17 + 8   Date for PT Re-Evaluation 09/19/24   pushed out due to scheduling delay   Authorization Type Medicare A & B with secondary Medicaid    Progress Note Due on Visit 20    PT Start Time 1533    PT Stop Time 1627    PT Time Calculation (min) 54 min    Equipment Utilized During Treatment Gait belt    Activity Tolerance Patient tolerated treatment well    Behavior During Therapy WFL for tasks assessed/performed           Past Medical History:  Diagnosis Date   Abscess 10/05/2015   Diabetes mellitus without complication (HCC)    Heart murmur    Herpes exposure    Hyperlipidemia    Hypertension    Past Surgical History:  Procedure Laterality Date   AMPUTATION Left 06/23/2020   Procedure: AMPUTATION ABOVE KNEE;  Surgeon: Marea Selinda RAMAN, MD;  Location: [facility];  Service: General;  Laterality: Left;   LOWER EXTREMITY ANGIOGRAPHY Left 04/09/2019   Procedure: LOWER EXTREMITY ANGIOGRAPHY;  Surgeon: Marea Selinda RAMAN, MD;  Location: [facility];  Service: Cardiovascular;  Laterality: Left;   LOWER EXTREMITY ANGIOGRAPHY Left 04/19/2020   Procedure: Lower Extremity Angiography;  Surgeon: Marea Selinda RAMAN, MD;  Location: [facility];  Service: Cardiovascular;  Laterality: Left;   LOWER EXTREMITY ANGIOGRAPHY Left 04/20/2020   Procedure: Lower Extremity Angiography;  Surgeon: Marea Selinda RAMAN, MD;  Location: [facility];  Service: Cardiovascular;  Laterality: Left;   LOWER EXTREMITY INTERVENTION N/A 05/30/2019   Procedure: LOWER EXTREMITY INTERVENTION;  Surgeon: Jama Cordella MATSU, MD;  Location: [facility];  Service: Cardiovascular;   Laterality: N/A;   PENILE PROSTHESIS IMPLANT N/A 02/05/2024   Procedure: INSERTION OF INFLATABLE PENILE PROSTHESIS;  Surgeon: Lovie Arlyss CROME, MD;  Location: [facility];  Service: Urology;  Laterality: N/A;  135 MINUTES NEEDED FOR CASE   WRIST SURGERY Left    Patient Active Problem List   Diagnosis Date Noted   Skin abrasion 01/29/2024   Edema of toe 08/20/2023   Toe infection 08/20/2023   Diabetic polyneuropathy associated with diabetes mellitus due to underlying condition (HCC) 05/18/2023   Benign mole 03/20/2023   Mixed hyperlipidemia 03/20/2023   Hx of AKA (above knee amputation), left (HCC) 09/10/2020   Protein-calorie malnutrition, severe 06/23/2020   Pressure injury of skin 06/22/2020   BKA stump complication (HCC) 06/21/2020   Hyponatremia 06/21/2020   Dehydration 06/21/2020   Ischemic leg 04/20/2020   Ischemia 04/16/2020   Ischemia of left lower extremity 05/30/2019   PAD (peripheral artery disease) (HCC) 04/08/2019   Lacunar infarction (HCC) 08/03/2017   Bruit of left carotid artery 08/03/2017   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) 08/02/2017   Abscess or cellulitis, neck 10/08/2015   Hyperglycemia 10/08/2015   Hypertension goal BP (blood pressure) < 140/90 10/08/2015   Heart murmur 10/08/2015    ONSET DATE: July 2021 (initial amputation)  REFERRING DIAG: S10.387 (ICD-10-CM) - Hx of AKA (above knee amputation), left (HCC) I73.9 (ICD-10-CM) - PAD (peripheral artery disease) (HCC)  THERAPY  DIAG:  Other abnormalities of gait and mobility  Unsteadiness on feet  Muscle weakness (generalized)  Abnormality of gait and mobility  Rationale for Evaluation and Treatment: Rehabilitation  SUBJECTIVE:   SUBJECTIVE STATEMENT: Denies falls.  He has had recurrence of poison oak on BUE and is back on steroids.  He reports being excited today as his prosthetic is being upgraded.  He is to see surgeon on 8/29 for assessment and order for  new prosthesis.  Prosthetist, Garrel, recommending lighter weight components and suction socket.  He reports his new prosthetic will be ready around Christmas. Pt accompanied by: self (drives himself)  PERTINENT HISTORY: HTN (goal <140/90), DM2, lacunar infarction, PAD, heart murmur, diabetic retinopathy (treated with shots)  PAIN:  Are you having pain? 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   PATIENT GOALS: to walk better and maybe get a better prosthetic, to be more independent                                                                                                                            TREATMENT LUE in sitting prior to interventions: Today's Vitals   07/30/24 1544  BP: (!) 156/83  Pulse: 77   STAIRS:  Level of Assistance: SBA and CGA  Stair Negotiation Technique: Step to Pattern Forwards With use of AD: SPC with No Rails  Number of Stairs: 3x4   Height of Stairs: 6  Comments: Time spent facilitating forward weight shift onto prosthetic during descent to limit posterior bias.  GAIT: Gait pattern: step to pattern, step through pattern, decreased arm swing- Left, decreased step length- Right, decreased stance time- Left, decreased stride length, and narrow BOS Distance walked: 300 ft (outside over unlevel sidewalk) + 16 ft w/ single turn (on grass) Assistive device utilized: Single point cane Level of assistance: SBA and CGA Comments:  Posture is markedly improved today.  No IR or wiggling of prosthetic while outside - edu on wiping residual limb if noticing perspiring during walking to prevent losing prosthetic/falls.  He manages slight inclines and declines well but is very challenged on grass due to left hip flexor fatigue.  More instability on grass.  PATIENT EDUCATION: Education details: continue HEP - continue STS focus and can add 4-way stepping at counter and STS w/ stride step w/ hand support - can also add hip  hike and IR/ER w/ hip flexion/extension in SLS at bar as well.  Practicing w/ SPC at home - focus on walking level surfaces for now.  Monitor BP at home while on steroids.  Discussed POC - D/C at last visit as he is near functional max with current prosthetic componentry, return with new referral upon receipt of new prosthetic.  Use 2WW when in yard. Person educated: Patient Education method: Explanation Education comprehension: verbalized understanding and needs further education  HOME EXERCISE PROGRAM: Access Code: [email or web address] URL: [email or web address] Date: 05/09/2024 Prepared by: Breanna Mcdaniel  Exercises - Sidelying Hip Abduction wtih Flexion and Extension (BKA)  - 1 x daily - 7 x weekly - 3 sets - 10 reps - Sidelying Hip Abduction (AKA)  - 1 x daily - 7 x weekly - 3 sets - 10 reps - Sidelying Hip Circles (AKA)  - 1 x daily - 7 x weekly - 3 sets - 10 reps - Supine Psoas Stretch (AKA)  - 1 x daily - 7 x weekly - 3 sets - 10 reps  AT [facility] FIND YOUR MIDLINE POSITION AND PLACE FEET EQUAL DISTANCE FROM THE MIDLINE.  Try to find this position when standing still for activities.    USE TAPE ON FLOOR TO MARK THE MIDLINE POSITION. You also should try to feel with your limb pressure in socket.  You are trying to feel with limb what you used to feel with the bottom of your foot.   Side to Side Shift: Moving your hips only (not shoulders): move weight onto your left leg, HOLD/FEEL.  Move back to equal weight on each leg, HOLD/FEEL. Move weight onto your right leg, HOLD/FEEL. Move back to equal weight on each leg, HOLD/FEEL. Repeat.  Start with both hands on sink, progress to right hand only, then no hands.  Front to Back Shift: Moving your hips only (not shoulders): move your weight forward onto your toes, HOLD/FEEL. Move your weight back to equal Flat Foot on both legs, HOLD/FEEL. Move your weight back onto your heels, HOLD/FEEL. Move your weight back to equal on both legs, HOLD/FEEL.  Repeat.   tart with both hands on sink, progress to right hand only, then no hands. Moving Cones / Cups: With equal weight on each leg: Hold on with one hand the first time, then progress to no hand supports. Move cups from one side of sink to the other. Place cups ~2" out of your reach, progress to 10" beyond reach.  Place one hand in middle of sink and reach with other hand. Do both arms.  Then hover one hand and move cups with other hand. -just use a crossbody reach to end of safe weight shift ROM Overhead/Upward Reaching: alternated reaching up to top cabinets or ceiling if no cabinets present. Keep equal weight on each leg. Start with one hand support on counter while other hand reaches and progress to no hand support with reaching.  ace one hand in middle of sink and reach with other hand. Do both arms.  Then hover one hand and move cups with other hand.  5.   Looking Over Shoulders: With equal weight on each leg: alternate turning to look over your shoulders with one hand support on counter as needed.  Start with head motions only to look in front of shoulder, then even with shoulder and progress to looking behind you. To look to side, move head /eyes, then shoulder on side looking pulls back, shift more weight to side looking and pull hip back. ace one hand in middle of sink and let go with other hand so your shoulder can pull back. Switch hands to look other way.   Then hover one hand and move cups with other hand.  6.  Stepping with leg that is not amputated:  Move items under cabinet out of your way. Shift your hips/pelvis so weight on prosthesis. SLOWLY step other leg so front of foot is in cabinet. Then step back to floor. - just hike the hip and place the foot back in the starting spot  ASSESSMENT:  CLINICAL IMPRESSION: Pt scheduled to have prosthetic componentry upgraded and is almost at functional maximum with current prosthesis.  PT to discharge at last scheduled visit, but to continue  working towards ambulatory independence with community management using SPC option.  He is most challenged by stair and unlevel surface management based on recent performance.  Continue per POC.  OBJECTIVE IMPAIRMENTS: Abnormal gait, decreased activity tolerance, decreased balance, decreased endurance, decreased knowledge of use of DME, decreased mobility, difficulty walking, decreased strength, decreased safety awareness, impaired vision/preception, improper body mechanics, postural dysfunction, and prosthetic dependency .   ACTIVITY LIMITATIONS: carrying, lifting, bending, standing, squatting, stairs, transfers, and locomotion level  PARTICIPATION LIMITATIONS: community activity, occupation, and yard work  PERSONAL FACTORS: Past/current experiences, Time since onset of injury/illness/exacerbation, and 3+ comorbidities: lacunar infarction, HTN, diabetic retinopathy are also affecting patient's functional outcome.   REHAB POTENTIAL: Good  CLINICAL DECISION MAKING: Evolving/moderate complexity  EVALUATION COMPLEXITY: Moderate   GOALS: Goals reviewed with patient? Yes  SHORT TERM GOALS: Target date: 06/06/2024  Pt will be independent and compliant with introductory prosthetic management, strengthening and standing balance HEP in order to maintain functional progress and improve mobility. Baseline:  Ind and compliant per report 7/1 Goal status: MET  2.  Pt will be independent in sock ply adjustment in order to improve wear tolerance and prosthetic fit. Baseline: pt still unsure of ply he wears at baseline due to wear of socks, has not had much need for adjustment but understands he should add if socket is loose (7/1) Goal status: PARTIALLY MET  3.  Pt will decrease 5xSTS to </=12 seconds w/ no more than single UE support and minimum momentum to initiate transfer in order to demonstrate decreased risk for falls and improved functional bilateral LE strength and power. Baseline:  12.75 sec w/  BUE and moderate use of momentum to initiate stand (6/3); 11.47 sec w/ BUE, most momentum use on initial 2 stands (7/1) Goal status: PARTIALLY MET  4.  Pt will demonstrate a gait speed of >/=1.54 feet/sec in order to decrease risk for falls. Baseline: 1.34 ft/sec w/ 2WW SBA (6/3); 1.25 ft/sec w/ 2WW mod I (7/1) Goal status: NOT MET  5.  Pt will improve AMPPRO to >/=32/47 in order to demonstrate improved functional capacity on current prosthetic componentry. Baseline: 27/47 (6/3); 30/47 (7/1) Goal status: IN PROGRESS  LONG TERM GOALS: Target date: 07/04/2024  Pt will be independent and compliant with advanced prosthetic management, strengthening and standing balance HEP in order to maintain functional progress and improve mobility. Baseline: Reports ind and compliance (7/29) Goal status: MET  2.   5xSTS to be assessed w/ LTG set as appropriate. Baseline: Assessed 6/3 - no LTG needed at this time. Goal status: REVISED - D/C'd  3.  Pt will demonstrate a gait speed of >/=1.74 feet/sec in order to decrease risk for falls. Baseline: 1.34 ft/sec w/ 2WW SBA (6/3); 1.38 ft/sec (7/29) Goal status: NOT MET  4.  Pt will improve AMPPRO to >/=37/47 in order to demonstrate improved functional capacity on current prosthetic componentry. Baseline: 27/47 (6/3); 38/47 (7/29) Goal status: MET  5.  Pt will navigate 4 stairs using step-to pattern and bilateral rails at mod I level to demonstrate safety in home environment. Baseline: Pt reports ind at home; mod I x4 stairs step to (7/29) Goal status:  MET  6.  Pt will initiate ambulation in clinic >/=50 ft with least restrictive cane option in order to demonstrate improved upright stability and  prosthetic management. Baseline: Pt attempts a few feet with cane in home only; [facility] x115' at [facility] (7/29) Goal status: MET  GOALS (at 2/70 re-cert): Goals reviewed with patient? Yes  SHORT TERM GOALS: Target date: 08/08/2024  1.  Pt will be independent in  sock ply adjustment in order to improve wear tolerance and prosthetic fit. Baseline: pt waiting to obtain new socks (7/29) Goal status: INITIAL  2.  Pt will demonstrate a gait speed of >/=1.54 feet/sec in order to decrease risk for falls. Baseline: 1.38 ft/sec (7/29) Goal status: INITIAL  3.  Pt will perform curb step using least restrictive cane option at no more than SBA level in order to improve community obstacle management. Baseline: CGA-minA Goal status: INITIAL  LONG TERM GOALS: Target date: 09/05/2024  1.  Pt will demonstrate a gait speed of >/=1.74 feet/sec in order to decrease risk for falls. Baseline: 1.38 ft/sec (7/29) Goal status:  INITIAL  2.  Pt will ambulate up and down ramp w/ SPC at no more than SBA in order to demonstrate improved community management. Baseline: CGA-minA Goal status: INITIAL  3.  Pt will navigate 4 stairs using step-to pattern unilateral rail and cane as needed at mod I level to demonstrate safety in home and community environments. Baseline: mod I x4 stairs step to bil rails (7/29) Goal status:  INITIAL  4.  Pt will ambulate >/=500 feet over level and unlevel ground with least restrictive cane option and no more than SBA level of assist to promote household and community access. Baseline: SPC x115' at [facility] level surface only (7/29) Goal status: INITIAL  PLAN:  PT FREQUENCY: 2x/week + 1x/wk  PT DURATION: 8 weeks + 8 weeks  PLANNED INTERVENTIONS: [id number]- PT Re-evaluation, 97750- Physical Performance Testing, 97110-Therapeutic exercises, 97530- Therapeutic activity, [id number]- Neuromuscular re-education, 97535- Self Care, [id number]- Manual therapy, [id number]- Gait training, [id number]- Prosthetic Initial , [phone]- Electrical stimulation (manual), Patient/Family education, Balance training, Stair training, Vestibular training, and DME instructions  PLAN FOR NEXT SESSION:  SPC cane curb/ramp/stairs - continue per pt request, esp stairs!  Outside if weather nice!   Hurdles/turns/compliant surfaces  Daved KATHEE Bull, PT, DPT  07/30/2024, 5:04 PM

## 2024-08-06 ENCOUNTER — Encounter: Payer: Self-pay | Admitting: Physical Therapy

## 2024-08-06 ENCOUNTER — Ambulatory Visit: Admitting: Physical Therapy

## 2024-08-06 VITALS — BP 142/64 | HR 87

## 2024-08-06 DIAGNOSIS — R2689 Other abnormalities of gait and mobility: Secondary | ICD-10-CM

## 2024-08-06 DIAGNOSIS — R2681 Unsteadiness on feet: Secondary | ICD-10-CM

## 2024-08-06 DIAGNOSIS — R269 Unspecified abnormalities of gait and mobility: Secondary | ICD-10-CM

## 2024-08-06 DIAGNOSIS — M6281 Muscle weakness (generalized): Secondary | ICD-10-CM

## 2024-08-06 NOTE — Therapy (Unsigned)
 OUTPATIENT PHYSICAL THERAPY PROSTHETICS TREATMENT   Patient Name: Jeremy Padilla MRN: [id number] DOB:[birth year], 58 y.o., male Today's Date: 08/06/2024  PCP: Carin Gauze, NP REFERRING PROVIDER: Carin Gauze, NP  END OF SESSION:  PT End of Session - 08/06/24 1536     Visit Number 17    Number of Visits 25   17 + 8   Date for PT Re-Evaluation 09/19/24   pushed out due to scheduling delay   Authorization Type Medicare A & B with secondary Medicaid    Progress Note Due on Visit 20    PT Start Time 1533    PT Stop Time 1614    PT Time Calculation (min) 41 min    Equipment Utilized During Treatment Gait belt    Activity Tolerance Patient tolerated treatment well    Behavior During Therapy WFL for tasks assessed/performed           Past Medical History:  Diagnosis Date   Abscess 10/05/2015   Diabetes mellitus without complication (HCC)    Heart murmur    Herpes exposure    Hyperlipidemia    Hypertension    Past Surgical History:  Procedure Laterality Date   AMPUTATION Left 06/23/2020   Procedure: AMPUTATION ABOVE KNEE;  Surgeon: Marea Selinda RAMAN, MD;  Location: [facility];  Service: General;  Laterality: Left;   LOWER EXTREMITY ANGIOGRAPHY Left 04/09/2019   Procedure: LOWER EXTREMITY ANGIOGRAPHY;  Surgeon: Marea Selinda RAMAN, MD;  Location: [facility];  Service: Cardiovascular;  Laterality: Left;   LOWER EXTREMITY ANGIOGRAPHY Left 04/19/2020   Procedure: Lower Extremity Angiography;  Surgeon: Marea Selinda RAMAN, MD;  Location: [facility];  Service: Cardiovascular;  Laterality: Left;   LOWER EXTREMITY ANGIOGRAPHY Left 04/20/2020   Procedure: Lower Extremity Angiography;  Surgeon: Marea Selinda RAMAN, MD;  Location: [facility];  Service: Cardiovascular;  Laterality: Left;   LOWER EXTREMITY INTERVENTION N/A 05/30/2019   Procedure: LOWER EXTREMITY INTERVENTION;  Surgeon: Jama Cordella MATSU, MD;  Location: [facility];  Service: Cardiovascular;   Laterality: N/A;   PENILE PROSTHESIS IMPLANT N/A 02/05/2024   Procedure: INSERTION OF INFLATABLE PENILE PROSTHESIS;  Surgeon: Lovie Arlyss CROME, MD;  Location: [facility];  Service: Urology;  Laterality: N/A;  135 MINUTES NEEDED FOR CASE   WRIST SURGERY Left    Patient Active Problem List   Diagnosis Date Noted   Skin abrasion 01/29/2024   Edema of toe 08/20/2023   Toe infection 08/20/2023   Diabetic polyneuropathy associated with diabetes mellitus due to underlying condition (HCC) 05/18/2023   Benign mole 03/20/2023   Mixed hyperlipidemia 03/20/2023   Hx of AKA (above knee amputation), left (HCC) 09/10/2020   Protein-calorie malnutrition, severe 06/23/2020   Pressure injury of skin 06/22/2020   BKA stump complication (HCC) 06/21/2020   Hyponatremia 06/21/2020   Dehydration 06/21/2020   Ischemic leg 04/20/2020   Ischemia 04/16/2020   Ischemia of left lower extremity 05/30/2019   PAD (peripheral artery disease) (HCC) 04/08/2019   Lacunar infarction (HCC) 08/03/2017   Bruit of left carotid artery 08/03/2017   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) 08/02/2017   Abscess or cellulitis, neck 10/08/2015   Hyperglycemia 10/08/2015   Hypertension goal BP (blood pressure) < 140/90 10/08/2015   Heart murmur 10/08/2015    ONSET DATE: July 2021 (initial amputation)  REFERRING DIAG: S10.387 (ICD-10-CM) - Hx of AKA (above knee amputation), left (HCC) I73.9 (ICD-10-CM) - PAD (peripheral artery disease) (HCC)  THERAPY  DIAG:  Other abnormalities of gait and mobility  Unsteadiness on feet  Muscle weakness (generalized)  Abnormality of gait and mobility  Rationale for Evaluation and Treatment: Rehabilitation  SUBJECTIVE:   SUBJECTIVE STATEMENT: Denies falls.  He sees Careers adviser on 8/29 for new prosthetic order.  He has been moving furniture today prior to session (bedroom mattress and box spring) and is tired from lifting.  He got banged up with a small  cut on his right forearm and left hand and some small bruises along both hands and arms.  He has finished this round of steroids for poison oak rash and is feeling fine. Pt accompanied by: self (drives himself)  PERTINENT HISTORY: HTN (goal <140/90), DM2, lacunar infarction, PAD, heart murmur, diabetic retinopathy (treated with shots)  PAIN:  Are you having pain? 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   PATIENT GOALS: to walk better and maybe get a better prosthetic, to be more independent                                                                                                                            TREATMENT LUE in sitting prior to interventions: Today's Vitals   08/06/24 1539  BP: (!) 142/64  Pulse: 87   GAIT: Gait pattern: step to pattern, step through pattern, decreased arm swing- Left, decreased step length- Right, decreased stance time- Left, decreased stride length, and narrow BOS Distance walked: 500 ft (outside over unlevel sidewalk and grass)  Assistive device utilized: Single point cane Level of assistance: SBA and CGA Comments:  Pt is SBA until obstacle step over and grass management.  Recommending continued 2WW use outside in his yard due to fatigue noted and hesitancy with stepping in grass and afterwards.  His stride shortens and he has reduced LLE clearance as he fatigues.  Stability on inclines/declines is markedly improved and his tolerance w/ LRAD is as well.  6 step down w/ reverse up x15 on RLE progressing to [facility] support SBA  PATIENT EDUCATION: Education details: continue HEP - continue STS focus and can add 4-way stepping at counter and STS w/ stride step w/ hand support - can also add hip hike and IR/ER w/ hip flexion/extension in SLS at bar as well.  Practicing w/ SPC at home - focus on walking level surfaces for now.   Use 2WW when in yard. Person educated: Patient Education method:  Explanation Education comprehension: verbalized understanding and needs further education  HOME EXERCISE PROGRAM: Access Code: [email or web address] URL: [email or web address] Date: 05/09/2024 Prepared by: Daved Bull  Exercises - Sidelying Hip Abduction wtih Flexion and Extension (BKA)  - 1 x daily - 7 x weekly - 3 sets - 10 reps - Sidelying Hip Abduction (AKA)  - 1 x daily - 7 x weekly - 3 sets - 10 reps - Sidelying Hip Circles (AKA)  - 1 x daily - 7 x weekly - 3 sets -  10 reps - Supine Psoas Stretch (AKA)  - 1 x daily - 7 x weekly - 3 sets - 10 reps  AT [facility] FIND YOUR MIDLINE POSITION AND PLACE FEET EQUAL DISTANCE FROM THE MIDLINE.  Try to find this position when standing still for activities.    USE TAPE ON FLOOR TO MARK THE MIDLINE POSITION. You also should try to feel with your limb pressure in socket.  You are trying to feel with limb what you used to feel with the bottom of your foot.   Side to Side Shift: Moving your hips only (not shoulders): move weight onto your left leg, HOLD/FEEL.  Move back to equal weight on each leg, HOLD/FEEL. Move weight onto your right leg, HOLD/FEEL. Move back to equal weight on each leg, HOLD/FEEL. Repeat.  Start with both hands on sink, progress to right hand only, then no hands.  Front to Back Shift: Moving your hips only (not shoulders): move your weight forward onto your toes, HOLD/FEEL. Move your weight back to equal Flat Foot on both legs, HOLD/FEEL. Move your weight back onto your heels, HOLD/FEEL. Move your weight back to equal on both legs, HOLD/FEEL. Repeat.   tart with both hands on sink, progress to right hand only, then no hands. Moving Cones / Cups: With equal weight on each leg: Hold on with one hand the first time, then progress to no hand supports. Move cups from one side of sink to the other. Place cups ~2" out of your reach, progress to 10" beyond reach.  Place one hand in middle of sink and reach with other hand. Do both arms.   Then hover one hand and move cups with other hand. -just use a crossbody reach to end of safe weight shift ROM Overhead/Upward Reaching: alternated reaching up to top cabinets or ceiling if no cabinets present. Keep equal weight on each leg. Start with one hand support on counter while other hand reaches and progress to no hand support with reaching.  ace one hand in middle of sink and reach with other hand. Do both arms.  Then hover one hand and move cups with other hand.  5.   Looking Over Shoulders: With equal weight on each leg: alternate turning to look over your shoulders with one hand support on counter as needed.  Start with head motions only to look in front of shoulder, then even with shoulder and progress to looking behind you. To look to side, move head /eyes, then shoulder on side looking pulls back, shift more weight to side looking and pull hip back. ace one hand in middle of sink and let go with other hand so your shoulder can pull back. Switch hands to look other way.   Then hover one hand and move cups with other hand.  6.  Stepping with leg that is not amputated:  Move items under cabinet out of your way. Shift your hips/pelvis so weight on prosthesis. SLOWLY step other leg so front of foot is in cabinet. Then step back to floor. - just hike the hip and place the foot back in the starting spot  ASSESSMENT:  CLINICAL IMPRESSION: Focus of skilled session on continued progression of gait with SPC.  ***   Continue per POC.  OBJECTIVE IMPAIRMENTS: Abnormal gait, decreased activity tolerance, decreased balance, decreased endurance, decreased knowledge of use of DME, decreased mobility, difficulty walking, decreased strength, decreased safety awareness, impaired vision/preception, improper body mechanics, postural dysfunction, and prosthetic dependency .  ACTIVITY LIMITATIONS: carrying, lifting, bending, standing, squatting, stairs, transfers, and locomotion level  PARTICIPATION  LIMITATIONS: community activity, occupation, and yard work  PERSONAL FACTORS: Past/current experiences, Time since onset of injury/illness/exacerbation, and 3+ comorbidities: lacunar infarction, HTN, diabetic retinopathy are also affecting patient's functional outcome.   REHAB POTENTIAL: Good  CLINICAL DECISION MAKING: Evolving/moderate complexity  EVALUATION COMPLEXITY: Moderate   GOALS: Goals reviewed with patient? Yes  SHORT TERM GOALS: Target date: 06/06/2024  Pt will be independent and compliant with introductory prosthetic management, strengthening and standing balance HEP in order to maintain functional progress and improve mobility. Baseline:  Ind and compliant per report 7/1 Goal status: MET  2.  Pt will be independent in sock ply adjustment in order to improve wear tolerance and prosthetic fit. Baseline: pt still unsure of ply he wears at baseline due to wear of socks, has not had much need for adjustment but understands he should add if socket is loose (7/1) Goal status: PARTIALLY MET  3.  Pt will decrease 5xSTS to </=12 seconds w/ no more than single UE support and minimum momentum to initiate transfer in order to demonstrate decreased risk for falls and improved functional bilateral LE strength and power. Baseline:  12.75 sec w/ BUE and moderate use of momentum to initiate stand (6/3); 11.47 sec w/ BUE, most momentum use on initial 2 stands (7/1) Goal status: PARTIALLY MET  4.  Pt will demonstrate a gait speed of >/=1.54 feet/sec in order to decrease risk for falls. Baseline: 1.34 ft/sec w/ 2WW SBA (6/3); 1.25 ft/sec w/ 2WW mod I (7/1) Goal status: NOT MET  5.  Pt will improve AMPPRO to >/=32/47 in order to demonstrate improved functional capacity on current prosthetic componentry. Baseline: 27/47 (6/3); 30/47 (7/1) Goal status: IN PROGRESS  LONG TERM GOALS: Target date: 07/04/2024  Pt will be independent and compliant with advanced prosthetic management,  strengthening and standing balance HEP in order to maintain functional progress and improve mobility. Baseline: Reports ind and compliance (7/29) Goal status: MET  2.   5xSTS to be assessed w/ LTG set as appropriate. Baseline: Assessed 6/3 - no LTG needed at this time. Goal status: REVISED - D/C'd  3.  Pt will demonstrate a gait speed of >/=1.74 feet/sec in order to decrease risk for falls. Baseline: 1.34 ft/sec w/ 2WW SBA (6/3); 1.38 ft/sec (7/29) Goal status: NOT MET  4.  Pt will improve AMPPRO to >/=37/47 in order to demonstrate improved functional capacity on current prosthetic componentry. Baseline: 27/47 (6/3); 38/47 (7/29) Goal status: MET  5.  Pt will navigate 4 stairs using step-to pattern and bilateral rails at mod I level to demonstrate safety in home environment. Baseline: Pt reports ind at home; mod I x4 stairs step to (7/29) Goal status:  MET  6.  Pt will initiate ambulation in clinic >/=50 ft with least restrictive cane option in order to demonstrate improved upright stability and prosthetic management. Baseline: Pt attempts a few feet with cane in home only; [facility] x115' at [facility] (7/29) Goal status: MET  GOALS (at 2/70 re-cert): Goals reviewed with patient? Yes  SHORT TERM GOALS: Target date: 08/08/2024  1.  Pt will be independent in sock ply adjustment in order to improve wear tolerance and prosthetic fit. Baseline: pt waiting to obtain new socks (7/29) Goal status: INITIAL  2.  Pt will demonstrate a gait speed of >/=1.54 feet/sec in order to decrease risk for falls. Baseline: 1.38 ft/sec (7/29) Goal status: INITIAL  3.  Pt will  perform curb step using least restrictive cane option at no more than SBA level in order to improve community obstacle management. Baseline: CGA-minA Goal status: INITIAL  LONG TERM GOALS: Target date: 09/05/2024  1.  Pt will demonstrate a gait speed of >/=1.74 feet/sec in order to decrease risk for falls. Baseline: 1.38 ft/sec  (7/29) Goal status:  INITIAL  2.  Pt will ambulate up and down ramp w/ SPC at no more than SBA in order to demonstrate improved community management. Baseline: CGA-minA Goal status: INITIAL  3.  Pt will navigate 4 stairs using step-to pattern unilateral rail and cane as needed at mod I level to demonstrate safety in home and community environments. Baseline: mod I x4 stairs step to bil rails (7/29) Goal status:  INITIAL  4.  Pt will ambulate >/=500 feet over level and unlevel ground with least restrictive cane option and no more than SBA level of assist to promote household and community access. Baseline: SPC x115' at [facility] level surface only (7/29) Goal status: INITIAL  PLAN:  PT FREQUENCY: 2x/week + 1x/wk  PT DURATION: 8 weeks + 8 weeks  PLANNED INTERVENTIONS: [id number]- PT Re-evaluation, 97750- Physical Performance Testing, 97110-Therapeutic exercises, 97530- Therapeutic activity, [id number]- Neuromuscular re-education, 97535- Self Care, [id number]- Manual therapy, [id number]- Gait training, [phone]- Prosthetic Initial , [phone]- Electrical stimulation (manual), Patient/Family education, Balance training, Stair training, Vestibular training, and DME instructions  PLAN FOR NEXT SESSION:  SPC cane curb/ramp/stairs - continue per pt request, esp stairs - he wants to work on step downs and reverse up again!  Outside if weather nice!  Hurdles/turns/compliant surfaces  Daved KATHEE Bull, PT, DPT  08/06/2024, 4:42 PM

## 2024-08-08 ENCOUNTER — Ambulatory Visit (INDEPENDENT_AMBULATORY_CARE_PROVIDER_SITE_OTHER): Admitting: Nurse Practitioner

## 2024-08-08 ENCOUNTER — Encounter (INDEPENDENT_AMBULATORY_CARE_PROVIDER_SITE_OTHER): Payer: Self-pay | Admitting: Nurse Practitioner

## 2024-08-08 VITALS — BP 143/82 | HR 71 | Ht 74.0 in | Wt 146.2 lb

## 2024-08-08 DIAGNOSIS — I739 Peripheral vascular disease, unspecified: Secondary | ICD-10-CM

## 2024-08-08 DIAGNOSIS — Z89612 Acquired absence of left leg above knee: Secondary | ICD-10-CM | POA: Diagnosis not present

## 2024-08-11 NOTE — Progress Notes (Signed)
 Subjective:    Patient ID: Jeremy Padilla, male    DOB: Jan 12, 1966, 58 y.o.   MRN: [id number] Chief Complaint  Patient presents with   Establish Care    The patient presents today for evaluation of his left above-knee amputation site.  He notes that he is recently lost a significant amount of weight and his prosthetic leg no longer fits.  He denies any significant claudication or pain in his right lower extremity.  He denies rest pain or ulceration.    Review of Systems     Objective:   Physical Exam  BP (!) 143/82   Pulse 71   Ht 6' 2 (1.88 m)   Wt 146 lb 4 oz (66.3 kg)   BMI 18.78 kg/m   Past Medical History:  Diagnosis Date   Abscess 10/05/2015   Diabetes mellitus without complication (HCC)    Heart murmur    Herpes exposure    Hyperlipidemia    Hypertension     Social History   Socioeconomic History   Marital status: Widowed    Spouse name: Not on file   Number of children: Not on file   Years of education: Not on file   Highest education level: Not on file  Occupational History   Occupation: applied for disability  Tobacco Use   Smoking status: Never    Passive exposure: Never   Smokeless tobacco: Never  Vaping Use   Vaping status: Never Used  Substance and Sexual Activity   Alcohol use: Yes    Alcohol/week: 1.0 standard drink of alcohol    Types: 1 Glasses of wine per week    Comment: 1 occasionally   Drug use: No   Sexual activity: Yes  Other Topics Concern   Not on file  Social History Narrative   Wife just passed away Apr 13, 2009   Social Drivers of Health   Financial Resource Strain: Low Risk  (06/01/2020)   Received from [facility]   Overall Financial Resource Strain (CARDIA)    Difficulty of Paying Living Expenses: Not hard at all  Food Insecurity: No Food Insecurity (06/01/2020)   Received from [facility]   Hunger Vital Sign    Within the past 12 months, you worried that your food would run out before you got the money  to buy more.: Never true    Within the past 12 months, the food you bought just didn't last and you didn't have money to get more.: Never true  Transportation Needs: No Transportation Needs (06/01/2020)   Received from [facility]   PRAPARE - Transportation    Lack of Transportation (Medical): No    Lack of Transportation (Non-Medical): No  Physical Activity: Not on file  Stress: Not on file  Social Connections: Not on file  Intimate Partner Violence: Not on file    Past Surgical History:  Procedure Laterality Date   AMPUTATION Left 06/23/2020   Procedure: AMPUTATION ABOVE KNEE;  Surgeon: Marea Selinda RAMAN, MD;  Location: [facility];  Service: General;  Laterality: Left;   LOWER EXTREMITY ANGIOGRAPHY Left 04/09/2019   Procedure: LOWER EXTREMITY ANGIOGRAPHY;  Surgeon: Marea Selinda RAMAN, MD;  Location: [facility];  Service: Cardiovascular;  Laterality: Left;   LOWER EXTREMITY ANGIOGRAPHY Left 04/19/2020   Procedure: Lower Extremity Angiography;  Surgeon: Marea Selinda RAMAN, MD;  Location: [facility];  Service: Cardiovascular;  Laterality: Left;   LOWER EXTREMITY ANGIOGRAPHY Left 04/20/2020   Procedure: Lower Extremity Angiography;  Surgeon: Marea Selinda RAMAN, MD;  Location: [facility];  Service: Cardiovascular;  Laterality: Left;   LOWER EXTREMITY INTERVENTION N/A 05/30/2019   Procedure: LOWER EXTREMITY INTERVENTION;  Surgeon: Jama Cordella MATSU, MD;  Location: [facility];  Service: Cardiovascular;  Laterality: N/A;   PENILE PROSTHESIS IMPLANT N/A 02/05/2024   Procedure: INSERTION OF INFLATABLE PENILE PROSTHESIS;  Surgeon: Lovie Arlyss CROME, MD;  Location: [facility];  Service: Urology;  Laterality: N/A;  135 MINUTES NEEDED FOR CASE   WRIST SURGERY Left     Family History  Problem Relation Age of Onset   Heart disease Father    Hyperlipidemia Father    Hypertension Father     Allergies  Allergen Reactions   Penicillins Rash    .Has patient had a PCN reaction causing  immediate rash, facial/tongue/throat swelling, SOB or lightheadedness with hypotension: Unknown Has patient had a PCN reaction causing severe rash involving mucus membranes or skin necrosis: Unknown Has patient had a PCN reaction that required hospitalization: Unknown Has patient had a PCN reaction occurring within the last 10 years: Unknown If all of the above answers are NO, then may proceed with Cephalosporin use.  Patient Tolerates Cephalosporins    Statins Other (See Comments)       Latest Ref Rng & Units 07/09/2024    9:37 AM 01/25/2024   10:34 AM 07/17/2023   11:10 AM  CBC  WBC 3.4 - 10.8 x10E3/uL 5.5  5.3  6.3   Hemoglobin 13.0 - 17.7 g/dL 84.9  84.5  84.3   Hematocrit 37.5 - 51.0 % 46.5  45.6  46.2   Platelets 150 - 450 x10E3/uL 211  197  208       CMP     Component Value Date/Time   NA 137 07/09/2024 0937   K 4.2 07/09/2024 0937   CL 100 07/09/2024 0937   CO2 22 07/09/2024 0937   GLUCOSE 126 (H) 07/09/2024 0937   GLUCOSE 135 (H) 01/25/2024 1034   BUN 32 (H) 07/09/2024 0937   CREATININE 1.09 07/09/2024 0937   CALCIUM  9.7 07/09/2024 0937   PROT 6.4 07/09/2024 0937   ALBUMIN 4.5 07/09/2024 0937   AST 19 07/09/2024 0937   ALT 16 07/09/2024 0937   ALKPHOS 117 07/09/2024 0937   BILITOT 0.6 07/09/2024 0937   EGFR 78 07/09/2024 0937   GFRNONAA >60 01/25/2024 1034     No results found.     Assessment & Plan:   1. Hx of AKA (above knee amputation), left (HCC) (Primary) The patient currently new prosthesis because Current prosthesis is no longer  fitting properly because of the anatomical changes of his residual limb.  Will also order hydraulic knee and energy storing  foot.  I feel that this patient has potential to return to  a K3 level user with more physical therapy.  2. PAD (peripheral artery disease) (HCC) It has been several years since we have checked ABIs.  The patient return in 6 months we will reevaluate his perfusion.  He was advised to follow-up  sooner if he begins to experience symptoms.   Current Outpatient Medications on File Prior to Visit  Medication Sig Dispense Refill   aspirin  EC 81 MG tablet Take 81 mg by mouth daily.     cetirizine  (ZYRTEC  ALLERGY) 10 MG tablet Take 1 tablet (10 mg total) by mouth daily. 30 tablet 2   Cholecalciferol (VITAMIN D-3) 125 MCG (5000 UT) TABS Take 5,000 Units by mouth daily.  glipiZIDE  (GLUCOTROL  XL) 5 MG 24 hr tablet Take 1 tablet (5 mg total) by mouth daily with breakfast. 30 tablet 4   lisinopril  (ZESTRIL ) 40 MG tablet Take 1 tablet (40 mg total) by mouth daily. 90 tablet 1   Magnesium  250 MG TABS Take 250 mg by mouth daily.     pravastatin  (PRAVACHOL ) 10 MG tablet Take 1 tablet (10 mg total) by mouth daily. 30 tablet 5   predniSONE  (DELTASONE ) 20 MG tablet Take two tablets daily for 5 days 10 tablet 0   No current facility-administered medications on file prior to visit.    There are no Patient Instructions on file for this visit. No follow-ups on file.   Catalina Salasar E Hallie Ishida, NP

## 2024-08-12 ENCOUNTER — Encounter: Admitting: Physical Therapy

## 2024-08-20 ENCOUNTER — Encounter: Admitting: Physical Therapy

## 2024-08-22 ENCOUNTER — Ambulatory Visit: Admitting: Physical Therapy

## 2024-08-26 ENCOUNTER — Encounter: Payer: Self-pay | Admitting: Physical Therapy

## 2024-08-26 ENCOUNTER — Ambulatory Visit: Attending: Cardiology | Admitting: Physical Therapy

## 2024-08-26 VITALS — BP 185/93 | HR 70

## 2024-08-26 DIAGNOSIS — R269 Unspecified abnormalities of gait and mobility: Secondary | ICD-10-CM | POA: Diagnosis present

## 2024-08-26 DIAGNOSIS — M6281 Muscle weakness (generalized): Secondary | ICD-10-CM | POA: Insufficient documentation

## 2024-08-26 DIAGNOSIS — R2689 Other abnormalities of gait and mobility: Secondary | ICD-10-CM | POA: Diagnosis present

## 2024-08-26 DIAGNOSIS — R2681 Unsteadiness on feet: Secondary | ICD-10-CM | POA: Diagnosis present

## 2024-08-26 NOTE — Therapy (Signed)
 OUTPATIENT PHYSICAL THERAPY PROSTHETICS TREATMENT   Patient Name: Jeremy Padilla MRN: [id number] DOB:Jul 26, 1966, 58 y.o., male Today's Date: 08/26/2024  PCP: Carin Gauze, NP REFERRING PROVIDER: Carin Gauze, NP  END OF SESSION:  PT End of Session - 08/26/24 1606     Visit Number 18    Number of Visits 25   17 + 8   Date for PT Re-Evaluation 09/19/24   pushed out due to scheduling delay   Authorization Type Medicare A & B with secondary Medicaid    Progress Note Due on Visit 20    PT Start Time 1528    PT Stop Time 1552    PT Time Calculation (min) 24 min    Equipment Utilized During Treatment Gait belt    Activity Tolerance Patient tolerated treatment well;Treatment limited secondary to medical complications (Comment)   severe HTN   Behavior During Therapy [facility] for tasks assessed/performed           Past Medical History:  Diagnosis Date   Abscess 10/05/2015   Diabetes mellitus without complication (HCC)    Heart murmur    Herpes exposure    Hyperlipidemia    Hypertension    Past Surgical History:  Procedure Laterality Date   AMPUTATION Left 06/23/2020   Procedure: AMPUTATION ABOVE KNEE;  Surgeon: Marea Selinda RAMAN, MD;  Location: [facility];  Service: General;  Laterality: Left;   LOWER EXTREMITY ANGIOGRAPHY Left 04/09/2019   Procedure: LOWER EXTREMITY ANGIOGRAPHY;  Surgeon: Marea Selinda RAMAN, MD;  Location: [facility];  Service: Cardiovascular;  Laterality: Left;   LOWER EXTREMITY ANGIOGRAPHY Left 04/19/2020   Procedure: Lower Extremity Angiography;  Surgeon: Marea Selinda RAMAN, MD;  Location: [facility];  Service: Cardiovascular;  Laterality: Left;   LOWER EXTREMITY ANGIOGRAPHY Left 04/20/2020   Procedure: Lower Extremity Angiography;  Surgeon: Marea Selinda RAMAN, MD;  Location: [facility];  Service: Cardiovascular;  Laterality: Left;   LOWER EXTREMITY INTERVENTION N/A 05/30/2019   Procedure: LOWER EXTREMITY INTERVENTION;  Surgeon: Jama Cordella MATSU,  MD;  Location: [facility];  Service: Cardiovascular;  Laterality: N/A;   PENILE PROSTHESIS IMPLANT N/A 02/05/2024   Procedure: INSERTION OF INFLATABLE PENILE PROSTHESIS;  Surgeon: Lovie Arlyss CROME, MD;  Location: [facility];  Service: Urology;  Laterality: N/A;  135 MINUTES NEEDED FOR CASE   WRIST SURGERY Left    Patient Active Problem List   Diagnosis Date Noted   Skin abrasion 01/29/2024   Edema of toe 08/20/2023   Toe infection 08/20/2023   Diabetic polyneuropathy associated with diabetes mellitus due to underlying condition (HCC) 05/18/2023   Benign mole 03/20/2023   Mixed hyperlipidemia 03/20/2023   Hx of AKA (above knee amputation), left (HCC) 09/10/2020   Protein-calorie malnutrition, severe 06/23/2020   Pressure injury of skin 06/22/2020   BKA stump complication (HCC) 06/21/2020   Hyponatremia 06/21/2020   Dehydration 06/21/2020   Ischemic leg 04/20/2020   Ischemia 04/16/2020   Ischemia of left lower extremity 05/30/2019   PAD (peripheral artery disease) (HCC) 04/08/2019   Lacunar infarction (HCC) 08/03/2017   Bruit of left carotid artery 08/03/2017   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) 08/02/2017   Abscess or cellulitis, neck 10/08/2015   Hyperglycemia 10/08/2015   Hypertension goal BP (blood pressure) < 140/90 10/08/2015   Heart murmur 10/08/2015    ONSET DATE: July 2021 (initial amputation)  REFERRING DIAG: S10.387 (ICD-10-CM) - Hx of AKA (above knee amputation), left (HCC) I73.9 (  ICD-10-CM) - PAD (peripheral artery disease) (HCC)  THERAPY DIAG:  Other abnormalities of gait and mobility  Unsteadiness on feet  Muscle weakness (generalized)  Abnormality of gait and mobility  Rationale for Evaluation and Treatment: Rehabilitation  SUBJECTIVE:   SUBJECTIVE STATEMENT: Denies falls.  He has been on vacation.  Can feel his BP is elevated today, but does not feel bad. Pt accompanied by: self (drives  himself)  PERTINENT HISTORY: HTN (goal <140/90), DM2, lacunar infarction, PAD, heart murmur, diabetic retinopathy (treated with shots)  PAIN:  Are you having pain? 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  Has been on 2 vacations back to back and can feel his BP is high as he feels amped.  Provided water  and quiet monitored rest b/w readings.  See edu section, reviewed [employer] Fast criteria.   PATIENT EDUCATION: Education details: Please monitor BP - limits for therapy/general exercise, s/s of CVA and need for 911 vs following up with PCP if not able to bring down to goal level with diet and maintained medication regimen at home in coming days.   Person educated: Patient Education method: Explanation Education comprehension: verbalized understanding and needs further education  HOME EXERCISE PROGRAM: Access Code: [email or web address] URL: [email or web address] Date: 05/09/2024 Prepared by: Daved Bull  Exercises - Sidelying Hip Abduction wtih Flexion and Extension (BKA)  - 1 x daily - 7 x weekly - 3 sets - 10 reps - Sidelying Hip Abduction (AKA)  - 1 x daily - 7 x weekly - 3 sets - 10 reps - Sidelying Hip Circles (AKA)  - 1 x daily - 7 x weekly - 3 sets - 10 reps - Supine Psoas Stretch (AKA)  - 1 x daily - 7 x weekly - 3 sets - 10 reps  AT [facility] FIND YOUR MIDLINE POSITION AND PLACE FEET EQUAL DISTANCE FROM THE MIDLINE.  Try to find this position when standing  still for activities.    USE TAPE ON FLOOR TO MARK THE MIDLINE POSITION. You also should try to feel with your limb pressure in socket.  You are trying to feel with limb what you used to feel with the bottom of your foot.   Side to Side Shift: Moving your hips only (not shoulders): move weight onto your left leg, HOLD/FEEL.  Move back to equal weight on each leg, HOLD/FEEL. Move weight onto  your right leg, HOLD/FEEL. Move back to equal weight on each leg, HOLD/FEEL. Repeat.  Start with both hands on sink, progress to right hand only, then no hands.  Front to Back Shift: Moving your hips only (not shoulders): move your weight forward onto your toes, HOLD/FEEL. Move your weight back to equal Flat Foot on both legs, HOLD/FEEL. Move your weight back onto your heels, HOLD/FEEL. Move your weight back to equal on both legs, HOLD/FEEL. Repeat.   tart with both hands on sink, progress to right hand only, then no hands. Moving Cones / Cups: With equal weight on each leg: Hold on with one hand the first time, then progress to no hand supports. Move cups from one side of sink to the other. Place cups ~2" out of your reach, progress to 10" beyond reach.  Place one hand in middle of sink and reach with other hand. Do both arms.  Then hover one hand and move cups with other hand. -just use a crossbody reach to end of safe weight shift ROM Overhead/Upward Reaching: alternated reaching up to top cabinets or ceiling if no cabinets present. Keep equal weight on each leg. Start with one hand support on counter while other hand reaches and progress to no hand support with reaching.  ace one hand in middle of sink and reach with other hand. Do both arms.  Then hover one hand and move cups with other hand.  5.   Looking Over Shoulders: With equal weight on each leg: alternate turning to look over your shoulders with one hand support on counter as needed.  Start with head motions only to look in front of shoulder, then even with  shoulder and progress to looking behind you. To look to side, move head /eyes, then shoulder on side looking pulls back, shift more weight to side looking and pull hip back. ace one hand in middle of sink and let go with other hand so your shoulder can pull back. Switch hands to look other way.   Then hover one hand and move cups with other hand.  6.  Stepping with leg that is not amputated:  Move items under cabinet out of your way. Shift your hips/pelvis so weight on prosthesis. SLOWLY step other leg so front of foot is in cabinet. Then step back to floor. - just hike the hip and place the foot back in the starting spot  ASSESSMENT:  CLINICAL IMPRESSION: Session significantly limited by elevated BP outside safe limits for therapy likely due to recent diet changes due to 2 vacations and abnormal routine.  He was educated on emergent s/s and has none at current.  He declined EMS, but reports he will continue to monitor BP at home.  Will reassess next session and continue per POC.  OBJECTIVE IMPAIRMENTS: Abnormal gait, decreased activity tolerance, decreased balance, decreased endurance, decreased knowledge of use of DME, decreased mobility, difficulty walking, decreased strength, decreased safety awareness, impaired vision/preception, improper body mechanics, postural dysfunction, and prosthetic dependency .   ACTIVITY LIMITATIONS: carrying, lifting, bending, standing, squatting, stairs, transfers, and locomotion level  PARTICIPATION LIMITATIONS: community activity, occupation, and yard work  PERSONAL FACTORS: Past/current experiences, Time since onset of injury/illness/exacerbation, and 3+ comorbidities: lacunar infarction, HTN, diabetic retinopathy are also affecting patient's functional outcome.   REHAB POTENTIAL: Good  CLINICAL DECISION MAKING: Evolving/moderate complexity  EVALUATION COMPLEXITY: Moderate   GOALS: Goals reviewed with patient? Yes  SHORT TERM GOALS: Target date:  06/06/2024  Pt will be independent and  compliant with introductory prosthetic management, strengthening and standing balance HEP in order to maintain functional progress and improve mobility. Baseline:  Ind and compliant per report 7/1 Goal status: MET  2.  Pt will be independent in sock ply adjustment in order to improve wear tolerance and prosthetic fit. Baseline: pt still unsure of ply he wears at baseline due to wear of socks, has not had much need for adjustment but understands he should add if socket is loose (7/1) Goal status: PARTIALLY MET  3.  Pt will decrease 5xSTS to </=12 seconds w/ no more than single UE support and minimum momentum to initiate transfer in order to demonstrate decreased risk for falls and improved functional bilateral LE strength and power. Baseline:  12.75 sec w/ BUE and moderate use of momentum to initiate stand (6/3); 11.47 sec w/ BUE, most momentum use on initial 2 stands (7/1) Goal status: PARTIALLY MET  4.  Pt will demonstrate a gait speed of >/=1.54 feet/sec in order to decrease risk for falls. Baseline: 1.34 ft/sec w/ 2WW SBA (6/3); 1.25 ft/sec w/ 2WW mod I (7/1) Goal status: NOT MET  5.  Pt will improve AMPPRO to >/=32/47 in order to demonstrate improved functional capacity on current prosthetic componentry. Baseline: 27/47 (6/3); 30/47 (7/1) Goal status: IN PROGRESS  LONG TERM GOALS: Target date: 07/04/2024  Pt will be independent and compliant with advanced prosthetic management, strengthening and standing balance HEP in order to maintain functional progress and improve mobility. Baseline: Reports ind and compliance (7/29) Goal status: MET  2.   5xSTS to be assessed w/ LTG set as appropriate. Baseline: Assessed 6/3 - no LTG needed at this time. Goal status: REVISED - D/C'd  3.  Pt will demonstrate a gait speed of >/=1.74 feet/sec in order to decrease risk for falls. Baseline: 1.34 ft/sec w/ 2WW SBA (6/3); 1.38 ft/sec (7/29) Goal status: NOT  MET  4.  Pt will improve AMPPRO to >/=37/47 in order to demonstrate improved functional capacity on current prosthetic componentry. Baseline: 27/47 (6/3); 38/47 (7/29) Goal status: MET  5.  Pt will navigate 4 stairs using step-to pattern and bilateral rails at mod I level to demonstrate safety in home environment. Baseline: Pt reports ind at home; mod I x4 stairs step to (7/29) Goal status:  MET  6.  Pt will initiate ambulation in clinic >/=50 ft with least restrictive cane option in order to demonstrate improved upright stability and prosthetic management. Baseline: Pt attempts a few feet with cane in home only; [facility] x115' at [facility] (7/29) Goal status: MET  GOALS (at 2/70 re-cert): Goals reviewed with patient? Yes  SHORT TERM GOALS: Target date: 08/08/2024  1.  Pt will be independent in sock ply adjustment in order to improve wear tolerance and prosthetic fit. Baseline: pt waiting to obtain new socks (7/29) Goal status: INITIAL  2.  Pt will demonstrate a gait speed of >/=1.54 feet/sec in order to decrease risk for falls. Baseline: 1.38 ft/sec (7/29) Goal status: INITIAL  3.  Pt will perform curb step using least restrictive cane option at no more than SBA level in order to improve community obstacle management. Baseline: CGA-minA Goal status: INITIAL  LONG TERM GOALS: Target date: 09/05/2024  1.  Pt will demonstrate a gait speed of >/=1.74 feet/sec in order to decrease risk for falls. Baseline: 1.38 ft/sec (7/29) Goal status:  INITIAL  2.  Pt will ambulate up and down ramp w/ SPC at no more than SBA in order to demonstrate improved  [place]ista. Baseline: CGA-minA Goal status: INITIAL  3.  Pt will navigate 4 stairs using step-to pattern unilateral rail and cane as needed at mod I level to demonstrate safety in home and community environments. Baseline: mod I x4 stairs step to bil rails (7/29) Goal status:  INITIAL  4.  Pt will ambulate >/=500 feet over level and  unlevel ground with least restrictive cane option and no more than SBA level of assist to promote household and community access. Baseline: SPC x115' at [facility] level surface only (7/29) Goal status: INITIAL  PLAN:  PT FREQUENCY: 2x/week + 1x/wk  PT DURATION: 8 weeks + 8 weeks  PLANNED INTERVENTIONS: [id number]- PT Re-evaluation, 97750- Physical Performance Testing, 97110-Therapeutic exercises, 97530- Therapeutic activity, [id number]- Neuromuscular re-education, 97535- Self Care, [id number]- Manual therapy, [id number]- Gait training, [id number]- Prosthetic Initial , [phone]- Electrical stimulation (manual), Patient/Family education, Balance training, Stair training, Vestibular training, and DME instructions  PLAN FOR NEXT SESSION:  SPC cane curb/ramp/stairs - continue per pt request, esp stairs - he wants to work on step downs and reverse up again!  Outside if weather nice!  Hurdles/turns/compliant surfaces; Will just assess STG/LTG at last scheduled appt!  Is BP improved?  Daved KATHEE Bull, PT, DPT  08/26/2024, 4:18 PM

## 2024-09-03 ENCOUNTER — Ambulatory Visit: Admitting: Physical Therapy

## 2024-09-03 ENCOUNTER — Encounter: Payer: Self-pay | Admitting: Physical Therapy

## 2024-09-03 VITALS — BP 138/74 | HR 61

## 2024-09-03 DIAGNOSIS — R2689 Other abnormalities of gait and mobility: Secondary | ICD-10-CM | POA: Diagnosis not present

## 2024-09-03 DIAGNOSIS — R269 Unspecified abnormalities of gait and mobility: Secondary | ICD-10-CM

## 2024-09-03 DIAGNOSIS — R2681 Unsteadiness on feet: Secondary | ICD-10-CM

## 2024-09-03 DIAGNOSIS — M6281 Muscle weakness (generalized): Secondary | ICD-10-CM

## 2024-09-03 NOTE — Therapy (Unsigned)
 OUTPATIENT PHYSICAL THERAPY PROSTHETICS TREATMENT   Patient Name: Jeremy Padilla MRN: [id number] DOB:08-Sep-1966, 58 y.o., male Today's Date: 09/03/2024  PCP: Carin Gauze, NP REFERRING PROVIDER: Carin Gauze, NP  END OF SESSION:  PT End of Session - 09/03/24 1544     Visit Number 19    Number of Visits 25   17 + 8   Date for Recertification  09/19/24   pushed out due to scheduling delay   Authorization Type Medicare A & B with secondary Medicaid    Progress Note Due on Visit 20    PT Start Time 1539    PT Stop Time 1619    PT Time Calculation (min) 40 min    Equipment Utilized During Treatment Gait belt    Activity Tolerance Patient tolerated treatment well    Behavior During Therapy WFL for tasks assessed/performed           Past Medical History:  Diagnosis Date   Abscess 10/05/2015   Diabetes mellitus without complication (HCC)    Heart murmur    Herpes exposure    Hyperlipidemia    Hypertension    Past Surgical History:  Procedure Laterality Date   AMPUTATION Left 06/23/2020   Procedure: AMPUTATION ABOVE KNEE;  Surgeon: Marea Selinda RAMAN, MD;  Location: [facility];  Service: General;  Laterality: Left;   LOWER EXTREMITY ANGIOGRAPHY Left 04/09/2019   Procedure: LOWER EXTREMITY ANGIOGRAPHY;  Surgeon: Marea Selinda RAMAN, MD;  Location: [facility];  Service: Cardiovascular;  Laterality: Left;   LOWER EXTREMITY ANGIOGRAPHY Left 04/19/2020   Procedure: Lower Extremity Angiography;  Surgeon: Marea Selinda RAMAN, MD;  Location: [facility];  Service: Cardiovascular;  Laterality: Left;   LOWER EXTREMITY ANGIOGRAPHY Left 04/20/2020   Procedure: Lower Extremity Angiography;  Surgeon: Marea Selinda RAMAN, MD;  Location: [facility];  Service: Cardiovascular;  Laterality: Left;   LOWER EXTREMITY INTERVENTION N/A 05/30/2019   Procedure: LOWER EXTREMITY INTERVENTION;  Surgeon: Jama Cordella MATSU, MD;  Location: [facility];  Service: Cardiovascular;   Laterality: N/A;   PENILE PROSTHESIS IMPLANT N/A 02/05/2024   Procedure: INSERTION OF INFLATABLE PENILE PROSTHESIS;  Surgeon: Lovie Arlyss CROME, MD;  Location: [facility];  Service: Urology;  Laterality: N/A;  135 MINUTES NEEDED FOR CASE   WRIST SURGERY Left    Patient Active Problem List   Diagnosis Date Noted   Skin abrasion 01/29/2024   Edema of toe 08/20/2023   Toe infection 08/20/2023   Diabetic polyneuropathy associated with diabetes mellitus due to underlying condition (HCC) 05/18/2023   Benign mole 03/20/2023   Mixed hyperlipidemia 03/20/2023   Hx of AKA (above knee amputation), left (HCC) 09/10/2020   Protein-calorie malnutrition, severe 06/23/2020   Pressure injury of skin 06/22/2020   BKA stump complication (HCC) 06/21/2020   Hyponatremia 06/21/2020   Dehydration 06/21/2020   Ischemic leg 04/20/2020   Ischemia 04/16/2020   Ischemia of left lower extremity 05/30/2019   PAD (peripheral artery disease) 04/08/2019   Lacunar infarction (HCC) 08/03/2017   Bruit of left carotid artery 08/03/2017   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) 08/02/2017   Abscess or cellulitis, neck 10/08/2015   Hyperglycemia 10/08/2015   Hypertension goal BP (blood pressure) < 140/90 10/08/2015   Heart murmur 10/08/2015    ONSET DATE: July 2021 (initial amputation)  REFERRING DIAG: S10.387 (ICD-10-CM) - Hx of AKA (above knee amputation), left (HCC) I73.9 (ICD-10-CM) - PAD (peripheral artery disease) (HCC)  THERAPY DIAG:  Other abnormalities of gait and mobility  Unsteadiness on feet  Muscle weakness (generalized)  Abnormality of gait and mobility  Rationale for Evaluation and Treatment: Rehabilitation  SUBJECTIVE:   SUBJECTIVE STATEMENT: Denies falls.  He has been doing leaf blowing today. Pt accompanied by: self (drives himself)  PERTINENT HISTORY: HTN (goal <140/90), DM2, lacunar infarction, PAD, heart murmur, diabetic retinopathy (treated with  shots)  PAIN:  Are you having pain? 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   PATIENT GOALS: to walk better and maybe get a better prosthetic, to be more independent                                                                                                                            TREATMENT LUE in sitting prior to interventions: Today's Vitals   09/03/24 1543  BP: 138/74  Pulse: 61   GAIT: Gait pattern: step to pattern, step through pattern, decreased arm swing- Left, decreased step length- Right, decreased stride length, decreased hip/knee flexion- Left, decreased trunk rotation, trunk flexed, and narrow BOS Distance walked: 500 ft + 8 ftx2 Assistive device utilized: Single point cane Level of assistance: SBA and CGA Comments: Pt ambulates w/ improved stability over grass and mulch.  Cues to shorten step length w/ incline/decline and improve step through w/ level surface.  Noted increased pelvic rotation on L w/ level sidewalk surface and cues for step through.  No LOB.  Cues for sequencing obstacle management.  -L heel taps x20 progressing to no UE support SBA-CGA, some R trendelenburg noted -Reverse step ups from bottom of staircase to top 4x4 6 steps w/ BUE support SBA, cues for placing R foot completely on step to prevent sliding anteriorly and prosthetic first on step down  PATIENT EDUCATION: Education details: Please monitor BP at home. Person educated: Patient Education method: Explanation Education comprehension: verbalized understanding and needs further education  HOME EXERCISE PROGRAM: Access Code: [email or web address] URL: [email or web address] Date: 05/09/2024 Prepared by: Daved Bull  Exercises - Sidelying Hip Abduction wtih Flexion and Extension (BKA)  - 1 x daily - 7 x weekly - 3 sets - 10 reps - Sidelying Hip Abduction (AKA)  - 1 x daily - 7 x weekly - 3 sets - 10 reps - Sidelying Hip Circles  (AKA)  - 1 x daily - 7 x weekly - 3 sets - 10 reps - Supine Psoas Stretch (AKA)  - 1 x daily - 7 x weekly - 3 sets - 10 reps  AT [facility] FIND YOUR MIDLINE POSITION AND PLACE FEET EQUAL DISTANCE FROM THE MIDLINE.  Try to find this position when standing still for activities.    USE TAPE ON FLOOR TO MARK THE MIDLINE POSITION. You also should try to feel with your limb pressure in socket.  You are trying to feel with limb what you used to feel with the bottom of your foot.   Side to Side  Shift: Moving your hips only (not shoulders): move weight onto your left leg, HOLD/FEEL.  Move back to equal weight on each leg, HOLD/FEEL. Move weight onto your right leg, HOLD/FEEL. Move back to equal weight on each leg, HOLD/FEEL. Repeat.  Start with both hands on sink, progress to right hand only, then no hands.  Front to Back Shift: Moving your hips only (not shoulders): move your weight forward onto your toes, HOLD/FEEL. Move your weight back to equal Flat Foot on both legs, HOLD/FEEL. Move your weight back onto your heels, HOLD/FEEL. Move your weight back to equal on both legs, HOLD/FEEL. Repeat.   tart with both hands on sink, progress to right hand only, then no hands. Moving Cones / Cups: With equal weight on each leg: Hold on with one hand the first time, then progress to no hand supports. Move cups from one side of sink to the other. Place cups ~2" out of your reach, progress to 10" beyond reach.  Place one hand in middle of sink and reach with other hand. Do both arms.  Then hover one hand and move cups with other hand. -just use a crossbody reach to end of safe weight shift ROM Overhead/Upward Reaching: alternated reaching up to top cabinets or ceiling if no cabinets present. Keep equal weight on each leg. Start with one hand support on counter while other hand reaches and progress to no hand support with reaching.  ace one hand in middle of sink and reach with other hand. Do both arms.  Then hover one hand and  move cups with other hand.  5.   Looking Over Shoulders: With equal weight on each leg: alternate turning to look over your shoulders with one hand support on counter as needed.  Start with head motions only to look in front of shoulder, then even with shoulder and progress to looking behind you. To look to side, move head /eyes, then shoulder on side looking pulls back, shift more weight to side looking and pull hip back. ace one hand in middle of sink and let go with other hand so your shoulder can pull back. Switch hands to look other way.   Then hover one hand and move cups with other hand.  6.  Stepping with leg that is not amputated:  Move items under cabinet out of your way. Shift your hips/pelvis so weight on prosthesis. SLOWLY step other leg so front of foot is in cabinet. Then step back to floor. - just hike the hip and place the foot back in the starting spot  ASSESSMENT:  CLINICAL IMPRESSION: Session significantly limited by elevated BP outside safe limits for therapy likely due to recent diet changes due to 2 vacations and abnormal routine.  He was educated on emergent s/s and has none at current.  He declined EMS, but reports he will continue to monitor BP at home.  Will reassess next session and continue per POC.  OBJECTIVE IMPAIRMENTS: Abnormal gait, decreased activity tolerance, decreased balance, decreased endurance, decreased knowledge of use of DME, decreased mobility, difficulty walking, decreased strength, decreased safety awareness, impaired vision/preception, improper body mechanics, postural dysfunction, and prosthetic dependency .   ACTIVITY LIMITATIONS: carrying, lifting, bending, standing, squatting, stairs, transfers, and locomotion level  PARTICIPATION LIMITATIONS: community activity, occupation, and yard work  PERSONAL FACTORS: Past/current experiences, Time since onset of injury/illness/exacerbation, and 3+ comorbidities: lacunar infarction, HTN, diabetic retinopathy  are also affecting patient's functional outcome.   REHAB POTENTIAL: Good  CLINICAL DECISION MAKING:  Evolving/moderate complexity  EVALUATION COMPLEXITY: Moderate   GOALS: Goals reviewed with patient? Yes  SHORT TERM GOALS: Target date: 06/06/2024  Pt will be independent and compliant with introductory prosthetic management, strengthening and standing balance HEP in order to maintain functional progress and improve mobility. Baseline:  Ind and compliant per report 7/1 Goal status: MET  2.  Pt will be independent in sock ply adjustment in order to improve wear tolerance and prosthetic fit. Baseline: pt still unsure of ply he wears at baseline due to wear of socks, has not had much need for adjustment but understands he should add if socket is loose (7/1) Goal status: PARTIALLY MET  3.  Pt will decrease 5xSTS to </=12 seconds w/ no more than single UE support and minimum momentum to initiate transfer in order to demonstrate decreased risk for falls and improved functional bilateral LE strength and power. Baseline:  12.75 sec w/ BUE and moderate use of momentum to initiate stand (6/3); 11.47 sec w/ BUE, most momentum use on initial 2 stands (7/1) Goal status: PARTIALLY MET  4.  Pt will demonstrate a gait speed of >/=1.54 feet/sec in order to decrease risk for falls. Baseline: 1.34 ft/sec w/ 2WW SBA (6/3); 1.25 ft/sec w/ 2WW mod I (7/1) Goal status: NOT MET  5.  Pt will improve AMPPRO to >/=32/47 in order to demonstrate improved functional capacity on current prosthetic componentry. Baseline: 27/47 (6/3); 30/47 (7/1) Goal status: IN PROGRESS  LONG TERM GOALS: Target date: 07/04/2024  Pt will be independent and compliant with advanced prosthetic management, strengthening and standing balance HEP in order to maintain functional progress and improve mobility. Baseline: Reports ind and compliance (7/29) Goal status: MET  2.   5xSTS to be assessed w/ LTG set as appropriate. Baseline:  Assessed 6/3 - no LTG needed at this time. Goal status: REVISED - D/C'd  3.  Pt will demonstrate a gait speed of >/=1.74 feet/sec in order to decrease risk for falls. Baseline: 1.34 ft/sec w/ 2WW SBA (6/3); 1.38 ft/sec (7/29) Goal status: NOT MET  4.  Pt will improve AMPPRO to >/=37/47 in order to demonstrate improved functional capacity on current prosthetic componentry. Baseline: 27/47 (6/3); 38/47 (7/29) Goal status: MET  5.  Pt will navigate 4 stairs using step-to pattern and bilateral rails at mod I level to demonstrate safety in home environment. Baseline: Pt reports ind at home; mod I x4 stairs step to (7/29) Goal status:  MET  6.  Pt will initiate ambulation in clinic >/=50 ft with least restrictive cane option in order to demonstrate improved upright stability and prosthetic management. Baseline: Pt attempts a few feet with cane in home only; [facility] x115' at [facility] (7/29) Goal status: MET  GOALS (at 2/70 re-cert): Goals reviewed with patient? Yes  SHORT TERM GOALS: Target date: 08/08/2024  1.  Pt will be independent in sock ply adjustment in order to improve wear tolerance and prosthetic fit. Baseline: pt waiting to obtain new socks (7/29) Goal status: INITIAL  2.  Pt will demonstrate a gait speed of >/=1.54 feet/sec in order to decrease risk for falls. Baseline: 1.38 ft/sec (7/29) Goal status: INITIAL  3.  Pt will perform curb step using least restrictive cane option at no more than SBA level in order to improve community obstacle management. Baseline: CGA-minA Goal status: INITIAL  LONG TERM GOALS: Target date: 09/05/2024  1.  Pt will demonstrate a gait speed of >/=1.74 feet/sec in order to decrease risk for falls. Baseline: 1.38 ft/sec (  7/29) Goal status:  INITIAL  2.  Pt will ambulate up and down ramp w/ SPC at no more than SBA in order to demonstrate improved community management. Baseline: CGA-minA Goal status: INITIAL  3.  Pt will navigate 4 stairs using  step-to pattern unilateral rail and cane as needed at mod I level to demonstrate safety in home and community environments. Baseline: mod I x4 stairs step to bil rails (7/29) Goal status:  INITIAL  4.  Pt will ambulate >/=500 feet over level and unlevel ground with least restrictive cane option and no more than SBA level of assist to promote household and community access. Baseline: SPC x115' at [facility] level surface only (7/29) Goal status: INITIAL  PLAN:  PT FREQUENCY: 2x/week + 1x/wk  PT DURATION: 8 weeks + 8 weeks  PLANNED INTERVENTIONS: [id number]- PT Re-evaluation, 97750- Physical Performance Testing, 97110-Therapeutic exercises, 97530- Therapeutic activity, [id number]- Neuromuscular re-education, 97535- Self Care, [id number]- Manual therapy, [id number]- Gait training, [phone]- Prosthetic Initial , [phone]- Electrical stimulation (manual), Patient/Family education, Balance training, Stair training, Vestibular training, and DME instructions  PLAN FOR NEXT SESSION:  SPC cane curb/ramp/stairs - continue per pt request, esp stairs - he wants to work on step downs and reverse up again!  Outside if weather nice!  Hurdles/turns/compliant surfaces; Will just assess STG/LTG at last scheduled appt!  Check BP!  Daved KATHEE Bull, PT, DPT  09/03/2024, 4:59 PM

## 2024-09-08 ENCOUNTER — Ambulatory Visit: Payer: Self-pay | Admitting: Physical Therapy

## 2024-09-08 VITALS — BP 149/57 | HR 72

## 2024-09-08 DIAGNOSIS — M6281 Muscle weakness (generalized): Secondary | ICD-10-CM

## 2024-09-08 DIAGNOSIS — R2689 Other abnormalities of gait and mobility: Secondary | ICD-10-CM

## 2024-09-08 DIAGNOSIS — R2681 Unsteadiness on feet: Secondary | ICD-10-CM

## 2024-09-08 NOTE — Therapy (Signed)
 OUTPATIENT PHYSICAL THERAPY PROSTHETICS TREATMENT- 20TH VISIT PROGRESS NOTE    Patient Name: Jeremy Padilla MRN: [id number] DOB:1966/07/25, 58 y.o., male Today's Date: 09/08/2024  PCP: Carin Gauze, NP REFERRING PROVIDER: Carin Gauze, NP  Physical Therapy Progress Note   Dates of Reporting Period:05/09/24 - 09/08/24  See Note below for Objective Data and Assessment of Progress/Goals.    END OF SESSION:  PT End of Session - 09/08/24 1535     Visit Number 20    Number of Visits 25   17 + 8   Date for Recertification  09/19/24   pushed out due to scheduling delay   Authorization Type Medicare A & B with secondary Medicaid    Progress Note Due on Visit 20    PT Start Time 1533    PT Stop Time 1614    PT Time Calculation (min) 41 min    Equipment Utilized During Treatment Gait belt    Activity Tolerance Patient tolerated treatment well    Behavior During Therapy WFL for tasks assessed/performed            Past Medical History:  Diagnosis Date   Abscess 10/05/2015   Diabetes mellitus without complication (HCC)    Heart murmur    Herpes exposure    Hyperlipidemia    Hypertension    Past Surgical History:  Procedure Laterality Date   AMPUTATION Left 06/23/2020   Procedure: AMPUTATION ABOVE KNEE;  Surgeon: Marea Selinda RAMAN, MD;  Location: [facility];  Service: General;  Laterality: Left;   LOWER EXTREMITY ANGIOGRAPHY Left 04/09/2019   Procedure: LOWER EXTREMITY ANGIOGRAPHY;  Surgeon: Marea Selinda RAMAN, MD;  Location: [facility];  Service: Cardiovascular;  Laterality: Left;   LOWER EXTREMITY ANGIOGRAPHY Left 04/19/2020   Procedure: Lower Extremity Angiography;  Surgeon: Marea Selinda RAMAN, MD;  Location: [facility];  Service: Cardiovascular;  Laterality: Left;   LOWER EXTREMITY ANGIOGRAPHY Left 04/20/2020   Procedure: Lower Extremity Angiography;  Surgeon: Marea Selinda RAMAN, MD;  Location: [facility];  Service: Cardiovascular;  Laterality: Left;   LOWER  EXTREMITY INTERVENTION N/A 05/30/2019   Procedure: LOWER EXTREMITY INTERVENTION;  Surgeon: Jama Cordella MATSU, MD;  Location: [facility];  Service: Cardiovascular;  Laterality: N/A;   PENILE PROSTHESIS IMPLANT N/A 02/05/2024   Procedure: INSERTION OF INFLATABLE PENILE PROSTHESIS;  Surgeon: Lovie Arlyss CROME, MD;  Location: [facility];  Service: Urology;  Laterality: N/A;  135 MINUTES NEEDED FOR CASE   WRIST SURGERY Left    Patient Active Problem List   Diagnosis Date Noted   Skin abrasion 01/29/2024   Edema of toe 08/20/2023   Toe infection 08/20/2023   Diabetic polyneuropathy associated with diabetes mellitus due to underlying condition (HCC) 05/18/2023   Benign mole 03/20/2023   Mixed hyperlipidemia 03/20/2023   Hx of AKA (above knee amputation), left (HCC) 09/10/2020   Protein-calorie malnutrition, severe 06/23/2020   Pressure injury of skin 06/22/2020   BKA stump complication (HCC) 06/21/2020   Hyponatremia 06/21/2020   Dehydration 06/21/2020   Ischemic leg 04/20/2020   Ischemia 04/16/2020   Ischemia of left lower extremity 05/30/2019   PAD (peripheral artery disease) 04/08/2019   Lacunar infarction (HCC) 08/03/2017   Bruit of left carotid artery 08/03/2017   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 Denies falls or acute changes.  Wearing between  9-15 ply today, unsure the exact number    Pt accompanied by: self (drives himself)  PERTINENT HISTORY: HTN (goal <140/90), DM2, lacunar infarction, PAD, heart murmur, diabetic retinopathy (treated with shots)  PAIN:  Are you having pain? 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   PATIENT GOALS: to walk better and maybe get a better prosthetic, to be more independent  VITALS  Vitals:   09/08/24 1539  BP: (!) 149/57  Pulse: 72                                                                                                                              TREATMENT Self-care/home management  Assessed vitals in LUE (see above) and systolic BP elevated but within limits for session  Ther Act   STAIRS:  Level of Assistance: SBA  Stair Negotiation Technique: Step to Pattern Backwards Forwards With use of AD: cane with Single Rail on Left Bilateral Rails  Number of Stairs: 4 x8 reps    Height of Stairs: 6  Comments: Pt practiced ascending/descending stairs w/use of bilateral rails and progressed to using cane only. Pt most challenged w/descent and leads w/LLE but tends to lean backwards due to fear of falling, resulting in posterior instability. Cued pt to maintain upright posture and reduce posterior lean as pt counterbalances w/trunk flexion which could cause L knee to unlock.   Gait Training  In // bars, practiced step-through gait pattern to facilitate L knee flexion and swing-through pattern rather than circumduction. Pt very challenged by this, frequently taking either too large of a step or regressing to step-to pattern. When pt able to facilitate step-through pattern, pt reported feeling weird and increased pressure in groin. Noted significant ER and IR of LLE due to large socket size of prosthesis. Progressed to adding 4 hurdle to step over to further emphasize L knee flexion, weightbearing tolerance on LLE and increased step  length w/RLE. Pt w/improved success of clearing hurdle if he leads w/LLE over hurdle.    GAIT: Gait pattern: step to pattern, step through pattern, decreased arm swing- Left, decreased step length- Right, decreased stride length, decreased hip/knee flexion- Left, circumduction- Left, decreased trunk rotation, trunk flexed, and narrow BOS Distance walked: 115' plus various clinic distances  Assistive device utilized: Environmental consultant - 2 wheeled Level of assistance: SBA Comments: Pt ambulated into clinic w/significant step-to pattern, so provided min-mod multimodal cues for pt to practice step-through pattern and facilitation of L knee flexion w/gait. Encouraged pt  to practice this at home    PATIENT EDUCATION: Education details: Continue working on step-through pattern at home w/RW, plan to DC next session  Person educated: Patient Education method: Programmer, multimedia, Facilities manager, and Verbal cues Education comprehension: verbalized understanding, verbal cues required, and needs further education  HOME EXERCISE PROGRAM: Access Code: [email or web address] URL: [email or web address] Date: 05/09/2024 Prepared by: Daved Bull  Exercises - Sidelying Hip Abduction wtih Flexion and Extension (BKA)  - 1 x daily - 7 x weekly - 3 sets - 10 reps - Sidelying Hip Abduction (AKA)  - 1 x daily - 7 x weekly - 3 sets - 10 reps - Sidelying Hip Circles (AKA)  - 1 x daily - 7 x weekly - 3 sets - 10 reps - Supine Psoas Stretch (AKA)  - 1 x daily - 7 x weekly - 3 sets - 10 reps  AT [facility] FIND YOUR MIDLINE POSITION AND PLACE FEET EQUAL DISTANCE FROM THE MIDLINE.  Try to find this position when standing still for activities.    USE TAPE ON FLOOR TO MARK THE MIDLINE POSITION. You also should try to feel with your limb pressure in socket.  You are trying to feel with limb what you used to feel with the bottom of your foot.   Side to Side Shift: Moving your hips only (not shoulders): move weight onto your left leg,  HOLD/FEEL.  Move back to equal weight on each leg, HOLD/FEEL. Move weight onto your right leg, HOLD/FEEL. Move back to equal weight on each leg, HOLD/FEEL. Repeat.  Start with both hands on sink, progress to right hand only, then no hands.  Front to Back Shift: Moving your hips only (not shoulders): move your weight forward onto your toes, HOLD/FEEL. Move your weight back to equal Flat Foot on both legs, HOLD/FEEL. Move your weight back onto your heels, HOLD/FEEL. Move your weight back to equal on both legs, HOLD/FEEL. Repeat.   tart with both hands on sink, progress to right hand only, then no hands. Moving Cones / Cups: With equal weight on each leg: Hold on with one hand the first time, then progress to no hand supports. Move cups from one side of sink to the other. Place cups ~2" out of your reach, progress to 10" beyond reach.  Place one hand in middle of sink and reach with other hand. Do both arms.  Then hover one hand and move cups with other hand. -just use a crossbody reach to end of safe weight shift ROM Overhead/Upward Reaching: alternated reaching up to top cabinets or ceiling if no cabinets present. Keep equal weight on each leg. Start with one hand support on counter while other hand reaches and progress to no hand support with reaching.  ace one hand in middle of sink and reach with other hand. Do both arms.  Then hover one hand and move cups with other hand.  5.   Looking Over Shoulders: With equal weight on each leg: alternate turning to look over your shoulders with one hand support on counter as needed.  Start with head motions only to look in front of shoulder, then even with shoulder and progress to looking behind you. To look to side, move head /eyes, then shoulder on side looking pulls back, shift more weight to side looking and pull hip back. ace one hand in middle of sink and let go with other hand so your shoulder can pull back. Switch hands to look other way.   Then hover one  hand and  move cups with other hand.  6.  Stepping with leg that is not amputated:  Move items under cabinet out of your way. Shift your hips/pelvis so weight on prosthesis. SLOWLY step other leg so front of foot is in cabinet. Then step back to floor. - just hike the hip and place the foot back in the starting spot  ASSESSMENT:  CLINICAL IMPRESSION: Emphasis of skilled PT session on facilitation of L knee flexion w/swing phase, step-through gait pattern, obstacle and stair navigation. Pt demonstrated significant step-to pattern when ambulating into clinic and relies on circumduction of LLE to clear L foot w/gait. Reviewed stair navigation technique w/pt and noted pt leaning backwards w/descent and compensating w/truncal flexion, placing flexion bias on L knee in single leg stance. Cued pt for upright posture to bias knee extension w/descent and pt able to perform w/increased stability but could benefit from continued practice. Noted excessive IR and ER of LLE this date, which pt reports is baseline for him due to his socket being too large. Remainder of session spent facilitating step-through gait pattern w/emphasis on L knee flexion to assist in clearing obstacles and for more efficient gait pattern. Pt able to perform well in // bars but had poor carryover when given his RW back at end of session, so recommended pt continue to practice this at home. Did not assess goals for 20th visit PN as pt will DC next session and goals will be assessed at that visit. Continue POC.   OBJECTIVE IMPAIRMENTS: Abnormal gait, decreased activity tolerance, decreased balance, decreased endurance, decreased knowledge of use of DME, decreased mobility, difficulty walking, decreased strength, decreased safety awareness, impaired vision/preception, improper body mechanics, postural dysfunction, and prosthetic dependency .   ACTIVITY LIMITATIONS: carrying, lifting, bending, standing, squatting, stairs, transfers, and locomotion  level  PARTICIPATION LIMITATIONS: community activity, occupation, and yard work  PERSONAL FACTORS: Past/current experiences, Time since onset of injury/illness/exacerbation, and 3+ comorbidities: lacunar infarction, HTN, diabetic retinopathy are also affecting patient's functional outcome.   REHAB POTENTIAL: Good  CLINICAL DECISION MAKING: Evolving/moderate complexity  EVALUATION COMPLEXITY: Moderate   GOALS: Goals reviewed with patient? Yes  SHORT TERM GOALS: Target date: 06/06/2024  Pt will be independent and compliant with introductory prosthetic management, strengthening and standing balance HEP in order to maintain functional progress and improve mobility. Baseline:  Ind and compliant per report 7/1 Goal status: MET  2.  Pt will be independent in sock ply adjustment in order to improve wear tolerance and prosthetic fit. Baseline: pt still unsure of ply he wears at baseline due to wear of socks, has not had much need for adjustment but understands he should add if socket is loose (7/1) Goal status: PARTIALLY MET  3.  Pt will decrease 5xSTS to </=12 seconds w/ no more than single UE support and minimum momentum to initiate transfer in order to demonstrate decreased risk for falls and improved functional bilateral LE strength and power. Baseline:  12.75 sec w/ BUE and moderate use of momentum to initiate stand (6/3); 11.47 sec w/ BUE, most momentum use on initial 2 stands (7/1) Goal status: PARTIALLY MET  4.  Pt will demonstrate a gait speed of >/=1.54 feet/sec in order to decrease risk for falls. Baseline: 1.34 ft/sec w/ 2WW SBA (6/3); 1.25 ft/sec w/ 2WW mod I (7/1) Goal status: NOT MET  5.  Pt will improve AMPPRO to >/=32/47 in order to demonstrate improved functional capacity on current prosthetic componentry. Baseline: 27/47 (6/3); 30/47 (7/1) Goal  status: IN PROGRESS  LONG TERM GOALS: Target date: 07/04/2024  Pt will be independent and compliant with advanced prosthetic  management, strengthening and standing balance HEP in order to maintain functional progress and improve mobility. Baseline: Reports ind and compliance (7/29) Goal status: MET  2.   5xSTS to be assessed w/ LTG set as appropriate. Baseline: Assessed 6/3 - no LTG needed at this time. Goal status: REVISED - D/C'd  3.  Pt will demonstrate a gait speed of >/=1.74 feet/sec in order to decrease risk for falls. Baseline: 1.34 ft/sec w/ 2WW SBA (6/3); 1.38 ft/sec (7/29) Goal status: NOT MET  4.  Pt will improve AMPPRO to >/=37/47 in order to demonstrate improved functional capacity on current prosthetic componentry. Baseline: 27/47 (6/3); 38/47 (7/29) Goal status: MET  5.  Pt will navigate 4 stairs using step-to pattern and bilateral rails at mod I level to demonstrate safety in home environment. Baseline: Pt reports ind at home; mod I x4 stairs step to (7/29) Goal status:  MET  6.  Pt will initiate ambulation in clinic >/=50 ft with least restrictive cane option in order to demonstrate improved upright stability and prosthetic management. Baseline: Pt attempts a few feet with cane in home only; [facility] x115' at [facility] (7/29) Goal status: MET  GOALS (at 2/70 re-cert): Goals reviewed with patient? Yes  SHORT TERM GOALS: Target date: 08/08/2024  1.  Pt will be independent in sock ply adjustment in order to improve wear tolerance and prosthetic fit. Baseline: pt waiting to obtain new socks (7/29) Goal status: INITIAL  2.  Pt will demonstrate a gait speed of >/=1.54 feet/sec in order to decrease risk for falls. Baseline: 1.38 ft/sec (7/29) Goal status: INITIAL  3.  Pt will perform curb step using least restrictive cane option at no more than SBA level in order to improve community obstacle management. Baseline: CGA-minA Goal status: INITIAL  LONG TERM GOALS: Target date: 09/16/2024 (updated to match last appointment date)   1.  Pt will demonstrate a gait speed of >/=1.74 feet/sec in order to  decrease risk for falls. Baseline: 1.38 ft/sec (7/29) Goal status:  INITIAL  2.  Pt will ambulate up and down ramp w/ SPC at no more than SBA in order to demonstrate improved community management. Baseline: CGA-minA Goal status: INITIAL  3.  Pt will navigate 4 stairs using step-to pattern unilateral rail and cane as needed at mod I level to demonstrate safety in home and community environments. Baseline: mod I x4 stairs step to bil rails (7/29) Goal status:  INITIAL  4.  Pt will ambulate >/=500 feet over level and unlevel ground with least restrictive cane option and no more than SBA level of assist to promote household and community access. Baseline: SPC x115' at [facility] level surface only (7/29) Goal status: INITIAL  PLAN:  PT FREQUENCY: 2x/week + 1x/wk  PT DURATION: 8 weeks + 8 weeks  PLANNED INTERVENTIONS: [id number]- PT Re-evaluation, 97750- Physical Performance Testing, 97110-Therapeutic exercises, 97530- Therapeutic activity, [id number]- Neuromuscular re-education, 97535- Self Care, [id number]- Manual therapy, [id number]- Gait training, [phone]- Prosthetic Initial , [phone]- Electrical stimulation (manual), Patient/Family education, Balance training, Stair training, Vestibular training, and DME instructions  PLAN FOR NEXT SESSION:  Check BP! Goals and DC!  Kadee Philyaw E Allycia Pitz, PT, DPT  09/08/2024, 4:14 PM

## 2024-09-10 ENCOUNTER — Encounter: Admitting: Physical Therapy

## 2024-09-16 ENCOUNTER — Encounter: Payer: Self-pay | Admitting: Physical Therapy

## 2024-09-16 ENCOUNTER — Ambulatory Visit: Payer: Self-pay | Attending: Cardiology | Admitting: Physical Therapy

## 2024-09-16 VITALS — BP 120/99 | HR 85

## 2024-09-16 DIAGNOSIS — M6281 Muscle weakness (generalized): Secondary | ICD-10-CM | POA: Insufficient documentation

## 2024-09-16 DIAGNOSIS — R269 Unspecified abnormalities of gait and mobility: Secondary | ICD-10-CM | POA: Insufficient documentation

## 2024-09-16 DIAGNOSIS — R2689 Other abnormalities of gait and mobility: Secondary | ICD-10-CM | POA: Diagnosis present

## 2024-09-16 DIAGNOSIS — R2681 Unsteadiness on feet: Secondary | ICD-10-CM | POA: Diagnosis present

## 2024-09-16 NOTE — Therapy (Signed)
 OUTPATIENT PHYSICAL THERAPY PROSTHETICS TREATMENT - DISCHARGE SUMMARY/20th Visit PN   Patient Name: Jeremy Padilla MRN: [id number] DOB:May 26, 1966, 58 y.o., male Today's Date: 09/16/2024  PCP: Carin Gauze, NP REFERRING PROVIDER: Carin Gauze, NP  PT progress note for Jeremy Padilla.  Reporting period 06/24/2024 to 09/16/2024  See Note below for Objective Data and Assessment of Progress/Goals  Thank you for the referral of this patient. Daved Bull, PT, DPT   PHYSICAL THERAPY DISCHARGE SUMMARY  Visits from Start of Care: 21  Current functional level related to goals / functional outcomes: See clinical impression statement.   Remaining deficits: Difficulty maintaining step through pattern and managing obstacles   Education / Equipment: Continue working on step-through pattern at home w/RW, discharge plan for today and process for returning evaluation w/ new referral once new prosthetic obtained.  Progress towards goals and continued practice w/ cane at home.  Use 2WW as needed for safety.   Patient agrees to discharge. Patient goals were partially met. Patient is being discharged due to maximized rehab potential.    END OF SESSION:  PT End of Session - 09/16/24 1453     Visit Number 21    Number of Visits 25   17 + 8   Date for Recertification  09/19/24   pushed out due to scheduling delay   Authorization Type Medicare A & B with secondary Medicaid    Progress Note Due on Visit 20    PT Start Time 1449    PT Stop Time 1533    PT Time Calculation (min) 44 min    Equipment Utilized During Treatment Gait belt    Activity Tolerance Patient tolerated treatment well    Behavior During Therapy WFL for tasks assessed/performed            Past Medical History:  Diagnosis Date   Abscess 10/05/2015   Diabetes mellitus without complication (HCC)    Heart murmur    Herpes exposure    Hyperlipidemia    Hypertension    Past Surgical History:   Procedure Laterality Date   AMPUTATION Left 06/23/2020   Procedure: AMPUTATION ABOVE KNEE;  Surgeon: Marea Selinda RAMAN, MD;  Location: [facility];  Service: General;  Laterality: Left;   LOWER EXTREMITY ANGIOGRAPHY Left 04/09/2019   Procedure: LOWER EXTREMITY ANGIOGRAPHY;  Surgeon: Marea Selinda RAMAN, MD;  Location: [facility];  Service: Cardiovascular;  Laterality: Left;   LOWER EXTREMITY ANGIOGRAPHY Left 04/19/2020   Procedure: Lower Extremity Angiography;  Surgeon: Marea Selinda RAMAN, MD;  Location: [facility];  Service: Cardiovascular;  Laterality: Left;   LOWER EXTREMITY ANGIOGRAPHY Left 04/20/2020   Procedure: Lower Extremity Angiography;  Surgeon: Marea Selinda RAMAN, MD;  Location: [facility];  Service: Cardiovascular;  Laterality: Left;   LOWER EXTREMITY INTERVENTION N/A 05/30/2019   Procedure: LOWER EXTREMITY INTERVENTION;  Surgeon: Jama Cordella MATSU, MD;  Location: [facility];  Service: Cardiovascular;  Laterality: N/A;   PENILE PROSTHESIS IMPLANT N/A 02/05/2024   Procedure: INSERTION OF INFLATABLE PENILE PROSTHESIS;  Surgeon: Lovie Arlyss CROME, MD;  Location: [facility];  Service: Urology;  Laterality: N/A;  135 MINUTES NEEDED FOR CASE   WRIST SURGERY Left    Patient Active Problem List   Diagnosis Date Noted   Skin abrasion 01/29/2024   Edema of toe 08/20/2023   Toe infection 08/20/2023   Diabetic polyneuropathy associated with diabetes mellitus due to underlying condition (HCC) 05/18/2023   Benign mole 03/20/2023   Mixed hyperlipidemia  03/20/2023   Hx of AKA (above knee amputation), left (HCC) 09/10/2020   Protein-calorie malnutrition, severe 06/23/2020   Pressure injury of skin 06/22/2020   BKA stump complication (HCC) 06/21/2020   Hyponatremia 06/21/2020   Dehydration 06/21/2020   Ischemic leg 04/20/2020   Ischemia 04/16/2020   Ischemia of left lower extremity 05/30/2019   PAD (peripheral artery disease) 04/08/2019   Lacunar infarction (HCC) 08/03/2017   Bruit  of left carotid artery 08/03/2017   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) 08/02/2017   Abscess or cellulitis, neck 10/08/2015   Hyperglycemia 10/08/2015   Hypertension goal BP (blood pressure) < 140/90 10/08/2015   Heart murmur 10/08/2015    ONSET DATE: July 2021 (initial amputation)  REFERRING DIAG: S10.387 (ICD-10-CM) - Hx of AKA (above knee amputation), left (HCC) I73.9 (ICD-10-CM) - PAD (peripheral artery disease) (HCC)  THERAPY DIAG:  Other abnormalities of gait and mobility  Unsteadiness on feet  Muscle weakness (generalized)  Abnormality of gait and mobility  Rationale for Evaluation and Treatment: Rehabilitation  SUBJECTIVE:   SUBJECTIVE STATEMENT: Pt presents w/RW, holding SPC. Denies falls or acute changes.  He is unsure of ply but states the same 3 socks he usually wears.   Pt accompanied by: self (drives himself)  PERTINENT HISTORY: HTN (goal <140/90), DM2, lacunar infarction, PAD, heart murmur, diabetic retinopathy (treated with shots)  PAIN:  Are you having pain? 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   PATIENT GOALS: to walk better and maybe get a better prosthetic, to be more independent  VITALS  Vitals:   09/16/24 1456  BP: (!) 120/99  Pulse: 85                                                                                                                               TREATMENT Self-care/home management  Assessed vitals in LUE (see above) and systolic BP elevated but within limits for session  Ther Act   Curb step SBA w/ SPC - increased time to recall sequencing for descent w/ SPC SBA:  28.29 sec = 0.35 m/sec OR 1.17 ft/sec w/ 2WW mod I:  24.50 sec = 0.41 m/sec OR 1.35 ft/sec Ramp x1 SBA w/ SPC using narrowed step through pattern 4x6 steps using rail and SPC mod I w/ step to pattern Pt ambulates w/ SPC 500' over outdoor sidewalk and grass  managing obstacles SBA, x2 lateral waver w/ increased time to recover using stepping strategy and weight shifting, edu on various ways to manage obstacles as pt prefers prosthetic leading w/ min difficulty positioning for safe step over  PATIENT EDUCATION: Education details: Continue working on step-through pattern at home w/RW, discharge plan for today and process for returning evaluation w/ new referral once new prosthetic obtained.  Progress towards goals and continued practice w/ cane at home.  Use 2WW as needed for safety. Person educated: Patient  Education method: Explanation, Demonstration, and Verbal cues Education comprehension: verbalized understanding, verbal cues required, and needs further education  HOME EXERCISE PROGRAM: Access Code: [email or web address] URL: [email or web address] Date: 05/09/2024 Prepared by: Daved Bull  Exercises - Sidelying Hip Abduction wtih Flexion and Extension (BKA)  - 1 x daily - 7 x weekly - 3 sets - 10 reps - Sidelying Hip Abduction (AKA)  - 1 x daily - 7 x weekly - 3 sets - 10 reps - Sidelying Hip Circles (AKA)  - 1 x daily - 7 x weekly - 3 sets - 10 reps - Supine Psoas Stretch (AKA)  - 1 x daily - 7 x weekly - 3 sets - 10 reps  AT [facility] FIND YOUR MIDLINE POSITION AND PLACE FEET EQUAL DISTANCE FROM THE MIDLINE.  Try to find this position when standing still for activities.    USE TAPE ON FLOOR TO MARK THE MIDLINE POSITION. You also should try to feel with your limb pressure in socket.  You are trying to feel with limb what you used to feel with the bottom of your foot.   Side to Side Shift: Moving your hips only (not shoulders): move weight onto your left leg, HOLD/FEEL.  Move back to equal weight on each leg, HOLD/FEEL. Move weight onto your right leg, HOLD/FEEL. Move back to equal weight on each leg, HOLD/FEEL. Repeat.  Start with both hands on sink, progress to right hand only, then no hands.  Front to Back Shift: Moving your hips only  (not shoulders): move your weight forward onto your toes, HOLD/FEEL. Move your weight back to equal Flat Foot on both legs, HOLD/FEEL. Move your weight back onto your heels, HOLD/FEEL. Move your weight back to equal on both legs, HOLD/FEEL. Repeat.   tart with both hands on sink, progress to right hand only, then no hands. Moving Cones / Cups: With equal weight on each leg: Hold on with one hand the first time, then progress to no hand supports. Move cups from one side of sink to the other. Place cups ~2" out of your reach, progress to 10" beyond reach.  Place one hand in middle of sink and reach with other hand. Do both arms.  Then hover one hand and move cups with other hand. -just use a crossbody reach to end of safe weight shift ROM Overhead/Upward Reaching: alternated reaching up to top cabinets or ceiling if no cabinets present. Keep equal weight on each leg. Start with one hand support on counter while other hand reaches and progress to no hand support with reaching.  ace one hand in middle of sink and reach with other hand. Do both arms.  Then hover one hand and move cups with other hand.  5.   Looking Over Shoulders: With equal weight on each leg: alternate turning to look over your shoulders with one hand support on counter as needed.  Start with head motions only to look in front of shoulder, then even with shoulder and progress to looking behind you. To look to side, move head /eyes, then shoulder on side looking pulls back, shift more weight to side looking and pull hip back. ace one hand in middle of sink and let go with other hand so your shoulder can pull back. Switch hands to look other way.   Then hover one hand and move cups with other hand.  6.  Stepping with leg that is not amputated:  Move items under cabinet out of your way. Shift your hips/pelvis  so weight on prosthesis. SLOWLY step other leg so front of foot is in cabinet. Then step back to floor. - just hike the hip and place the foot  back in the starting spot  ASSESSMENT:  CLINICAL IMPRESSION: Focus of skilled PT session today on assessing all goals as STG assessment missed due to timeframe constraints.  He has leveled out in gait speed with better speed demonstrated using 2WW.  He continues to use primarily step to pattern for most ambulatory tasks and is able to maintain swing through vs circumduction if cued to slow down as he prioritizes efficiency over quality of gait.  He is able to manage curb, ramp, and stairs using cane at no more than SBA level.  He is due to receive new prosthetic componentry at end of November and would like benefit from return assessment to ensure safe functional use of device.  Will reassess at that time.    OBJECTIVE IMPAIRMENTS: Abnormal gait, decreased activity tolerance, decreased balance, decreased endurance, decreased knowledge of use of DME, decreased mobility, difficulty walking, decreased strength, decreased safety awareness, impaired vision/preception, improper body mechanics, postural dysfunction, and prosthetic dependency .   ACTIVITY LIMITATIONS: carrying, lifting, bending, standing, squatting, stairs, transfers, and locomotion level  PARTICIPATION LIMITATIONS: community activity, occupation, and yard work  PERSONAL FACTORS: Past/current experiences, Time since onset of injury/illness/exacerbation, and 3+ comorbidities: lacunar infarction, HTN, diabetic retinopathy are also affecting patient's functional outcome.   REHAB POTENTIAL: Good  CLINICAL DECISION MAKING: Evolving/moderate complexity  EVALUATION COMPLEXITY: Moderate   GOALS: Goals reviewed with patient? Yes  SHORT TERM GOALS: Target date: 06/06/2024  Pt will be independent and compliant with introductory prosthetic management, strengthening and standing balance HEP in order to maintain functional progress and improve mobility. Baseline:  Ind and compliant per report 7/1 Goal status: MET  2.  Pt will be independent  in sock ply adjustment in order to improve wear tolerance and prosthetic fit. Baseline: pt still unsure of ply he wears at baseline due to wear of socks, has not had much need for adjustment but understands he should add if socket is loose (7/1) Goal status: PARTIALLY MET  3.  Pt will decrease 5xSTS to </=12 seconds w/ no more than single UE support and minimum momentum to initiate transfer in order to demonstrate decreased risk for falls and improved functional bilateral LE strength and power. Baseline:  12.75 sec w/ BUE and moderate use of momentum to initiate stand (6/3); 11.47 sec w/ BUE, most momentum use on initial 2 stands (7/1) Goal status: PARTIALLY MET  4.  Pt will demonstrate a gait speed of >/=1.54 feet/sec in order to decrease risk for falls. Baseline: 1.34 ft/sec w/ 2WW SBA (6/3); 1.25 ft/sec w/ 2WW mod I (7/1) Goal status: NOT MET  5.  Pt will improve AMPPRO to >/=32/47 in order to demonstrate improved functional capacity on current prosthetic componentry. Baseline: 27/47 (6/3); 30/47 (7/1) Goal status: IN PROGRESS  LONG TERM GOALS: Target date: 07/04/2024  Pt will be independent and compliant with advanced prosthetic management, strengthening and standing balance HEP in order to maintain functional progress and improve mobility. Baseline: Reports ind and compliance (7/29) Goal status: MET  2.   5xSTS to be assessed w/ LTG set as appropriate. Baseline: Assessed 6/3 - no LTG needed at this time. Goal status: REVISED - D/C'd  3.  Pt will demonstrate a gait speed of >/=1.74 feet/sec in order to decrease risk for falls. Baseline: 1.34 ft/sec w/ 2WW SBA (6/3); 1.38  ft/sec (7/29) Goal status: NOT MET  4.  Pt will improve AMPPRO to >/=37/47 in order to demonstrate improved functional capacity on current prosthetic componentry. Baseline: 27/47 (6/3); 38/47 (7/29) Goal status: MET  5.  Pt will navigate 4 stairs using step-to pattern and bilateral rails at mod I level to  demonstrate safety in home environment. Baseline: Pt reports ind at home; mod I x4 stairs step to (7/29) Goal status:  MET  6.  Pt will initiate ambulation in clinic >/=50 ft with least restrictive cane option in order to demonstrate improved upright stability and prosthetic management. Baseline: Pt attempts a few feet with cane in home only; [facility] x115' at [facility] (7/29) Goal status: MET  GOALS (at 2/70 re-cert): Goals reviewed with patient? Yes  SHORT TERM GOALS: Target date: 08/08/2024  1.  Pt will be independent in sock ply adjustment in order to improve wear tolerance and prosthetic fit. Baseline: pt waiting to obtain new socks (7/29); will get new socks w/ new prosthetic (10/7) Goal status: NOT MET  2.  Pt will demonstrate a gait speed of >/=1.54 feet/sec in order to decrease risk for falls. Baseline: 1.38 ft/sec (7/29); 1.35 ft/sec (10/7) Goal status: NOT MET  3.  Pt will perform curb step using least restrictive cane option at no more than SBA level in order to improve community obstacle management. Baseline: CGA-minA; SBA SPC (10/7) Goal status: MET  LONG TERM GOALS: Target date: 09/16/2024 (updated to match last appointment date)   1.  Pt will demonstrate a gait speed of >/=1.74 feet/sec in order to decrease risk for falls. Baseline: 1.38 ft/sec (7/29); 1.35 ft/sec (10/7) Goal status:  NOT MET  2.  Pt will ambulate up and down ramp w/ SPC at no more than SBA in order to demonstrate improved community management. Baseline: CGA-minA; SBA SPC (10/7) Goal status: MET  3.  Pt will navigate 4 stairs using step-to pattern unilateral rail and cane as needed at mod I level to demonstrate safety in home and community environments. Baseline: mod I x4 stairs step to bil rails (7/29); mod I step to w/ rail and SPC (10/7) Goal status:  MET  4.  Pt will ambulate >/=500 feet over level and unlevel ground with least restrictive cane option and no more than SBA level of assist to promote  household and community access. Baseline: SPC x115' at [facility] level surface only (7/29) 500' over outdoor sidewalk and grass managing obstacles SBA w/ SPC (10/7) Goal status: MET  PLAN:  PT FREQUENCY: 2x/week + 1x/wk  PT DURATION: 8 weeks + 8 weeks  PLANNED INTERVENTIONS: [id number]- PT Re-evaluation, 97750- Physical Performance Testing, 97110-Therapeutic exercises, 97530- Therapeutic activity, [id number]- Neuromuscular re-education, 97535- Self Care, [id number]- Manual therapy, [id number]- Gait training, [phone]- Prosthetic Initial , [phone]- Electrical stimulation (manual), Patient/Family education, Balance training, Stair training, Vestibular training, and DME instructions  PLAN FOR NEXT SESSION: N/A  Daved KATHEE Bull, PT, DPT  09/16/2024, 4:16 PM

## 2024-09-17 ENCOUNTER — Other Ambulatory Visit: Payer: Self-pay | Admitting: Cardiology

## 2024-09-17 ENCOUNTER — Telehealth: Payer: Self-pay | Admitting: Cardiology

## 2024-09-17 MED ORDER — PREDNISONE 20 MG PO TABS: ORAL_TABLET | ORAL | 0 refills | Status: DC

## 2024-09-17 NOTE — Telephone Encounter (Signed)
 Patient left VM stating he is covered in poison oak and wants to know if he needs to be seen or if Amber can send him in something for the poison oak. Please advise.

## 2024-09-17 NOTE — Telephone Encounter (Signed)
 PT called stating he has poison oak again on his hands, face & arms. Place him on the schedule at his availability, 09-18-24 @9am .  But pt was wondering if since he's had this before could you call him something in or would he need to still be seen? Thanks!

## 2024-09-18 ENCOUNTER — Ambulatory Visit: Admitting: Cardiology

## 2024-10-06 ENCOUNTER — Ambulatory Visit (INDEPENDENT_AMBULATORY_CARE_PROVIDER_SITE_OTHER): Admitting: Cardiology

## 2024-10-06 DIAGNOSIS — L258 Unspecified contact dermatitis due to other agents: Secondary | ICD-10-CM

## 2024-10-06 MED ORDER — METHYLPREDNISOLONE ACETATE 40 MG/ML IJ SUSP
40.0000 mg | Freq: Once | INTRAMUSCULAR | Status: AC
  Administered 2024-10-06: 80 mg via INTRAMUSCULAR

## 2024-10-24 ENCOUNTER — Ambulatory Visit (INDEPENDENT_AMBULATORY_CARE_PROVIDER_SITE_OTHER): Admitting: Cardiology

## 2024-10-24 ENCOUNTER — Encounter: Payer: Self-pay | Admitting: Cardiology

## 2024-10-24 VITALS — BP 160/84 | HR 76 | Ht 74.0 in | Wt 142.2 lb

## 2024-10-24 DIAGNOSIS — Z23 Encounter for immunization: Secondary | ICD-10-CM

## 2024-10-24 DIAGNOSIS — R269 Unspecified abnormalities of gait and mobility: Secondary | ICD-10-CM

## 2024-10-24 DIAGNOSIS — Z89612 Acquired absence of left leg above knee: Secondary | ICD-10-CM

## 2024-10-24 DIAGNOSIS — R2681 Unsteadiness on feet: Secondary | ICD-10-CM | POA: Diagnosis not present

## 2024-10-24 DIAGNOSIS — T879 Unspecified complications of amputation stump: Secondary | ICD-10-CM | POA: Diagnosis not present

## 2024-10-24 NOTE — Progress Notes (Signed)
 Established Patient Office Visit  Subjective:  Patient ID: Jeremy Padilla, male    DOB: 03-25-1966  Age: 58 y.o. MRN: [id number]  Chief Complaint  Patient presents with   Follow-up    Pt referral due to new prosthetic    Patient in office for a referral to physical therapy. Patient recently received a new prosthetic, states he is working his leg differently now. Referral sent.  Patient scheduled for regular follow up in December. Will get lab work prior to that visit.  Blood pressure elevated today due to pain.  Patient requesting a flu vaccine today.    No other concerns at this time.   Past Medical History:  Diagnosis Date   Abscess 10/05/2015   Diabetes mellitus without complication (HCC)    Heart murmur    Herpes exposure    Hyperlipidemia    Hypertension     Past Surgical History:  Procedure Laterality Date   AMPUTATION Left 06/23/2020   Procedure: AMPUTATION ABOVE KNEE;  Surgeon: Marea Selinda RAMAN, MD;  Location: [facility];  Service: General;  Laterality: Left;   LOWER EXTREMITY ANGIOGRAPHY Left 04/09/2019   Procedure: LOWER EXTREMITY ANGIOGRAPHY;  Surgeon: Marea Selinda RAMAN, MD;  Location: [facility];  Service: Cardiovascular;  Laterality: Left;   LOWER EXTREMITY ANGIOGRAPHY Left 04/19/2020   Procedure: Lower Extremity Angiography;  Surgeon: Marea Selinda RAMAN, MD;  Location: [facility];  Service: Cardiovascular;  Laterality: Left;   LOWER EXTREMITY ANGIOGRAPHY Left 04/20/2020   Procedure: Lower Extremity Angiography;  Surgeon: Marea Selinda RAMAN, MD;  Location: [facility];  Service: Cardiovascular;  Laterality: Left;   LOWER EXTREMITY INTERVENTION N/A 05/30/2019   Procedure: LOWER EXTREMITY INTERVENTION;  Surgeon: Jama Cordella MATSU, MD;  Location: [facility];  Service: Cardiovascular;  Laterality: N/A;   PENILE PROSTHESIS IMPLANT N/A 02/05/2024   Procedure: INSERTION OF INFLATABLE PENILE PROSTHESIS;  Surgeon: Lovie Arlyss CROME, MD;  Location: [facility];  Service: Urology;  Laterality: N/A;  135 MINUTES NEEDED FOR CASE   WRIST SURGERY Left     Social History   Socioeconomic History   Marital status: Widowed    Spouse name: Not on file   Number of children: Not on file   Years of education: Not on file   Highest education level: Not on file  Occupational History   Occupation: applied for disability  Tobacco Use   Smoking status: Never    Passive exposure: Never   Smokeless tobacco: Never  Vaping Use   Vaping status: Never Used  Substance and Sexual Activity   Alcohol use: Yes    Alcohol/week: 1.0 standard drink of alcohol    Types: 1 Glasses of wine per week    Comment: 1 occasionally   Drug use: No   Sexual activity: Yes  Other Topics Concern   Not on file  Social History Narrative   Wife just passed away 29-Apr-2009   Social Drivers of Health   Financial Resource Strain: Low Risk (06/01/2020)   Received [facility]   Overall Financial Resource Strain (CARDIA)    Difficulty of Paying Living Expenses: Not hard at all  Food Insecurity: No Food Insecurity (06/01/2020)   [facility]   Hunger Vital Sign    Within the past 12 months, you worried that your food would run out before you got the money to buy more.: Never true    Within the past 12 months, the food you  bought just didn't last and you didn't have money to get more.: Never true  Transportation Needs: No Transportation Needs (06/01/2020)   Received from [facility]    Lack of Transportation (Medical): No    Lack of Transportation (Non-Medical): No  Physical Activity: Not on file  Stress: Not on file  Social Connections: Not on file  Intimate Partner Violence: Not on file    Family History  Problem Relation Age of Onset   Heart disease Father    Hyperlipidemia Father    Hypertension Father     Allergies  Allergen Reactions   Penicillins Rash    .Has patient had a PCN reaction causing immediate  rash, facial/tongue/throat swelling, SOB or lightheadedness with hypotension: Unknown Has patient had a PCN reaction causing severe rash involving mucus membranes or skin necrosis: Unknown Has patient had a PCN reaction that required hospitalization: Unknown Has patient had a PCN reaction occurring within the last 10 years: Unknown If all of the above answers are NO, then may proceed with Cephalosporin use.  Patient Tolerates Cephalosporins    Statins Other (See Comments)    Outpatient Medications Prior to Visit  Medication Sig   aspirin  EC 81 MG tablet Take 81 mg by mouth daily.   cetirizine  (ZYRTEC  ALLERGY) 10 MG tablet Take 1 tablet (10 mg total) by mouth daily.   Cholecalciferol (VITAMIN D-3) 125 MCG (5000 UT) TABS Take 5,000 Units by mouth daily.   glipiZIDE  (GLUCOTROL  XL) 5 MG 24 hr tablet Take 1 tablet (5 mg total) by mouth daily with breakfast.   lisinopril  (ZESTRIL ) 40 MG tablet Take 1 tablet (40 mg total) by mouth daily.   Magnesium  250 MG TABS Take 250 mg by mouth daily.   pravastatin  (PRAVACHOL ) 10 MG tablet Take 1 tablet (10 mg total) by mouth daily.   [DISCONTINUED] predniSONE  (DELTASONE ) 20 MG tablet Take two tablets daily for 5 days (Patient not taking: Reported on 10/24/2024)   No facility-administered medications prior to visit.    Review of Systems  Constitutional: Negative.   HENT: Negative.    Eyes: Negative.   Respiratory: Negative.  Negative for shortness of breath.   Cardiovascular: Negative.  Negative for chest pain.  Gastrointestinal: Negative.  Negative for abdominal pain, constipation and diarrhea.  Genitourinary: Negative.   Musculoskeletal:  Negative for joint pain and myalgias.       Left leg AK muscle pain  Skin: Negative.   Neurological: Negative.  Negative for dizziness and headaches.  Endo/Heme/Allergies: Negative.   All other systems reviewed and are negative.      Objective:   BP (!) 160/84   Pulse 76   Ht 6' 2 (1.88 m)   Wt 142  lb 3.2 oz (64.5 kg)   SpO2 97%   BMI 18.26 kg/m   Vitals:   10/24/24 1017  BP: (!) 160/84  Pulse: 76  Height: 6' 2 (1.88 m)  Weight: 142 lb 3.2 oz (64.5 kg)  SpO2: 97%  BMI (Calculated): 18.25    Physical Exam Nursing note reviewed.  Constitutional:      Appearance: Normal appearance. He is normal weight.  HENT:     Head: Normocephalic and atraumatic.     Nose: Nose normal.     Mouth/Throat:     Mouth: Mucous membranes are moist.     Pharynx: Oropharynx is clear.  Eyes:     Extraocular Movements: Extraocular movements intact.     Conjunctiva/sclera: Conjunctivae normal.  Pupils: Pupils are equal, round, and reactive to light.  Cardiovascular:     Rate and Rhythm: Normal rate and regular rhythm.     Pulses: Normal pulses.     Heart sounds: Normal heart sounds.  Pulmonary:     Effort: Pulmonary effort is normal.     Breath sounds: Normal breath sounds.  Abdominal:     General: Abdomen is flat. Bowel sounds are normal.     Palpations: Abdomen is soft.  Musculoskeletal:        General: Normal range of motion.     Cervical back: Normal range of motion.  Skin:    General: Skin is warm and dry.  Neurological:     General: No focal deficit present.     Mental Status: He is alert and oriented to person, place, and time.  Psychiatric:        Mood and Affect: Mood normal.        Behavior: Behavior normal.        Thought Content: Thought content normal.        Judgment: Judgment normal.      No results found for any visits on 10/24/24.  No results found for this or any previous visit (from the past 2160 hours).    Assessment & Plan:  Referral sent to PT Flu vaccine today Blood work prior to next visit  Problem List Items Addressed This Visit       Other   BKA stump complication (HCC)   Hx of AKA (above knee amputation), left (HCC) - Primary   Relevant Orders   Ambulatory referral to Physical Therapy   Other Visit Diagnoses       Flu vaccine need        Relevant Orders   Influenza, MDCK, trivalent, PF(Flucelvax egg-free) (Completed)       Return if symptoms worsen or fail to improve, for as scheduled in December.   Total time spent: 25 minutes. This time includes review of previous notes and results and patient face to face interaction during today's visit.    Jeoffrey Pollen, NP  10/24/2024   This document may have been prepared by Dragon Voice Recognition software and as such may include unintentional dictation errors.

## 2024-10-30 ENCOUNTER — Other Ambulatory Visit

## 2024-10-30 ENCOUNTER — Other Ambulatory Visit: Payer: Self-pay

## 2024-10-30 DIAGNOSIS — E0842 Diabetes mellitus due to underlying condition with diabetic polyneuropathy: Secondary | ICD-10-CM

## 2024-10-30 DIAGNOSIS — E782 Mixed hyperlipidemia: Secondary | ICD-10-CM

## 2024-10-30 DIAGNOSIS — I10 Essential (primary) hypertension: Secondary | ICD-10-CM

## 2024-10-31 ENCOUNTER — Ambulatory Visit: Payer: Self-pay | Admitting: Cardiology

## 2024-10-31 LAB — CMP14+EGFR
ALT: 13 IU/L (ref 0–44)
AST: 16 IU/L (ref 0–40)
Albumin: 4.2 g/dL (ref 3.8–4.9)
Alkaline Phosphatase: 116 IU/L (ref 47–123)
BUN/Creatinine Ratio: 29 — ABNORMAL HIGH (ref 10–24)
BUN: 29 mg/dL — ABNORMAL HIGH (ref 8–27)
Bilirubin Total: 0.4 mg/dL (ref 0.0–1.2)
CO2: 25 mmol/L (ref 20–29)
Calcium: 9.3 mg/dL (ref 8.6–10.2)
Chloride: 102 mmol/L (ref 96–106)
Creatinine, Ser: 1 mg/dL (ref 0.76–1.27)
Globulin, Total: 1.8 g/dL (ref 1.5–4.5)
Glucose: 91 mg/dL (ref 70–99)
Potassium: 4.3 mmol/L (ref 3.5–5.2)
Sodium: 142 mmol/L (ref 134–144)
Total Protein: 6 g/dL (ref 6.0–8.5)
eGFR: 86 mL/min/1.73 (ref >59)

## 2024-10-31 LAB — LIPID PANEL
Chol/HDL Ratio: 2.9 ratio (ref 0.0–5.0)
Cholesterol, Total: 166 mg/dL (ref 100–199)
HDL: 57 mg/dL (ref >39)
LDL Chol Calc (NIH): 96 mg/dL (ref 0–99)
Triglycerides: 64 mg/dL (ref 0–149)
VLDL Cholesterol Cal: 13 mg/dL (ref 5–40)

## 2024-10-31 LAB — HEMOGLOBIN A1C
Est. average glucose Bld gHb Est-mCnc: 108 mg/dL
Hgb A1c MFr Bld: 5.4 % (ref 4.8–5.6)

## 2024-10-31 LAB — TSH: TSH: 1.58 u[IU]/mL (ref 0.450–4.500)

## 2024-11-10 ENCOUNTER — Ambulatory Visit: Admitting: Cardiology

## 2024-11-10 ENCOUNTER — Encounter: Payer: Self-pay | Admitting: Cardiology

## 2024-11-10 VITALS — BP 118/78 | HR 80 | Ht 72.0 in | Wt 150.6 lb

## 2024-11-10 DIAGNOSIS — I152 Hypertension secondary to endocrine disorders: Secondary | ICD-10-CM

## 2024-11-10 DIAGNOSIS — E782 Mixed hyperlipidemia: Secondary | ICD-10-CM

## 2024-11-10 DIAGNOSIS — E0842 Diabetes mellitus due to underlying condition with diabetic polyneuropathy: Secondary | ICD-10-CM | POA: Diagnosis not present

## 2024-11-10 DIAGNOSIS — I10 Essential (primary) hypertension: Secondary | ICD-10-CM

## 2024-11-10 DIAGNOSIS — Z89612 Acquired absence of left leg above knee: Secondary | ICD-10-CM

## 2024-11-10 DIAGNOSIS — E1169 Type 2 diabetes mellitus with other specified complication: Secondary | ICD-10-CM | POA: Insufficient documentation

## 2024-11-10 DIAGNOSIS — E1159 Type 2 diabetes mellitus with other circulatory complications: Secondary | ICD-10-CM

## 2024-11-10 MED ORDER — PRAVASTATIN SODIUM 20 MG PO TABS: 20.0000 mg | ORAL_TABLET | Freq: Every day | ORAL | 1 refills | Status: AC

## 2024-11-10 NOTE — Progress Notes (Signed)
 Established Patient Office Visit  Subjective:  Patient ID: Jeremy Padilla, male    DOB: 1966/07/09  Age: 58 y.o. MRN: [id number]  Chief Complaint  Patient presents with   Results    4 month lab results     Patient in office for 4 month follow up, discuss recent lab results. Patient doing well, no new complaints.  Discussed recent lab work. Hgb A1c well controlled. LDL elevated, should be below 70. Patient tolerating pravastatin  10 mg, will increase to 20 mg daily.  Blood pressure improved on recheck.  Patient participating in physical therapy for his left leg prosthetic.  Continue current medications.     No other concerns at this time.   Past Medical History:  Diagnosis Date   Abscess 10/05/2015   Diabetes mellitus without complication (HCC)    Heart murmur    Herpes exposure    Hyperlipidemia    Hypertension     Past Surgical History:  Procedure Laterality Date   AMPUTATION Left 06/23/2020   Procedure: AMPUTATION ABOVE KNEE;  Surgeon: Marea Selinda RAMAN, MD;  Location: [facility];  Service: General;  Laterality: Left;   LOWER EXTREMITY ANGIOGRAPHY Left 04/09/2019   Procedure: LOWER EXTREMITY ANGIOGRAPHY;  Surgeon: Marea Selinda RAMAN, MD;  Location: [facility];  Service: Cardiovascular;  Laterality: Left;   LOWER EXTREMITY ANGIOGRAPHY Left 04/19/2020   Procedure: Lower Extremity Angiography;  Surgeon: Marea Selinda RAMAN, MD;  Location: [facility];  Service: Cardiovascular;  Laterality: Left;   LOWER EXTREMITY ANGIOGRAPHY Left 04/20/2020   Procedure: Lower Extremity Angiography;  Surgeon: Marea Selinda RAMAN, MD;  Location: [facility];  Service: Cardiovascular;  Laterality: Left;   LOWER EXTREMITY INTERVENTION N/A 05/30/2019   Procedure: LOWER EXTREMITY INTERVENTION;  Surgeon: Jama Cordella MATSU, MD;  Location: [facility];  Service: Cardiovascular;  Laterality: N/A;   PENILE PROSTHESIS IMPLANT N/A 02/05/2024   Procedure: INSERTION OF INFLATABLE PENILE  PROSTHESIS;  Surgeon: Lovie Arlyss CROME, MD;  Location: [facility];  Service: Urology;  Laterality: N/A;  135 MINUTES NEEDED FOR CASE   WRIST SURGERY Left     Social History   Socioeconomic History   Marital status: Widowed    Spouse name: Not on file   Number of children: Not on file   Years of education: Not on file   Highest education level: Not on file  Occupational History   Occupation: applied for disability  Tobacco Use   Smoking status: Never    Passive exposure: Never   Smokeless tobacco: Never  Vaping Use   Vaping status: Never Used  Substance and Sexual Activity   Alcohol use: Yes    Alcohol/week: 1.0 standard drink of alcohol    Types: 1 Glasses of wine per week    Comment: 1 occasionally   Drug use: No   Sexual activity: Yes  Other Topics Concern   Not on file  Social History Narrative   Wife just passed away 04-26-09   Social Drivers of Health   Financial Resource Strain: Low Risk (06/01/2020)   [facility]   Overall Financial Resource Strain (CARDIA)    Difficulty of Paying Living Expenses: Not hard at all  Food Insecurity: No Food Insecurity (06/01/2020)   Received from [facility]   Hunger Vital Sign    Within the past 12 months, you worried that your food would run out before you got the money to buy more.: Never true  Within the past 12 months, the food you bought just didn't last and you didn't have money to get more.: Never true  Transportation Needs: No Transportation Needs (06/01/2020)   Received from [facility] - Transportation    Lack of Transportation (Medical): No    Lack of Transportation (Non-Medical): No  Physical Activity: Not on file  Stress: Not on file  Social Connections: Not on file  Intimate Partner Violence: Not on file    Family History  Problem Relation Age of Onset   Heart disease Father    Hyperlipidemia Father    Hypertension Father     Allergies  Allergen Reactions   Penicillins  Rash    .Has patient had a PCN reaction causing immediate rash, facial/tongue/throat swelling, SOB or lightheadedness with hypotension: Unknown Has patient had a PCN reaction causing severe rash involving mucus membranes or skin necrosis: Unknown Has patient had a PCN reaction that required hospitalization: Unknown Has patient had a PCN reaction occurring within the last 10 years: Unknown If all of the above answers are NO, then may proceed with Cephalosporin use.  Patient Tolerates Cephalosporins    Statins Other (See Comments)    Outpatient Medications Prior to Visit  Medication Sig   aspirin  EC 81 MG tablet Take 81 mg by mouth daily.   cetirizine  (ZYRTEC  ALLERGY) 10 MG tablet Take 1 tablet (10 mg total) by mouth daily.   Cholecalciferol (VITAMIN D-3) 125 MCG (5000 UT) TABS Take 5,000 Units by mouth daily.   glipiZIDE  (GLUCOTROL  XL) 5 MG 24 hr tablet Take 1 tablet (5 mg total) by mouth daily with breakfast.   lisinopril  (ZESTRIL ) 40 MG tablet Take 1 tablet (40 mg total) by mouth daily.   Magnesium  250 MG TABS Take 250 mg by mouth daily.   [DISCONTINUED] pravastatin  (PRAVACHOL ) 10 MG tablet Take 1 tablet (10 mg total) by mouth daily.   No facility-administered medications prior to visit.    Review of Systems  Constitutional: Negative.   HENT: Negative.    Eyes: Negative.   Respiratory: Negative.  Negative for shortness of breath.   Cardiovascular: Negative.  Negative for chest pain.  Gastrointestinal: Negative.  Negative for abdominal pain, constipation and diarrhea.  Genitourinary: Negative.   Musculoskeletal:  Negative for joint pain and myalgias.  Skin: Negative.   Neurological: Negative.  Negative for dizziness and headaches.  Endo/Heme/Allergies: Negative.   All other systems reviewed and are negative.      Objective:   BP 118/78 (BP Location: Right Arm, Patient Position: Sitting, Cuff Size: Small)   Pulse 80   Ht 6' (1.829 m)   Wt 150 lb 9.6 oz (68.3 kg)    SpO2 98%   BMI 20.43 kg/m   Vitals:   11/10/24 1031 11/10/24 1052  BP: (!) 142/80 118/78  Pulse: 80   Height: 6' (1.829 m)   Weight: 150 lb 9.6 oz (68.3 kg)   SpO2: 98%   BMI (Calculated): 20.42     Physical Exam Nursing note reviewed.  Constitutional:      Appearance: Normal appearance. He is normal weight.  HENT:     Head: Normocephalic and atraumatic.     Nose: Nose normal.     Mouth/Throat:     Mouth: Mucous membranes are moist.     Pharynx: Oropharynx is clear.  Eyes:     Extraocular Movements: Extraocular movements intact.     Conjunctiva/sclera: Conjunctivae normal.     Pupils: Pupils are equal, round,  and reactive to light.  Cardiovascular:     Rate and Rhythm: Normal rate and regular rhythm.     Pulses: Normal pulses.     Heart sounds: Normal heart sounds.  Pulmonary:     Effort: Pulmonary effort is normal.     Breath sounds: Normal breath sounds.  Abdominal:     General: Abdomen is flat. Bowel sounds are normal.     Palpations: Abdomen is soft.  Musculoskeletal:        General: Normal range of motion.     Cervical back: Normal range of motion.  Skin:    General: Skin is warm and dry.  Neurological:     General: No focal deficit present.     Mental Status: He is alert and oriented to person, place, and time.  Psychiatric:        Mood and Affect: Mood normal.        Behavior: Behavior normal.        Thought Content: Thought content normal.        Judgment: Judgment normal.      No results found for any visits on 11/10/24.  Recent Results (from the past 2160 hours)  Hemoglobin A1c     Status: None   Collection Time: 10/30/24  8:26 AM  Result Value Ref Range   Hgb A1c MFr Bld 5.4 4.8 - 5.6 %    Comment:          Prediabetes: 5.7 - 6.4          Diabetes: >6.4          Glycemic control for adults with diabetes: <7.0    Est. average glucose Bld gHb Est-mCnc 108 mg/dL  TSH     Status: None   Collection Time: 10/30/24  8:26 AM  Result Value Ref  Range   TSH 1.580 0.450 - 4.500 uIU/mL  CMP14+EGFR     Status: Abnormal   Collection Time: 10/30/24  8:26 AM  Result Value Ref Range   Glucose 91 70 - 99 mg/dL   BUN 29 (H) 8 - 27 mg/dL   Creatinine, Ser 8.99 0.76 - 1.27 mg/dL   eGFR 86 >40 fO/fpw/8.26   BUN/Creatinine Ratio 29 (H) 10 - 24   Sodium 142 134 - 144 mmol/L   Potassium 4.3 3.5 - 5.2 mmol/L   Chloride 102 96 - 106 mmol/L   CO2 25 20 - 29 mmol/L   Calcium  9.3 8.6 - 10.2 mg/dL   Total Protein 6.0 6.0 - 8.5 g/dL   Albumin 4.2 3.8 - 4.9 g/dL   Globulin, Total 1.8 1.5 - 4.5 g/dL   Bilirubin Total 0.4 0.0 - 1.2 mg/dL   Alkaline Phosphatase 116 47 - 123 IU/L   AST 16 0 - 40 IU/L   ALT 13 0 - 44 IU/L  Lipid panel     Status: None   Collection Time: 10/30/24  8:26 AM  Result Value Ref Range   Cholesterol, Total 166 100 - 199 mg/dL   Triglycerides 64 0 - 149 mg/dL   HDL 57 >60 mg/dL   VLDL Cholesterol Cal 13 5 - 40 mg/dL   LDL Chol Calc (NIH) 96 0 - 99 mg/dL   Chol/HDL Ratio 2.9 0.0 - 5.0 ratio    Comment:                                   T. Chol/HDL  Ratio                                             Men  Women                               1/2 Avg.Risk  3.4    3.3                                   Avg.Risk  5.0    4.4                                2X Avg.Risk  9.6    7.1                                3X Avg.Risk 23.4   11.0       Assessment & Plan:  Increase pravastatin  to 20 mg daily Continue PT Continue current medications  Problem List Items Addressed This Visit       Cardiovascular and Mediastinum   Hypertension associated with diabetes (HCC) - Primary   Relevant Medications   pravastatin  (PRAVACHOL ) 20 MG tablet     Endocrine   Diabetic polyneuropathy associated with diabetes mellitus due to underlying condition (HCC)   Relevant Medications   pravastatin  (PRAVACHOL ) 20 MG tablet   Combined hyperlipidemia associated with type 2 diabetes mellitus (HCC)   Relevant Medications   pravastatin  (PRAVACHOL )  20 MG tablet     Other   Hx of AKA (above knee amputation), left (HCC)    Return in about 4 months (around 03/11/2025) for fasting lab work prior.   Total time spent: 25 minutes. This time includes review of previous notes and results and patient face to face interaction during today's visit.    Jeoffrey Pollen, NP  11/10/2024   This document may have been prepared by Dragon Voice Recognition software and as such may include unintentional dictation errors.

## 2024-11-27 ENCOUNTER — Encounter: Payer: Self-pay | Admitting: Cardiology

## 2024-11-27 MED ORDER — MUPIROCIN 2 % EX OINT: 1.0000 | TOPICAL_OINTMENT | Freq: Two times a day (BID) | CUTANEOUS | 0 refills | Status: AC

## 2024-12-29 ENCOUNTER — Ambulatory Visit: Attending: Cardiology | Admitting: Physical Therapy

## 2024-12-29 ENCOUNTER — Encounter: Payer: Self-pay | Admitting: Physical Therapy

## 2024-12-29 ENCOUNTER — Other Ambulatory Visit: Payer: Self-pay

## 2024-12-29 VITALS — BP 165/74 | HR 67

## 2024-12-29 DIAGNOSIS — M6281 Muscle weakness (generalized): Secondary | ICD-10-CM | POA: Diagnosis present

## 2024-12-29 DIAGNOSIS — Z89612 Acquired absence of left leg above knee: Secondary | ICD-10-CM | POA: Insufficient documentation

## 2024-12-29 DIAGNOSIS — R2681 Unsteadiness on feet: Secondary | ICD-10-CM | POA: Diagnosis present

## 2024-12-29 DIAGNOSIS — R269 Unspecified abnormalities of gait and mobility: Secondary | ICD-10-CM | POA: Insufficient documentation

## 2024-12-29 DIAGNOSIS — R2689 Other abnormalities of gait and mobility: Secondary | ICD-10-CM | POA: Diagnosis present

## 2024-12-29 NOTE — Therapy (Unsigned)
 "  OUTPATIENT PHYSICAL THERAPY PROSTHETICS EVALUATION   Patient Name: Jeremy Padilla MRN: [id number] DOB:09/16/1966, 59 y.o., male Today's Date: 12/29/2024  PCP: Carin Gauze, NP REFERRING PROVIDER: Carin Gauze, NP  END OF SESSION:  PT End of Session - 12/29/24 1324     Visit Number 1    Number of Visits 17   16 + eval   Date for Recertification  03/06/25   pushed out due to potential scheduling delay; pt going out of town mid-February (8-16)   Authorization Type Medicare A & B with secondary Medicaid    Progress Note Due on Visit 10    PT Start Time 1316    PT Stop Time 1402    PT Time Calculation (min) 46 min    Equipment Utilized During Treatment Gait belt    Activity Tolerance Patient tolerated treatment well    Behavior During Therapy [facility] for tasks assessed/performed          Past Medical History:  Diagnosis Date   Abscess 10/05/2015   Diabetes mellitus without complication (HCC)    Heart murmur    Herpes exposure    Hyperlipidemia    Hypertension    Past Surgical History:  Procedure Laterality Date   AMPUTATION Left 06/23/2020   Procedure: AMPUTATION ABOVE KNEE;  Surgeon: Marea Selinda RAMAN, MD;  Location: [facility] OR[facility];  Service: General;  Laterality: Left;   LOWER EXTREMITY ANGIOGRAPHY Left 04/09/2019   Procedure: LOWER EXTREMITY ANGIOGRAPHY;  Surgeon: Marea Selinda RAMAN, MD;  Location: [facility];  Service: Cardiovascular;  Laterality: Left;   LOWER EXTREMITY ANGIOGRAPHY Left 04/19/2020   Procedure: Lower Extremity Angiography;  Surgeon: Marea Selinda RAMAN, MD;  Location: [facility];  Service: Cardiovascular;  Laterality: Left;   LOWER EXTREMITY ANGIOGRAPHY Left 04/20/2020   Procedure: Lower Extremity Angiography;  Surgeon: Marea Selinda RAMAN, MD;  Location: [facility];  Service: Cardiovascular;  Laterality: Left;   LOWER EXTREMITY INTERVENTION N/A 05/30/2019   Procedure: LOWER EXTREMITY INTERVENTION;  Surgeon: Jama Cordella MATSU, MD;  Location: [facility]  INVASIVE CV LAB;  Service: Cardiovascular;  Laterality: N/A;   PENILE PROSTHESIS IMPLANT N/A 02/05/2024   Procedure: INSERTION OF INFLATABLE PENILE PROSTHESIS;  Surgeon: Lovie Arlyss CROME, MD;  Location: [facility];  Service: Urology;  Laterality: N/A;  135 MINUTES NEEDED FOR CASE   WRIST SURGERY Left    Patient Active Problem List   Diagnosis Date Noted   Combined hyperlipidemia associated with type 2 diabetes mellitus (HCC) 11/10/2024   Skin abrasion 01/29/2024   Edema of toe 08/20/2023   Toe infection 08/20/2023   Diabetic polyneuropathy associated with diabetes mellitus due to underlying condition (HCC) 05/18/2023   Benign mole 03/20/2023   Mixed hyperlipidemia 03/20/2023   Hx of AKA (above knee amputation), left (HCC) 09/10/2020   Protein-calorie malnutrition, severe 06/23/2020   Pressure injury of skin 06/22/2020   BKA stump complication (HCC) 06/21/2020   Hyponatremia 06/21/2020   Dehydration 06/21/2020   Ischemic leg 04/20/2020   Ischemia 04/16/2020   Ischemia of left lower extremity 05/30/2019   PAD (peripheral artery disease) 04/08/2019   Lacunar infarction (HCC) 08/03/2017   Bruit of left carotid artery 08/03/2017   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) 08/02/2017   Abscess or cellulitis, neck 10/08/2015   Hyperglycemia 10/08/2015   Hypertension associated with diabetes (HCC) 10/08/2015   Heart murmur 10/08/2015    ONSET DATE: July 2021 (initial amputation)  REFERRING DIAG: S10.387 (ICD-10-CM) - Hx  of AKA (above knee amputation), left (HCC)  THERAPY DIAG:  Other abnormalities of gait and mobility  Unsteadiness on feet  Muscle weakness (generalized)  Abnormality of gait and mobility  Rationale for Evaluation and Treatment: Rehabilitation  SUBJECTIVE:   SUBJECTIVE STATEMENT: Patient presents for re-evaluation as he has received a new prosthetic.  He is having trouble adjusting to tightness of his new socket so is swapping  out for his old prosthetic depending on activity needs for the day.  Does notice less rotation with new socket.   He is ambulating w/ 2WW w/ new prosthetic. He is still able to walk with a cane with the old prosthetic.  New prosthetic does not press into his groin like the old one does.  He would like to be able to use the cane with this prosthetic. Pt accompanied by: self (drives himself)  PERTINENT HISTORY: HTN (goal <140/90), DM2, lacunar infarction, PAD, heart murmur, diabetic retinopathy (treated with shots)  PAIN:  Are you having pain? 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 No - has had a near fall when going out the door of his home w/ new prosthetic  LIVING ENVIRONMENT: Lives with: sister comes and lives part-time Lives in: House/apartment Home Access: Stairs to enter Home layout: One level Stairs: Yes: External: 4-5 steps; can reach both Has following equipment at home: Single point cane, Walker - 2 wheeled, shower chair, Grab bars, and standard walker - no wheels  PLOF: Requires assistive device for independence  PATIENT GOALS: to get back to walking with the cane with my new leg  OBJECTIVE:  Note: Objective measures were completed at Evaluation unless otherwise noted.  LUE in sitting prior to assessments: Vitals:   12/29/24 1321  BP: (!) 165/74  Pulse: 67   DIAGNOSTIC FINDINGS: No recent relevant imaging.  COGNITION: Overall cognitive status: Within functional limits for tasks assessed   SENSATION: Light touch: WFL  POSTURE: forward head  LOWER EXTREMITY ROM:  Active ROM Right eval Left eval  Hip flexion WNL WNL  Hip extension    Hip abduction    Hip adduction     Hip internal rotation    Hip external rotation    Knee flexion    Knee extension    Ankle dorsiflexion    Ankle plantarflexion    Ankle inversion    Ankle eversion     (Blank rows = not tested)  LOWER EXTREMITY MMT:  MMT Right eval Left eval  Hip flexion 4+/5 5/5  Hip extension    Hip abduction  4+/5  Hip adduction    Hip internal rotation    Hip external rotation    Knee flexion    Knee extension 5/5   Ankle dorsiflexion 5/5   Ankle plantarflexion    Ankle inversion    Ankle eversion    (Blank rows = not tested)  BED MOBILITY:  Sit to supine Complete Independence Supine to sit Complete Independence Rolling to Right Complete Independence Rolling to Left Complete Independence  TRANSFERS: Sit to stand: Modified independence Stand to sit: Modified independence  GAIT: Gait pattern: step to pattern, decreased step length- Right, decreased step length- Left, decreased stance time- Right,  decreased stance time- Left, decreased stride length, decreased hip/knee flexion- Right, decreased hip/knee flexion- Left, decreased ankle dorsiflexion- Right, shuffling, poor foot clearance- Right, and poor foot clearance- Left Distance walked: various clinic distances Assistive device utilized: Walker - 2 wheeled Level of assistance: Modified independence Gait velocity: 1.51 ft/sec Comments: Pt has consistent shortened stride and step to pattern.   FUNCTIONAL TESTS:  5 times sit to stand: 15.78 sec w/ BUE momentum into standing, instability in immediate standing 10 meter walk test: 21.87 sec = 0.46 m/sec OR 1.51 ft/sec AMPPRO - To be assessed.  CURRENT PROSTHETIC WEAR ASSESSMENT: Patient is independent with: skin check, residual limb care, care of non-amputated limb, prosthetic cleaning, ply sock cleaning, proper wear schedule/adjustment, and proper weight-bearing schedule/adjustment Patient is dependent with: correct ply sock adjustment Donning prosthesis: Complete  Independence Doffing prosthesis: Complete Independence Prosthetic wear tolerance: 5/day, 7 days/week Prosthetic weight bearing tolerance: 30 minutes w/ 2WW before needing to sit  Edema: none significant Residual limb condition: mild scar invagination, well healed; purple diffuse color around lower 1/4th of residual limb (pt reports this has been normal since changing suspension) Prosthetic description: K3 - suction suspension, polycentric hydraulic (Modular EBS knee), sach foot, fixed ankle K code/activity level with prosthetic use: Level 3  PATIENT SURVEYS:  None applicable to chief complaint                                                                                                                              TREATMENT DATE: 12/29/2024   TREATMENT:  PATIENT EDUCATED ON FOLLOWING PROSTHETIC CARE: Other education  Residual limb care, Prosthetic cleaning, and Correct ply sock adjustment; monitoring areas of redness or purple (has some diffuse purple on distal 1 inch of residual limb from suction); how to use valve on socket, liner cleaning  PATIENT EDUCATION: Education details: Will reassess AMPPRO next visit.  BP parameters for therapy - monitor at home (he has monitor), checking blood glucose, resume HEP and sink HEP to help with new prosthetic tolerance.  PT POC and goals to be assessed.  Edu on prosthetic components. Person educated: Patient Education method: Explanation, Demonstration, and Handouts Education comprehension: verbalized understanding and needs further education  HOME EXERCISE PROGRAM: Access Code: [email or web address] URL: [email or web address] Date: 05/09/2024 Prepared by: Daved Bull  Exercises - Sidelying Hip Abduction wtih Flexion and Extension (BKA)  - 1 x daily - 7 x weekly - 3 sets - 10 reps - Sidelying Hip Abduction (AKA)  - 1 x daily - 7 x weekly - 3 sets - 10 reps - Sidelying Hip Circles (AKA)  - 1 x daily - 7 x weekly - 3 sets - 10 reps -  Supine Psoas Stretch (AKA)  - 1 x daily - 7 x weekly - 3 sets - 10 reps  Do each exercise 1-2  times per day Do each exercise 10 repetitions Hold each exercise for 2 seconds to feel your location  AT [facility] FIND YOUR  MIDLINE POSITION AND PLACE FEET EQUAL DISTANCE FROM THE MIDLINE.  Try to find this position when standing still for activities.   USE TAPE ON FLOOR TO MARK THE MIDLINE POSITION. You also should try to feel with your limb pressure in socket.  You are trying to feel with limb what you used to feel with the bottom of your foot.  Side to Side Shift: Moving your hips only (not shoulders): move weight onto your left leg, HOLD/FEEL.  Move back to equal weight on each leg, HOLD/FEEL. Move weight onto your right leg, HOLD/FEEL. Move back to equal weight on each leg, HOLD/FEEL. Repeat.  Start with both hands on sink, progress to right hand only, then no hands.  Front to Back Shift: Moving your hips only (not shoulders): move your weight forward onto your toes, HOLD/FEEL. Move your weight back to equal Flat Foot on both legs, HOLD/FEEL. Move your weight back onto your heels, HOLD/FEEL. Move your weight back to equal on both legs, HOLD/FEEL. Repeat.   tart with both hands on sink, progress to right hand only, then no hands. Moving Cones / Cups: With equal weight on each leg: Hold on with one hand the first time, then progress to no hand supports. Move cups from one side of sink to the other. Place cups ~2 out of your reach, progress to 10 beyond reach.  Place one hand in middle of sink and reach with other hand. Do both arms.  Then hover one hand and move cups with other hand.  Overhead/Upward Reaching: alternated reaching up to top cabinets or ceiling if no cabinets present. Keep equal weight on each leg. Start with one hand support on counter while other hand reaches and progress to no hand support with reaching.  ace one hand in middle of sink and reach with other hand. Do both arms.  Then hover  one hand and move cups with other hand.  5.   Looking Over Shoulders: With equal weight on each leg: alternate turning to look over your shoulders with one hand support on counter as needed.  Start with head motions only to look in front of shoulder, then even with shoulder and progress to looking behind you. To look to side, move head /eyes, then shoulder on side looking pulls back, shift more weight to side looking and pull hip back. ace one hand in middle of sink and let go with other hand so your shoulder can pull back. Switch hands to look other way.   Then hover one hand and move cups with other hand.  6.  Stepping with leg that is not amputated:  Move items under cabinet out of your way. Shift your hips/pelvis so weight on prosthesis. SLOWLY step other leg so front of foot is in cabinet. Then step back to floor.    ASSESSMENT:  CLINICAL IMPRESSION: Patient is a 59 y.o. male who was seen today for physical therapy evaluation and treatment for left AKA improved management and utilization.  Pt has a significant PMH of HTN (goal <140/90), DM2, lacunar infarction, PAD, heart murmur, and diabetic retinopathy (treated with shots).  Identified impairments include visual limitations, reliance on sound leg during step to gait, DME needs to replace current AD, left hip weakness, decreased dynamic stability and decreased safety awareness.  Education provided to improve sock ply and sweat management for safety and comfort of prosthetic use.  Evaluation was limited due to safety concerns, elevated BP outside recommended range, and education needs.  He  would benefit from skilled PT to address impairments as noted and progress towards long term goals.  ***  OBJECTIVE IMPAIRMENTS: Abnormal gait, decreased activity tolerance, decreased balance, decreased endurance, decreased knowledge of use of DME, decreased mobility, difficulty walking, decreased strength, decreased safety awareness, impaired vision/preception,  improper body mechanics, postural dysfunction, and prosthetic dependency .   ACTIVITY LIMITATIONS: carrying, lifting, bending, standing, squatting, stairs, transfers, and locomotion level  PARTICIPATION LIMITATIONS: community activity, occupation, and yard work  PERSONAL FACTORS: Past/current experiences, Time since onset of injury/illness/exacerbation, and 3+ comorbidities: lacunar infarction, HTN, diabetic retinopathy are also affecting patient's functional outcome.   REHAB POTENTIAL: Good  CLINICAL DECISION MAKING: Evolving/moderate complexity  EVALUATION COMPLEXITY: Moderate   GOALS: Goals reviewed with patient? Yes  SHORT TERM GOALS: Target date: 06/06/2024  Pt will be independent and compliant with introductory prosthetic management, strengthening and standing balance HEP in order to maintain functional progress and improve mobility. Baseline:  Initiated on eval Goal status: INITIAL  2.  Pt will be independent in sock ply adjustment in order to improve wear tolerance and prosthetic fit. Baseline: unaware of sock ply and how to appropriately adjust for fit on eval Goal status: INITIAL  3.  5xSTS to be assessed w/ STG set as appropriate. Baseline:  To be assessed. Goal status: INITIAL  4.  to be assessed w/ goal set as appropriate. Baseline: To be assessed. Goal status: INITIAL  5.  AMPPRO to be assessed w/ goal set as appropriate. Baseline: To be assessed. Goal status: INITIAL  LONG TERM GOALS: Target date: 07/04/2024  Pt will be independent and compliant with advanced prosthetic management, strengthening and standing balance HEP in order to maintain functional progress and improve mobility. Baseline: To be progressed. Goal status: INITIAL  2.   5xSTS to be assessed w/ LTG set as appropriate. Baseline: To be assessed. Goal status: INITIAL  3.  to be assessed w/ goal set as appropriate. Baseline: To be assessed. Goal status: INITIAL  4.  AMPPRO to  be assessed w/ goal set as appropriate. Baseline: To be assessed. Goal status: INITIAL  5.  Pt will navigate 4 stairs using step-to pattern and bilateral rails at mod I level to demonstrate safety in home environment. Baseline: Pt reports ind at home Goal status: INITIAL  6.  Pt will initiate ambulation in clinic >/=50 ft with least restrictive cane option in order to demonstrate improved upright stability and prosthetic management. Baseline: Pt attempts a few feet with cane in home only Goal status: INITIAL   PLAN:  PT FREQUENCY: 2x/week  PT DURATION: 8 weeks  PLANNED INTERVENTIONS: [id number]- PT Re-evaluation, 97750- Physical Performance Testing, 97110-Therapeutic exercises, 97530- Therapeutic activity, [id number]- Neuromuscular re-education, 97535- Self Care, [id number]- Manual therapy, [id number]- Gait training, [phone]- Prosthetic Initial , [phone]- Electrical stimulation (manual), Patient/Family education, Balance training, Stair training, Vestibular training, and DME instructions  PLAN FOR NEXT SESSION: ASSESS AMPPRO - set goals as appropriate.  Single leg bridges, work towards cane, progress HEP - standing balance/sink HEP?  Ramp and unlevel surfaces, stairs, long distance ambulation.  Lifting/pushing/pulling.  Daved KATHEE Bull, PT, DPT 12/29/2024, 2:02 PM    "

## 2025-01-07 ENCOUNTER — Ambulatory Visit: Admitting: Physical Therapy

## 2025-01-09 ENCOUNTER — Ambulatory Visit: Admitting: Physical Therapy

## 2025-01-14 ENCOUNTER — Ambulatory Visit: Attending: Cardiology | Admitting: Physical Therapy

## 2025-01-14 ENCOUNTER — Encounter: Payer: Self-pay | Admitting: Physical Therapy

## 2025-01-14 VITALS — BP 153/60 | HR 44

## 2025-01-14 DIAGNOSIS — R2681 Unsteadiness on feet: Secondary | ICD-10-CM

## 2025-01-14 DIAGNOSIS — M6281 Muscle weakness (generalized): Secondary | ICD-10-CM

## 2025-01-14 DIAGNOSIS — R2689 Other abnormalities of gait and mobility: Secondary | ICD-10-CM

## 2025-01-14 NOTE — Therapy (Signed)
 "  OUTPATIENT PHYSICAL THERAPY PROSTHETICS TREATMENT   Patient Name: Jeremy Padilla MRN: [id number] DOB:Jun 06, 1966, 59 y.o., male Today's Date: 01/14/2025  PCP: Carin Gauze, NP REFERRING PROVIDER: Carin Gauze, NP  END OF SESSION:  PT End of Session - 01/14/25 1321     Visit Number 2    Number of Visits 17   16 + eval   Date for Recertification  03/06/25   pushed out due to potential scheduling delay; pt going out of town mid-February (8-16)   Authorization Type Medicare A & B with secondary Medicaid    Progress Note Due on Visit 10    PT Start Time 1311    PT Stop Time 1356    PT Time Calculation (min) 45 min    Equipment Utilized During Treatment Gait belt    Activity Tolerance Patient tolerated treatment well    Behavior During Therapy [facility] for tasks assessed/performed          Past Medical History:  Diagnosis Date   Abscess 10/05/2015   Diabetes mellitus without complication (HCC)    Heart murmur    Herpes exposure    Hyperlipidemia    Hypertension    Past Surgical History:  Procedure Laterality Date   AMPUTATION Left 06/23/2020   Procedure: AMPUTATION ABOVE KNEE;  Surgeon: Marea Selinda RAMAN, MD;  Location: [facility];  Service: General;  Laterality: Left;   LOWER EXTREMITY ANGIOGRAPHY Left 04/09/2019   Procedure: LOWER EXTREMITY ANGIOGRAPHY;  Surgeon: Marea Selinda RAMAN, MD;  Location: [facility];  Service: Cardiovascular;  Laterality: Left;   LOWER EXTREMITY ANGIOGRAPHY Left 04/19/2020   Procedure: Lower Extremity Angiography;  Surgeon: Marea Selinda RAMAN, MD;  Location: [facility];  Service: Cardiovascular;  Laterality: Left;   LOWER EXTREMITY ANGIOGRAPHY Left 04/20/2020   Procedure: Lower Extremity Angiography;  Surgeon: Marea Selinda RAMAN, MD;  Location: [facility];  Service: Cardiovascular;  Laterality: Left;   LOWER EXTREMITY INTERVENTION N/A 05/30/2019   Procedure: LOWER EXTREMITY INTERVENTION;  Surgeon: Jama Cordella MATSU, MD;  Location: [facility]  INVASIVE CV LAB;  Service: Cardiovascular;  Laterality: N/A;   PENILE PROSTHESIS IMPLANT N/A 02/05/2024   Procedure: INSERTION OF INFLATABLE PENILE PROSTHESIS;  Surgeon: Lovie Arlyss CROME, MD;  Location: [facility];  Service: Urology;  Laterality: N/A;  135 MINUTES NEEDED FOR CASE   WRIST SURGERY Left    Patient Active Problem List   Diagnosis Date Noted   Combined hyperlipidemia associated with type 2 diabetes mellitus (HCC) 11/10/2024   Skin abrasion 01/29/2024   Edema of toe 08/20/2023   Toe infection 08/20/2023   Diabetic polyneuropathy associated with diabetes mellitus due to underlying condition (HCC) 05/18/2023   Benign mole 03/20/2023   Mixed hyperlipidemia 03/20/2023   Hx of AKA (above knee amputation), left (HCC) 09/10/2020   Protein-calorie malnutrition, severe 06/23/2020   Pressure injury of skin 06/22/2020   BKA stump complication (HCC) 06/21/2020   Hyponatremia 06/21/2020   Dehydration 06/21/2020   Ischemic leg 04/20/2020   Ischemia 04/16/2020   Ischemia of left lower extremity 05/30/2019   PAD (peripheral artery disease) 04/08/2019   Lacunar infarction (HCC) 08/03/2017   Bruit of left carotid artery 08/03/2017   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  He denies falls, but reports he has not been doing much activity due to the cold weather and snow.  He is ambulating w/ 2WW  reporting prosthesis feels tight, but he did speak to prosthetist who endorses he will adjust to new socket fit but socket was measured specifically to him.   Pt accompanied by: self (drives himself)  PERTINENT HISTORY: HTN (goal <140/90), DM2, lacunar infarction, PAD, heart murmur, diabetic retinopathy (treated with shots)  PAIN:  Are you having pain? 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 No - has had a near fall when going out the door of his home w/ new prosthetic  LIVING ENVIRONMENT: Lives with: sister comes and lives part-time Lives in: House/apartment Home Access: Stairs to enter Home layout: One level Stairs: Yes: External: 4-5 steps; can reach both Has following equipment at home: Single point cane, Walker - 2 wheeled, shower chair, Grab bars, and standard walker - no wheels  PLOF: Requires assistive device for independence  PATIENT GOALS: to get back to walking with the cane with my new leg  OBJECTIVE:  Note: Objective measures were completed at Evaluation unless otherwise noted.  LUE in sitting prior to assessments: Vitals:   01/14/25 1318  BP: (!) 153/60  Pulse: (!) 44   DIAGNOSTIC FINDINGS: No recent relevant imaging.  COGNITION: Overall cognitive status: Within functional limits for tasks assessed   SENSATION: Light touch: WFL  POSTURE: forward head  LOWER EXTREMITY ROM:  Active ROM Right eval Left eval  Hip flexion WNL WNL  Hip extension    Hip abduction    Hip adduction    Hip internal rotation    Hip external rotation    Knee flexion    Knee extension    Ankle dorsiflexion    Ankle plantarflexion    Ankle inversion    Ankle eversion     (Blank rows = not tested)  LOWER EXTREMITY MMT:  MMT Right eval Left eval  Hip flexion 4+/5 5/5  Hip extension    Hip  abduction  4+/5  Hip adduction    Hip internal rotation    Hip external rotation    Knee flexion    Knee extension 5/5   Ankle dorsiflexion 5/5   Ankle plantarflexion    Ankle inversion    Ankle eversion    (Blank rows = not tested)  BED MOBILITY:  Sit to supine Complete Independence Supine to sit Complete Independence Rolling to Right Complete Independence Rolling to Left Complete Independence  TRANSFERS: Sit to stand: Modified independence Stand to sit: Modified independence  GAIT: Gait pattern: step to pattern, decreased step length- Right, decreased step length- Left, decreased stance time- Right, decreased stance time- Left, decreased stride length, decreased hip/knee flexion- Right, decreased hip/knee flexion- Left, decreased ankle dorsiflexion- Right, shuffling, poor foot clearance- Right, and poor foot clearance- Left Distance walked: various clinic distances Assistive device utilized: Walker - 2 wheeled Level of assistance: Modified independence Gait velocity: 1.51 ft/sec Comments: Pt has consistent shortened stride and step to pattern.   FUNCTIONAL TESTS:  5 times sit to stand: 15.78 sec w/ BUE momentum into standing, instability in immediate standing 10 meter walk test: 21.87 sec = 0.46 m/sec OR 1.51 ft/sec AMPPRO - To be assessed.  CURRENT PROSTHETIC WEAR ASSESSMENT: Patient is independent with: skin check, residual limb care, care of non-amputated limb, prosthetic cleaning, ply sock cleaning, proper wear schedule/adjustment, and proper weight-bearing schedule/adjustment Patient is dependent with: correct ply sock adjustment Donning prosthesis: Complete Independence Doffing prosthesis: Complete Independence Prosthetic wear tolerance: 5 hrs/day, 7 days/week Prosthetic weight bearing tolerance: 30 minutes w/ 2WW before needing to sit  Edema: none significant Residual limb condition: mild scar invagination, well healed; purple diffuse color around lower 1/4th of  residual limb (pt reports this has been normal since changing suspension) Prosthetic description: K3 - suction suspension, polycentric hydraulic (Modular EBS knee), sach foot, fixed ankle K code/activity level with prosthetic use: Level 3  PATIENT SURVEYS:  None applicable to chief complaint                                                                                                                              TREATMENT DATE: 01/14/2025 AMPUTEE MOBILITY PREDICTOR ASSESSMENT TOOL Initial instructions: Client is seated in a hard chair with arms. The following manoeuvres are tested with or without the use of the prosthesis.  Advise the person of each task or group of tasks prior to performance.  Please avoid unnecessary chatter throughout the test.  Safety First, no task should be performed if either the tester or client is uncertain of a safe outcome.  1. Sitting Balance: Sit forward in a chair with arms folded across chest for 60s. Cannot sit upright independently for 60s Can sit upright independently for 60s = 0 = 1  1  2. Sitting reach:  Reach forwards and grasp the ruler.  (Tester holds ruler 12in beyond extended arms midline to the sternum) Does not attempt Cannot grasp or requires arm support Reaches forward and successfully grasps item.  = 0 = 1  = 2    2  3. Chair to chair transfer: 2 chairs at 90. Pt. may choose direction and use their upper limbs. Cannot do or requires physical assistance Performs independently, but appears unsteady Performs independently, appears to be steady and safe = 0  = 1 = 2  2  4. Arises from a chair: Ask pt. to fold arms across chest and stand. If unable, use arms or assistive device. Unable without help (physical assistance) Able, uses arms/assist device to help Able, without using arms = 0  = 1 = 2   1  5. Attempts to arise from a chair: (stopwatch ready) If attempt in no. 4. was without arms then ignore and allow another attempt  without penalty. Unable without help (physical assistance) Able requires >1 attempt Able to rise one attempt = 0  = 1 = 2   2  6. Immediate Standing Balance: (first 5s) Begin timing immediately. Unsteady (  staggers, moves foot, sways ) Steady using walking aid or other support Steady without walker or other support = 0 = 1  = 2  2  7. Standing Balance (30s): (stopwatch ready) For item no.s 7 & 8, first attempt is without assistive device.  If support is required allow after first attempt Unsteady Steady but uses walking aid or other support Standing without support = 0  = 1 = 2    2  8. Single limb standing balance: (stopwatch ready) Time the duration of single limb standing on both the sound and prosthetic limb up to 30s.   Grade the quality, not the time.  *Eliminate item 8 for AMPnoPRO*  Sound side  30 seconds  Prosthetic side 30 seconds Non-prosthetic side Unsteady Steady but uses walking aid or other support for 30s Single-limb standing without support for 30s  Prosthetic Side Unsteady Steady but uses walking aid or other support for 30s Single-limb standing without support for 30s  = 0 = 1  = 2    = 0  = 1  = 2    2     1   9. Standing reach: Reach forward and grasp the ruler.  (Tester holds ruler 12in beyond extended arm(s) midline to the sternum) Does not attempt Cannot grasp or requires arm support on assistive device Reaches forward and successfully grasps item no support = 0  = 1  = 2   1  10. Nudge test: With feet as close together as possible, examiner pushes lightly on pt.s sternum with palm of hand 3 times (toes should rise) Begins to fall Staggers, grabs, catches self ore uses assistive device Steady = 0  = 1 = 2   1  11. Eyes Closed: (at maximum position #7) If support is required grade as unsteady. Unsteady or grips assistive device Steady without any use of assistive device = 0  = 1  1    12. Pick up objects  off the floor: Pick up a pencil off the floor placed midline 12in in front of foot. Unable to pick up object and return to standing Performs with some help (table, chair, walking aid etc) Performs independently (without help) = 0  = 1   = 2  1  13. Sitting down:  Ask pt. to fold arms across chest and sit. If unable, use arm or assistive device. Unsafe (misjudged distance, falls into chair ) Uses arms, assistive device or not a smooth motion Safe, smooth motion = 0  = 1 = 2   2  14. Initiation of gait: (immediately after told to go) Any hesitancy or multiple attempts to start No hesitancy = 0 = 1  0  15. Step length and height: Walk a measured distance of 58ft twice (up and back). Four scores are required or two scores (a. & b.) for each leg. Marked deviation is defined as extreme substitute movements to avoid clearing the floor. a. Swing Foot Does not advance a minimum of 12in Advances a minimum of 12in  b. Foot Clearance Foot does not completely clear floor without deviation Foot completely clears floor without marked deviation  = 0  = 1   = 0 = 1 Prosthesis  0   1 Sound  1   1  16. Step Continuity Stopping or discontinuity between steps (stop & go gait) Steps appear continuous = 0  = 1  1  17. Turning:  180 degree turn when returning to chair. Unable to turn,  requires intervention to prevent falling Greater than three steps but completes task without intervention No more than three continuous steps with or without assistive aid = 0  = 1  = 2    1  18. Variable cadence:  Walk a distance of 62ft fast as possible safely 4 times.  (Speeds may vary from slow to fast and fast to slow varying cadence) Unable to vary cadence in a controlled manner Asymmetrical increase in cadence controlled manner Symmetrical increase in speed in a controlled manner  = 0 = 1 = 2      1  19. Stepping over an obstacle: Place a movable box of 4in in height in  the walking path. Cannot step over the box Catches foot, interrupts stride Steps over without interrupting stride = 0 = 1 = 2   1  20. Stairs (must have at least 2 steps):  Try to go up and down these stairs without holding on to the railing.  Dont hesitate to permit pt. to hold on to rail.  Safety First, if examiner feels that any risk in involved omit and score as 0.  Ascending Unsteady, cannot do One step at a time, or must hold on to railing or device Step over step, does not hold onto the railing or device  Descending Unsteady, cannot do One step at a time, or must hold on to railing or device Step over step, does not hold onto the railing or device  = 0  = 1 = 2    = 0  = 1 = 2   1     1   21. Assistive device selection:  Add points for the use of an assistive device if used for two or more items.  If testing without prosthesis use of appropriate assistive device is mandatory.   Bed bound Wheelchair / Parallel Bars Walker Crutches (axillary or forearm) Cane (straight or quad) None = 0 = 1 = 2 = 3 = 4 = 5    2    Total Score      AMPnoPRO    N/A  /43                          AMPPRO      32 /47     K LEVEL (converted from AMP score)  AMPnoPRO   K0 = (0-8)    K1= (9-20)    K2 = (21-28)    K3 = (29-36)    K4 = (37-43)  AMPPRO    K1 = (15-26)      K2 = (27-36)     K3 = (37-42)     K4 = (43-47)  -Arm bike on level 4.0 x4.5 minutes forwards and x4.5 minutes backwards -Reviewed sink HEP to emphasize new prosthetic weight acceptance (see below for exercises)  TREATMENT:  PATIENT EDUCATED ON FOLLOWING PROSTHETIC CARE: Other education  Residual limb care, Prosthetic cleaning, and Correct ply sock adjustment; monitoring areas of redness or purple PATIENT EDUCATION: Education details: AMPPRO interpretation and K level.  BP parameters for therapy - monitor at home (he has monitor), checking blood glucose, resume HEP and sink HEP to help with new prosthetic  tolerance.  Wearing prosthetic to improve tolerance.  Monitoring redness on residual limb - currently blanchable and dissipates following removal of prosthetic. Person educated: Patient Education method: Explanation, Demonstration, and Handouts Education comprehension: verbalized understanding and needs further education  HOME EXERCISE PROGRAM: Access  Code: [email or web address] URL: [email or web address] Date: 05/09/2024 Prepared by: Daved Bull  Exercises - Sidelying Hip Abduction wtih Flexion and Extension (BKA)  - 1 x daily - 7 x weekly - 3 sets - 10 reps - Sidelying Hip Abduction (AKA)  - 1 x daily - 7 x weekly - 3 sets - 10 reps - Sidelying Hip Circles (AKA)  - 1 x daily - 7 x weekly - 3 sets - 10 reps - Supine Psoas Stretch (AKA)  - 1 x daily - 7 x weekly - 3 sets - 10 reps  Do each exercise 1-2  times per day Do each exercise 10 repetitions Hold each exercise for 2 seconds to feel your location  AT SINK FIND YOUR MIDLINE POSITION AND PLACE FEET EQUAL DISTANCE FROM THE MIDLINE.  Try to find this position when standing still for activities.   USE TAPE ON FLOOR TO MARK THE MIDLINE POSITION. You also should try to feel with your limb pressure in socket.  You are trying to feel with limb what you used to feel with the bottom of your foot.  Side to Side Shift: Moving your hips only (not shoulders): move weight onto your left leg, HOLD/FEEL.  Move back to equal weight on each leg, HOLD/FEEL. Move weight onto your right leg, HOLD/FEEL. Move back to equal weight on each leg, HOLD/FEEL. Repeat.  Start with both hands on sink, progress to right hand only, then no hands.  Front to Back Shift: Moving your hips only (not shoulders): move your weight forward onto your toes, HOLD/FEEL. Move your weight back to equal Flat Foot on both legs, HOLD/FEEL. Move your weight back onto your heels, HOLD/FEEL. Move your weight back to equal on both legs, HOLD/FEEL. Repeat.   tart with both hands on  sink, progress to right hand only, then no hands. Moving Cones / Cups: With equal weight on each leg: Hold on with one hand the first time, then progress to no hand supports. Move cups from one side of sink to the other. Place cups ~2 out of your reach, progress to 10 beyond reach.  Place one hand in middle of sink and reach with other hand. Do both arms.  Then hover one hand and move cups with other hand.  Overhead/Upward Reaching: alternated reaching up to top cabinets or ceiling if no cabinets present. Keep equal weight on each leg. Start with one hand support on counter while other hand reaches and progress to no hand support with reaching.  ace one hand in middle of sink and reach with other hand. Do both arms.  Then hover one hand and move cups with other hand.  5.   Looking Over Shoulders: With equal weight on each leg: alternate turning to look over your shoulders with one hand support on counter as needed.  Start with head motions only to look in front of shoulder, then even with shoulder and progress to looking behind you. To look to side, move head /eyes, then shoulder on side looking pulls back, shift more weight to side looking and pull hip back. ace one hand in middle of sink and let go with other hand so your shoulder can pull back. Switch hands to look other way.   Then hover one hand and move cups with other hand.  6.  Stepping with leg that is not amputated:  Move items under cabinet out of your way. Shift your hips/pelvis so weight on prosthesis. SLOWLY step other leg so front of foot  is in cabinet. Then step back to floor.    ASSESSMENT:  CLINICAL IMPRESSION: Patient has experienced setback with weather in relation to wear tolerance of prosthesis.  He continues to have some pain due to tighter fit of socket and new suspension type.  He was educated on proper monitoring of skin changes with new prosthetic and reports he is not experiencing any concerning symptoms.  AMPPRO score of  32/47 indicates a K2 performance level likely due to ongoing pain in residual limb with new prosthetic fit.  Re-iterated using prior HEP and sink HEP to improve upright tolerance and weight acceptance on new prosthesis.  He would benefit from further practice with LRAD and improved prosthetic management to decrease fall risk and resume higher level of independence achieved with prior prosthesis.  Continue per POC.    OBJECTIVE IMPAIRMENTS: Abnormal gait, decreased activity tolerance, decreased balance, decreased endurance, decreased knowledge of use of DME, decreased mobility, difficulty walking, decreased strength, decreased safety awareness, impaired vision/preception, improper body mechanics, postural dysfunction, and prosthetic dependency .   ACTIVITY LIMITATIONS: carrying, lifting, bending, standing, squatting, stairs, transfers, and locomotion level  PARTICIPATION LIMITATIONS: community activity, occupation, and yard work  PERSONAL FACTORS: Past/current experiences, Time since onset of injury/illness/exacerbation, and 3+ comorbidities: lacunar infarction, HTN, diabetic retinopathy are also affecting patient's functional outcome.   REHAB POTENTIAL: Good  CLINICAL DECISION MAKING: Evolving/moderate complexity  EVALUATION COMPLEXITY: Moderate   GOALS: Goals reviewed with patient? Yes  SHORT TERM GOALS: Target date: 01/30/2025  Pt will be be IND and able to tolerate prior established prosthetic management, strengthening and standing balance HEP with utilization of new prosthetic in order to maintain functional progress and improve mobility. Baseline:  Encouraged to resume on eval. Goal status: INITIAL  2.  Pt will be independent in sock ply adjustment in order to improve wear tolerance and prosthetic fit. Baseline: wearing socks, needing ongoing edu with new suspension type Goal status: INITIAL  3.  Pt will rise to standing w/o LOB and requiring no UE support for immediate standing  balance using new prosthetic. Baseline:  instability in immediate standing/posterior bias Goal status: INITIAL  4.  Pt will demonstrate a gait speed of >/=1.71 feet/sec in order to decrease risk for falls. Baseline: 1.51 ft/sec Goal status: INITIAL  5.  Pt will improve AMPPRO score to >/=37/47 in order to demonstrate improved functional capacity with new prosthetic componentry. Baseline: 32/47 (2/4) Goal status: INITIAL  LONG TERM GOALS: Target date: 02/27/2025  1.   Pt will decrease 5xSTS to </=12 seconds in order to demonstrate decreased risk for falls and improved functional bilateral LE strength and power. Baseline: 15.78 sec w/ BUE momentum into standing Goal status: INITIAL  3.  Pt will demonstrate a gait speed of >/=1.91 feet/sec in order to decrease risk for falls. Baseline: 1.51 ft/sec Goal status: INITIAL  4.  Pt will improve AMPPRO score to >/=42/47 in order to demonstrate improved functional capacity with new prosthetic componentry. Baseline: 32/47 (2/4) Goal status: INITIAL  5.  Pt will navigate 4 stairs using step-to pattern and bilateral rails at mod I level to demonstrate safety in home environment. Baseline: pt stumbled out doorway attempting stairs w/ new prosthesis Goal status: INITIAL  6.  Pt will return to use of standalone cane over unlevel surfaces >/=250 ft in order to demonstrate return to prior level of function and improved tolerance of new prosthetic componentry. Baseline: Unable to use cane w/ new prosthetic due to instability Goal status: INITIAL  PLAN:  PT FREQUENCY: 2x/week  PT DURATION: 8 weeks  PLANNED INTERVENTIONS: [id number]- PT Re-evaluation, 97750- Physical Performance Testing, 97110-Therapeutic exercises, 97530- Therapeutic activity, [id number]- Neuromuscular re-education, 97535- Self Care, [id number]- Manual therapy, [id number]- Gait training, [phone]- Prosthetic Initial , [phone]- Orthotic/Prosthetic subsequent, [phone]- Aquatic Therapy, [phone]- Electrical  stimulation (manual), [phone] (1-2 muscles), [id number] (3+ muscles)- Dry Needling, Patient/Family education, Balance training, Stair training, Taping, Joint mobilization, Vestibular training, DME instructions, Cryotherapy, and Moist heat  PLAN FOR NEXT SESSION:  Single leg bridges, work towards cane, How is HEP?  Ramp and unlevel surfaces, stairs, long distance ambulation.  Lifting/pushing/pulling.  Step overs.  Prosthetic weight acceptance.  Pt requests to resume arm bike as it helped his posture.  Daved KATHEE Bull, PT, DPT 01/14/2025, 1:57 PM    "

## 2025-01-16 ENCOUNTER — Ambulatory Visit: Admitting: Physical Therapy

## 2025-01-16 ENCOUNTER — Encounter: Payer: Self-pay | Admitting: Physical Therapy

## 2025-01-16 VITALS — BP 142/55 | HR 46

## 2025-01-16 DIAGNOSIS — R2681 Unsteadiness on feet: Secondary | ICD-10-CM

## 2025-01-16 DIAGNOSIS — R269 Unspecified abnormalities of gait and mobility: Secondary | ICD-10-CM

## 2025-01-16 DIAGNOSIS — M6281 Muscle weakness (generalized): Secondary | ICD-10-CM

## 2025-01-16 DIAGNOSIS — R2689 Other abnormalities of gait and mobility: Secondary | ICD-10-CM

## 2025-01-16 NOTE — Therapy (Signed)
 "  OUTPATIENT PHYSICAL THERAPY PROSTHETICS TREATMENT   Patient Name: Jeremy Padilla MRN: [id number] DOB:10/27/1966, 59 y.o., male Today's Date: 01/16/2025  PCP: Carin Gauze, NP REFERRING PROVIDER: Carin Gauze, NP  END OF SESSION:  PT End of Session - 01/16/25 1330     Visit Number 3    Number of Visits 17   16 + eval   Date for Recertification  03/06/25   pushed out due to potential scheduling delay; pt going out of town mid-February (8-16)   Authorization Type Medicare A & B with secondary Medicaid    Progress Note Due on Visit 10    PT Start Time 1318    PT Stop Time 1400    PT Time Calculation (min) 42 min    Equipment Utilized During Treatment Gait belt    Activity Tolerance Patient tolerated treatment well    Behavior During Therapy WFL for tasks assessed/performed          Past Medical History:  Diagnosis Date   Abscess 10/05/2015   Diabetes mellitus without complication (HCC)    Heart murmur    Herpes exposure    Hyperlipidemia    Hypertension    Past Surgical History:  Procedure Laterality Date   AMPUTATION Left 06/23/2020   Procedure: AMPUTATION ABOVE KNEE;  Surgeon: Marea Selinda RAMAN, MD;  Location: [facility];  Service: General;  Laterality: Left;   LOWER EXTREMITY ANGIOGRAPHY Left 04/09/2019   Procedure: LOWER EXTREMITY ANGIOGRAPHY;  Surgeon: Marea Selinda RAMAN, MD;  Location: [facility];  Service: Cardiovascular;  Laterality: Left;   LOWER EXTREMITY ANGIOGRAPHY Left 04/19/2020   Procedure: Lower Extremity Angiography;  Surgeon: Marea Selinda RAMAN, MD;  Location: [facility];  Service: Cardiovascular;  Laterality: Left;   LOWER EXTREMITY ANGIOGRAPHY Left 04/20/2020   Procedure: Lower Extremity Angiography;  Surgeon: Marea Selinda RAMAN, MD;  Location: [facility];  Service: Cardiovascular;  Laterality: Left;   LOWER EXTREMITY INTERVENTION N/A 05/30/2019   Procedure: LOWER EXTREMITY INTERVENTION;  Surgeon: Jama Cordella MATSU, MD;  Location: [facility];  Service: Cardiovascular;  Laterality: N/A;   PENILE PROSTHESIS IMPLANT N/A 02/05/2024   Procedure: INSERTION OF INFLATABLE PENILE PROSTHESIS;  Surgeon: Lovie Arlyss CROME, MD;  Location: [facility];  Service: Urology;  Laterality: N/A;  135 MINUTES NEEDED FOR CASE   WRIST SURGERY Left    Patient Active Problem List   Diagnosis Date Noted   Combined hyperlipidemia associated with type 2 diabetes mellitus (HCC) 11/10/2024   Skin abrasion 01/29/2024   Edema of toe 08/20/2023   Toe infection 08/20/2023   Diabetic polyneuropathy associated with diabetes mellitus due to underlying condition (HCC) 05/18/2023   Benign mole 03/20/2023   Mixed hyperlipidemia 03/20/2023   Hx of AKA (above knee amputation), left (HCC) 09/10/2020   Protein-calorie malnutrition, severe 06/23/2020   Pressure injury of skin 06/22/2020   BKA stump complication (HCC) 06/21/2020   Hyponatremia 06/21/2020   Dehydration 06/21/2020   Ischemic leg 04/20/2020   Ischemia 04/16/2020   Ischemia of left lower extremity 05/30/2019   PAD (peripheral artery disease) 04/08/2019   Lacunar infarction (HCC) 08/03/2017   Bruit of left carotid artery 08/03/2017   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  He is going on a cruise this weekend and woke up to no water  in his home today.  He also had a dentist appt for  fillings just prior to PT appt today.  He denies falls, but ongoing socket discomfort.     Pt accompanied by: self (drives himself)  PERTINENT HISTORY: HTN (goal <140/90), DM2, lacunar infarction, PAD, heart murmur, diabetic retinopathy (treated with shots)  PAIN:  Are you having pain? 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 No - has had a near fall when going out the door of his home w/ new prosthetic  LIVING ENVIRONMENT: Lives with: sister comes and lives part-time Lives in: House/apartment Home Access: Stairs to enter Home layout: One level Stairs: Yes: External: 4-5 steps; can reach both Has following equipment at home: Single point cane, Walker - 2 wheeled, shower chair, Grab bars, and standard walker - no wheels  PLOF: Requires assistive device for independence  PATIENT GOALS: to get back to walking with the cane with my new leg  OBJECTIVE:  Note: Objective measures were completed at Evaluation unless otherwise noted.  LUE in sitting prior to assessments: Vitals:   01/16/25 1325  BP: (!) 142/55  Pulse: (!) 46    DIAGNOSTIC FINDINGS: No recent relevant imaging.  COGNITION: Overall cognitive status: Within functional limits for tasks assessed   SENSATION: Light touch: WFL  POSTURE: forward head  LOWER EXTREMITY ROM:  Active ROM Right eval Left eval  Hip flexion WNL WNL  Hip extension    Hip abduction    Hip adduction    Hip internal rotation    Hip external rotation    Knee flexion    Knee extension    Ankle dorsiflexion    Ankle plantarflexion    Ankle inversion    Ankle eversion     (Blank rows = not tested)  LOWER EXTREMITY MMT:  MMT Right eval Left eval  Hip flexion 4+/5 5/5  Hip extension    Hip abduction  4+/5  Hip adduction    Hip  internal rotation    Hip external rotation    Knee flexion    Knee extension 5/5   Ankle dorsiflexion 5/5   Ankle plantarflexion    Ankle inversion    Ankle eversion    (Blank rows = not tested)  BED MOBILITY:  Sit to supine Complete Independence Supine to sit Complete Independence Rolling to Right Complete Independence Rolling to Left Complete Independence  TRANSFERS: Sit to stand: Modified independence Stand to sit: Modified independence  GAIT: Gait pattern: step to pattern, decreased step length- Right, decreased step length- Left, decreased stance time- Right, decreased stance time- Left, decreased stride length, decreased hip/knee flexion- Right, decreased hip/knee flexion- Left, decreased ankle dorsiflexion- Right, shuffling, poor foot clearance- Right, and poor foot clearance- Left Distance walked: various clinic distances Assistive device utilized: Walker - 2 wheeled Level of assistance: Modified independence Gait velocity: 1.51 ft/sec Comments: Pt has consistent shortened stride and step to pattern.  FUNCTIONAL TESTS:  5 times sit to stand: 15.78 sec w/ BUE momentum into standing, instability in immediate standing 10 meter walk test: 21.87 sec = 0.46 m/sec OR 1.51 ft/sec AMPPRO - To be assessed.  CURRENT PROSTHETIC WEAR ASSESSMENT: Patient is independent with: skin check, residual limb care, care of non-amputated limb, prosthetic cleaning, ply sock cleaning, proper wear schedule/adjustment, and proper weight-bearing schedule/adjustment Patient is dependent with: correct ply sock adjustment Donning prosthesis: Complete Independence Doffing prosthesis: Complete Independence Prosthetic wear tolerance: 5 hrs/day, 7 days/week Prosthetic weight bearing tolerance: 30 minutes w/ 2WW before needing to sit  Edema: none significant Residual limb condition: mild scar invagination, well healed; purple diffuse color around lower 1/4th of residual limb (pt reports this has been  normal since changing suspension) Prosthetic description: K3 - suction suspension, polycentric hydraulic (Modular EBS knee), sach foot, fixed ankle K code/activity level with prosthetic use: Level 3  PATIENT SURVEYS:  None applicable to chief complaint                                                                                                                              TREATMENT DATE: 01/16/2025 -No BP cuff available at onset of session, BP assessed following arm bike as pt asymptomatic: Today's Vitals   01/16/25 1325  BP: (!) 142/55  Pulse: (!) 46   -Arm bike on level 5.0 x5 minutes forwards and x5 minutes backwards for postural awareness and aerobic challenge.  RAMP:  Level of Assistance: SBA and CGA Assistive device utilized: Walker - 2 wheeled Ramp Comments: Cues for sequencing and to improve approximation to 2WW.  Pt reports pain in L stance due to socket.  Performed x3.  CURB:  Level of Assistance: SBA Assistive device utilized: Environmental Consultant - 2 wheeled Curb Comments: Performed x2.  Cues for sequencing and approximation to curb step and 2WW for balance.  Pt reports pain w/ step off in L residual limb.  STAIRS:  Level of Assistance: SBA  Stair Negotiation Technique: Step to Pattern Forwards with Single Rail on Right  Number of Stairs: 4x3   Height of Stairs: 6  Comments: Cues to improve upright, bringing hips forward onto prosthetic during descent and mechanics of prosthetic knee to be mindful of.  GAIT: Gait pattern: step to pattern, step through pattern, decreased step length- Right, decreased stride length, Left hip hike, antalgic, decreased trunk rotation, trunk flexed, and narrow BOS Distance walked: various clinic distances Assistive device utilized: Walker - 2 wheeled Level of assistance: SBA Comments: Cues to work on increased stride by clearing RLE past LLE during normal gait at home w/ 2WW and to work on bringing hips through in stance to improve posture during  ambulation.   PATIENT EDUCATION: Education details: BP parameters for therapy - monitor at home (he has monitor), checking blood glucose, continue HEP and sink HEP to help with new prosthetic tolerance.  Wearing prosthetic to improve tolerance. Person educated: Patient Education method: Explanation, Demonstration,  and Handouts Education comprehension: verbalized understanding and needs further education  HOME EXERCISE PROGRAM: Access Code: [email or web address] URL: [email or web address] Date: 05/09/2024 Prepared by: Daved Bull  Exercises - Sidelying Hip Abduction wtih Flexion and Extension (BKA)  - 1 x daily - 7 x weekly - 3 sets - 10 reps - Sidelying Hip Abduction (AKA)  - 1 x daily - 7 x weekly - 3 sets - 10 reps - Sidelying Hip Circles (AKA)  - 1 x daily - 7 x weekly - 3 sets - 10 reps - Supine Psoas Stretch (AKA)  - 1 x daily - 7 x weekly - 3 sets - 10 reps  Do each exercise 1-2  times per day Do each exercise 10 repetitions Hold each exercise for 2 seconds to feel your location  AT SINK FIND YOUR MIDLINE POSITION AND PLACE FEET EQUAL DISTANCE FROM THE MIDLINE.  Try to find this position when standing still for activities.   USE TAPE ON FLOOR TO MARK THE MIDLINE POSITION. You also should try to feel with your limb pressure in socket.  You are trying to feel with limb what you used to feel with the bottom of your foot.  Side to Side Shift: Moving your hips only (not shoulders): move weight onto your left leg, HOLD/FEEL.  Move back to equal weight on each leg, HOLD/FEEL. Move weight onto your right leg, HOLD/FEEL. Move back to equal weight on each leg, HOLD/FEEL. Repeat.  Start with both hands on sink, progress to right hand only, then no hands.  Front to Back Shift: Moving your hips only (not shoulders): move your weight forward onto your toes, HOLD/FEEL. Move your weight back to equal Flat Foot on both legs, HOLD/FEEL. Move your weight back onto your heels, HOLD/FEEL. Move  your weight back to equal on both legs, HOLD/FEEL. Repeat.   tart with both hands on sink, progress to right hand only, then no hands. Moving Cones / Cups: With equal weight on each leg: Hold on with one hand the first time, then progress to no hand supports. Move cups from one side of sink to the other. Place cups ~2 out of your reach, progress to 10 beyond reach.  Place one hand in middle of sink and reach with other hand. Do both arms.  Then hover one hand and move cups with other hand.  Overhead/Upward Reaching: alternated reaching up to top cabinets or ceiling if no cabinets present. Keep equal weight on each leg. Start with one hand support on counter while other hand reaches and progress to no hand support with reaching.  ace one hand in middle of sink and reach with other hand. Do both arms.  Then hover one hand and move cups with other hand.  5.   Looking Over Shoulders: With equal weight on each leg: alternate turning to look over your shoulders with one hand support on counter as needed.  Start with head motions only to look in front of shoulder, then even with shoulder and progress to looking behind you. To look to side, move head /eyes, then shoulder on side looking pulls back, shift more weight to side looking and pull hip back. ace one hand in middle of sink and let go with other hand so your shoulder can pull back. Switch hands to look other way.   Then hover one hand and move cups with other hand.  6.  Stepping with leg that is not amputated:  Move items under cabinet out of your way.  Shift your hips/pelvis so weight on prosthesis. SLOWLY step other leg so front of foot is in cabinet. Then step back to floor.    ASSESSMENT:  CLINICAL IMPRESSION: Focus of skilled session today on working on prosthetic stance mechanics with functional gait tasks.  He continues to have antalgic gait, but is able to improve mechanics with various cues.  He has some hesitancy with prior well performed ramp,  stairs, and curb step tasks due to ongoing pain, but no immediate instability noted.  He continues to benefit from practice with these tasks as he was previously at a cane level modified independently and demonstrates the potential to return to cane use vs current 2WW reliance.  Continue per POC.    OBJECTIVE IMPAIRMENTS: Abnormal gait, decreased activity tolerance, decreased balance, decreased endurance, decreased knowledge of use of DME, decreased mobility, difficulty walking, decreased strength, decreased safety awareness, impaired vision/preception, improper body mechanics, postural dysfunction, and prosthetic dependency .   ACTIVITY LIMITATIONS: carrying, lifting, bending, standing, squatting, stairs, transfers, and locomotion level  PARTICIPATION LIMITATIONS: community activity, occupation, and yard work  PERSONAL FACTORS: Past/current experiences, Time since onset of injury/illness/exacerbation, and 3+ comorbidities: lacunar infarction, HTN, diabetic retinopathy are also affecting patient's functional outcome.   REHAB POTENTIAL: Good  CLINICAL DECISION MAKING: Evolving/moderate complexity  EVALUATION COMPLEXITY: Moderate   GOALS: Goals reviewed with patient? Yes  SHORT TERM GOALS: Target date: 01/30/2025  Pt will be be IND and able to tolerate prior established prosthetic management, strengthening and standing balance HEP with utilization of new prosthetic in order to maintain functional progress and improve mobility. Baseline:  Encouraged to resume on eval. Goal status: INITIAL  2.  Pt will be independent in sock ply adjustment in order to improve wear tolerance and prosthetic fit. Baseline: wearing socks, needing ongoing edu with new suspension type Goal status: INITIAL  3.  Pt will rise to standing w/o LOB and requiring no UE support for immediate standing balance using new prosthetic. Baseline:  instability in immediate standing/posterior bias Goal status: INITIAL  4.  Pt  will demonstrate a gait speed of >/=1.71 feet/sec in order to decrease risk for falls. Baseline: 1.51 ft/sec Goal status: INITIAL  5.  Pt will improve AMPPRO score to >/=37/47 in order to demonstrate improved functional capacity with new prosthetic componentry. Baseline: 32/47 (2/4) Goal status: INITIAL  LONG TERM GOALS: Target date: 02/27/2025  1.   Pt will decrease 5xSTS to </=12 seconds in order to demonstrate decreased risk for falls and improved functional bilateral LE strength and power. Baseline: 15.78 sec w/ BUE momentum into standing Goal status: INITIAL  3.  Pt will demonstrate a gait speed of >/=1.91 feet/sec in order to decrease risk for falls. Baseline: 1.51 ft/sec Goal status: INITIAL  4.  Pt will improve AMPPRO score to >/=42/47 in order to demonstrate improved functional capacity with new prosthetic componentry. Baseline: 32/47 (2/4) Goal status: INITIAL  5.  Pt will navigate 4 stairs using step-to pattern and bilateral rails at mod I level to demonstrate safety in home environment. Baseline: pt stumbled out doorway attempting stairs w/ new prosthesis Goal status: INITIAL  6.  Pt will return to use of standalone cane over unlevel surfaces >/=250 ft in order to demonstrate return to prior level of function and improved tolerance of new prosthetic componentry. Baseline: Unable to use cane w/ new prosthetic due to instability Goal status: INITIAL   PLAN:  PT FREQUENCY: 2x/week  PT DURATION: 8 weeks  PLANNED INTERVENTIONS: [id number]- PT Re-evaluation,  [id number]- Physical Performance Testing, 97110-Therapeutic exercises, 97530- Therapeutic activity, [id number]- Neuromuscular re-education, [id number]- Self Care, [id number]- Manual therapy, [id number]- Gait training, [id number]- Prosthetic Initial , [id number]- Orthotic/Prosthetic subsequent, [id number]- Aquatic Therapy, [phone]- Electrical stimulation (manual), [id number] (1-2 muscles), [id number] (3+ muscles)- Dry Needling, Patient/Family education, Balance training, Stair  training, Taping, Joint mobilization, Vestibular training, DME instructions, Cryotherapy, and Moist heat  PLAN FOR NEXT SESSION:  Single leg bridges, work towards cane, How is HEP?  Ramp and unlevel surfaces, stairs, long distance ambulation.  Lifting/pushing/pulling.  Step overs.  Prosthetic weight acceptance.  Picking up from floor/forward reaching.  Step through gait.  Pt requests to resume arm bike as it helped his posture.  Daved KATHEE Bull, PT, DPT 01/16/2025, 2:06 PM    "

## 2025-01-28 ENCOUNTER — Ambulatory Visit: Admitting: Physical Therapy

## 2025-01-30 ENCOUNTER — Ambulatory Visit: Admitting: Physical Therapy

## 2025-02-04 ENCOUNTER — Ambulatory Visit: Admitting: Physical Therapy

## 2025-02-06 ENCOUNTER — Ambulatory Visit: Admitting: Physical Therapy

## 2025-02-11 ENCOUNTER — Ambulatory Visit: Attending: Cardiology | Admitting: Physical Therapy

## 2025-02-13 ENCOUNTER — Ambulatory Visit (INDEPENDENT_AMBULATORY_CARE_PROVIDER_SITE_OTHER): Admitting: Vascular Surgery

## 2025-02-13 ENCOUNTER — Encounter (INDEPENDENT_AMBULATORY_CARE_PROVIDER_SITE_OTHER)

## 2025-02-13 ENCOUNTER — Ambulatory Visit: Admitting: Physical Therapy

## 2025-02-18 ENCOUNTER — Ambulatory Visit: Admitting: Physical Therapy

## 2025-02-20 ENCOUNTER — Ambulatory Visit: Admitting: Physical Therapy

## 2025-02-25 ENCOUNTER — Ambulatory Visit: Admitting: Physical Therapy

## 2025-02-27 ENCOUNTER — Ambulatory Visit: Admitting: Cardiology

## 2025-02-27 ENCOUNTER — Ambulatory Visit: Admitting: Physical Therapy

## 2025-03-11 ENCOUNTER — Ambulatory Visit: Admitting: Cardiology
